# Patient Record
Sex: Female | Born: 1940 | Race: White | Hispanic: No | Smoking: Former smoker
Health system: Southern US, Community
[De-identification: ages and names within clinical notes are randomized; demographics above are authoritative.]

## PROBLEM LIST (undated history)

## (undated) DIAGNOSIS — R001 Bradycardia, unspecified: Secondary | ICD-10-CM

## (undated) DIAGNOSIS — J449 Chronic obstructive pulmonary disease, unspecified: Secondary | ICD-10-CM

## (undated) DIAGNOSIS — Z9861 Coronary angioplasty status: Secondary | ICD-10-CM

## (undated) DIAGNOSIS — I2510 Atherosclerotic heart disease of native coronary artery without angina pectoris: Secondary | ICD-10-CM

## (undated) DIAGNOSIS — I779 Disorder of arteries and arterioles, unspecified: Secondary | ICD-10-CM

## (undated) DIAGNOSIS — E119 Type 2 diabetes mellitus without complications: Secondary | ICD-10-CM

## (undated) DIAGNOSIS — I10 Essential (primary) hypertension: Secondary | ICD-10-CM

## (undated) DIAGNOSIS — E785 Hyperlipidemia, unspecified: Secondary | ICD-10-CM

## (undated) DIAGNOSIS — I739 Peripheral vascular disease, unspecified: Secondary | ICD-10-CM

## (undated) DIAGNOSIS — I429 Cardiomyopathy, unspecified: Secondary | ICD-10-CM

## (undated) DIAGNOSIS — I482 Chronic atrial fibrillation, unspecified: Secondary | ICD-10-CM

## (undated) DIAGNOSIS — E039 Hypothyroidism, unspecified: Secondary | ICD-10-CM

## (undated) HISTORY — DX: Hypothyroidism, unspecified: E03.9

## (undated) HISTORY — DX: Disorder of arteries and arterioles, unspecified: I77.9

## (undated) HISTORY — DX: Type 2 diabetes mellitus without complications: E11.9

## (undated) HISTORY — PX: CHOLECYSTECTOMY: SHX55

## (undated) HISTORY — PX: ABDOMINAL HYSTERECTOMY: SHX81

## (undated) HISTORY — DX: Essential (primary) hypertension: I10

## (undated) HISTORY — DX: Peripheral vascular disease, unspecified: I73.9

## (undated) HISTORY — DX: Hyperlipidemia, unspecified: E78.5

## (undated) HISTORY — DX: Chronic obstructive pulmonary disease, unspecified: J44.9

## (undated) HISTORY — PX: APPENDECTOMY: SHX54

## (undated) HISTORY — DX: Cardiomyopathy, unspecified: I42.9

---

## 2000-07-25 ENCOUNTER — Encounter (INDEPENDENT_AMBULATORY_CARE_PROVIDER_SITE_OTHER): Payer: Self-pay | Admitting: *Deleted

## 2000-07-25 ENCOUNTER — Encounter (INDEPENDENT_AMBULATORY_CARE_PROVIDER_SITE_OTHER): Payer: Self-pay | Admitting: Specialist

## 2000-07-25 ENCOUNTER — Ambulatory Visit (HOSPITAL_COMMUNITY): Admission: RE | Admit: 2000-07-25 | Discharge: 2000-07-25 | Payer: Self-pay | Admitting: Neurosurgery

## 2000-07-25 ENCOUNTER — Encounter: Payer: Self-pay | Admitting: Neurosurgery

## 2003-04-09 ENCOUNTER — Inpatient Hospital Stay (HOSPITAL_COMMUNITY): Admission: AD | Admit: 2003-04-09 | Discharge: 2003-04-11 | Payer: Self-pay | Admitting: Cardiology

## 2004-06-08 ENCOUNTER — Ambulatory Visit: Payer: Self-pay | Admitting: Cardiology

## 2005-01-13 ENCOUNTER — Ambulatory Visit: Payer: Self-pay | Admitting: Cardiology

## 2005-07-20 ENCOUNTER — Ambulatory Visit: Payer: Self-pay | Admitting: Cardiology

## 2006-01-13 ENCOUNTER — Ambulatory Visit: Payer: Self-pay | Admitting: Cardiology

## 2006-08-17 ENCOUNTER — Ambulatory Visit: Payer: Self-pay | Admitting: Cardiology

## 2007-03-27 ENCOUNTER — Ambulatory Visit: Payer: Self-pay | Admitting: Cardiology

## 2007-04-02 ENCOUNTER — Ambulatory Visit: Payer: Self-pay | Admitting: Cardiology

## 2007-05-01 ENCOUNTER — Ambulatory Visit: Payer: Self-pay | Admitting: Cardiology

## 2007-07-31 ENCOUNTER — Encounter: Payer: Self-pay | Admitting: Cardiology

## 2007-11-12 ENCOUNTER — Encounter: Payer: Self-pay | Admitting: Cardiology

## 2008-02-01 ENCOUNTER — Ambulatory Visit: Payer: Self-pay | Admitting: Cardiology

## 2008-09-09 ENCOUNTER — Ambulatory Visit: Payer: Self-pay | Admitting: Cardiology

## 2008-11-20 DIAGNOSIS — Z9861 Coronary angioplasty status: Secondary | ICD-10-CM

## 2008-11-20 DIAGNOSIS — I10 Essential (primary) hypertension: Secondary | ICD-10-CM

## 2008-11-20 DIAGNOSIS — E785 Hyperlipidemia, unspecified: Secondary | ICD-10-CM

## 2008-11-20 DIAGNOSIS — I2510 Atherosclerotic heart disease of native coronary artery without angina pectoris: Secondary | ICD-10-CM

## 2009-03-12 ENCOUNTER — Encounter: Payer: Self-pay | Admitting: Cardiology

## 2009-03-13 ENCOUNTER — Ambulatory Visit: Payer: Self-pay | Admitting: Cardiology

## 2009-03-13 ENCOUNTER — Encounter: Payer: Self-pay | Admitting: Cardiology

## 2009-03-17 ENCOUNTER — Encounter (INDEPENDENT_AMBULATORY_CARE_PROVIDER_SITE_OTHER): Payer: Self-pay | Admitting: *Deleted

## 2009-03-17 DIAGNOSIS — R002 Palpitations: Secondary | ICD-10-CM

## 2009-03-17 DIAGNOSIS — R072 Precordial pain: Secondary | ICD-10-CM | POA: Insufficient documentation

## 2009-04-01 ENCOUNTER — Ambulatory Visit: Payer: Self-pay | Admitting: Cardiology

## 2009-04-02 ENCOUNTER — Encounter: Payer: Self-pay | Admitting: Cardiology

## 2009-04-09 ENCOUNTER — Ambulatory Visit: Payer: Self-pay | Admitting: Cardiology

## 2009-04-09 DIAGNOSIS — I6529 Occlusion and stenosis of unspecified carotid artery: Secondary | ICD-10-CM

## 2009-04-09 DIAGNOSIS — J441 Chronic obstructive pulmonary disease with (acute) exacerbation: Secondary | ICD-10-CM

## 2009-04-09 DIAGNOSIS — F17200 Nicotine dependence, unspecified, uncomplicated: Secondary | ICD-10-CM

## 2009-06-29 ENCOUNTER — Encounter: Payer: Self-pay | Admitting: Cardiology

## 2009-06-29 ENCOUNTER — Ambulatory Visit: Payer: Self-pay | Admitting: Cardiology

## 2009-06-30 ENCOUNTER — Encounter: Payer: Self-pay | Admitting: Cardiology

## 2009-07-17 ENCOUNTER — Ambulatory Visit: Payer: Self-pay | Admitting: Cardiology

## 2009-08-10 ENCOUNTER — Encounter: Payer: Self-pay | Admitting: Physician Assistant

## 2009-08-10 ENCOUNTER — Encounter: Payer: Self-pay | Admitting: Cardiology

## 2009-08-10 ENCOUNTER — Ambulatory Visit: Payer: Self-pay | Admitting: Cardiology

## 2009-08-27 ENCOUNTER — Ambulatory Visit: Payer: Self-pay | Admitting: Cardiology

## 2010-01-14 ENCOUNTER — Ambulatory Visit: Payer: Self-pay | Admitting: Cardiology

## 2010-03-30 NOTE — Assessment & Plan Note (Signed)
Summary: 6 MO FUL FHOLT   Visit Type:  Follow-up Primary Provider:  Sherril Croon   History of Present Illness: the patient is a 70 year old female with a history paroxysmal atrial fibrillation currently normal sinus rhythm. The patient has declined Coumadin in the past despite the fact that she such high risk for trauma while in disease. She presents for followup.the patient also has a history of COPD. No palpitations. lumgs improved. Tobacco 8 cigrattes. No dizziness, no syncope.feels BP goes stress related.ho CAD sent 2005 but declines stress test. Declines again coumadin. Walking does causes thightness. Uses inhaler feels related to lungs.  Preventive Screening-Counseling & Management  Alcohol-Tobacco     Smoking Status: current     Smoking Cessation Counseling: yes     Packs/Day: 1/2 PPD  Current Medications (verified): 1)  Crestor 10 Mg Tabs (Rosuvastatin Calcium) .... Take 1 Tablet By Mouth At Bedtime 2)  Potassium Chloride Crys Cr 20 Meq Cr-Tabs (Potassium Chloride Crys Cr) .... Take 1 Tablet By Mouth Once A Day 3)  Hydrochlorothiazide 12.5 Mg Tabs (Hydrochlorothiazide) .... Take 1 Tablet By Mouth Once A Day 4)  Isosorbide Mononitrate Cr 30 Mg Xr24h-Tab (Isosorbide Mononitrate) .... Take 1 Tablet By Mouth Once A Day 5)  Cardizem Cd 240 Mg Xr24h-Cap (Diltiazem Hcl Coated Beads) .... Take 1 Tablet By Mouth Once A Day 6)  Levothroid 100 Mcg Tabs (Levothyroxine Sodium) .... Take 1 Tablet By Mouth Once A Day 7)  Lisinopril 40 Mg Tabs (Lisinopril) .... Take 1 Tablet By Mouth Once A Day 8)  Aspirin 325 Mg Tabs (Aspirin) .... Take 1 Tablet By Mouth Once A Day 9)  Proair Hfa 108 (90 Base) Mcg/act Aers (Albuterol Sulfate) .... As Needed 10)  Glipizide 5 Mg Tabs (Glipizide) .... Take 1 Tablet By Mouth Once A Day 11)  Omeprazole 40 Mg Cpdr (Omeprazole) .... Take 1 Tablet By Mouth Once A Day 12)  Diazepam 5 Mg Tabs (Diazepam) .... Take 1/2-1 Tablet By Mouth Three Times A Day As Needed 13)  Albuterol  Sulfate (2.5 Mg/63ml) 0.083% Nebu (Albuterol Sulfate) .... Use As Directed 14)  Caltrate 600+d 600-400 Mg-Unit Tabs (Calcium Carbonate-Vitamin D) .... Take 1 Tablet By Mouth Two Times A Day 15)  Hydrocodone-Acetaminophen 5-500 Mg Tabs (Hydrocodone-Acetaminophen) .... Take 1 Tablet By Mouth Two Times A Day As Needed 16)  Ipratropium Bromide 0.02 % Soln (Ipratropium Bromide) .... Use As Directed 17)  Nitrostat 0.4 Mg Subl (Nitroglycerin) .... Use As Directed 18)  Drisdol [address] Unit Caps (Ergocalciferol) .... Take One By Mouth Weekly  Allergies (verified): 1)  ! Codeine  Comments:  Nurse/Medical Assistant: The patient's medication bottles and allergies were reviewed with the patient and were updated in the Medication and Allergy Lists.  Past History:  Past Medical History: Last updated: 07/17/2009 HYPERTENSION, UNSPECIFIED (ICD-401.9) HYPERLIPIDEMIA-MIXED (ICD-272.4) CAD, NATIVE VESSEL (ICD-414.01) Cerebrovascular disease Hypothyroidism COPD drug alluding stent to the right coronary February 2005., normal LV function. History of atrial fibrillation and atrial flutter  Family History: Last updated: 01/14/2010 noncontributory Negative FH of Diabetes, Hypertension, or Coronary Artery Disease  Family History: noncontributory Negative FH of Diabetes, Hypertension, or Coronary Artery Disease  Review of Systems       The patient complains of shortness of breath and prolonged cough.  The patient denies fatigue, malaise, fever, weight gain/loss, vision loss, decreased hearing, hoarseness, chest pain, palpitations, wheezing, sleep apnea, coughing up blood, abdominal pain, blood in stool, nausea, vomiting, diarrhea, heartburn, incontinence, blood in urine, muscle weakness, joint pain, leg swelling,  rash, skin lesions, headache, fainting, dizziness, depression, anxiety, enlarged lymph nodes, easy bruising or bleeding, and environmental allergies.    Vital Signs:  Patient profile:   70 year  old female Height:      62 inches Weight:      158 pounds BMI:     29.00 Pulse rate:   66 / minute BP sitting:   112 / 64  (left arm) Cuff size:   regular  Vitals Entered By: Carlye Grippe (January 14, 2010 1:48 PM)  Nutrition Counseling: Patient's BMI is greater than 25 and therefore counseled on weight management options.   Physical Exam  Additional Exam:  General: Well-developed, well-nourished in no distress head: Normocephalic and atraumatic eyes PERRLA/EOMI intact, conjunctiva and lids normal nose: No deformity or lesions mouth normal dentition, normal posterior pharynx neck: Supple, no JVD.  No masses, thyromegaly or abnormal cervical nodes lungs: diminished breath sounds bilateral wheezing..  Normal percussion heart: regular rate and rhythm with normal S1 and S2, no S3 or S4.  PMI is normal.  No pathological murmurs abdomen: Normal bowel sounds, abdomen is soft and nontender without masses, organomegaly or hernias noted.  No hepatosplenomegaly musculoskeletal: Back normal, normal gait muscle strength and tone normal pulsus: Pulse is normal in all 4 extremities Extremities: No peripheral pitting edema neurologic: Alert and oriented x 3 skin: Intact without lesions or rashes cervical nodes: No significant adenopathy psychologic: Normal affect    Impression & Recommendations:  Problem # 1:  ATRIAL FIBRILLATION (ICD-427.31) patient has a history of proximal intrafibrillation.  She is currently normal sinus rhythm.  She continues to decline Coumadin despite high risk features. Her updated medication list for this problem includes:    Aspirin 325 Mg Tabs (Aspirin) .Marland Kitchen... Take 1 tablet by mouth once a day  Orders: EKG w/ Interpretation (93000)  Problem # 2:  CAD, NATIVE VESSEL (ICD-414.01) history of coronary artery disease the patient declines a follow-up stress test.  She has stable symptoms however. Her updated medication list for this problem includes:     Isosorbide Mononitrate Cr 30 Mg Xr24h-tab (Isosorbide mononitrate) .Marland Kitchen... Take 1 tablet by mouth once a day    Cardizem Cd 240 Mg Xr24h-cap (Diltiazem hcl coated beads) .Marland Kitchen... Take 1 tablet by mouth once a day    Lisinopril 40 Mg Tabs (Lisinopril) .Marland Kitchen... Take 1 tablet by mouth once a day    Aspirin 325 Mg Tabs (Aspirin) .Marland Kitchen... Take 1 tablet by mouth once a day    Nitrostat 0.4 Mg Subl (Nitroglycerin) ..... Use as directed  Problem # 3:  COPD (ICD-496) patient has significant dyspnea for which he uses inhalers. The following medications were removed from the medication list:    Albuterol Sulfate (5 Mg/ml) 0.5% Nebu (Albuterol sulfate) .Marland Kitchen... As needed Her updated medication list for this problem includes:    Proair Hfa 108 (90 Base) Mcg/act Aers (Albuterol sulfate) .Marland Kitchen... As needed    Albuterol Sulfate (2.5 Mg/5ml) 0.083% Nebu (Albuterol sulfate) ..... Use as directed    Ipratropium Bromide 0.02 % Soln (Ipratropium bromide) ..... Use as directed  Patient Instructions: 1)  Your physician recommends that you continue on your current medications as directed. Please refer to the Current Medication list given to you today. 2)  Follow up in  6 months

## 2010-03-30 NOTE — Letter (Signed)
Summary: [facility] D/C DR. DHRUV VYAS  [facility] D[facility]C DR. DHRUV VYAS   Imported By: Zachary George 07/16/2009 12:44:38  _____________________________________________________________________  External Attachment:    Type:   Image     Comment:   External Document

## 2010-03-30 NOTE — Assessment & Plan Note (Signed)
Summary: 6 MO FU PER JAN REMINDERS-RS   Visit Type:  Follow-up Primary Provider:  Sherril Croon  CC:  follow-up visit.  History of Present Illness: the patient is a 70 year old female with a history of coronary disease status post drug-eluting stent placement to the RCA 2005. The patient continues to use tobacco. She also has carotid artery disease. She has COPD and a significant wheezing on exam today. The patient declined a stress test her last office visit. She denies any chest pain shortness of breath or syncope she reports no palpitations. The patient wore a 21 day monitor and there was no evidence of atrial fibrillation.   Preventive Screening-Counseling & Management  Alcohol-Tobacco     Smoking Status: current     Smoking Cessation Counseling: yes     Packs/Day: 1/2 PPD  Current Problems (verified): 1)  Chest Pain, Precordial  (ICD-786.51) 2)  Palpitations  (ICD-785.1) 3)  Hypertension, Unspecified  (ICD-401.9) 4)  Hyperlipidemia-mixed  (ICD-272.4) 5)  Cad, Native Vessel  (ICD-414.01)  Current Medications (verified): 1)  Crestor 10 Mg Tabs (Rosuvastatin Calcium) .... Take 1 Tablet By Mouth At Bedtime 2)  Potassium Chloride Crys Cr 20 Meq Cr-Tabs (Potassium Chloride Crys Cr) .... Take 1 Tablet By Mouth Once A Day 3)  Hydrochlorothiazide 12.5 Mg Tabs (Hydrochlorothiazide) .... Take 1 Tablet By Mouth Once A Day 4)  Isosorbide Mononitrate Cr 30 Mg Xr24h-Tab (Isosorbide Mononitrate) .... Take 1 Tablet By Mouth Once A Day 5)  Cardizem Cd 240 Mg Xr24h-Cap (Diltiazem Hcl Coated Beads) .... Take 1 Tablet By Mouth Once A Day 6)  Levothroid 100 Mcg Tabs (Levothyroxine Sodium) .... Take 1 Tablet By Mouth Once A Day 7)  Lisinopril 40 Mg Tabs (Lisinopril) .... Take 1 Tablet By Mouth Once A Day 8)  Aspir-Low 81 Mg Tbec (Aspirin) .... Take 1 Tablet By Mouth Once A Day 9)  Proventil Hfa 108 (90 Base) Mcg/act Aers (Albuterol Sulfate) .... As Needed 10)  Glipizide 5 Mg Tabs (Glipizide) .... Take 1  Tablet By Mouth Once A Day 11)  Oxazepam 10 Mg Caps (Oxazepam) .... Take 1 Tablet By Mouth Once A Day As Needed 12)  Omeprazole 40 Mg Cpdr (Omeprazole) .... Take 1 Tablet By Mouth Once A Day 13)  Diazepam 5 Mg Tabs (Diazepam) .... Take 1/2-1 Tablet By Mouth Three Times A Day As Needed 14)  Nicotine 21 Mg/24hr Pt24 (Nicotine) .... Apply One Patch Daily X 7 Days, Then Step Down To 14mg  Patch 15)  Nicotine 14 Mg/24hr Pt24 (Nicotine) .... Apply One Patch Daily X 7 Days, Then Step Down To 7mg  16)  Nicotine 7 Mg/24hr Pt24 (Nicotine) .... Apply One Patch Daily, Then Stop  Allergies (verified): 1)  ! Codeine  Comments:  Nurse/Medical Assistant: The patient's medications and allergies were reviewed with the patient and were updated in the Medication and Allergy Lists. Bottles reviewed.  Past History:  Past Medical History: Last updated: 11/20/2008 HYPERTENSION, UNSPECIFIED (ICD-401.9) HYPERLIPIDEMIA-MIXED (ICD-272.4) CAD, NATIVE VESSEL (ICD-414.01) Cerebrovascular disease Hypothyroidism COPD  Family History: Reviewed history and no changes required. noncontributory  Social History: Reviewed history from 11/20/2008 and no changes required. Tobacco Use - Yes.  Packs/Day:  1/2 PPD  Review of Systems       The patient complains of wheezing.  The patient denies fatigue, malaise, fever, weight gain/loss, vision loss, decreased hearing, hoarseness, chest pain, palpitations, prolonged cough, sleep apnea, coughing up blood, abdominal pain, blood in stool, nausea, vomiting, diarrhea, heartburn, incontinence, blood in urine, muscle weakness, joint pain,  leg swelling, rash, skin lesions, headache, fainting, dizziness, depression, anxiety, enlarged lymph nodes, easy bruising or bleeding, and environmental allergies.    Vital Signs:  Patient profile:   70 year old female Height:      62 inches Weight:      152 pounds BMI:     27.90 O2 Sat:      94 % Pulse rate:   61 / minute BP sitting:    113 / 68  (left arm) Cuff size:   regular  Vitals Entered By: Carlye Grippe (April 09, 2009 1:24 PM)  Nutrition Counseling: Patient's BMI is greater than 25 and therefore counseled on weight management options. CC: follow-up visit   Physical Exam  Additional Exam:  General: Well-developed, well-nourished in no distress head: Normocephalic and atraumatic eyes PERRLA/EOMI intact, conjunctiva and lids normal nose: No deformity or lesions mouth normal dentition, normal posterior pharynx neck: Supple, no JVD.  No masses, thyromegaly or abnormal cervical nodes lungs: diminished breath sounds bilateral wheezing..  Normal percussion heart: regular rate and rhythm with normal S1 and S2, no S3 or S4.  PMI is normal.  No pathological murmurs abdomen: Normal bowel sounds, abdomen is soft and nontender without masses, organomegaly or hernias noted.  No hepatosplenomegaly musculoskeletal: Back normal, normal gait muscle strength and tone normal pulsus: Pulse is normal in all 4 extremities Extremities: No peripheral pitting edema neurologic: Alert and oriented x 3 skin: Intact without lesions or rashes cervical nodes: No significant adenopathy psychologic: Normal affect    Impression & Recommendations:  Problem # 1:  CAD, NATIVE VESSEL (ICD-414.01) the patient denies any chest pain. The patient remains compliant with her medical regimen. She has declined a stress test. Her updated medication list for this problem includes:    Isosorbide Mononitrate Cr 30 Mg Xr24h-tab (Isosorbide mononitrate) .Marland Kitchen... Take 1 tablet by mouth once a day    Cardizem Cd 240 Mg Xr24h-cap (Diltiazem hcl coated beads) .Marland Kitchen... Take 1 tablet by mouth once a day    Lisinopril 40 Mg Tabs (Lisinopril) .Marland Kitchen... Take 1 tablet by mouth once a day    Aspir-low 81 Mg Tbec (Aspirin) .Marland Kitchen... Take 1 tablet by mouth once a day  Problem # 2:  TOBACCO ABUSE (ICD-305.1) the patient's abdominal smoking to 7-8 cigarettes a day. I counseled  her extensively regarding discontinuation. The patient is willing to try nicotine patches. I have given her prescription.  Problem # 3:  CAROTID ARTERY DISEASE (ICD-433.10) no definite bruits on exam. I do not think Dopplers are indicated currently Her updated medication list for this problem includes:    Aspir-low 81 Mg Tbec (Aspirin) .Marland Kitchen... Take 1 tablet by mouth once a day  Problem # 4:  COPD (ICD-496) the patient has significant wheezing on exam. Again I counseled her regarding her tobacco use and asked her to use her inhalers. Her updated medication list for this problem includes:    Proventil Hfa 108 (90 Base) Mcg/act Aers (Albuterol sulfate) .Marland Kitchen... As needed  Patient Instructions: 1)  Nicotine patch 2)  Follow up in  6 months. Prescriptions: NICOTINE 7 MG/24HR PT24 (NICOTINE) apply one patch daily, then stop  #7 x 0   Entered by:   Hoover Brunette, LPN   Authorized by:   Lewayne Bunting, MD, [facility]   Signed by:   Hoover Brunette, LPN on 41/32/4401   Method used:   Electronically to        [facility]* (retail)       [address]  [address]       [place], [place]  [address]       Ph: [phone]       Fax: [phone]   RxID:   [id number] NICOTINE 14 MG/24HR PT24 (NICOTINE) apply one patch daily x 7 days, then step down to 7mg   #7 x 0   Entered by:   Hoover Brunette, LPN   Authorized by:   Lewayne Bunting, MD, [facility]   Signed by:   Hoover Brunette, LPN on 84/13/2440   Method used:   Electronically to        [facility]* (retail)       [address]       [place], [place]  [address]       Ph: [phone]       Fax: [phone]   RxID:   [id number] NICOTINE 21 MG/24HR PT24 (NICOTINE) apply one patch daily x 7 days, then step down to 14mg  patch  #7 x 0   Entered by:   Hoover Brunette, LPN   Authorized by:   Lewayne Bunting, MD, [facility]   Signed by:   Hoover Brunette, LPN on 30/16/0109   Method used:   Electronically to        [facility]* (retail)       [address]       [place], [place]  [address]       Ph: [phone]       Fax: [phone]   RxID:   [id number]

## 2010-03-30 NOTE — Consult Note (Signed)
Summary: [facility]ARDIOLOGY CONSULT/ [facility]  CARDIOLOGY CONSULT/ [facility]   Imported By: Zachary George 07/16/2009 12:44:12  _____________________________________________________________________  External Attachment:    Type:   Image     Comment:   External Document

## 2010-03-30 NOTE — Letter (Signed)
Summary: [facility] H&P/D/C DR. VYAS  [facility] H[facility]/D/C DR. VYAS   Imported By: Zachary George 04/09/2009 11:54:32  _____________________________________________________________________  External Attachment:    Type:   Image     Comment:   External Document

## 2010-03-30 NOTE — Assessment & Plan Note (Signed)
Summary: eph-d/c [facility] 06/30/2009   Visit Type:  hospital follow-up Primary Provider:  Sherril Croon  CC:  hospital follow-up visit.  History of Present Illness: the patient is a 70 year old female recently admitted with chest pain and palpitations. She was found to have a recurrent episode of paroxysmal atrial fibrillation. During hospitalization she was back restored to normal sinus rhythm. She has a history of cardiovascular disease with a drug-eluting stent to the right coronary artery in 2005. She has consistently declined noninvasive testing. She ruled out for myocardial infarction during this hospitalization. In the interim she has had no recurrent chest pain or heart palpitations. An echocardiogram demonstrated normal LV function, concentric left ventricular hypertrophy but no significant valvular abnormalities. The patient also has hypertension, dyslipidemia and diabetes mellitus. She has significant COPD and unfortunately continues to smoke.  The patient is at increased risk for thrombo-embolic disease related to her age her fibrillation. We had a long discussion regarding anticoagulation with Coumadin for dabigatran. The patient is not willing to start it at the present time but will consider the latter medication in the next 6 months. She again today declines noninvasive testing to evaluate her ischemic heart disease.  Preventive Screening-Counseling & Management  Alcohol-Tobacco     Smoking Status: current     Smoking Cessation Counseling: yes     Packs/Day: 1/2 PPD  Current Medications (verified): 1)  Crestor 10 Mg Tabs (Rosuvastatin Calcium) .... Take 1 Tablet By Mouth At Bedtime 2)  Potassium Chloride Crys Cr 20 Meq Cr-Tabs (Potassium Chloride Crys Cr) .... Take 1 Tablet By Mouth Once A Day 3)  Hydrochlorothiazide 12.5 Mg Tabs (Hydrochlorothiazide) .... Take 1 Tablet By Mouth Once A Day 4)  Isosorbide Mononitrate Cr 30 Mg Xr24h-Tab (Isosorbide Mononitrate) .... Take 1 Tablet By Mouth Once  A Day 5)  Cardizem Cd 240 Mg Xr24h-Cap (Diltiazem Hcl Coated Beads) .... Take 1 Tablet By Mouth Once A Day 6)  Levothroid 100 Mcg Tabs (Levothyroxine Sodium) .... Take 1 Tablet By Mouth Once A Day 7)  Lisinopril 40 Mg Tabs (Lisinopril) .... Take 1 Tablet By Mouth Once A Day 8)  Aspir-Low 81 Mg Tbec (Aspirin) .... Take 1 Tablet By Mouth Once A Day 9)  Proventil Hfa 108 (90 Base) Mcg/act Aers (Albuterol Sulfate) .... As Needed 10)  Glipizide 5 Mg Tabs (Glipizide) .... Take 1 Tablet By Mouth Once A Day 11)  Omeprazole 40 Mg Cpdr (Omeprazole) .... Take 1 Tablet By Mouth Once A Day 12)  Diazepam 5 Mg Tabs (Diazepam) .... Take 1/2-1 Tablet By Mouth Three Times A Day As Needed 13)  Nicotine 21 Mg/24hr Pt24 (Nicotine) .... Apply One Patch Daily X 7 Days, Then Step Down To 14mg  Patch 14)  Nicotine 14 Mg/24hr Pt24 (Nicotine) .... Apply One Patch Daily X 7 Days, Then Step Down To 7mg  15)  Nicotine 7 Mg/24hr Pt24 (Nicotine) .... Apply One Patch Daily, Then Stop 16)  Pulmo-Aide Comp/pulmo-Neb Disp  Devi (Respiratory Therapy Supplies) .... Use As Directed 17)  Albuterol Sulfate (2.5 Mg/102ml) 0.083% Nebu (Albuterol Sulfate) .... Use As Directed 18)  Caltrate 600+d 600-400 Mg-Unit Tabs (Calcium Carbonate-Vitamin D) .... Take 1 Tablet By Mouth Two Times A Day 19)  Hydrocodone-Acetaminophen 5-500 Mg Tabs (Hydrocodone-Acetaminophen) .... Take 1 Tablet By Mouth Two Times A Day As Needed 20)  Ipratropium Bromide 0.02 % Soln (Ipratropium Bromide) .... Use As Directed 21)  Nitrostat 0.4 Mg Subl (Nitroglycerin) .... Use As Directed  Allergies (verified): 1)  ! Codeine  Comments:  Nurse/Medical  Assistant: The patient's medications and allergies were reviewed with the patient and were updated in the Medication and Allergy Lists. List reviewed.  Past History:  Family History: Last updated: 04/09/2009 noncontributory  Social History: Last updated: 11/20/2008 Tobacco Use - Yes.   Past Medical  History: HYPERTENSION, UNSPECIFIED (ICD-401.9) HYPERLIPIDEMIA-MIXED (ICD-272.4) CAD, NATIVE VESSEL (ICD-414.01) Cerebrovascular disease Hypothyroidism COPD drug alluding stent to the right coronary February 2005., normal LV function. History of atrial fibrillation and atrial flutter  Review of Systems       The patient complains of palpitations.  The patient denies fatigue, malaise, fever, weight gain/loss, vision loss, decreased hearing, hoarseness, chest pain, shortness of breath, prolonged cough, wheezing, sleep apnea, coughing up blood, abdominal pain, blood in stool, nausea, vomiting, diarrhea, heartburn, incontinence, blood in urine, muscle weakness, joint pain, leg swelling, rash, skin lesions, headache, fainting, dizziness, depression, anxiety, enlarged lymph nodes, easy bruising or bleeding, and environmental allergies.    Vital Signs:  Patient profile:   70 year old female Height:      62 inches Weight:      157 pounds Pulse rate:   58 / minute BP sitting:   121 / 71  (left arm) Cuff size:   regular  Vitals Entered By: Carlye Grippe (Jul 17, 2009 8:25 AM) CC: hospital follow-up visit   Physical Exam  Additional Exam:  General: Well-developed, well-nourished in no distress head: Normocephalic and atraumatic eyes PERRLA/EOMI intact, conjunctiva and lids normal nose: No deformity or lesions mouth normal dentition, normal posterior pharynx neck: Supple, no JVD.  No masses, thyromegaly or abnormal cervical nodes lungs: diminished breath sounds bilateral wheezing..  Normal percussion heart: regular rate and rhythm with normal S1 and S2, no S3 or S4.  PMI is normal.  No pathological murmurs abdomen: Normal bowel sounds, abdomen is soft and nontender without masses, organomegaly or hernias noted.  No hepatosplenomegaly musculoskeletal: Back normal, normal gait muscle strength and tone normal pulsus: Pulse is normal in all 4 extremities Extremities: No peripheral pitting  edema neurologic: Alert and oriented x 3 skin: Intact without lesions or rashes cervical nodes: No significant adenopathy psychologic: Normal affect    Impression & Recommendations:  Problem # 1:  ATRIAL FIBRILLATION (ICD-427.31) patient was recently hospitalized with recurrent intrafibrillation.  She now remains in normal sinus rhythm.  She is at increased risk for thromboembolic disease.  She currently wants to hold off on Coumadin but is willing to consider doing next clinic visit.  I told her at increased risk for stroke and she should consider starting sooner. Her updated medication list for this problem includes:    Aspir-low 81 Mg Tbec (Aspirin) .Marland Kitchen... Take 1 tablet by mouth once a day  Problem # 2:  CAROTID ARTERY DISEASE (ICD-433.10) Assessment: Comment Only  Her updated medication list for this problem includes:    Aspir-low 81 Mg Tbec (Aspirin) .Marland Kitchen... Take 1 tablet by mouth once a day  Problem # 3:  TOBACCO ABUSE (ICD-305.1) the patient was asked regarding her tobacco use.  She still smokes half a pack a day.  Problem # 4:  CAD, NATIVE VESSEL (ICD-414.01) the patient has coronary artery disease but she ruled out during his recent hospitalization for myocardial infarction.  She status-post stent placement.  She denies however any chest pain. Her updated medication list for this problem includes:    Isosorbide Mononitrate Cr 30 Mg Xr24h-tab (Isosorbide mononitrate) .Marland Kitchen... Take 1 tablet by mouth once a day    Cardizem Cd 240 Mg Xr24h-cap (  Diltiazem hcl coated beads) .Marland Kitchen... Take 1 tablet by mouth once a day    Lisinopril 40 Mg Tabs (Lisinopril) .Marland Kitchen... Take 1 tablet by mouth once a day    Aspir-low 81 Mg Tbec (Aspirin) .Marland Kitchen... Take 1 tablet by mouth once a day    Nitrostat 0.4 Mg Subl (Nitroglycerin) ..... Use as directed  Patient Instructions: 1)  Your physician recommends that you continue on your current medications as directed. Please refer to the Current Medication list given to  you today. 2)  Follow up in  6 months

## 2010-03-30 NOTE — Procedures (Signed)
Summary: Holter and Event/ [facility] END OF SERVICE SUMMARY REPORT  Holter and Event/ [facility] END OF SERVICE SUMMARY REPORT   Imported By: Dorise Hiss 04/09/2009 14:10:09  _____________________________________________________________________  External Attachment:    Type:   Image     Comment:   External Document

## 2010-03-30 NOTE — Assessment & Plan Note (Signed)
Summary: EPH   Visit Type:  hospital follow-up Primary Provider:  Vyas   History of Present Illness: the patient was recently hospitalized withatrial fibrillation with rapid ventricular response after she took allergy medications with pseudoephedrine. She was only briefly hospitalized in conference and spontaneously back to normal sinus rhythm. She is back on her usual medications. She's had no recurrent palpitations she denies any chest pain orthopnea PND. She has no palpitations or syncope. Unfortunately she continues to smoke.  Despite her history of atrial fibrillation the patient continues to decline Coumadin and she also does not want any stress testing at the present time.  Preventive Screening-Counseling & Management  Alcohol-Tobacco     Smoking Status: current     Smoking Cessation Counseling: yes     Packs/Day: 1/2 PPD  Current Medications (verified): 1)  Crestor 10 Mg Tabs (Rosuvastatin Calcium) .... Take 1 Tablet By Mouth At Bedtime 2)  Potassium Chloride Crys Cr 20 Meq Cr-Tabs (Potassium Chloride Crys Cr) .... Take 1 Tablet By Mouth Once A Day 3)  Hydrochlorothiazide 12.5 Mg Tabs (Hydrochlorothiazide) .... Take 1 Tablet By Mouth Once A Day 4)  Isosorbide Mononitrate Cr 30 Mg Xr24h-Tab (Isosorbide Mononitrate) .... Take 1 Tablet By Mouth Once A Day 5)  Cardizem Cd 240 Mg Xr24h-Cap (Diltiazem Hcl Coated Beads) .... Take 1 Tablet By Mouth Once A Day 6)  Levothroid 100 Mcg Tabs (Levothyroxine Sodium) .... Take 1 Tablet By Mouth Once A Day 7)  Lisinopril 40 Mg Tabs (Lisinopril) .... Take 1 Tablet By Mouth Once A Day 8)  Aspirin 325 Mg Tabs (Aspirin) .... Take 1 Tablet By Mouth Once A Day 9)  Proair Hfa 108 (90 Base) Mcg/act Aers (Albuterol Sulfate) .... As Needed 10)  Glipizide 5 Mg Tabs (Glipizide) .... Take 1 Tablet By Mouth Once A Day 11)  Omeprazole 40 Mg Cpdr (Omeprazole) .... Take 1 Tablet By Mouth Once A Day 12)  Diazepam 5 Mg Tabs (Diazepam) .... Take 1/2-1 Tablet By  Mouth Three Times A Day As Needed 13)  Nicotine 21 Mg/24hr Pt24 (Nicotine) .... Apply One Patch Daily X 7 Days, Then Step Down To 14mg  Patch 14)  Nicotine 14 Mg/24hr Pt24 (Nicotine) .... Apply One Patch Daily X 7 Days, Then Step Down To 7mg  15)  Nicotine 7 Mg/24hr Pt24 (Nicotine) .... Apply One Patch Daily, Then Stop 16)  Pulmo-Aide Comp/pulmo-Neb Disp  Devi (Respiratory Therapy Supplies) .... Use As Directed 17)  Albuterol Sulfate (2.5 Mg/52ml) 0.083% Nebu (Albuterol Sulfate) .... Use As Directed 18)  Caltrate 600+d 600-400 Mg-Unit Tabs (Calcium Carbonate-Vitamin D) .... Take 1 Tablet By Mouth Two Times A Day 19)  Hydrocodone-Acetaminophen 5-500 Mg Tabs (Hydrocodone-Acetaminophen) .... Take 1 Tablet By Mouth Two Times A Day As Needed 20)  Ipratropium Bromide 0.02 % Soln (Ipratropium Bromide) .... Use As Directed 21)  Nitrostat 0.4 Mg Subl (Nitroglycerin) .... Use As Directed 22)  Albuterol Sulfate (5 Mg/ml) 0.5% Nebu (Albuterol Sulfate) .... As Needed  Allergies (verified): 1)  ! Codeine  Comments:  Nurse/Medical Assistant: The patient's medication list and allergies were reviewed with the patient and were updated in the Medication and Allergy Lists.  Past History:  Past Medical History: Last updated: 07/17/2009 HYPERTENSION, UNSPECIFIED (ICD-401.9) HYPERLIPIDEMIA-MIXED (ICD-272.4) CAD, NATIVE VESSEL (ICD-414.01) Cerebrovascular disease Hypothyroidism COPD drug alluding stent to the right coronary February 2005., normal LV function. History of atrial fibrillation and atrial flutter  Family History: Last updated: 04/09/2009 noncontributory  Social History: Last updated: 11/20/2008 Tobacco Use - Yes.  Risk Factors: Smoking Status: current (08/27/2009) Packs/Day: 1/2 PPD (08/27/2009)  Review of Systems       The patient complains of shortness of breath, prolonged cough, and wheezing.  The patient denies fatigue, malaise, fever, weight gain/loss, vision loss, decreased  hearing, hoarseness, chest pain, palpitations, sleep apnea, coughing up blood, abdominal pain, blood in stool, nausea, vomiting, diarrhea, heartburn, incontinence, blood in urine, muscle weakness, joint pain, leg swelling, rash, skin lesions, headache, fainting, dizziness, depression, anxiety, enlarged lymph nodes, easy bruising or bleeding, and environmental allergies.    Vital Signs:  Patient profile:   70 year old female Height:      62 inches Weight:      157 pounds Pulse rate:   64 / minute BP sitting:   133 / 75  (left arm) Cuff size:   regular  Vitals Entered By: Carlye Grippe (August 27, 2009 10:14 AM)  Physical Exam  Additional Exam:  General: Well-developed, well-nourished in no distress head: Normocephalic and atraumatic eyes PERRLA/EOMI intact, conjunctiva and lids normal nose: No deformity or lesions mouth normal dentition, normal posterior pharynx neck: Supple, no JVD.  No masses, thyromegaly or abnormal cervical nodes lungs: diminished breath sounds bilateral wheezing..  Normal percussion heart: regular rate and rhythm with normal S1 and S2, no S3 or S4.  PMI is normal.  No pathological murmurs abdomen: Normal bowel sounds, abdomen is soft and nontender without masses, organomegaly or hernias noted.  No hepatosplenomegaly musculoskeletal: Back normal, normal gait muscle strength and tone normal pulsus: Pulse is normal in all 4 extremities Extremities: No peripheral pitting edema neurologic: Alert and oriented x 3 skin: Intact without lesions or rashes cervical nodes: No significant adenopathy psychologic: Normal affect    Impression & Recommendations:  Problem # 1:  ATRIAL FIBRILLATION (ICD-427.31) patient back in normal sinus rhythm she declines Coumadin. I explained to her that she stop high-risk for cardioembolic disease. I also told her not to take any pseudoephedrine anymore but only to use second or third generation antihistamine drugs Her updated medication  list for this problem includes:    Aspirin 325 Mg Tabs (Aspirin) .Marland Kitchen... Take 1 tablet by mouth once a day  Orders: EKG w/ Interpretation (93000)  Problem # 2:  COPD (ICD-496)  patient continues to smoke and I counseled her about this. Her updated medication list for this problem includes:    Proair Hfa 108 (90 Base) Mcg/act Aers (Albuterol sulfate) .Marland Kitchen... As needed    Albuterol Sulfate (2.5 Mg/65ml) 0.083% Nebu (Albuterol sulfate) ..... Use as directed    Ipratropium Bromide 0.02 % Soln (Ipratropium bromide) ..... Use as directed    Albuterol Sulfate (5 Mg/ml) 0.5% Nebu (Albuterol sulfate) .Marland Kitchen... As needed  Her updated medication list for this problem includes:    Proair Hfa 108 (90 Base) Mcg/act Aers (Albuterol sulfate) .Marland Kitchen... As needed    Albuterol Sulfate (2.5 Mg/47ml) 0.083% Nebu (Albuterol sulfate) ..... Use as directed    Ipratropium Bromide 0.02 % Soln (Ipratropium bromide) ..... Use as directed    Albuterol Sulfate (5 Mg/ml) 0.5% Nebu (Albuterol sulfate) .Marland Kitchen... As needed  Problem # 3:  TOBACCO ABUSE (ICD-305.1) Assessment: Comment Only  Patient Instructions: 1)  Your physician recommends that you continue on your current medications as directed. Please refer to the Current Medication list given to you today. 2)  Follow up in  as planned.

## 2010-03-30 NOTE — Consult Note (Signed)
Summary: CARDIOLOGY CONSULT/ [facility]  CARDIOLOGY CONSULT/ [facility]   Imported By: Zachary George 08/26/2009 18:41:41  _____________________________________________________________________  External Attachment:    Type:   Image     Comment:   External Document

## 2010-03-30 NOTE — Consult Note (Signed)
Summary: CARDIOLOGY CONSULT/ [facility]  CARDIOLOGY CONSULT/ [facility]   Imported By: Zachary George 04/09/2009 11:52:46  _____________________________________________________________________  External Attachment:    Type:   Image     Comment:   External Document

## 2010-03-30 NOTE — Miscellaneous (Signed)
Summary: Orders Update  Clinical Lists Changes  Problems: Added new problem of PALPITATIONS (ICD-785.1) Added new problem of CHEST PAIN, PRECORDIAL (ICD-786.51) Orders: Added new Referral order of Cardionet/Event Monitor (Cardionet/Event) - Signed

## 2010-04-29 HISTORY — PX: OTHER SURGICAL HISTORY: SHX169

## 2010-05-22 ENCOUNTER — Encounter: Payer: Self-pay | Admitting: Cardiology

## 2010-05-22 ENCOUNTER — Inpatient Hospital Stay (HOSPITAL_COMMUNITY)
Admission: RE | Admit: 2010-05-22 | Discharge: 2010-05-25 | DRG: 246 | Disposition: A | Discharge status: Home / Self Care | Payer: Medicare Other | Source: Other Acute Inpatient Hospital | Attending: Cardiology | Admitting: Cardiology

## 2010-05-22 DIAGNOSIS — I5181 Takotsubo syndrome: Secondary | ICD-10-CM | POA: Diagnosis present

## 2010-05-22 DIAGNOSIS — I10 Essential (primary) hypertension: Secondary | ICD-10-CM | POA: Diagnosis present

## 2010-05-22 DIAGNOSIS — E785 Hyperlipidemia, unspecified: Secondary | ICD-10-CM | POA: Diagnosis present

## 2010-05-22 DIAGNOSIS — J449 Chronic obstructive pulmonary disease, unspecified: Secondary | ICD-10-CM | POA: Diagnosis present

## 2010-05-22 DIAGNOSIS — I2510 Atherosclerotic heart disease of native coronary artery without angina pectoris: Secondary | ICD-10-CM | POA: Diagnosis present

## 2010-05-22 DIAGNOSIS — I252 Old myocardial infarction: Secondary | ICD-10-CM

## 2010-05-22 DIAGNOSIS — E039 Hypothyroidism, unspecified: Secondary | ICD-10-CM | POA: Diagnosis present

## 2010-05-22 DIAGNOSIS — I214 Non-ST elevation (NSTEMI) myocardial infarction: Principal | ICD-10-CM | POA: Diagnosis present

## 2010-05-22 DIAGNOSIS — F17200 Nicotine dependence, unspecified, uncomplicated: Secondary | ICD-10-CM | POA: Diagnosis present

## 2010-05-22 DIAGNOSIS — J4489 Other specified chronic obstructive pulmonary disease: Secondary | ICD-10-CM | POA: Diagnosis present

## 2010-05-22 DIAGNOSIS — I509 Heart failure, unspecified: Secondary | ICD-10-CM | POA: Diagnosis present

## 2010-05-22 DIAGNOSIS — I6529 Occlusion and stenosis of unspecified carotid artery: Secondary | ICD-10-CM | POA: Diagnosis present

## 2010-05-22 DIAGNOSIS — I5031 Acute diastolic (congestive) heart failure: Secondary | ICD-10-CM | POA: Diagnosis present

## 2010-05-22 DIAGNOSIS — I658 Occlusion and stenosis of other precerebral arteries: Secondary | ICD-10-CM | POA: Diagnosis present

## 2010-05-22 DIAGNOSIS — I959 Hypotension, unspecified: Secondary | ICD-10-CM | POA: Diagnosis present

## 2010-05-22 LAB — COMPREHENSIVE METABOLIC PANEL
ALT: 62 U/L — ABNORMAL HIGH (ref 0–35)
AST: 40 U/L — ABNORMAL HIGH (ref 0–37)
Albumin: 3.2 g/dL — ABNORMAL LOW (ref 3.5–5.2)
Alkaline Phosphatase: 52 U/L (ref 39–117)
Glucose, Bld: 174 mg/dL — ABNORMAL HIGH (ref 70–99)
Potassium: 3.8 mEq/L (ref 3.5–5.1)
Sodium: 135 mEq/L (ref 135–145)
Total Protein: 6.3 g/dL (ref 6.0–8.3)

## 2010-05-22 LAB — CK TOTAL AND CKMB (NOT AT ARMC): Total CK: 202 U/L — ABNORMAL HIGH (ref 7–177)

## 2010-05-22 LAB — CBC
HCT: 39 % (ref 36.0–46.0)
Hemoglobin: 13.1 g/dL (ref 12.0–15.0)
MCHC: 33.6 g/dL (ref 30.0–36.0)

## 2010-05-22 LAB — MRSA PCR SCREENING: MRSA by PCR: NEGATIVE

## 2010-05-22 LAB — PROTIME-INR: INR: 1.59 — ABNORMAL HIGH (ref 0.00–1.49)

## 2010-05-23 DIAGNOSIS — I214 Non-ST elevation (NSTEMI) myocardial infarction: Secondary | ICD-10-CM

## 2010-05-23 LAB — CBC
HCT: 42.7 % (ref 36.0–46.0)
Hemoglobin: 14 g/dL (ref 12.0–15.0)
MCV: 96.4 fL (ref 78.0–100.0)
RBC: 4.43 MIL/uL (ref 3.87–5.11)
WBC: 11.9 10*3/uL — ABNORMAL HIGH (ref 4.0–10.5)

## 2010-05-23 LAB — CARDIAC PANEL(CRET KIN+CKTOT+MB+TROPI)
CK, MB: 3.7 ng/mL (ref 0.3–4.0)
Relative Index: 3.4 — ABNORMAL HIGH (ref 0.0–2.5)
Total CK: 149 U/L (ref 7–177)
Total CK: 108 U/L (ref 7–177)
Troponin I: 0.64 ng/mL (ref 0.00–0.06)
Troponin I: 0.66 ng/mL (ref 0.00–0.06)
Troponin I: 0.49 ng/mL — ABNORMAL HIGH (ref 0.00–0.06)

## 2010-05-23 LAB — BASIC METABOLIC PANEL
BUN: 11 mg/dL (ref 6–23)
Creatinine, Ser: 0.9 mg/dL (ref 0.4–1.2)
GFR calc non Af Amer: >60 mL/min (ref >60)
Glucose, Bld: 137 mg/dL — ABNORMAL HIGH (ref 70–99)

## 2010-05-23 LAB — HEMOGLOBIN A1C
Hgb A1c MFr Bld: 7.3 % — ABNORMAL HIGH (ref <5.7)
Mean Plasma Glucose: 163 mg/dL — ABNORMAL HIGH (ref <117)

## 2010-05-23 LAB — TSH: TSH: 0.233 u[IU]/mL — ABNORMAL LOW (ref 0.350–4.500)

## 2010-05-23 LAB — GLUCOSE, CAPILLARY: Glucose-Capillary: 95 mg/dL (ref 70–99)

## 2010-05-23 LAB — LIPID PANEL
HDL: 45 mg/dL (ref >39)
LDL Cholesterol: 41 mg/dL (ref 0–99)
Total CHOL/HDL Ratio: 2.3 RATIO
Triglycerides: 87 mg/dL (ref <150)
VLDL: 17 mg/dL (ref 0–40)

## 2010-05-24 ENCOUNTER — Inpatient Hospital Stay (HOSPITAL_COMMUNITY): Payer: Medicare Other

## 2010-05-24 DIAGNOSIS — I2510 Atherosclerotic heart disease of native coronary artery without angina pectoris: Secondary | ICD-10-CM

## 2010-05-24 DIAGNOSIS — I517 Cardiomegaly: Secondary | ICD-10-CM

## 2010-05-24 LAB — GLUCOSE, CAPILLARY
Glucose-Capillary: 112 mg/dL — ABNORMAL HIGH (ref 70–99)
Glucose-Capillary: 142 mg/dL — ABNORMAL HIGH (ref 70–99)

## 2010-05-24 LAB — CARDIAC PANEL(CRET KIN+CKTOT+MB+TROPI)
CK, MB: 3.4 ng/mL (ref 0.3–4.0)
Total CK: 101 U/L (ref 7–177)
Total CK: 95 U/L (ref 7–177)
Troponin I: 0.37 ng/mL — ABNORMAL HIGH (ref 0.00–0.06)
Troponin I: 0.36 ng/mL — ABNORMAL HIGH (ref 0.00–0.06)
Troponin I: 0.27 ng/mL — ABNORMAL HIGH (ref 0.00–0.06)

## 2010-05-24 LAB — POCT ACTIVATED CLOTTING TIME: Activated Clotting Time: 446 seconds

## 2010-05-25 LAB — CBC
Platelets: 256 10*3/uL (ref 150–400)
RBC: 4.56 MIL/uL (ref 3.87–5.11)

## 2010-05-25 LAB — PROTIME-INR
INR: 0.96 (ref 0.00–1.49)
Prothrombin Time: 13 seconds (ref 11.6–15.2)

## 2010-05-25 LAB — COMPREHENSIVE METABOLIC PANEL
CO2: 29 mEq/L (ref 19–32)
Calcium: 9.3 mg/dL (ref 8.4–10.5)
Creatinine, Ser: 0.99 mg/dL (ref 0.4–1.2)
GFR calc non Af Amer: 56 mL/min — ABNORMAL LOW (ref >60)
Glucose, Bld: 153 mg/dL — ABNORMAL HIGH (ref 70–99)
Total Bilirubin: 0.5 mg/dL (ref 0.3–1.2)

## 2010-05-25 LAB — TSH: TSH: 0.583 u[IU]/mL (ref 0.350–4.500)

## 2010-05-25 LAB — CARDIAC PANEL(CRET KIN+CKTOT+MB+TROPI)
Relative Index: INVALID (ref 0.0–2.5)
Troponin I: 0.24 ng/mL — ABNORMAL HIGH (ref 0.00–0.06)

## 2010-05-25 LAB — T4, FREE: Free T4: 1.33 ng/dL (ref 0.80–1.80)

## 2010-05-25 LAB — GLUCOSE, CAPILLARY: Glucose-Capillary: 138 mg/dL — ABNORMAL HIGH (ref 70–99)

## 2010-05-26 NOTE — Procedures (Signed)
NAMEMarland Kitchen  [name], [name] NO.:  [id number]  MEDICAL RECORD NO.:  [id number]           PATIENT TYPE:  I  LOCATION:  2901                         FACILITY:  [facility]  PHYSICIAN:  Nanetta Batty, M.D.   DATE OF BIRTH:  [birth year]  DATE OF PROCEDURE: DATE OF DISCHARGE:                           CARDIAC CATHETERIZATION   HISTORY OF PRESENT ILLNESS:  [name] [name] is a 70 year old female with history of CAD status post stenting of her RCA in 2005 by Dr. Veneda Melter.  She had mild LAD and mild-to-moderate proximal circumflex disease at that time.  The patient has continued to smoke.  Her other problems include COPD, hypertension, dyslipidemia, and GERD.  She developed chest pain last night lasting all night and today.  She presented to [facility] where she was found to have anterolateral T-wave inversion compared to her prior EKG.  She is transferred to [facility] and was evaluated by Dr. Armanda Magic.  At that time, she was hypotensive and was put on low-dose dopamine.  It was elected because of her hypotension, EKG changes and CHF, to bring her to the cath lab urgently to define her anatomy.  DESCRIPTION OF PROCEDURE:  The patient was brought to the [facility] Cardiac Cath Lab urgently in a postabsorptive state.  She was not premedicated.  Her right groin was prepped and shaved in usual sterile fashion.  Xylocaine 1% was used for local anesthesia.  A 6- French sheath was inserted into the right femoral artery using standard Seldinger technique.  A 6-French right and left Judkins diagnostic catheter as well as a 6-French pigtail catheter were used for selective coronary angiography and left ventriculography respectively.  Visipaque dye was used for the entirety of the case.  Retrograde aortic, left ventricular and pullback pressures were recorded.  HEMODYNAMICS: 1. Aortic systolic pressure 152, diastolic pressure 78. 2. Left ventricular systolic pressure 152,  end-diastolic pressure 29.  SELECTIVE CORONARY ANGIOGRAPHY: 1. Left main normal. 2. LAD; LAD had a 30% segmental proximal hypodense lesion at the first     septal perforator. 3. Left circumflex; left circumflex had a 75% segmental proximal hazy     lesion which represents progression of disease compared to her     prior cath. 4. Right coronary artery; dominant vessel with a 40-50% proximal     stenosis after the first stent, patent stent in the midportion and     40-50% stenosis at the crux. 5. Left ventriculography; RAO left ventriculogram was performed using     20 mL of IV dye at 10 mL per second done at the end of the case.     The LVEDP was 32.  EF was approximately 35% with apical dyskinesia.  IMPRESSION:  [name] [name] does not have an obvious "culprit lesion."  The __________ lesion is her proximal circumflex which appears hazy.  We will proceed with PCI and stenting using Angiomax and drug-eluting stent.  DESCRIPTION OF PROCEDURE:  The patient had received aspirin, received Plavix 600 mg p.o., Pepcid IV as well as Zofran and the Angiomax bolus with an ACT of 446.  Using a 6-French  XB 3.5 guide catheter along with an 0.14 x 190 [employer] soft wire a 2.0 x 12 track predilatation was performed.  Following this, a 2.75 x 18 Resolute Medtronic drug-eluting stent was then deployed at 14 atmospheres (3.06 mm resulting reduction of 75% proximal segmental circumflex stenosis to 0% residual).  The patient tolerated the procedure well.  The guidewire and catheter were removed and the sheath was sewn securely in place.  The patient did receive 40 mg of Lasix at the end of the case.  A Foley catheter was inserted.  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  While her circumflex represents progression of disease, I am not convinced if this was "a culprit vessel" though considering that I reached the apex and it was hazy did elect to intervene.  She  remained hemodynamically stable throughout the case.  She left the lab in stable condition.     Nanetta Batty, M.D.     Cordelia Pen  D:  05/22/2010  T:  05/23/2010  Job:  [id number]  cc:   [facility] [place] Cardiac Cath Lab Cassell Clement, M.D. [facility]  Electronically Signed by Nanetta Batty M.D. on 05/26/2010 05:10:42 PM

## 2010-05-31 ENCOUNTER — Other Ambulatory Visit: Payer: Self-pay | Admitting: *Deleted

## 2010-05-31 MED ORDER — ROSUVASTATIN CALCIUM 10 MG PO TABS: 10.0000 mg | ORAL_TABLET | Freq: Every day | ORAL | Status: DC

## 2010-06-02 NOTE — H&P (Signed)
NAME:  [name] [name], DORMAN NO.:  [id number]  MEDICAL RECORD NO.:  [id number]           PATIENT TYPE:  LOCATION:                                 FACILITY:  PHYSICIAN:  Armanda Magic, M.D.     DATE OF BIRTH:  [birth year]  DATE OF ADMISSION: DATE OF DISCHARGE:                             HISTORY & PHYSICAL   REFERRING PHYSICIAN:  [facility].  PRIMARY CARDIOLOGIST:  Cassell Clement, M.D.  CHIEF COMPLAINT:  Chest pain.  HISTORY OF PRESENT ILLNESS:  This is a 70 year old female with a history of CAD status post cath in 2005 after a rule in for myocardial infarction.  Cath had 30% LAD, 50% mid left circumflex, proximal 50%, and mid 90% RCA stenosis and she underwent PCI of the RCA with drug- eluting stent.  She has a history of nonobstructive carotid stenosis less than 50% bilaterally.  She was in her usual state of health until 10:00 p.m. yesterday evening when she developed substernal chest pain across her chest with no other radiation with shortness of breath and diaphoresis but no nausea or vomiting.  This is constant all night and this morning, she went to the emergency room.  She was found to have positive cardiac markers.  She currently is pain free.  PAST MEDICAL HISTORY:  CAD status post drug-eluting stent to the RCA in February 2005, normal LV function, COPD less than 50% bilateral carotid artery stenosis, dyslipidemia, hypertension, hypothyroidism, GERD.  ALLERGIES:  CODEINE, which causes nausea.  PAST SURGICAL HISTORY:  Node removal from neck, tonsillectomy, tubal ligation, and total abdominal hysterectomy with BSO, cholecystectomy.  SOCIAL HISTORY:  She smokes one half-pack of cigarettes daily.  She denies any alcohol use.  She is widowed with 2 children alive and well. She has two children who died in MVA.  FAMILY HISTORY:  Her father died of throat CA.  Her mother died of colon CA.  She has one brother who died of heart disease.  REVIEW  OF SYSTEMS:  Otherwise what was stated in the HPI is negative.  MEDICATIONS:  Hydrochlorothiazide 12.5 mg daily; lisinopril 20 mg daily; Bactrim DS one b.i.d. for 14 days, she just finished it; hydrocodone/APAP 5/500 mg one t.i.d.; diltiazem 240 mg daily; Imdur 30 mg daily; aspirin 325 mg daily; Crestor 10 mg daily; glipizide 5 mg daily; KCL 20 mEq daily; DuoNeb p.r.n.; Synthroid 100 mcg daily; omeprazole 40 mg daily; albuterol p.r.n.; calcium plus D 600 mg b.i.d.; Valium 5 mg t.i.d. p.r.n.; and Atrovent p.r.n.  PHYSICAL EXAMINATION:  VITAL SIGNS:  Blood pressure is 79/45, heart rate 69, O2 saturations 98% on room air. GENERAL:  This is a well-developed, well-nourished white female in no acute distress. HEENT:  Benign. NECK:  Supple without lymphadenopathy.  Carotid upstroke is +2 bilaterally with no bruits. LUNGS:  Scattered rhonchi. HEART:  Regular rate and rhythm.  No murmurs, rubs, or gallops.  Normal S1 and S2. ABDOMEN:  Soft, nontender, nondistended.  Normoactive bowel sounds.  No hepatosplenomegaly. EXTREMITIES:  No cyanosis, erythema, or edema.  LABORATORY DATA:  Sodium 135, potassium 3.3, chloride 103, bicarb 25, BUN 13,  creatinine 0.96 with glucose 253.  White cell count 16, hemoglobin 13.9, hematocrit 41.4, platelet count 224, CPK 208, MB 7.6, troponin 1.2, BNP 425.  EKG shows sinus rhythm with deeply inverted T- waves in the anterior precordial leads.  ASSESSMENT: 1. Non-ST-elevation myocardial infarction complicated by hypotension     and congestive heart failure. 2. Coronary artery disease status post percutaneous coronary     intervention of the right coronary artery in February 2005. 3. Acute congestive heart failure. 4. Hypokalemia. 5. Elevated white blood cell count secondary to non-ST-elevation     myocardial infarction. 6. Bilateral carotid artery stenosis, nonobstructive. 7. Chronic obstructive pulmonary disease. 8. Hypertension, now with low blood  pressure. 9. Dyslipidemia.  PLAN:  Given the patient's hemodynamic instability and CHF, we will proceed with emergent cardiac catheterization to evaluate coronary artery.  This is discussed with Dr. Allyson Sabal, who will take the patient to cath lab.  We will start renal dose dopamine, IV drip for blood pressure support.  We will continue IV heparin drip and continue home medications as blood pressure tolerates.     Armanda Magic, M.D.     TT/MEDQ  D:  05/22/2010  T:  05/23/2010  Job:  [id number]  cc:   Cassell Clement, M.D.  Electronically Signed by Armanda Magic M.D. on 06/02/2010 11:26:14 AM

## 2010-06-03 NOTE — Discharge Summary (Signed)
  [name], [name] [name]               ACCOUNT NO.:  [id number]  MEDICAL RECORD NO.:  [id number]           PATIENT TYPE:  I  LOCATION:  2010                         FACILITY:  [facility]  PHYSICIAN:  Cassell Clement, M.D. DATE OF BIRTH:  10/03/1940  DATE OF ADMISSION:  05/22/2010 DATE OF DISCHARGE:  05/25/2010                              DISCHARGE SUMMARY   ADDENDUM  Medication inadvertently left off, Ceftin 250 mg b.i.d. for 7 days.     Theodore Demark, PA-C   ______________________________ Cassell Clement, M.D.    RB/MEDQ  D:  05/25/2010  T:  05/26/2010  Job:  [id number]  Electronically Signed by Theodore Demark PA-C on 06/01/2010 08:25:07 AM Electronically Signed by Cassell Clement M.D. on 06/03/2010 12:27:39 PM

## 2010-06-03 NOTE — Discharge Summary (Signed)
[name] [name], [name] [name]               ACCOUNT NO.:  [id number]  MEDICAL RECORD NO.:  [id number]           PATIENT TYPE:  I  LOCATION:  2010                         FACILITY:  [facility]  PHYSICIAN:  Cassell Clement, M.D. DATE OF BIRTH:  [birth year]  DATE OF ADMISSION:  05/22/2010 DATE OF DISCHARGE:  05/25/2010                              DISCHARGE SUMMARY   PROCEDURES: 1. Cardiac catheterization. 2. Coronary arteriogram. 3. Left ventriculogram. 4. PTCA and 2.75 x 18 mm Resolute Medtronic drug-eluting stent to the     proximal circumflex. 5. A 2D echocardiogram. 6. Portable chest x-ray.  PRIMARY FINAL DISCHARGE DIAGNOSIS:  Non-ST segment elevation myocardial infarction, possibly Takotsubo phenomenon.  SECONDARY DIAGNOSES: 1. Ongoing tobacco use. 2. Family history of coronary artery disease in her brother. 3. Hypertension. 4. Hyperlipidemia. 5. Chronic obstructive pulmonary disease. 6. Hypothyroidism with a TSH of 0.233, repeat TSH and free T4 pending     at the time of dictation. 7. Diabetes with a hemoglobin A1c of 7.3 and a fasting blood sugar of     153. 8. Non-ST segment elevation myocardial infarction in 2005, with a     Cypher stent to the mid right coronary artery. 9. Allergy or intolerance to CODEINE. 10.Status post node removal from the neck, tonsillectomy,     hysterectomy, and cholecystectomy. 11.Mild acute diastolic congestive heart failure, resolved.  TIME OF DISCHARGE:  44 minutes.  HOSPITAL COURSE:  [name] [name] is a 70 year old female with a history of coronary artery disease.  She had chest pain which started the night before admission.  She went to the emergency room and was treated appropriately but she was felt to have some heart failure and hemodynamic instability, so she was taken directly to the cath lab.  The cardiac catheterization showed an LAD 30%, patent stents in the proximal midportion with distal 40-50% lesions.  The circumflex had a 75%  proximal hazy lesion that was treated with PTCA and stent, reducing the stenosis to 0.  Her EF was 35%.  The lesion treated with drug- eluting stent was appropriate for therapy but there was also concern for a Takotsubo cardiomyopathy.  A 2D echocardiogram was performed 48 hours later.  Her EF then was 55- 60% with no regional wall motion abnormalities.  Previously, her EF had been 35% with apical dyskinesia.  PAS was not listed.  She had been hypotensive on admission and required dopamine.  By May 24, 2010, this was weaned off.  She was started on an ACE inhibitor and beta-blocker. She was seen by Cardiac Rehab and Smoking Cessation.  She has a history of paroxysmal atrial fibrillation and has declined Coumadin in the past. She was monitored during her hospital stay but maintained sinus rhythm. She had some problems with hypoxia and was on oxygen.  She had required IV Lasix for diuresis upon admission and even with her respiratory status at baseline, she still has some problems with hypoxia.  She will be checked today and home O2 will be used if she qualifies.  She was seen by Cardiac Rehab and Smoking Cessation.  On May 25, 2010, [name] [name] was seen  by Dr. Patty Sermons.  Her respiratory status was felt to be at baseline.  An IV site that had infiltrated and possible developing cellulitis.  She will be discharged on antibiotics and can follow up as an outpatient.  On May 25, 2010, [name] [name] was considered stable for discharge in improved condition.  DISCHARGE INSTRUCTIONS:  Her activity level is to be increased gradually.  She is encouraged to stick to a low-sodium heart-healthy diabetic diet.  She is to call our office for problems with the cath site.  She is not to use tobacco.  She is to follow up with Dr. Andee Lineman and our office will call her.  She is to follow up with Dr. Sherril Croon as needed.  DISCHARGE MEDICATIONS: 1. Atrovent nebs t.i.d. p.r.n. 2. Valium 5 mg t.i.d. p.r.n. 3.  Lisinopril 10 mg a day. 4. Toprol-XL 25 mg a day. 5. Cardizem CD is discontinued. 6. Crestor 10 mg a day. 7. Multivitamin daily. 8. Imdur 30 mg a day is discontinued. 9. Sublingual nitroglycerin p.r.n. 10.Vicodin 5 mg t.i.d. as prior to admission. 11.Aspirin 81 mg is discontinued. 12.Aspirin 325 mg a day. 13.Plavix 75 mg a day. 14.Omeprazole 40 mg a day. 15.Calcium carbonate plus D b.i.d. 16.Potassium 20 mEq a day. 17.Albuterol nebulizers q.6 hours p.r.n. 18.DuoNeb q.6 hours p.r.n. 19.Glipizide 5 mg a day. 20.HCTZ 12.5 mg daily as prior to admission. 21.Synthroid 100 mcg per day.     Theodore Demark, PA-C   ______________________________ Cassell Clement, M.D.    RB/MEDQ  D:  05/25/2010  T:  05/25/2010  Job:  [id number]  cc:   Doreen Beam, MD  Electronically Signed by Theodore Demark PA-C on 06/01/2010 08:24:47 AM Electronically Signed by Cassell Clement M.D. on 06/03/2010 12:27:36 PM

## 2010-06-17 ENCOUNTER — Ambulatory Visit (INDEPENDENT_AMBULATORY_CARE_PROVIDER_SITE_OTHER): Payer: Medicare Other | Admitting: Cardiology

## 2010-06-17 ENCOUNTER — Encounter: Payer: Self-pay | Admitting: Cardiology

## 2010-06-17 VITALS — BP 144/80 | HR 63 | Ht 62.0 in | Wt 156.0 lb

## 2010-06-17 DIAGNOSIS — I4891 Unspecified atrial fibrillation: Secondary | ICD-10-CM

## 2010-06-17 DIAGNOSIS — I519 Heart disease, unspecified: Secondary | ICD-10-CM

## 2010-06-17 DIAGNOSIS — I2510 Atherosclerotic heart disease of native coronary artery without angina pectoris: Secondary | ICD-10-CM

## 2010-06-17 DIAGNOSIS — I6529 Occlusion and stenosis of unspecified carotid artery: Secondary | ICD-10-CM

## 2010-06-17 NOTE — Assessment & Plan Note (Signed)
The patient had prior stenting several years ago with drug-eluting stents. She will need to remain lifelong dual antiplatelet therapy.

## 2010-06-17 NOTE — Assessment & Plan Note (Signed)
Followup carotid Dopplers will be obtained in 3 months at the same time a cardiac echocardiogram will be performed.

## 2010-06-17 NOTE — Assessment & Plan Note (Signed)
Followup echocardiogram will be ordered in 3 months.

## 2010-06-17 NOTE — Patient Instructions (Signed)
   Echo & carotid dopplers in 3 months before next office visit Your physician wants you to follow up in:  3 months.  You will receive a reminder letter in the mail one-two months in advance.  If you don't receive a letter, please call our office to schedule the follow up appointment

## 2010-06-17 NOTE — Assessment & Plan Note (Signed)
Patient declined Coumadin in the past and currently now she is on dual antiplatelet therapy.

## 2010-06-17 NOTE — Progress Notes (Signed)
HPI [name] [name] underwent a recent cardiac catheterization several weeks ago. She underwent PTCA and a Medtronic drug-eluting stent to the proximal circumflex. This was in the setting of a non-ST elevation myocardial infarction although there was some mention made of a Tacko-tsubo phenomenon. Ejection fraction at the time of catheterization was 35%. The [name] [name] has an ongoing history of tobacco use. She struck her most of coronary artery disease hypertension hyperlipidemia as well as a COPD. She also has history of hypothyroidism. She also had a non-ST elevation myocardial infarction in 2005 with a Cypher stent to the midright coronary artery. Of note is that [name] [name] also is a prior history of paroxysmal atrial fibrillation currently remaining in normal sinus rhythm. In the past she has declined Coumadin. She also declined a stress test during her last office visit in November of 2011. The [name] [name] has been doing well since her MI. She reports no chest pain. She has had shortness of breath. She has significant COPD and continues to smoke 3 cigarettes a day. She denies any palpitations. She has no orthopnea or PND. She's compliant with aspirin Plavix after recent acute coronary syndrome.  Allergies  Allergen Reactions  . Codeine     REACTION: stomach upset  . Ciprofloxacin Nausea And Vomiting    Current Outpatient Prescriptions on File Prior to Visit  Medication Sig Dispense Refill  . rosuvastatin (CRESTOR) 10 MG tablet Take 1 tablet (10 mg total) by mouth daily.  30 tablet  3    Past Medical History  Diagnosis Date  . Hypertension   . Hyperlipidemia   . Coronary artery disease   . Atrial fibrillation   . Atrial flutter   . Obstructive chronic bronchitis without exacerbation   . Type II or unspecified type diabetes mellitus without mention of complication, not stated as uncontrolled   . Postsurgical percutaneous transluminal coronary angioplasty status   . Old myocardial infarction   . Other  iatrogenic hypotension   . Tobacco use disorder   . Pure hypercholesterolemia     Past Surgical History  Procedure Date  . Appendectomy   . Cholecystectomy   . Coronary angioplasty with stent placement   . Abdominal hysterectomy   . Excision of melanoma 04/29/2010    lower left neck    No family history on file.  History   Social History  . Marital Status: Widowed    Spouse Name: N/A    Number of Children: N/A  . Years of Education: N/A   Occupational History  . Not on file.   Social History Main Topics  . Smoking status: Current Everyday Smoker -- 0.3 packs/day    Types: Cigarettes  . Smokeless tobacco: Not on file  . Alcohol Use: No  . Drug Use: No  . Sexually Active: Not on file   Other Topics Concern  . Not on file   Social History Narrative  . No narrative on file   Review of systems:Pertinent positives as outlined above. The remainder of the 18  point review of systems is negative  PHYSICAL EXAM BP 144/80  Pulse 63  Ht 5\' 2"  (1.575 m)  Wt 156 lb (70.761 kg)  BMI 28.53 kg/m2  SpO2 94%  General: Well-developed, well-nourished in no distress Head: Normocephalic and atraumatic Eyes:PERRLA/EOMI intact, conjunctiva and lids normal Ears: No deformity or lesions Mouth:normal dentition, normal posterior pharynx Neck: Supple, no JVD.  No masses, thyromegaly or abnormal cervical nodes. Left carotid bruit Lungs: Normal breath sounds bilaterally without wheezing.  Normal percussion  Cardiac: regular rate and rhythm with normal S1 and S2, no S3 or S4.  PMI is normal.  No pathological murmurs Abdomen: Normal bowel sounds, abdomen is soft and nontender without masses, organomegaly or hernias noted.  No hepatosplenomegaly MSK: Back normal, normal gait muscle strength and tone normal Vascular: Pulse is normal in all 4 extremities Extremities: No peripheral pitting edema Neurologic: Alert and oriented x 3 Skin: Intact without lesions or rashes Lymphatics: No  significant adenopathyPsychologic: Normal affect    ZOX:WRUEAV sinus rhythm  ASSESSMENT AND PLAN

## 2010-07-13 NOTE — Assessment & Plan Note (Signed)
[facility]                          [facility]   NAME:[name] [name], [name] [name]                      MRN:          [id number]  DATE:05/01/2007                            DOB:          April 25, 1940    HISTORY OF PRESENT ILLNESS:  The patient is a 70 year old female with a  history of coronary artery disease and exertional dyspnea.  The patient  has known COPD.  The patient is actually been doing well and she reports  no chest pain.  She had an echocardiograph study done, which showed  normal LV function.  She declined to do a stress test.  Her chest x-ray  shows chronic bronchitic changes.   MEDICATIONS:  1. Potassium.  2. Hydrochlorothiazide.  3. Isosorbide.  4. Diltiazem.  5. Nexium.  6. Levothyroxine.  7. Crestor.  8. Lisinopril.  9. Aspirin.  10.Albuterol inhaler.   PHYSICAL EXAMINATION:  VITAL SIGNS:  Blood pressure 149/71, heart rate  61.  Weight 154 pounds.  NECK:  Normal carotid upstroke.  No carotid bruits.  LUNGS:  Clear breath sounds bilaterally.  Scattered rhonchi with faint  wheezes.  HEART: _________  ABDOMEN:  Soft, nontender.  EXTREMITIES:  No cyanosis, clubbing or edema.   PROBLEM LIST:  1. Coronary artery disease.  Declined stress test (stable symptoms).  2. Exertional dyspnea.  Likely secondary to her COPD.  3. Ongoing tobacco use.  4. Failed Chantix therapy.  5. Hypertension.  6. Hypothyroidism.  7. Type 2 diabetes mellitus.   PLAN:  1. Reviewed patient's chest x-ray.  This demonstrates chronic      underlying lung disease, which is likely explaining her dyspnea.  2. No definite evidence of worsening ischemic heart disease, although      the patient declined a stress test.  3. The patient can follow up with [place] in six months.  4. The patient requested a refill on her Valium.  I told her I would      do it for a brief period of time, but that she needs to further      discuss this with her primary care  physician.     Learta Codding, MD,FACC  Electronically Signed    GED/MedQ  DD: 05/01/2007  DT: 05/01/2007  Job #: [id number]

## 2010-07-13 NOTE — Assessment & Plan Note (Signed)
[facility]                          [facility]   NAME:Seivert, MEELA WAREING                      MRN:          [id number]  DATE:09/09/2008                            DOB:          03/21/1940    HISTORY OF PRESENT ILLNESS:  Mrs. Klinkner is a pleasant female with past  medical history of coronary artery disease who returns for followup.  She did undergo cardiac catheterization in February 2005 after ruling in  for myocardial infarction.  At that time she had a 30% LAD.  There was a  50% mid circumflex.  The right coronary artery was dominant.  They had a  50% lesion at a proximal band and then it was a 90% in the mid right  coronary artery.  She had a stent placed to the mid right coronary  artery at that time (Cypher drug-eluting stent).  Her last  echocardiogram was performed in February 2009.  At that time, she had  normal LV function.  There were no significant valvular abnormalities  noted.  She also has cerebrovascular disease.  She had carotid Dopplers  in December 2009, that showed less than 50% narrowing on the right and  less than 50% on the left.  She has been treated medically.  She was  last seen in this office in December 2009.  Since then she does have  dyspnea on exertion.  However, this has been a chronic issue and  attributed to her COPD.  There is no orthopnea, PND, pedal edema,  palpitations, syncope, or chest pain.  She does continue to smoke.   MEDICATIONS:  1. Potassium 2 mEq p.o. daily.  2. HCTZ 12.5 mg p.o. daily.  3. Imdur 30 mg p.o. daily.  4. Cardizem 240 mg p.o. daily.  5. Nexium 40 mg p.o. daily.  6. Levothyroxine 100 mcg p.o. daily.  7. Crestor 10 mg p.o. daily.  8. Lisinopril 40 mg p.o. daily.  9. Aspirin 81 mg p.o. daily.  10.Albuterol inhaler.  11.Glipizide 5 mg p.o. daily.   PHYSICAL EXAMINATION:  VITAL SIGNS:  Blood pressure of 104/62 and the  pulse of 71.  She weighs 148.8 pounds.  GENERAL:  She is  well-developed and well-nourished in no acute distress.  SKIN:  Warm and dry.  HEENT:  Normal.  NECK:  Supple and there are no bruits noted.  There is no thyromegaly  noted.  CHEST:  Diminished breath sounds throughout and there is rhonchi noted.  CARDIOVASCULAR:  Regular rate and rhythm.  ABDOMEN:  No tenderness.  I cannot palpate pulsatile masses and there is  no bruit.  EXTREMITIES:  No edema.  She has 2+ posterior tibial pulses.   DIAGNOSIS:  1. Coronary artery disease, status post drug-eluting stent to the      right coronary artery in 2005 - the patient appears to be unchanged      symptomatically.  She does have dyspnea on exertion, but this is      most likely related to her chronic obstructive pulmonary disease.      However, she has had no assessment of  her coronary disease in 5      years.  We discussed a possible stress test, but she declined.  She      states that her husband had a myocardial infarction at the time he      had a previous study.  She is unwilling to proceed with this.  We      will continue with her aspirin, statin, Cardizem, and nitrates.  2. Tobacco abuse - we discussed the importance of discontinuing this      for between 3-10 minutes.  3. Hypertension - her blood pressure is controlled on her present      medications.  Dr. Janeice Robinson is following her renal function and      potassium.  4. Hyperlipidemia - she will continue on her statin.  Dr. Janeice Robinson is      following her lipids and liver.  5. Cerebrovascular disease - she will continue with her aspirin and      statin.  She will need followup carotid Dopplers in December of      this year.  6. Hypothyroidism.  7. Chronic obstructive pulmonary disease.   She will follow up in his office in 6 months.     Madolyn Frieze Jens Som, MD, [facility]  Electronically Signed    BSC/MedQ  DD: 09/09/2008  DT: 09/10/2008  Job #: [id number]   cc:   Doreen Beam, MD

## 2010-07-13 NOTE — Assessment & Plan Note (Signed)
[facility]                          [facility] NOTE   NAME:Morones, CURLIE SITTNER                      MRN:          [id number]  DATE:08/17/2006                            DOB:          03-Dec-1940    CARDIOLOGIST:  Learta Codding, MD,FACC.   PRIMARY CARE PHYSICIAN:  Dr. Eliberto Ivory.   HISTORY OF PRESENT ILLNESS:  Ms. Tozer is a 70 year old female patient  with a history of coronary artery disease, status post non-ST elevation  myocardial infarction in February 2005, treated with a CYPHER drug-  eluting stent to the mid RCA.  She returns today for routine followup.  She is doing well without any complaints of chest discomfort or  significant shortness of breath.  She does, however, note chronic  dyspnea on exertion related to her COPD.  This has not really changed  since we last saw her.  Actually, it seems to have gotten somewhat  better.  She denies any chest heaviness or tightness with exertion  suggestive of angina.  She does, however, note an occasional dyspepsia.  She takes Nexium on occasion with relief.  She denies syncope or near  syncope, denies orthopnea, paroxysmal nocturnal dyspnea, or pedal edema.  Denies any palpitations.   CURRENT MEDICATIONS:  1. HCTZ 25 mg, half tablet a day.  2. Potassium 20 mEq daily.  3. Lisinopril 40 mg, half tablet daily.  4. Isosorbide ER 30 mg, half tablet daily.  5. Nexium 40 mg daily.  6. Diltiazem CD 240 mg daily.  7. Levothyroxine 100 mcg daily.  8. Lipitor 20 mg daily.  9. Gemfibrozil 600 mg b.i.d.  10.Aspirin 81 mg daily.  11.Albuterol p.r.n.  12.Vicodin p.r.n.  13.Nitroglycerin p.r.n.   ALLERGIES:  1. CODEINE.  2. PLAVIX also causes a weak stomachache.   SOCIAL HISTORY:  She continues to smoke cigarettes.   PHYSICAL EXAMINATION:  GENERAL:  She is a well nourished, well developed  female in no distress.  VITAL SIGNS:  Blood pressure is 117/68, pulse 60, weight 151 pounds.  HEENT:   Normal.  NECK:  Without JVD.  CARDIAC:  S1 S2.  Regular rate and rhythm without murmurs.  LUNGS:  Clear to auscultation bilaterally without wheezing, rhonchi, or  rales.  ABDOMEN:  Soft nontender with normoactive bowel sounds.  No  organomegaly.  EXTREMITIES:  Without edema.  Calves soft nontender.  SKIN:  Warm and dry.  NEUROLOGIC:  She is alert and oriented x3.  Cranial nerves II-XII  grossly intact.  Carotids without bruits bilaterally.   Electrocardiogram reveals a sinus rhythm with a heart rate of 58, normal  axis, no acute changes, no significant changes from previous tracing.   IMPRESSION:  1. Coronary artery disease.      a.     Status post non-ST elevation myocardial infarction, treated       with CYPHER stenting to the mid right coronary artery - February       2005.      b.     Residual nonobstructive coronary artery disease by       catheterization in 2005:  Left anterior descending artery 30%, AV       circumflex 50%, marginal branch 30%, right coronary artery       proximal 50%, distal right coronary artery 30%.      c.     Preserved left ventricular function with an ejection       fraction of 55%.  2. Gastroesophageal reflux disease.  3. Chronic obstructive pulmonary disease.  4. Dyslipidemia.      a.     Recent lipid panel triglycerides 124, total cholesterol 165,       HDL 46, LDL 94.  5. Hypertension.  6. Hypothyroidism.  7. Diabetes mellitus.   PLAN:  The patient presents to the office today for followup.  She is  doing well without any chest discomfort suggestive of angina.  She does  note some dyspepsia and is not really taking her Nexium regularly.  I  have recommended she go ahead and take Nexium on a daily basis.  Hopefully this will help alleviate her symptoms.  If she has  breakthrough symptoms while on Nexium, she may need referral to  gastroenterology.  She did have a lipid panel done recently and her LDL  is 94.  With her coronary disease and now  a history of diabetes, her LDL  should be less than 70.  I have recommended we increase her Lipitor to  40 mg q.h.s.  We will get followup lipids and LFTs in about 6-8 weeks.  I will have her follow up with Dr. Andee Lineman in 6 months' time.     Tereso Newcomer, PA-C  Electronically Signed      Learta Codding, MD,FACC  Electronically Signed   SW/MedQ  DD: 08/17/2006  DT: 08/17/2006  Job #: [id number]

## 2010-07-13 NOTE — Assessment & Plan Note (Signed)
[facility]                          [facility]   NAME:Mellette, KATHERYN CULLITON                      MRN:          [id number]  DATE:05/01/2007                            DOB:          04/20/1940    PRESENT ILLNESS:  Ms. Ailey is a 70 year old female with a history of  chronic bronchitis and coronary artery disease.  The patient was last  seen in the office on March 27, 2007   Dictation ended here.     Learta Codding, MD,FACC     GED/MedQ  DD: 05/01/2007  DT: 05/01/2007  Job #: [id number]

## 2010-07-13 NOTE — Assessment & Plan Note (Signed)
[facility]                          [facility]   NAME:[name] [name], [name] [name]                      MRN:          [id number]  DATE:03/27/2007                            DOB:          03-25-1940    PRIMARY CARDIOLOGIST:  Learta Codding, MD.   REASON FOR VISIT:  A 6 month followup.   HISTORY:  [name] [name] reports no significant change from her baseline,  since last seen here in the clinic in June 2008.  However, she continues  to experience significant exertional dyspnea, but denies any shortness  of breath at rest.  She also suggests some associated chest tightness  with exertion, relieved by rest, which appears to be a chronic and  stable pattern.  She has not used any nitroglycerin since undergoing  percutaneous intervention in 2005, following presentation with a non-ST  elevation myocardial infarction.   Unfortunately, [name] [name] continues to smoke.  Chantix has been tried in  the past, but to little avail.  She is tolerating Crestor, which I  started her on in place of Lipitor.  This was based on several recent  serial lipid profiles which suggested a downward trend in the HDL on the  increased dose of Lipitor.  Her most recent profile in October showed  improved LDL profile with a reading of 68, down from 82.  HDL remained  stable at 37 and total cholesterol was down to 137, from 150 previously.   CURRENT MEDICATIONS:  1. Crestor 10 daily.  2. Aspirin 81 daily.  3. Lisinopril 40 daily.  4. Gemfibrozil 600 b.i.d.  5. Levothyroxine 0.1 mg daily.  6. Nexium.  7. Diltiazem ER 240 daily.  8. Isosorbide ER 30 daily.  9. HCTZ 12.5 daily.  10.Potassium 20 daily.  11.Albuterol MDI.   REVIEW OF SYSTEMS:  Denies claudication, otherwise as per HPI.  Remaining systems negative.   PHYSICAL EXAMINATION:  VITAL SIGNS:  Blood pressure 118/75, pulse 67  regular, weight 152.4, sats 96% room air.  GENERAL:  A 69 year old female sitting upright in  no distress.  HEENT:  Normocephalic, atraumatic.  NECK:  Palpable carotid pulses without bruits.  No JVD.  LUNGS:  Diminished breath sounds with faint, late expiratory wheezes.  HEART:  Regular rate and rhythm (S1 and S2).  No significant murmurs.  No rubs.  ABDOMEN:  Soft, nontender with active bowel sounds.  EXTREMITIES:  Palpable distal pulses with no edema.  NEURO:  No focal deficits.   IMPRESSION:  1. Progressive exertional dyspnea.      a.     Associated chest tightness, chronic  2. Coronary artery disease.      a.     Non-ST elevation myocardial infarction/Cypher stenting mid       right coronary artery, February 2005.      b.     Nonobstructive residual coronary artery disease.      c.     Normal left ventricular ejection fraction.  3. Chronic obstructive pulmonary disease/ongoing tobacco.      a.     Failed Chantix.  4. Dyslipidemia.  a.     Improved profile, following substitution with Crestor.  5. Hypertension, well-controlled.  6. Hypothyroidism.  7. Type 2 diabetes mellitus, diet-controlled.   PLAN:  1. Adenosine stress Cardiolite for risk stratification.  The patient      is now nearly 3 years out since undergoing PCI of the RCA,      following presentation with non-STEMI.  2. A 2-D echocardiogram for further evaluation of progressive      exertional dyspnea.  3. Schedule a two-view chest x-ray, given the patient has not had one      in the recent past and has a longstanding history of tobacco      smoking.  4. Contact 1-800-QuitNow for smoking cessation assistance.  5. Continue current medication regimen.  I did consider substituting      Diltiazem with a beta blocker, given the patient's history of non-      STEMI.  However, with ongoing active wheezing, I have elected to      keep her on the calcium channel blocker.  I suspect the patient      may have a component of reactive airway disease and may not be able      to tolerate a beta blocker.  6.  Schedule return clinic follow up with myself and Dr. Andee Lineman in 1      month for review of study results and further recommendations.  If      the stress test is negative for ischemia, and the      echocardiogram shows normal LVEF, then I suspect the patient's      symptoms are predominantly related to chronic lung disease.      Gene Serpe, PA-C  Electronically Signed      Learta Codding, MD,FACC  Electronically Signed   GS/MedQ  DD: 03/27/2007  DT: 03/27/2007  Job #: [id number]   cc:   Doreen Beam, MD

## 2010-07-13 NOTE — Assessment & Plan Note (Signed)
[facility]                          [facility]   NAME:[name] [name], [name] [name]                      MRN:          [id number]  DATE:02/01/2008                            DOB:          August 27, 1940    REFERRING PHYSICIAN:  Doreen Beam, MD   HISTORY OF PRESENT ILLNESS:  The patient is a 70 year old female with a  history of coronary artery disease and exertional dyspnea.  She has  known severe COPD.  She reports no substernal chest pain.  She reports  bronchitic complaints with white phlegm.  She has no fever or chills.  No orthopnea or PND.  She still refuses to undergo a stress testing.  She continues to smoke half a pack a day.   MEDICATIONS:  1. Potassium 20 mEq p.o. daily.  2. Hydrochlorothiazide 25 mg half a tablet p.o. daily.  3. Isosorbide ER 30 mg p.o. daily.  4. Diltiazem ER 240 mg p.o. daily.  5. Nexium 40 mg a day.  6. Levothyroxine 100 mcg p.o. daily.  7. Crestor 10 mg p.o. nightly.  8. Lisinopril 40 mg p.o. daily.  9. Aspirin 81 mg p.o. daily.  10.Albuterol inhaler.  11.Glipizide 5 mg p.o. daily.   PHYSICAL EXAMINATION:  VITAL SIGNS:  Blood pressure 112/62, heart rate  71, and weight is 150 pounds.  NECK:  Normal carotid upstroke with left-sided carotid bruit.  Right  side, no carotid bruit.  Normal carotid upstroke, no thyromegaly.  no  nodular thyroid.  LUNGS:  Scattered rhonchi with rales at the bases.  HEART:  Regular rate and rhythm.  Normal S1 and S2.  No murmurs, rubs,  or gallops.  ABDOMEN:  Soft and nontender.  No rebound or guarding.  Good bowel  sounds.  EXTREMITIES:  No cyanosis, clubbing, or edema.  Peripheral pulses are  intact.  Dorsalis pedis and posterior tibial pulses.  NEUROLOGIC:  The patient is alert, oriented, grossly nonfocal.   PROBLEMS:  1. Coronary artery disease.  The patient continues to decline stress      testing, although her last catheterization was in 2005.  2. Exertional dyspnea, which  is a combination of chronic obstructive      pulmonary disease and possibly angina equivalent.  3. Ongoing tobacco use.  4. Failed Chantix therapy.  5. Hypertension, controlled.  6. Hypothyroidism.  7. Type 2 diabetes mellitus.  8. Carotid bruit left sided.   PLAN:  1. The patient has a new left-sided carotid bruit.  We will send her      for carotid Dopplers.  2. She has no chest pain.  She does have exertional dyspnea, but it is      chronic and may well be most likely related to her COPD.  She also      appears to have an acute bronchitic exacerbation, although she is      not febrile and does not report any yellow phlegm.  3. The EKG was reviewed and remains within normal limits.  4. The patient can follow up with [place] in 6 months.     Learta Codding, MD,FACC  Electronically Signed    GED/MedQ  DD: 02/01/2008  DT: 02/02/2008  Job #: [id number]   cc:   Doreen Beam, MD

## 2010-07-16 NOTE — Assessment & Plan Note (Signed)
[facility]                            [facility] OFFICE NOTE   NAME:[name] [name], [name] [name]                      MRN:          [id number]  DATE:01/13/2006                            DOB:          January 31, 1941    PRIMARY CARDIOLOGIST:  [name] Codding, MD,FACC.   REASON FOR OFFICE VISIT:  Scheduled six-month followup.  The patient denies  any symptoms suggestive of intermittent claudication.   HISTORY OF PRESENT ILLNESS:  Mrs. [name] [name] is a 70 year old female, with known  coronary artery disease, who presents today for scheduled followup. She  reports no further chest discomfort since being placed on low dose Imdur at  the time of her last office visit.  Of note, she did have to cut the Imdur  to half dose given concern that it was exacerbating her abdominal burning.  However, this has not seemed to have ameliorated her discomfort.  From a  clinical standpoint, she reports no exertion-related chest discomfort. She  has chronic dyspnea and continues to smoke, but has significantly reduced  her level since being placed on Chantix by Dr. Eliberto [name].   Albeit slowly, the patient reports that she can climb a flight of stairs  with some associated dyspnea but no associated chest discomfort.   CURRENT MEDICATIONS:  1. Lipitor 20 mg daily.  2. Gemfibrozil 600 mg b.i.d.  3. Mavik 5 mg daily.  4. KCl 40 mEq daily.  5. Hydrochlorothiazide 12.5 daily.  6. Cardizem CD 240 daily.  7. Albuterol MDI p.r.n.  8. Synthroid 0.100 mg daily.  9. Nexium 40 mg daily.  10.Aspirin 81 mg daily.  11.Imdur 15 mg daily.   PHYSICAL EXAMINATION:  VITAL SIGNS:  Blood pressure 120/58, pulse 56 and  regular, weight 156.  GENERAL:  A 70 year old female, sitting upright, in no apparent distress.  NECK:  Palpable carotid pulses without bruits.  No JVD.  LUNGS:  Diminished breath sounds at bases with right base rhonchi. No  wheezes.  HEART:  Regular rate and rhythm. (S1 and S2).  No  significant murmurs.  ABDOMEN:  Soft, nontender with intact bowel sounds.  EXTREMITIES:  Palpable peripheral pulses (left greater than right).  No  significant edema.   IMPRESSION:  1. Single-vessel coronary artery disease.      a.     Status post non ST elevation myocardial infarction/Cypher       stenting mid right coronary artery, February 2005.      b.     Normal left ventricular function.  2. Chronic obstructive pulmonary disease/ongoing tobacco smoking.  Marked      improvement since being placed on Chantix.  3. Hyperlipidemia, followed by Dr. Eliberto [name].  4. Hypertension.  5. Hypothyroidism.  6. Gastroesophageal reflux disease.   PLAN:  1. Increase Nexium  to b.i.d. dosing to see if this helps suppress her      burning.  If she continues to have symptoms, however, I advised her to      follow up with Dr. Eliberto [name].  2. Aggressive secondary prevention, particularly with respect to lipid      management with  LDL goal of 70 or less.  We will request a copy of her      most recent lipid profile.  3. Return clinic followup with Dr. Andee [name] in six months.      [name] Serpe, PA-C  Electronically Signed      [name] Codding, MD,FACC  Electronically Signed   GS/MedQ  DD: 01/13/2006  DT: 01/13/2006  Job #: [id number]   cc:   [name] [name]

## 2010-07-16 NOTE — Cardiovascular Report (Signed)
NAMECHARMEKA, FREEBURG                           ACCOUNT NO.:  [id number]   MEDICAL RECORD NO.:  [id number]                   PATIENT TYPE:  INP   LOCATION:  6525                                 FACILITY:  [facility]   PHYSICIAN:  Veneda Melter, M.D.                   DATE OF BIRTH:  1940-04-22   DATE OF PROCEDURE:  04/10/2003  DATE OF DISCHARGE:                              CARDIAC CATHETERIZATION   PROCEDURES PERFORMED:  1. Left heart catheterization.  2. Left ventriculogram.  3. Selective coronary angiography.  4. Percutaneous transluminal coronary angioplasty and stent placement in mid     right coronary artery.   DIAGNOSES:  1. Severe single-vessel coronary artery disease.  2. Normal left ventricular systolic function.  3. Non-ST elevation myocardial infarction.  4. 1+ mitral regurgitation.   HISTORY:  Ms. Capshaw is a 70 year old female with tobacco use who presents  with severe substernal chest discomfort.  The patient was admitted to  [facility] and subsequently ruled in for a non-ST elevation  myocardial infarction.  She was stabilized medically and is transferred for  further assessment.   TECHNIQUE:  Informed consent was obtained.  The patient brought to the  catheterization lab.  A 6 French sheath was placed in the right femoral  artery using the modified Seldinger technique.  A 6 [place] JL-4 and JR-4  catheter was then used to engage the left and right coronary arteries and  selective angiography performed in various projections using manual  injection contrast.  A 6 French pigtail catheter was advanced in the left  ventricle and a left ventriculogram performed using power injection  contrast.   FINDINGS:   LEFT HEART CATHETERIZATION:  1. Left main trunk:  Medium caliber vessel with mild irregularities.  2. LAD:  This is a medium caliber vessel that provides two small diagonal     branches in the mid section.  The LAD has mild irregularities of 30% at     the  take off of the first diagonal branch.  The remainder of the vessel     has luminal disease.  3. Left circumflex artery:  This is a medium caliber vessel that consists of     large marginal branch mid section.  The AV circumflex has moderate     narrowing of 50% in the mid section.  The marginal branch has mild     diffuse disease of 30%.  4. Right coronary artery is dominant.  This is a small caliber vessel that     provides a small posterior descending artery and posterior ventricular     branch in the terminal segment.  The right coronary artery has moderate     narrowing of 50% at the proximal bend.  There is then a further high     grade narrowing of 90% in the mid section after the take off in the RV  marginal branch.  The distal RCA has diffuse disease of 30%.   LEFT VENTRICULOGRAPHY:  1. Normal end-systolic and end-diastolic dimensions.  2. Overall left ventricular function is well preserved.  3. Ejection fraction greater than 55%.  4. 1+ mitral regurgitation is noted that may be exacerbated by ectopy.  5. LV pressure is 160/10.  6. Aortic pressure is 160/70.  7. LVEDP equals 20.   With these findings, we elected to proceed with percutaneous intervention of  the mid right coronary artery.  The patient was pretreated with aspirin and  Plavix.  She was given 5000 units of heparin to maintain ACT of greater than  300 seconds.  A 6 [place] JR-4 guide catheter was used to engage the right  coronary artery and 0.014 inch Forte wire advanced in the mid RCA and used  to size lesion length and vessel diameter.  The wire was positioned distally  and a 2.5 x 18-mm Cypher drug-eluting stent was introduced and carefully  positioned in the mid RCA and deployed at 12 atmospheres for 60 seconds.  A  2.75 x 15-mm Quantum Maverick balloon was then used to post dilate the  stent.  Two inflations were performed at 16 atmospheres for 30 seconds each.  Repeat angiography showed an excellent  result with no residual stenosis,  full coverage of the lesion, TIMI-3 flow through the RCA.  The guide  catheter was then removed and the sheath secured in position.  The patient  tolerated the procedure well and was transferred to the floor in stable  condition.   FINAL RESULTS:  Successful percutaneous transluminal coronary angioplasty  and stent placement to the mid right coronary artery with reduction of 90%  narrowing to 0% using a 2.5 x 18 Cypher drug-eluting stent dilated to 2.75  mm.   ASSESSMENT AND PLAN:  Ms. Garlock is a 70 year old female with severe single-  vessel coronary artery disease.  She has moderate residual disease in the  remainder of her vessels that will be treated aggressively with Plavix and  statin therapy.  She will also be counseled in regards to smoking cessation  and aggressive risk factor modification pursued.                                               Veneda Melter, M.D.    Melton Alar  D:  04/10/2003  T:  04/10/2003  Job:  [id number]   cc:   Jackson Latino, M.D.

## 2010-07-16 NOTE — Discharge Summary (Signed)
NAMEMarland [name]  [name], [name]                           ACCOUNT NO.:  [id number]   MEDICAL RECORD NO.:  [id number]                   PATIENT TYPE:  INP   LOCATION:  6525                                 FACILITY:  [facility]   PHYSICIAN:  Learta Codding, M.D.                 DATE OF BIRTH:  19-Jun-1940   DATE OF ADMISSION:  04/09/2003  DATE OF DISCHARGE:  04/11/2003                           DISCHARGE SUMMARY - REFERRING   HISTORY:  [name] [name] is a 70 year old white female who is transferred from  [facility] on April 09, 2003 after she presented with a three plus  month history of substernal chest discomfort which she described as gradual  or sudden onset as a soreness without radiation. The symptoms would usually  last 15-30 minutes with her worst episode occurring a day prior to admission  at rest. She gave this a 10 on a scale of 0-10. It lasted approximately 30  minutes associated with shortness of breath and belching. Over the preceding  several months she has been taking several types of antacids without relief.  She has not had any prior cardiac workup.   PAST MEDICAL HISTORY:  1. Hypertension.  2. Hyperlipidemia.  3. Tobacco use.  4. Chronic obstructive pulmonary disease.  5. Possible diabetes.  6. Over weight.  7. Hypothyroidism.  8. GERD.   LABORATORY DATA:  At [facility] fasting lipids showed a total  cholesterol of 166, HDL 44, triglycerides 95, LDL 103. Initial CK was 72,  4.9, troponin 0.24. Second CK was 79 and 6.9, troponin 0.31. Sodium was 137,  potassium 34, BUN 13, creatinine 0.8, glucose 228. PT 12.2, PTT 20.7. H&H  15.1 and 45.3, normal indices. Platelets 309, WBCs 10.4. Chest x-ray did not  show any active processes. EKG showed sinus bradycardia. Normal sinus  rhythm. Nonspecific ST-T wave changes at [facility]. Subsequent  hematologies and chemistries were unremarkable.   HOSPITAL COURSE:  [name] [name] was transferred to [facility] to  undergo  cardiac catheterization. This was performed on April 10, 2003 by  Dr. Chales Abrahams. Her EF was 55% with 1+ MR. She had 30% mid LAD, 50% proximal  circumflex, 50% proximal RCA, 90% mid RCA, and a 30% distal RCA. Dr. Chales Abrahams  utilizing a Cipher stent reduced the 90% mid RCA to 0%. He recommended  Plavix for nine months and to begin a statin. Post sheath removal on bed  rest. The patient was ambulating without difficulty. Lavella Hammock said  that she had a small hematoma at her catheterization site. Smoking cessation  consult was performed on February 10. By February 11, Dr. Chales Abrahams reviewed and  felt that the patient could be discharged home. It was noted prior to  discharge she did have some potassium supplementation for a potassium of  3.4. NTSH and hemoglobin A1C were added to her morning labs to review  optimal control in regards to her hypothyroidism  and possible diabetes.  Cardiac rehab is also pending at the time of dictation as well as case  management for possible financial assistance in regards to her medications.   DISCHARGE DIAGNOSES:  1. Non-Q-wave myocardial infarction, transferred from [facility].  2. Status post Cipher stenting to the mid RCA with residual non-obstructive     coronary artery disease.  3. Hypokalemia.  4. History of chronic obstructive pulmonary disease with continued tobacco     use.  5. Hyperglycemia, questionable history of diabetes.  6. Hyperlipidemia with an elevated LDL of 103, statin was started.   DISPOSITION:  She is discharged home.   MEDICATIONS:  1. Plavix 75 mg daily for nine months.  2. Lipitor 20 mg p.o. q.h.s.  3. K-Dur 40 mEq daily.  4. Nitroglycerin 0.4 as needed.   She was asked to continue what are believed to be her home medications.  These include:  1. Coated aspirin 325 daily.  2. Protonix 40 mg daily.  3. Mavik 2 mg daily.  4. Lopid 600 mg b.i.d..  5. Tiazac 240 daily.  6. HCTZ 25 mg daily.  7. Synthroid 0.15 mg daily.   8. Lopressor 50 mg half a tablet b.i.d.   DISCHARGE INSTRUCTIONS:  She was advised no lifting, driving, sexual  activity, or heavy exertion for one week. Maintain low-salt, low-fat, low-  cholesterol diet. If She had any problems with her catheterization site she  was asked to call immediately. She was advised no smoking or tobacco  products. She is to arrange a follow-up appointment with Dr. Raul Del in  regards to possible diagnosis of diabetes. She will see Dr. Andee Lineman on March  2 at 12:15 p.m. At that time arrangements should be made for a probable BMP,  since we have made adjustments to her medications and potassium  supplementation. Also fasting lipids and LFTs should be arranged for six to  eight weeks since Lipitor was initiated. Compliance with cardiac risk factor  modification should also be pursued. At the time of discharge, the patient  was also told that since she does not have a list of her home medications  that if there is any question as to what she is taking to please call the  office or ask for a pharmacist. She was asked to bring all medications to  all appointments.      Joellyn Rued, P.A. [facility]                    Learta Codding, M.D.    EW/MEDQ  D:  04/11/2003  T:  04/11/2003  Job:  [id number]   cc:   Learta Codding, M.D.   Nena Jordan

## 2010-09-27 DIAGNOSIS — I4891 Unspecified atrial fibrillation: Secondary | ICD-10-CM

## 2010-09-27 DIAGNOSIS — R079 Chest pain, unspecified: Secondary | ICD-10-CM

## 2010-10-05 ENCOUNTER — Other Ambulatory Visit: Payer: Self-pay | Admitting: *Deleted

## 2010-10-05 MED ORDER — ROSUVASTATIN CALCIUM 10 MG PO TABS: 10.0000 mg | ORAL_TABLET | Freq: Every day | ORAL | Status: AC

## 2010-10-15 ENCOUNTER — Encounter: Payer: Medicare Other | Admitting: Cardiology

## 2010-10-18 ENCOUNTER — Encounter: Payer: Self-pay | Admitting: Cardiology

## 2011-11-12 DIAGNOSIS — R079 Chest pain, unspecified: Secondary | ICD-10-CM

## 2011-12-30 ENCOUNTER — Telehealth: Payer: Self-pay | Admitting: *Deleted

## 2011-12-30 ENCOUNTER — Ambulatory Visit (INDEPENDENT_AMBULATORY_CARE_PROVIDER_SITE_OTHER): Payer: Medicare Other | Admitting: Cardiology

## 2011-12-30 ENCOUNTER — Encounter: Payer: Self-pay | Admitting: Cardiology

## 2011-12-30 VITALS — BP 130/63 | HR 61 | Ht 62.0 in | Wt 152.0 lb

## 2011-12-30 DIAGNOSIS — I2510 Atherosclerotic heart disease of native coronary artery without angina pectoris: Secondary | ICD-10-CM

## 2011-12-30 DIAGNOSIS — F17200 Nicotine dependence, unspecified, uncomplicated: Secondary | ICD-10-CM

## 2011-12-30 DIAGNOSIS — I10 Essential (primary) hypertension: Secondary | ICD-10-CM

## 2011-12-30 DIAGNOSIS — E785 Hyperlipidemia, unspecified: Secondary | ICD-10-CM

## 2011-12-30 DIAGNOSIS — I4891 Unspecified atrial fibrillation: Secondary | ICD-10-CM

## 2011-12-30 NOTE — Assessment & Plan Note (Signed)
She has not been able to quit smoking. 

## 2011-12-30 NOTE — Assessment & Plan Note (Signed)
Blood pressure is reasonable today. No changes made. 

## 2011-12-30 NOTE — Assessment & Plan Note (Signed)
Followed by Dr. Sherril Croon, on Crestor.

## 2011-12-30 NOTE — Progress Notes (Signed)
Clinical Summary [name] [name] is a 71 y.o.female presenting for office followup. She is a former patient of Dr. Andee Lineman, prefers to stay with the [facility] practice.  She was last seen back in April. Interval records reviewed including hospitalization at [facility] in September with an episode of rapid atrial fibrillation that spontaneously converted to sinus rhythm with rate control. She has declined anticoagulant therapy.  She states that she had been placed on diltiazem CD with Toprol XL, however was not able to tolerated this citing some stomach difficulties. She was placed then on Coreg although continued on Toprol XL and now states that she seems more short of breath.  ECG today shows sinus bradycardia at 57 beats per minute with nonspecific ST changes. No active angina symptoms. LV function was normal by her last assessment.   Allergies  Allergen Reactions  . Codeine     REACTION: stomach upset  . Ciprofloxacin Nausea And Vomiting    Current Outpatient Prescriptions  Medication Sig Dispense Refill  . albuterol (PROAIR HFA) 108 (90 BASE) MCG/ACT inhaler Inhale 2 puffs into the lungs every 6 (six) hours as needed.        Marland Kitchen albuterol (PROVENTIL) (2.5 MG/3ML) 0.083% nebulizer solution Take 2.5 mg by nebulization every 6 (six) hours as needed.        Marland Kitchen aspirin 325 MG tablet Take 325 mg by mouth daily.        . Calcium Carbonate-Vit D-Min (CALCIUM 1200) 1200-1000 MG-UNIT CHEW Chew 1 capsule by mouth 2 (two) times daily.        Marland Kitchen dexlansoprazole (DEXILANT) 60 MG capsule Take 60 mg by mouth daily.        . diazepam (VALIUM) 5 MG tablet Take 5 mg by mouth every 12 (twelve) hours as needed.       Marland Kitchen glipiZIDE (GLUCOTROL) 10 MG tablet Take 10 mg by mouth daily.      . hydrochlorothiazide (MICROZIDE) 12.5 MG capsule Take 12.5 mg by mouth daily.      Marland Kitchen HYDROcodone-acetaminophen (VICODIN) 5-500 MG per tablet Take 1 tablet by mouth every 12 (twelve) hours as needed.        Marland Kitchen ipratropium (ATROVENT)  0.02 % nebulizer solution Take 500 mcg by nebulization as directed.        Marland Kitchen levothyroxine (SYNTHROID, LEVOTHROID) 100 MCG tablet Take 100 mcg by mouth daily.        Marland Kitchen lisinopril (PRINIVIL,ZESTRIL) 10 MG tablet Take 10 mg by mouth daily.        . metoprolol succinate (TOPROL-XL) 25 MG 24 hr tablet Take 25 mg by mouth daily.        . nitroGLYCERIN (NITROLINGUAL) 0.4 MG/SPRAY spray Place 1 spray under the tongue every 5 (five) minutes as needed.        . potassium chloride SA (K-DUR,KLOR-CON) 20 MEQ tablet Take 20 mEq by mouth daily.        . rosuvastatin (CRESTOR) 10 MG tablet Take 1 tablet (10 mg total) by mouth daily.  30 tablet  6    Past Medical History  Diagnosis Date  . Essential hypertension, benign   . Hyperlipidemia   . Coronary atherosclerosis of native coronary artery     DES RCA 2005, DES circ 2012  . Atrial fibrillation     Paroxysmal - declined anticoagulation  . COPD (chronic obstructive pulmonary disease)   . Type 2 diabetes mellitus   . NSTEMI (non-ST elevated myocardial infarction)   . Carotid artery disease   .  Cardiomyopathy     LVEF 35% up to 60%  . Hypothyroidism     Social History Ms. Delvecchio reports that she has been smoking Cigarettes.  She started smoking about 47 years ago. She has a 22.5 pack-year smoking history. She has never used smokeless tobacco. Ms. Degraff reports that she does not drink alcohol.  Review of Systems No palpitations since hospital discharge. Reports chronic problems with bronchitis, and continues to smoke cigarettes. No reported bleeding episodes. No syncope. Otherwise negative.  Physical Examination Filed Vitals:   12/30/11 1405  BP: 130/63  Pulse: 61   Filed Weights   12/30/11 1405  Weight: 152 lb (68.947 kg)   Patient in no acute distress. HEENT: Conjunctiva and lids normal, oropharynx clear. Neck: Supple, no elevated JVP, no thyromegaly. Lungs: Diminished, scattered rhonchi with prolonged expiratory phase,, nonlabored  breathing at rest. Cardiac: Regular rate and rhythm, no S3, soft systolic murmur, no pericardial rub. Abdomen: Soft, nontender, bowel sounds present. Extremities: No pitting edema, distal pulses 2+. Skin: Warm and dry. Musculoskeletal: No kyphosis. Neuropsychiatric: Alert and oriented x3, affect grossly appropriate.   Problem List and Plan   CAD, NATIVE VESSEL Continue medical therapy at this time. She reports no active angina. Last intervention was DES to the circumflex in March of 2012. She is now off DAPT. Last assessment of EF was normal.  Atrial fibrillation Paroxysmal. Her thromboembolic risk warrants anticoagulation, however she declines this. I asked her to consider the matter, particularly if she continues to manifest recurrent arrhythmias. For now she will stay on aspirin.  We will also stop Coreg as she does not need to be on 2 different beta blockers, continue Toprol-XL.  Essential hypertension, benign Blood pressure is reasonable today. No changes made.  TOBACCO ABUSE She has not been able to quit smoking.  HYPERLIPIDEMIA-MIXED Followed by Dr. Sherril Croon, on Crestor.    Jonelle Sidle, M.D., F.A.C.C.

## 2011-12-30 NOTE — Patient Instructions (Addendum)
Your physician recommends that you schedule a follow-up appointment in: 3 months.  Your physician has recommended you make the following change in your medication: stop carvedilol 6.25 mg. All other medications will remain the same. Please call us when you get home to let us know if you have metoprolol succinate 25 mg.

## 2011-12-30 NOTE — Assessment & Plan Note (Signed)
Paroxysmal. Her thromboembolic risk warrants anticoagulation, however she declines this. I asked her to consider the matter, particularly if she continues to manifest recurrent arrhythmias. For now she will stay on aspirin.  We will also stop Coreg as she does not need to be on 2 different beta blockers, continue Toprol-XL.

## 2011-12-30 NOTE — Assessment & Plan Note (Signed)
Continue medical therapy at this time. She reports no active angina. Last intervention was DES to the circumflex in March of 2012. She is now off DAPT. Last assessment of EF was normal.

## 2011-12-30 NOTE — Telephone Encounter (Signed)
Patient called and confirmed that she is taking toprol xl 25 mg daily and has refills on this medication.

## 2012-04-04 ENCOUNTER — Ambulatory Visit: Payer: Medicare Other | Admitting: Cardiology

## 2012-06-11 ENCOUNTER — Ambulatory Visit (INDEPENDENT_AMBULATORY_CARE_PROVIDER_SITE_OTHER): Payer: Medicare Other | Admitting: Cardiology

## 2012-06-11 ENCOUNTER — Encounter: Payer: Self-pay | Admitting: Cardiology

## 2012-06-11 VITALS — BP 175/75 | HR 49 | Ht 63.0 in | Wt 152.8 lb

## 2012-06-11 DIAGNOSIS — I2510 Atherosclerotic heart disease of native coronary artery without angina pectoris: Secondary | ICD-10-CM

## 2012-06-11 DIAGNOSIS — I6529 Occlusion and stenosis of unspecified carotid artery: Secondary | ICD-10-CM

## 2012-06-11 DIAGNOSIS — I4891 Unspecified atrial fibrillation: Secondary | ICD-10-CM

## 2012-06-11 DIAGNOSIS — I10 Essential (primary) hypertension: Secondary | ICD-10-CM

## 2012-06-11 MED ORDER — HYDROCHLOROTHIAZIDE 25 MG PO TABS: 25.0000 mg | ORAL_TABLET | Freq: Every day | ORAL | Status: DC

## 2012-06-11 NOTE — Assessment & Plan Note (Signed)
Continue current regimen. She prefers aspirin, does not want to initiate anticoagulant. We have discussed this over time.

## 2012-06-11 NOTE — Assessment & Plan Note (Signed)
Continue medical therapy and observation. No active angina at this time.

## 2012-06-11 NOTE — Patient Instructions (Addendum)
Your physician recommends that you schedule a follow-up appointment in: 6 months. You will receive a reminder letter in the mail in about 4 months reminding you to call and schedule your appointment. If you don't receive this letter, please contact our office. Your physician has recommended you make the following change in your medication: Increase your hydrochlorothiazide to 25 mg daily. You may take (2) of your 12.5 mg until they are finished. Your new prescription has been sent to your pharmacy. All other medications will remain the same.

## 2012-06-11 NOTE — Assessment & Plan Note (Signed)
She is due for followup carotid Dopplers.

## 2012-06-11 NOTE — Progress Notes (Signed)
Clinical Summary [name] [name] is a 72 y.o.female last seen in November 2013. She reports no angina symptoms, no palpitations.  She has a history of PAF, anticoagulation has been discussed, although she has declined this.  She does state that her blood pressure has been elevated in the mornings. She brings in home blood pressure checks that show systolics in the 150 to 200 range. She reports compliance with her medications. She will be seeing Dr. Sherril Croon next week.  She has not had recent followup carotid Dopplers.   Allergies  Allergen Reactions  . Codeine     REACTION: stomach upset  . Ciprofloxacin Nausea And Vomiting    Current Outpatient Prescriptions  Medication Sig Dispense Refill  . albuterol (PROAIR HFA) 108 (90 BASE) MCG/ACT inhaler Inhale 2 puffs into the lungs every 6 (six) hours as needed.        Marland Kitchen albuterol (PROVENTIL) (2.5 MG/3ML) 0.083% nebulizer solution Take 2.5 mg by nebulization every 6 (six) hours as needed.        Marland Kitchen aspirin 325 MG tablet Take 325 mg by mouth daily.        . Calcium Carbonate-Vit D-Min (CALCIUM 1200) 1200-1000 MG-UNIT CHEW Chew 1 capsule by mouth 2 (two) times daily.        Marland Kitchen dexlansoprazole (DEXILANT) 60 MG capsule Take 60 mg by mouth daily.        . diazepam (VALIUM) 5 MG tablet Take 5 mg by mouth every 12 (twelve) hours as needed.       Marland Kitchen glipiZIDE (GLUCOTROL) 10 MG tablet Take 10 mg by mouth daily.      Marland Kitchen HYDROcodone-acetaminophen (VICODIN) 5-500 MG per tablet Take 1 tablet by mouth every 12 (twelve) hours as needed.        Marland Kitchen ipratropium (ATROVENT) 0.02 % nebulizer solution Take 500 mcg by nebulization as directed.        Marland Kitchen levothyroxine (SYNTHROID, LEVOTHROID) 100 MCG tablet Take 100 mcg by mouth daily.        Marland Kitchen lisinopril (PRINIVIL,ZESTRIL) 40 MG tablet Take 40 mg by mouth daily.      . metoprolol succinate (TOPROL-XL) 25 MG 24 hr tablet Take 25 mg by mouth daily.        . nitroGLYCERIN (NITROLINGUAL) 0.4 MG/SPRAY spray Place 1 spray under the  tongue every 5 (five) minutes as needed.        . potassium chloride SA (K-DUR,KLOR-CON) 20 MEQ tablet Take 20 mEq by mouth daily.        . rosuvastatin (CRESTOR) 10 MG tablet Take 1 tablet (10 mg total) by mouth daily.  30 tablet  6  . hydrochlorothiazide (HYDRODIURIL) 25 MG tablet Take 1 tablet (25 mg total) by mouth daily.  90 tablet  3   No current facility-administered medications for this visit.    Past Medical History  Diagnosis Date  . Essential hypertension, benign   . Hyperlipidemia   . Coronary atherosclerosis of native coronary artery     DES RCA 2005, DES circ 2012  . Atrial fibrillation     Paroxysmal - declined anticoagulation  . COPD (chronic obstructive pulmonary disease)   . Type 2 diabetes mellitus   . NSTEMI (non-ST elevated myocardial infarction)   . Carotid artery disease   . Cardiomyopathy     LVEF 35% up to 60%  . Hypothyroidism     Social History [name] [name] reports that she quit smoking 3 days ago. Her smoking use included Cigarettes. She started smoking about  48 years ago. She has a 22.5 pack-year smoking history. She has never used smokeless tobacco. [name] [name] reports that she does not drink alcohol.  Review of Systems Negative except as outlined.  Physical Examination Filed Vitals:   06/11/12 1305  BP: 175/75  Pulse: 49   Filed Weights   06/11/12 1305  Weight: 152 lb 12.8 oz (69.31 kg)   Patient in no acute distress.  HEENT: Conjunctiva and lids normal, oropharynx clear.  Neck: Supple, no elevated JVP, soft left carotid bruit, no thyromegaly.  Lungs: Diminished, scattered rhonchi with prolonged expiratory phase,, nonlabored breathing at rest.  Cardiac: Regular rate and rhythm, no S3, soft systolic murmur, no pericardial rub.  Abdomen: Soft, nontender, bowel sounds present.  Extremities: No pitting edema, distal pulses 2+.  Skin: Warm and dry.  Musculoskeletal: No kyphosis.  Neuropsychiatric: Alert and oriented x3, affect grossly  appropriate.   Problem List and Plan   CAD, NATIVE VESSEL Continue medical therapy and observation. No active angina at this time.  Atrial fibrillation Continue current regimen. She prefers aspirin, does not want to initiate anticoagulant. We have discussed this over time.  CAROTID ARTERY DISEASE She is due for followup carotid Dopplers.  Essential hypertension, benign Blood pressure trend increasing. Discussed sodium restriction. Increase hydrochlorothiazide to 25 mg daily, keep other medicines stable. Followup with Dr. Sherril Croon next week. If additional agent as needed, consider Norvasc.    Jonelle Sidle, M.D., F.A.C.C.

## 2012-06-11 NOTE — Assessment & Plan Note (Signed)
Blood pressure trend increasing. Discussed sodium restriction. Increase hydrochlorothiazide to 25 mg daily, keep other medicines stable. Followup with Dr. Sherril Croon next week. If additional agent as needed, consider Norvasc.

## 2012-06-14 DIAGNOSIS — I4891 Unspecified atrial fibrillation: Secondary | ICD-10-CM

## 2012-06-14 DIAGNOSIS — R0989 Other specified symptoms and signs involving the circulatory and respiratory systems: Secondary | ICD-10-CM

## 2012-06-14 DIAGNOSIS — R079 Chest pain, unspecified: Secondary | ICD-10-CM

## 2012-07-06 ENCOUNTER — Encounter: Payer: Self-pay | Admitting: Cardiology

## 2012-07-19 ENCOUNTER — Encounter (INDEPENDENT_AMBULATORY_CARE_PROVIDER_SITE_OTHER): Payer: Medicare Other

## 2012-07-19 DIAGNOSIS — I6529 Occlusion and stenosis of unspecified carotid artery: Secondary | ICD-10-CM

## 2012-07-19 DIAGNOSIS — R0989 Other specified symptoms and signs involving the circulatory and respiratory systems: Secondary | ICD-10-CM

## 2012-07-26 ENCOUNTER — Encounter: Payer: Self-pay | Admitting: *Deleted

## 2012-09-05 DIAGNOSIS — I499 Cardiac arrhythmia, unspecified: Secondary | ICD-10-CM

## 2012-09-06 ENCOUNTER — Encounter (HOSPITAL_COMMUNITY): Payer: Self-pay | Admitting: Cardiology

## 2012-09-06 ENCOUNTER — Observation Stay (HOSPITAL_COMMUNITY)
Admission: AD | Admit: 2012-09-06 | Discharge: 2012-09-07 | Disposition: A | Discharge status: Home / Self Care | Payer: Medicare Other | Source: Other Acute Inpatient Hospital | Attending: Cardiology | Admitting: Cardiology

## 2012-09-06 DIAGNOSIS — I480 Paroxysmal atrial fibrillation: Secondary | ICD-10-CM

## 2012-09-06 DIAGNOSIS — F17200 Nicotine dependence, unspecified, uncomplicated: Secondary | ICD-10-CM | POA: Insufficient documentation

## 2012-09-06 DIAGNOSIS — E785 Hyperlipidemia, unspecified: Secondary | ICD-10-CM | POA: Diagnosis present

## 2012-09-06 DIAGNOSIS — J441 Chronic obstructive pulmonary disease with (acute) exacerbation: Secondary | ICD-10-CM | POA: Diagnosis present

## 2012-09-06 DIAGNOSIS — I10 Essential (primary) hypertension: Secondary | ICD-10-CM

## 2012-09-06 DIAGNOSIS — R001 Bradycardia, unspecified: Secondary | ICD-10-CM

## 2012-09-06 DIAGNOSIS — I498 Other specified cardiac arrhythmias: Principal | ICD-10-CM | POA: Insufficient documentation

## 2012-09-06 DIAGNOSIS — I6529 Occlusion and stenosis of unspecified carotid artery: Secondary | ICD-10-CM | POA: Insufficient documentation

## 2012-09-06 DIAGNOSIS — I2510 Atherosclerotic heart disease of native coronary artery without angina pectoris: Secondary | ICD-10-CM | POA: Insufficient documentation

## 2012-09-06 DIAGNOSIS — Z9861 Coronary angioplasty status: Secondary | ICD-10-CM | POA: Diagnosis present

## 2012-09-06 DIAGNOSIS — I495 Sick sinus syndrome: Secondary | ICD-10-CM

## 2012-09-06 DIAGNOSIS — E782 Mixed hyperlipidemia: Secondary | ICD-10-CM | POA: Insufficient documentation

## 2012-09-06 DIAGNOSIS — J4489 Other specified chronic obstructive pulmonary disease: Secondary | ICD-10-CM | POA: Insufficient documentation

## 2012-09-06 DIAGNOSIS — J449 Chronic obstructive pulmonary disease, unspecified: Secondary | ICD-10-CM | POA: Insufficient documentation

## 2012-09-06 DIAGNOSIS — I4891 Unspecified atrial fibrillation: Secondary | ICD-10-CM | POA: Insufficient documentation

## 2012-09-06 HISTORY — DX: Bradycardia, unspecified: R00.1

## 2012-09-06 LAB — CBC
HCT: 43.4 % (ref 36.0–46.0)
RBC: 4.62 MIL/uL (ref 3.87–5.11)
RDW: 13.4 % (ref 11.5–15.5)
WBC: 9.4 10*3/uL (ref 4.0–10.5)

## 2012-09-06 LAB — BASIC METABOLIC PANEL
BUN: 15 mg/dL (ref 6–23)
CO2: 25 mEq/L (ref 19–32)
Chloride: 98 mEq/L (ref 96–112)
GFR calc Af Amer: >90 mL/min (ref >90)
Potassium: 3.4 mEq/L — ABNORMAL LOW (ref 3.5–5.1)

## 2012-09-06 LAB — TSH: TSH: 3.172 u[IU]/mL (ref 0.350–4.500)

## 2012-09-06 LAB — GLUCOSE, CAPILLARY: Glucose-Capillary: 131 mg/dL — ABNORMAL HIGH (ref 70–99)

## 2012-09-06 LAB — TROPONIN I: Troponin I: <0.3 ng/mL (ref <0.30)

## 2012-09-06 MED ORDER — ONDANSETRON HCL 4 MG/2ML IJ SOLN: 4.0000 mg | Freq: Four times a day (QID) | INTRAMUSCULAR | Status: DC | PRN

## 2012-09-06 MED ORDER — ASPIRIN 325 MG PO TABS
325.0000 mg | ORAL_TABLET | Freq: Every day | ORAL | Status: DC
  Administered 2012-09-06 – 2012-09-07 (×2): 325 mg via ORAL
  Filled 2012-09-06 (×2): qty 1

## 2012-09-06 MED ORDER — LEVOTHYROXINE SODIUM 100 MCG PO TABS
100.0000 ug | ORAL_TABLET | Freq: Every day | ORAL | Status: DC
  Administered 2012-09-06 – 2012-09-07 (×2): 100 ug via ORAL
  Filled 2012-09-06 (×3): qty 1

## 2012-09-06 MED ORDER — HEPARIN SODIUM (PORCINE) 5000 UNIT/ML IJ SOLN
5000.0000 [IU] | Freq: Three times a day (TID) | INTRAMUSCULAR | Status: DC
  Administered 2012-09-06 – 2012-09-07 (×4): 5000 [IU] via SUBCUTANEOUS
  Filled 2012-09-06 (×7): qty 1

## 2012-09-06 MED ORDER — POTASSIUM CHLORIDE CRYS ER 20 MEQ PO TBCR
20.0000 meq | EXTENDED_RELEASE_TABLET | Freq: Every day | ORAL | Status: DC
  Administered 2012-09-06 – 2012-09-07 (×2): 20 meq via ORAL
  Filled 2012-09-06 (×2): qty 1

## 2012-09-06 MED ORDER — ATORVASTATIN CALCIUM 20 MG PO TABS
20.0000 mg | ORAL_TABLET | Freq: Every day | ORAL | Status: DC
  Administered 2012-09-06: 20 mg via ORAL
  Filled 2012-09-06 (×2): qty 1

## 2012-09-06 MED ORDER — ACETAMINOPHEN 325 MG PO TABS: 650.0000 mg | ORAL_TABLET | ORAL | Status: DC | PRN

## 2012-09-06 MED ORDER — PANTOPRAZOLE SODIUM 40 MG PO TBEC
40.0000 mg | DELAYED_RELEASE_TABLET | Freq: Every day | ORAL | Status: DC
  Administered 2012-09-06: 40 mg via ORAL
  Filled 2012-09-06: qty 1

## 2012-09-06 MED ORDER — GLIPIZIDE 10 MG PO TABS
10.0000 mg | ORAL_TABLET | Freq: Every day | ORAL | Status: DC
  Administered 2012-09-06 – 2012-09-07 (×2): 10 mg via ORAL
  Filled 2012-09-06 (×3): qty 1

## 2012-09-06 MED ORDER — NITROGLYCERIN 0.4 MG SL SUBL: 0.4000 mg | SUBLINGUAL_TABLET | SUBLINGUAL | Status: DC | PRN

## 2012-09-06 MED ORDER — ALBUTEROL SULFATE (5 MG/ML) 0.5% IN NEBU: 2.5000 mg | INHALATION_SOLUTION | Freq: Four times a day (QID) | RESPIRATORY_TRACT | Status: DC | PRN

## 2012-09-06 MED ORDER — LISINOPRIL 40 MG PO TABS
40.0000 mg | ORAL_TABLET | Freq: Every day | ORAL | Status: DC
  Administered 2012-09-06 – 2012-09-07 (×2): 40 mg via ORAL
  Filled 2012-09-06 (×2): qty 1

## 2012-09-06 NOTE — H&P (Signed)
Physician History and Physical    [name] [name] MRN: [id number] DOB/AGE: [birth year] 72 y.o. Admit date: 09/06/2012  Primary Cardiologist:  Valera Castle  CC:  Sinus bradycardia  HPI:  72 yo female with h/o Afib currently on rate control agents (Metoprolol 25 mg daily and Diltiazem 180 mg daily), CAD s/p PCI, COPD and HTN who is transferred from [facility] for symptomatic bradycardia.  Patient felt dizzy the day before yesterday and called her PCP who discontinued her Metoprolol, she checked her pulse yesterday and found her HR at high 30' to low 63' with normal BP, she again had lightheadedness and went to ER, where she was found to have remarkable sinus bradycardia. She was subsequently transferred to [facility] to rule out ischemia etiology.  On interview, she denied any CP/SOB, no syncope/presyncope. Her symptoms has since resolved.   Review of systems: A review of 10 organ systems was done and is negative except as stated above in HPI  Past Medical History  Diagnosis Date  . Essential hypertension, benign   . Hyperlipidemia   . Coronary atherosclerosis of native coronary artery     DES RCA 2005, DES circ 2012  . Atrial fibrillation     Paroxysmal - declined anticoagulation  . COPD (chronic obstructive pulmonary disease)   . Type 2 diabetes mellitus   . NSTEMI (non-ST elevated myocardial infarction)   . Carotid artery disease   . Cardiomyopathy     LVEF 35% up to 60%  . Hypothyroidism    Past Surgical History  Procedure Laterality Date  . Appendectomy    . Cholecystectomy    . Abdominal hysterectomy    . Excision of melanoma  04/29/2010    Lower left neck   History   Social History  . Marital Status: Widowed    Spouse Name: N/A    Number of Children: N/A  . Years of Education: N/A   Occupational History  . Not on file.   Social History Main Topics  . Smoking status: Former Smoker -- 0.50 packs/day for 45 years    Types: Cigarettes    Start date: 02/29/1964    Quit  date: 06/08/2012  . Smokeless tobacco: Never Used  . Alcohol Use: No  . Drug Use: No  . Sexually Active: Not on file   Other Topics Concern  . Not on file   Social History Narrative  . No narrative on file    No family history on file.   Allergies  Allergen Reactions  . Codeine     REACTION: stomach upset  . Ciprofloxacin Nausea And Vomiting    Prescriptions prior to admission  Medication Sig Dispense Refill  . albuterol (PROAIR HFA) 108 (90 BASE) MCG/ACT inhaler Inhale 2 puffs into the lungs every 6 (six) hours as needed.        Marland Kitchen albuterol (PROVENTIL) (2.5 MG/3ML) 0.083% nebulizer solution Take 2.5 mg by nebulization every 6 (six) hours as needed.        Marland Kitchen aspirin 325 MG tablet Take 325 mg by mouth daily.        . Calcium Carbonate-Vit D-Min (CALCIUM 1200) 1200-1000 MG-UNIT CHEW Chew 1 capsule by mouth 2 (two) times daily.        Marland Kitchen dexlansoprazole (DEXILANT) 60 MG capsule Take 60 mg by mouth daily.        . diazepam (VALIUM) 5 MG tablet Take 5 mg by mouth every 12 (twelve) hours as needed.       Marland Kitchen glipiZIDE (  GLUCOTROL) 10 MG tablet Take 10 mg by mouth daily.      . hydrochlorothiazide (HYDRODIURIL) 25 MG tablet Take 1 tablet (25 mg total) by mouth daily.  90 tablet  3  . HYDROcodone-acetaminophen (VICODIN) 5-500 MG per tablet Take 1 tablet by mouth every 12 (twelve) hours as needed.        Marland Kitchen ipratropium (ATROVENT) 0.02 % nebulizer solution Take 500 mcg by nebulization as directed.        Marland Kitchen levothyroxine (SYNTHROID, LEVOTHROID) 100 MCG tablet Take 100 mcg by mouth daily.        Marland Kitchen lisinopril (PRINIVIL,ZESTRIL) 40 MG tablet Take 40 mg by mouth daily.      . metoprolol succinate (TOPROL-XL) 25 MG 24 hr tablet Take 25 mg by mouth daily.        . nitroGLYCERIN (NITROLINGUAL) 0.4 MG/SPRAY spray Place 1 spray under the tongue every 5 (five) minutes as needed.        . potassium chloride SA (K-DUR,KLOR-CON) 20 MEQ tablet Take 20 mEq by mouth daily.        . rosuvastatin (CRESTOR) 10 MG  tablet Take 1 tablet (10 mg total) by mouth daily.  30 tablet  6    No current facility-administered medications for this encounter.  Physical Exam: Blood pressure 156/64, pulse 47, temperature 97.5 F (36.4 C), temperature source Oral, resp. rate 18, height 5\' 3"  (1.6 m), weight 70.58 kg (155 lb 9.6 oz), SpO2 94.00%.; Body mass index is 27.57 kg/(m^2). Temp:  [97.5 F (36.4 C)] 97.5 F (36.4 C) (07/10 0100) Pulse Rate:  [47] 47 (07/10 0100) Resp:  [18] 18 (07/10 0100) BP: (156)/(64) 156/64 mmHg (07/10 0100) SpO2:  [94 %] 94 % (07/10 0100) Weight:  [70.58 kg (155 lb 9.6 oz)] 70.58 kg (155 lb 9.6 oz) (07/10 0100)  No intake or output data in the 24 hours ending 09/06/12 0148 General: NAD Heent: MMM Neck: No JVD  CV: Nondisplaced PMI.  RRR, nl S1/S2, no S3/S4, no murmur. No carotid bruit   Lungs: Clear to auscultation bilaterally with normal respiratory effort Abdomen: Soft, nontender, nondistended Extremities: No clubbing or cyanosis.  Normal pedal pulses. No pedal edema Skin: Intact without lesions or rashes  Neurologic: Alert and oriented x 3, grossly nonfocal  Psych: Normal mood and affect    Labs: No results found for this basename: CKTOTAL, CKMB, TROPONINI,  in the last 72 hours Lab Results  Component Value Date   WBC 9.4 05/25/2010   HGB 14.4 05/25/2010   HCT 43.7 05/25/2010   MCV 95.8 05/25/2010   PLT 256 05/25/2010   No results found for this basename: NA, K, CL, CO2, BUN, CREATININE, CALCIUM, LABALBU, PROT, BILITOT, ALKPHOS, ALT, AST, GLUCOSE,  in the last 168 hours Lab Results  Component Value Date   CHOL  Value: 103        ATP III CLASSIFICATION:  <200     mg/dL   Desirable  130-865  mg/dL   Borderline High  >=784    mg/dL   High        6/96/2952   HDL 45 05/23/2010   LDLCALC  Value: 41        Total Cholesterol/HDL:CHD Risk Coronary Heart Disease Risk Table                     Men   Women  1/2 Average Risk   3.4   3.3  Average Risk       5.0  4.4  2 X Average Risk    9.6   7.1  3 X Average Risk  23.4   11.0        Use the calculated Patient Ratio above and the CHD Risk Table to determine the patient's CHD Risk.        ATP III CLASSIFICATION (LDL):  <100     mg/dL   Optimal  295-621  mg/dL   Near or Above                    Optimal  130-159  mg/dL   Borderline  308-657  mg/dL   High  >846     mg/dL   Very High 9/62/9528   TRIG 87 05/23/2010       EKG:  Sinus bradycardia with occasional PVCs  ASSESSMENT:  Symptomatic sinus bradycardia with stable hemodynamics likely 2/2 rate control agents for Afib.  PLAN:  1. Hold Metop and Diltiazem for now, resume Diltiazem at low dose after bradycardia resolves 2. Check Troponin rule out ischemia 3. Check TSH 4. Continue observation on telemetry 5. If ischemia ruled out,  bradycardia and symptoms improve, likely can discharge home with reduced rate control regimen.   Signed: Haydee Salter, MD Cardiology Fellow 09/06/2012, 1:48 AM

## 2012-09-07 ENCOUNTER — Encounter (HOSPITAL_COMMUNITY): Payer: Self-pay | Admitting: Physician Assistant

## 2012-09-07 DIAGNOSIS — I10 Essential (primary) hypertension: Secondary | ICD-10-CM

## 2012-09-07 DIAGNOSIS — R001 Bradycardia, unspecified: Secondary | ICD-10-CM

## 2012-09-07 DIAGNOSIS — I480 Paroxysmal atrial fibrillation: Secondary | ICD-10-CM

## 2012-09-07 DIAGNOSIS — I498 Other specified cardiac arrhythmias: Principal | ICD-10-CM

## 2012-09-07 DIAGNOSIS — I4891 Unspecified atrial fibrillation: Secondary | ICD-10-CM

## 2012-09-07 LAB — GLUCOSE, CAPILLARY

## 2012-09-07 NOTE — Progress Notes (Signed)
Subjective: No CP  No dizziness  No SOB Objective: Filed Vitals:   09/06/12 1143 09/06/12 1400 09/06/12 1947 09/07/12 0509  BP: 140/62 107/61 134/54 134/47  Pulse:  49 50 50  Temp:  97.6 F (36.4 C) 97.7 F (36.5 C) 98.2 F (36.8 C)  TempSrc:  Oral Oral Oral  Resp:   18 18  Height:      Weight:    154 lb 15.7 oz (70.3 kg)  SpO2:  96% 98% 95%   Weight change: -9.9 oz (-0.28 kg)  Intake/Output Summary (Last 24 hours) at 09/07/12 0816 Last data filed at 09/06/12 2300  Gross per 24 hour  Intake      0 ml  Output      0 ml  Net      0 ml    General: Alert, awake, oriented x3, in no acute distress Neck:  JVP is normal Heart: Regular rate and rhythm, without murmurs, rubs, gallops.  Lungs: Clear to auscultation.  No rales or wheezes. Exemities:  No edema.   Neuro: Grossly intact, nonfocal.  Tele  SB to SR  Rates now in 60s to 70s   Lab Results: Results for orders placed during the hospital encounter of 09/06/12 (from the past 24 hour(s))  TROPONIN I     Status: None   Collection Time    09/06/12 11:30 AM      Result Value Range   Troponin I <0.30  <0.30 ng/mL  TROPONIN I     Status: None   Collection Time    09/06/12  6:43 PM      Result Value Range   Troponin I <0.30  <0.30 ng/mL  GLUCOSE, CAPILLARY     Status: Abnormal   Collection Time    09/06/12  9:22 PM      Result Value Range   Glucose-Capillary 203 (*) 70 - 99 mg/dL   Comment 1 Documented in Chart     Comment 2 Notify RN    GLUCOSE, CAPILLARY     Status: Abnormal   Collection Time    09/07/12  1:41 AM      Result Value Range   Glucose-Capillary 146 (*) 70 - 99 mg/dL  GLUCOSE, CAPILLARY     Status: Abnormal   Collection Time    09/07/12  6:07 AM      Result Value Range   Glucose-Capillary 139 (*) 70 - 99 mg/dL    Studies/Results: @RISRSLT24 @  Medications: Reviewed   @PROBHOSP @  1.  Bradycardia  HR has improved now that meds held  She is not dizzy   Trop neg x 3.   Would recomm ambulating  this AM  If asymptomatic then d/c. Outpatient f/u  Patietn with history of Afib in past  She denies recent palpitations. If recurs will address at that time  (poss PM)  Patient to f/u  With Liliane Channel  2.  HTN  Adequate contol  3.  Hx afib  Patient refused anticoag in past.  Dietrich Pates   LOS: 1 day   Dietrich Pates 09/07/2012, 8:16 AM

## 2012-09-07 NOTE — Discharge Summary (Signed)
Discharge Summary   Patient ID: [name] [name],  MRN: [id number], DOB/AGE: [birth year] 72 y.o.  Admit date: 09/06/2012 Discharge date: 09/07/2012  Primary Physician: Ignatius Specking., MD Primary Cardiologist: Ival Bible, MD  Discharge Diagnoses Principal Problem:   Sinus bradycardia Active Problems:   Paroxysmal atrial fibrillation   CAD, NATIVE VESSEL   HYPERLIPIDEMIA-MIXED   TOBACCO ABUSE   Essential hypertension, benign   CAROTID ARTERY DISEASE   COPD  Allergies Allergies  Allergen Reactions  . Codeine     REACTION: stomach upset  . Ciprofloxacin Nausea And Vomiting   Diagnostic Studies/Procedures  PORTABLE CHEST X-RAY - 09/05/12  Mild hyperinflation and chronic changes. No acute findings.   History of Present Illness  [name] [name] is a 72 y.o. female who was transferred from [facility] to [facility] hospital on 09/06/12 with the above problem list.   She has a history of CAD s/p DES-RCA 2005, DES-LCx 2012, PAF, HTN, HLD, COPD, h/o tobacco abuse and carotid artery disease. She is followed by Ival Bible, [place] in [place]. She was seen in 05/2012 for follow-up. Antihypertensives were adjusted and follow-up carotid dopplers were recommended. CAD and PAF were noted to be stable. She was maintaining NSR on Toprol-XL and Cardizem. She has declined formal anticoagulation.   She began experiencing lightheadedness and bradycardia (HR 30-40s) the date of admission. She denied chest pain or syncope. She was advised to present to the ED.   There, EKG revealed sinus bradycardia rate 40s. Initial trop-I WNL. CXR as above indicated no acute cardiopulmonary abnormalities. AVN blockers were held and she was transferred to [facility] for further work-up.   Hospital Course   She arrived in stable condition and ruled out overnight with three troponins WNL. TSH WNL. HR and symptoms improved with chronotropic washout. She ambulated without incident and was evaluated this morning by Dr.  Tenny Craw who deemed the patient stable for discharge. CCB and BB will be stopped. Rate and rhythm given her PAF history will be monitored in the outpatient setting. Should she require rate-control, this may be limited by sinus bradycardia i.e.tachy-brady syndrome and PPM placement can be considered at that time. She will follow-up in the [place] office as scheduled below. This information, including supplemental educational bradycardia and atrial fibrillation material, has been clearly outlined in the discharge AVS.   Discharge Vitals:  Blood pressure 134/47, pulse 50, temperature 98.2 F (36.8 C), temperature source Oral, resp. rate 18, height 5\' 3"  (1.6 m), weight 70.3 kg (154 lb 15.7 oz), SpO2 95.00%.   Weight change: -0.28 kg (-9.9 oz)  Labs: Recent Labs     09/06/12  0540  WBC  9.4  HGB  15.0  HCT  43.4  MCV  93.9  PLT  200   Recent Labs Lab 09/06/12 0540  NA 137  K 3.4*  CL 98  CO2 25  BUN 15  CREATININE 0.77  CALCIUM 9.1  GLUCOSE 144*   Recent Labs     09/06/12  0540  09/06/12  1130  09/06/12  1843  TROPONINI  <0.30  <0.30  <0.30    Recent Labs  09/06/12 0540  TSH 3.172    Disposition:  Discharge Orders   Future Appointments Provider Department Dept Phone   10/08/2012 2:00 PM Prescott Parma, PA-C [facility] (near [place]) [phone]   Future Orders Complete By Expires     Diet - low sodium heart healthy  As directed     Increase activity slowly  As  directed           Follow-up Information   Follow up with VYAS,DHRUV B., MD In 1 week. (For general post-hospital follow-up. )    Contact information:   [address] [place] [address] [phone]       Follow up with Prescott Parma, PA-C On 10/08/2012. (At 2:00 PM for post-hospital cardiology follow-up.)    Contact information:   [address], [address] [place] [address] [phone]      Discharge Medications:    Medication List         albuterol (2.5 MG/3ML) 0.083% nebulizer solution    Commonly known as:  PROVENTIL  Take 2.5 mg by nebulization every 6 (six) hours as needed.     PROAIR HFA 108 (90 BASE) MCG/ACT inhaler  Generic drug:  albuterol  Inhale 2 puffs into the lungs every 6 (six) hours as needed.     aspirin 325 MG tablet  Take 325 mg by mouth daily.     Calcium 1200 1200-1000 MG-UNIT Chew  Chew 1 capsule by mouth 2 (two) times daily.     DEXILANT 60 MG capsule  Generic drug:  dexlansoprazole  Take 60 mg by mouth daily.     diazepam 5 MG tablet  Commonly known as:  VALIUM  Take 5 mg by mouth every 12 (twelve) hours as needed.     glipiZIDE 10 MG tablet  Commonly known as:  GLUCOTROL  Take 10 mg by mouth daily.     hydrochlorothiazide 25 MG tablet  Commonly known as:  HYDRODIURIL  Take 1 tablet (25 mg total) by mouth daily.     HYDROcodone-acetaminophen 5-500 MG per tablet  Commonly known as:  VICODIN  Take 1 tablet by mouth every 12 (twelve) hours as needed.     ipratropium 0.02 % nebulizer solution  Commonly known as:  ATROVENT  Take 500 mcg by nebulization as directed.     levothyroxine 100 MCG tablet  Commonly known as:  SYNTHROID, LEVOTHROID  Take 100 mcg by mouth daily.     lisinopril 40 MG tablet  Commonly known as:  PRINIVIL,ZESTRIL  Take 40 mg by mouth daily.     nitroGLYCERIN 0.4 MG/SPRAY spray  Commonly known as:  NITROLINGUAL  Place 1 spray under the tongue every 5 (five) minutes as needed.     potassium chloride SA 20 MEQ tablet  Commonly known as:  K-DUR,KLOR-CON  Take 20 mEq by mouth daily.     rosuvastatin 10 MG tablet  Commonly known as:  CRESTOR  Take 1 tablet (10 mg total) by mouth daily.       Outstanding Labs/Studies: None  Duration of Discharge Encounter: Greater than 30 minutes including physician time.  Signed, R. Hurman Horn, PA-C 09/07/2012, 11:26 AM

## 2012-09-13 ENCOUNTER — Telehealth: Payer: Self-pay | Admitting: Physician Assistant

## 2012-09-13 NOTE — Telephone Encounter (Signed)
Has some questions about her blood work and her BP

## 2012-09-13 NOTE — Telephone Encounter (Signed)
Discussed below with patient.  Had questions as to why she needed to keep visit with [place].  Advised pt that this visit was for post hosp follow up.  Patient also stated that she has OV with Dr. Sherril Croon tomorrow at 2:15 as she states she is continuing to have low heart rates.  Post hosp scheduled for 8/11 with Gene Serpe, PA.

## 2012-09-16 DIAGNOSIS — R079 Chest pain, unspecified: Secondary | ICD-10-CM

## 2012-10-08 ENCOUNTER — Encounter: Payer: Medicare Other | Admitting: Physician Assistant

## 2012-12-17 ENCOUNTER — Encounter: Payer: Self-pay | Admitting: Cardiology

## 2012-12-17 ENCOUNTER — Encounter: Payer: Medicare Other | Admitting: Cardiology

## 2012-12-17 NOTE — Progress Notes (Signed)
Patient cancelled   This encounter was created in error - please disregard. 

## 2013-02-11 ENCOUNTER — Encounter: Payer: Self-pay | Admitting: Cardiology

## 2013-03-04 ENCOUNTER — Encounter: Payer: Self-pay | Admitting: Cardiology

## 2013-03-04 ENCOUNTER — Ambulatory Visit (INDEPENDENT_AMBULATORY_CARE_PROVIDER_SITE_OTHER): Payer: Medicare Other | Admitting: Cardiology

## 2013-03-04 VITALS — BP 121/74 | HR 56 | Ht 61.5 in | Wt 149.0 lb

## 2013-03-04 DIAGNOSIS — I480 Paroxysmal atrial fibrillation: Secondary | ICD-10-CM

## 2013-03-04 DIAGNOSIS — I6529 Occlusion and stenosis of unspecified carotid artery: Secondary | ICD-10-CM

## 2013-03-04 DIAGNOSIS — I4891 Unspecified atrial fibrillation: Secondary | ICD-10-CM

## 2013-03-04 DIAGNOSIS — E785 Hyperlipidemia, unspecified: Secondary | ICD-10-CM

## 2013-03-04 DIAGNOSIS — I2510 Atherosclerotic heart disease of native coronary artery without angina pectoris: Secondary | ICD-10-CM

## 2013-03-04 DIAGNOSIS — I10 Essential (primary) hypertension: Secondary | ICD-10-CM

## 2013-03-04 NOTE — Assessment & Plan Note (Signed)
She continues to decline anticoagulation. Will stay on aspirin and Cardizem CD for now.

## 2013-03-04 NOTE — Assessment & Plan Note (Signed)
Good blood pressure control today. 

## 2013-03-04 NOTE — Assessment & Plan Note (Signed)
She continues on Crestor, lipid followup with Dr. Sherril CroonVyas.

## 2013-03-04 NOTE — Assessment & Plan Note (Signed)
Carotid Dopplers from June 2014 reviewed. Can consider repeat study around the time of her next visit.

## 2013-03-04 NOTE — Assessment & Plan Note (Signed)
Symptomatically stable medical therapy following interventions as outlined above. Continue current regimen and observation, followup in 6 months.

## 2013-03-04 NOTE — Patient Instructions (Signed)

## 2013-03-04 NOTE — Progress Notes (Signed)
Clinical Summary [name] [name] is a 73 y.o.female last seen in April 2014. She is doing well without any angina symptoms. Did have an episode of brief breakthrough atrial fibrillation in the last few months, still prefers to stay on aspirin. She has declined anticoagulation for PAF.  Followup carotid Dopplers in June 2014 showed 0-39% RICA stenosis and 40-59% LICA stenosis. Lab work from July 2014 showed BUN 13, creatinine 0.7, normal LFTs, potassium 3.6.  She reports compliance with her medications, no nitroglycerin requirement.   Allergies  Allergen Reactions  . Codeine     REACTION: stomach upset  . Ciprofloxacin Nausea And Vomiting    Current Outpatient Prescriptions  Medication Sig Dispense Refill  . albuterol (PROAIR HFA) 108 (90 BASE) MCG/ACT inhaler Inhale 2 puffs into the lungs every 6 (six) hours as needed.        Marland Kitchen albuterol (PROVENTIL) (2.5 MG/3ML) 0.083% nebulizer solution Take 2.5 mg by nebulization every 6 (six) hours as needed.        Marland Kitchen aspirin 325 MG tablet Take 325 mg by mouth daily.        . Calcium Carbonate-Vit D-Min (CALCIUM 1200) 1200-1000 MG-UNIT CHEW Chew 1 capsule by mouth 2 (two) times daily.        Marland Kitchen dexlansoprazole (DEXILANT) 60 MG capsule Take 60 mg by mouth daily.        . diazepam (VALIUM) 5 MG tablet Take 5 mg by mouth every 12 (twelve) hours as needed.       . diltiazem (DILACOR XR) 180 MG 24 hr capsule Take 180 mg by mouth daily.      Marland Kitchen glipiZIDE (GLUCOTROL) 10 MG tablet Take 10 mg by mouth daily.      . hydrochlorothiazide (HYDRODIURIL) 25 MG tablet Take 1 tablet (25 mg total) by mouth daily.  90 tablet  3  . HYDROcodone-acetaminophen (VICODIN) 5-500 MG per tablet Take 1 tablet by mouth every 12 (twelve) hours as needed.        Marland Kitchen ipratropium (ATROVENT) 0.02 % nebulizer solution Take 500 mcg by nebulization as directed.        Marland Kitchen levothyroxine (SYNTHROID, LEVOTHROID) 100 MCG tablet Take 100 mcg by mouth daily.        Marland Kitchen lisinopril (PRINIVIL,ZESTRIL) 40  MG tablet Take 40 mg by mouth daily.      . nitroGLYCERIN (NITROLINGUAL) 0.4 MG/SPRAY spray Place 1 spray under the tongue every 5 (five) minutes as needed.        . potassium chloride SA (K-DUR,KLOR-CON) 20 MEQ tablet Take 20 mEq by mouth daily.        . rosuvastatin (CRESTOR) 10 MG tablet Take 1 tablet (10 mg total) by mouth daily.  30 tablet  6   No current facility-administered medications for this visit.    Past Medical History  Diagnosis Date  . Essential hypertension, benign   . Hyperlipidemia   . Coronary atherosclerosis of native coronary artery     DES RCA 2005, DES circ 2012  . Atrial fibrillation     Paroxysmal - declined anticoagulation  . COPD (chronic obstructive pulmonary disease)   . Type 2 diabetes mellitus   . NSTEMI (non-ST elevated myocardial infarction)   . Carotid artery disease   . Cardiomyopathy     LVEF 35% up to 60%  . Hypothyroidism   . Sinus bradycardia     HR 30-40s with associated lightheadedness/presyncoe. AVN blockers held with improvement.    Social History [name] [name] reports that she  quit smoking about 8 months ago. Her smoking use included Cigarettes. She started smoking about 49 years ago. She has a 22.5 pack-year smoking history. She has never used smokeless tobacco. [name] [name] reports that she does not drink alcohol.  Review of Systems Recent cold and chest congestion, no fevers or chills. Took an outpatient course of antibiotics. Negative except as outlined.   Physical Examination Filed Vitals:   03/04/13 1307  BP: 121/74  Pulse: 56   Filed Weights   03/04/13 1307  Weight: 149 lb (67.586 kg)    Appears comfortable. HEENT: Conjunctiva and lids normal, oropharynx clear.  Neck: Supple, no elevated JVP, soft left carotid bruit, no thyromegaly.  Lungs: Diminished, scattered rhonchi with prolonged expiratory phase,, nonlabored breathing at rest.  Cardiac: Regular rate and rhythm, no S3, soft systolic murmur, no pericardial rub.    Abdomen: Soft, nontender, bowel sounds present.  Extremities: No pitting edema, distal pulses 2+.  Skin: Warm and dry.  Musculoskeletal: No kyphosis.  Neuropsychiatric: Alert and oriented x3, affect grossly appropriate.   Problem List and Plan   CAD, NATIVE VESSEL Symptomatically stable medical therapy following interventions as outlined above. Continue current regimen and observation, followup in 6 months.  CAROTID ARTERY DISEASE Carotid Dopplers from June 2014 reviewed. Can consider repeat study around the time of her next visit.  Essential hypertension, benign Good blood pressure control today.  Paroxysmal atrial fibrillation She continues to decline anticoagulation. Will stay on aspirin and Cardizem CD for now.  HYPERLIPIDEMIA-MIXED She continues on Crestor, lipid followup with Dr. Sherril CroonVyas.    Jonelle SidleSamuel G. Onica Davidovich, M.D., F.A.C.C.

## 2013-09-09 ENCOUNTER — Ambulatory Visit (INDEPENDENT_AMBULATORY_CARE_PROVIDER_SITE_OTHER): Payer: Medicare Other | Admitting: Cardiology

## 2013-09-09 ENCOUNTER — Encounter: Payer: Self-pay | Admitting: Cardiology

## 2013-09-09 VITALS — BP 148/68 | HR 61 | Ht 63.0 in | Wt 148.8 lb

## 2013-09-09 DIAGNOSIS — E785 Hyperlipidemia, unspecified: Secondary | ICD-10-CM

## 2013-09-09 DIAGNOSIS — I480 Paroxysmal atrial fibrillation: Secondary | ICD-10-CM

## 2013-09-09 DIAGNOSIS — I6529 Occlusion and stenosis of unspecified carotid artery: Secondary | ICD-10-CM

## 2013-09-09 DIAGNOSIS — I2510 Atherosclerotic heart disease of native coronary artery without angina pectoris: Secondary | ICD-10-CM

## 2013-09-09 DIAGNOSIS — I4891 Unspecified atrial fibrillation: Secondary | ICD-10-CM

## 2013-09-09 NOTE — Assessment & Plan Note (Signed)
Recent carotid Dopplers noted above.

## 2013-09-09 NOTE — Assessment & Plan Note (Signed)
She continues on Crestor, keep followup with Dr. Sherril CroonVyas.

## 2013-09-09 NOTE — Progress Notes (Signed)
Clinical Summary [name] [name] is a 73 y.o.female last seen in January. She reports no angina symptoms or nitroglycerin use. Has been avoiding the high heat this summer, has not been getting outdoors much.  Followup carotid Dopplers in June 2014 showed 0-39% RICA stenosis and 40-59% LICA stenosis. She had a followup study with Dr. Sherril CroonVyas describing 1-50% bilateral ICA stenoses as of this June.  She will be following up with primary care for a physical and lab work soon.   Allergies  Allergen Reactions  . Codeine     REACTION: stomach upset  . Ciprofloxacin Nausea And Vomiting    Current Outpatient Prescriptions  Medication Sig Dispense Refill  . albuterol (PROAIR HFA) 108 (90 BASE) MCG/ACT inhaler Inhale 2 puffs into the lungs every 6 (six) hours as needed.        Marland Kitchen. albuterol (PROVENTIL) (2.5 MG/3ML) 0.083% nebulizer solution Take 2.5 mg by nebulization every 6 (six) hours as needed.        Marland Kitchen. aspirin 325 MG tablet Take 325 mg by mouth daily.        . Calcium Carbonate-Vit D-Min (CALCIUM 1200) 1200-1000 MG-UNIT CHEW Chew 1 capsule by mouth 2 (two) times daily.        Marland Kitchen. dexlansoprazole (DEXILANT) 60 MG capsule Take 60 mg by mouth daily.        . diazepam (VALIUM) 5 MG tablet Take 5 mg by mouth every 12 (twelve) hours as needed.       . diltiazem (DILACOR XR) 180 MG 24 hr capsule Take 180 mg by mouth daily.      Marland Kitchen. glipiZIDE (GLUCOTROL) 10 MG tablet Take 10 mg by mouth daily.      . hydrochlorothiazide (HYDRODIURIL) 25 MG tablet Take 1 tablet (25 mg total) by mouth daily.  90 tablet  3  . HYDROcodone-acetaminophen (VICODIN) 5-500 MG per tablet Take 1 tablet by mouth every 12 (twelve) hours as needed.        Marland Kitchen. ipratropium (ATROVENT) 0.02 % nebulizer solution Take 500 mcg by nebulization as directed.        Marland Kitchen. levothyroxine (SYNTHROID, LEVOTHROID) 100 MCG tablet Take 100 mcg by mouth daily.        Marland Kitchen. lisinopril (PRINIVIL,ZESTRIL) 40 MG tablet Take 40 mg by mouth daily.      . nitroGLYCERIN  (NITROLINGUAL) 0.4 MG/SPRAY spray Place 1 spray under the tongue every 5 (five) minutes as needed.        . potassium chloride SA (K-DUR,KLOR-CON) 20 MEQ tablet Take 20 mEq by mouth daily.        . rosuvastatin (CRESTOR) 10 MG tablet Take 1 tablet (10 mg total) by mouth daily.  30 tablet  6   No current facility-administered medications for this visit.    Past Medical History  Diagnosis Date  . Essential hypertension, benign   . Hyperlipidemia   . Coronary atherosclerosis of native coronary artery     DES RCA 2005, DES circ 2012  . Atrial fibrillation     Paroxysmal - declined anticoagulation  . COPD (chronic obstructive pulmonary disease)   . Type 2 diabetes mellitus   . NSTEMI (non-ST elevated myocardial infarction)   . Carotid artery disease   . Cardiomyopathy     LVEF 35% up to 60%  . Hypothyroidism   . Sinus bradycardia     HR 30-40s with associated lightheadedness/presyncoe. AVN blockers held with improvement.    Social History [name] [name] reports that she quit smoking about 15  months ago. Her smoking use included Cigarettes. She started smoking about 49 years ago. She has a 22.5 pack-year smoking history. She has never used smokeless tobacco. [name] [name] reports that she does not drink alcohol.  Review of Systems Chronic dyspnea on exertion, no palpitations. Other systems reviewed and negative.  Physical Examination Filed Vitals:   09/09/13 0955  BP: 148/68  Pulse: 61   Filed Weights   09/09/13 0955  Weight: 148 lb 12.8 oz (67.495 kg)    Appears comfortable.  HEENT: Conjunctiva and lids normal, oropharynx clear.  Neck: Supple, no elevated JVP, soft left carotid bruit, no thyromegaly.  Lungs: Diminished, scattered rhonchi with prolonged expiratory phase,, nonlabored breathing at rest.  Cardiac: Regular rate and rhythm, no S3, soft systolic murmur, no pericardial rub.  Abdomen: Soft, nontender, bowel sounds present.  Extremities: No pitting edema, distal pulses 2+.    Skin: Warm and dry.  Musculoskeletal: No kyphosis.  Neuropsychiatric: Alert and oriented x3, affect grossly appropriate.   Problem List and Plan   CAD, NATIVE VESSEL Remains stable on medical therapy, last intervention DES to the circumflex in 2012. Will discuss followup ischemic testing around time of her next visit.  Paroxysmal atrial fibrillation She continues to decline anticoagulation. Will stay on aspirin and Cardizem CD for now.  CAROTID ARTERY DISEASE Recent carotid Dopplers noted above.  HYPERLIPIDEMIA-MIXED She continues on Crestor, keep followup with Dr. Sherril Croon.    Jonelle Sidle, M.D., F.A.C.C.

## 2013-09-09 NOTE — Assessment & Plan Note (Signed)
Remains stable on medical therapy, last intervention DES to the circumflex in 2012. Will discuss followup ischemic testing around time of her next visit.

## 2013-09-09 NOTE — Patient Instructions (Signed)

## 2013-09-09 NOTE — Assessment & Plan Note (Signed)
She continues to decline anticoagulation. Will stay on aspirin and Cardizem CD for now.

## 2014-03-24 ENCOUNTER — Ambulatory Visit: Payer: Medicare Other | Admitting: Cardiology

## 2014-04-21 ENCOUNTER — Ambulatory Visit (INDEPENDENT_AMBULATORY_CARE_PROVIDER_SITE_OTHER): Payer: Medicare Other | Admitting: Cardiology

## 2014-04-21 ENCOUNTER — Encounter: Payer: Self-pay | Admitting: *Deleted

## 2014-04-21 ENCOUNTER — Encounter: Payer: Self-pay | Admitting: Cardiology

## 2014-04-21 VITALS — BP 128/67 | HR 67 | Ht 63.0 in | Wt 145.1 lb

## 2014-04-21 DIAGNOSIS — I10 Essential (primary) hypertension: Secondary | ICD-10-CM

## 2014-04-21 DIAGNOSIS — E782 Mixed hyperlipidemia: Secondary | ICD-10-CM

## 2014-04-21 DIAGNOSIS — I480 Paroxysmal atrial fibrillation: Secondary | ICD-10-CM

## 2014-04-21 DIAGNOSIS — I2510 Atherosclerotic heart disease of native coronary artery without angina pectoris: Secondary | ICD-10-CM

## 2014-04-21 NOTE — Progress Notes (Signed)
Cardiology Office Note  Date: 04/21/2014   ID: Aldea, [name] [name] 17, 1942, MRN [id number]  PCP: Ignatius Specking., MD  Primary Cardiologist: Nona Dell, MD   Chief Complaint  Patient presents with  . Coronary Artery Disease  . Atrial Fibrillation  . Cardiomyopathy    History of Present Illness: [name] [name] is a 74 y.o. female last seen in July 2015. She presents today for a routine follow-up visit. She is not reporting any angina symptoms, but does have occasional palpitations that are usually brief. She has used Lopressor a few times since I last saw her, but does not take the medication regularly. We reviewed her additional medications which are outlined below.  She is due for follow-up ischemic testing based on duration of time from her last coronary intervention in 2012. We discussed this today, but she wanted to hold off any further testing for now.  She states that she continues to follow with Dr. Sherril Croon for lab work, and will be seeing him next month.   Past Medical History  Diagnosis Date  . Essential hypertension, benign   . Hyperlipidemia   . Coronary atherosclerosis of native coronary artery     DES RCA 2005, DES circ 2012  . Atrial fibrillation     Paroxysmal - declined anticoagulation  . COPD (chronic obstructive pulmonary disease)   . Type 2 diabetes mellitus   . NSTEMI (non-ST elevated myocardial infarction)   . Carotid artery disease   . Cardiomyopathy     LVEF 35% up to 60%  . Hypothyroidism   . Sinus bradycardia     HR 30-40s with associated lightheadedness/presyncoe. AVN blockers held with improvement.    Current Outpatient Prescriptions  Medication Sig Dispense Refill  . albuterol (PROAIR HFA) 108 (90 BASE) MCG/ACT inhaler Inhale 2 puffs into the lungs every 6 (six) hours as needed.      Marland Kitchen albuterol (PROVENTIL) (2.5 MG/3ML) 0.083% nebulizer solution Take 2.5 mg by nebulization every 6 (six) hours as needed.      Marland Kitchen alendronate (FOSAMAX) 70  MG tablet Take 70 mg by mouth once a week. Take with a full glass of water on an empty stomach.    Marland Kitchen aspirin 325 MG tablet Take 325 mg by mouth daily.      . Calcium Carbonate-Vit D-Min (CALCIUM 1200) 1200-1000 MG-UNIT CHEW Chew 1 capsule by mouth 2 (two) times daily.      Marland Kitchen dexlansoprazole (DEXILANT) 60 MG capsule Take 60 mg by mouth daily.      . diazepam (VALIUM) 5 MG tablet Take 5 mg by mouth every 12 (twelve) hours as needed.     . diltiazem (DILACOR XR) 180 MG 24 hr capsule Take 180 mg by mouth daily.    Marland Kitchen glipiZIDE (GLUCOTROL) 10 MG tablet Take 10 mg by mouth daily.    . hydrochlorothiazide (HYDRODIURIL) 25 MG tablet Take 1 tablet (25 mg total) by mouth daily. 90 tablet 3  . HYDROcodone-acetaminophen (VICODIN) 5-500 MG per tablet Take 1 tablet by mouth every 12 (twelve) hours as needed.      Marland Kitchen ipratropium (ATROVENT) 0.02 % nebulizer solution Take 500 mcg by nebulization as directed.      Marland Kitchen levothyroxine (SYNTHROID, LEVOTHROID) 100 MCG tablet Take 100 mcg by mouth daily.      Marland Kitchen lisinopril (PRINIVIL,ZESTRIL) 40 MG tablet Take 40 mg by mouth daily.    . metoprolol tartrate (LOPRESSOR) 25 MG tablet Take 25 mg by mouth 2 (two) times daily.    Marland Kitchen  nitroGLYCERIN (NITROLINGUAL) 0.4 MG/SPRAY spray Place 1 spray under the tongue every 5 (five) minutes as needed.      . potassium chloride SA (K-DUR,KLOR-CON) 20 MEQ tablet Take 20 mEq by mouth daily.      . rosuvastatin (CRESTOR) 10 MG tablet Take 1 tablet (10 mg total) by mouth daily. 30 tablet 6   No current facility-administered medications for this visit.    Allergies:  Codeine and Ciprofloxacin   Social History: The patient  reports that she quit smoking about 22 months ago. Her smoking use included Cigarettes. She started smoking about 50 years ago. She has a 22.5 pack-year smoking history. She has never used smokeless tobacco. She reports that she does not drink alcohol or use illicit drugs.   ROS:  Please see the history of present illness.  Otherwise, complete review of systems is positive for chest congestion and intermittent cough.  All other systems are reviewed and negative.    Physical Exam: VS:  BP 128/67 mmHg  Pulse 67  Ht 5\' 3"  (1.6 m)  Wt 145 lb 1.9 oz (65.826 kg)  BMI 25.71 kg/m2  SpO2 94%, BMI Body mass index is 25.71 kg/(m^2).  Wt Readings from Last 3 Encounters:  04/21/14 145 lb 1.9 oz (65.826 kg)  09/09/13 148 lb 12.8 oz (67.495 kg)  03/04/13 149 lb (67.586 kg)     Appears comfortable.  HEENT: Conjunctiva and lids normal, oropharynx clear.  Neck: Supple, no elevated JVP, soft left carotid bruit, no thyromegaly.  Lungs: Diminished, scattered rhonchi with prolonged expiratory phase,, nonlabored breathing at rest.  Cardiac: Regular rate and rhythm, no S3, soft systolic murmur, no pericardial rub.  Abdomen: Soft, nontender, bowel sounds present.  Extremities: No pitting edema, distal pulses 2+.  Skin: Warm and dry.  Musculoskeletal: No kyphosis.  Neuropsychiatric: Alert and oriented x3, affect grossly appropriate.   ECG: ECG is ordered today and reviewed showing sinus rhythm with nonspecific T-wave changes, IVCD, rightward axis.   Other Studies Reviewed Today:  Carotid Dopplers [facility] 08/26/2013: 1-50% bilateral ICA stenoses.  Assessment and Plan:  1. CAD status post DES to the RCA in 2005 and DES to the circumflex in 2012. She denies any angina symptoms. Follow-up ischemic testing discussed, however she prefers to hold off and continue observation for now on medical therapy. Follow-up arranged.  2. Paroxysmal atrial fibrillation, declines anticoagulation. Continue aspirin, calcium channel blocker, as needed metoprolol. She is in sinus rhythm by ECG today.  3. Hyperlipidemia, on Crestor. Keep follow-up with Dr. Sherril CroonVyas.  Current medicines are reviewed at length with the patient today.  The patient has concerns regarding medicines. We discussed her metoprolol, continued as  needed use rather than standing use.   Orders Placed This Encounter  Procedures  . EKG 12-Lead    Disposition: FU with me in 6 months.   Signed, Jonelle SidleSamuel G. Yoel Kaufhold, MD, [facility]villeFACC 04/21/2014 1:32 PM    [facility] [address], [place], [place] [address] Phone: [phone]) [phone]; Fax: [phone]) [phone]

## 2014-04-21 NOTE — Patient Instructions (Signed)
Your physician wants you to follow-up in: 6 months with Dr. Randa SpikeMcDowell You will receive a reminder letter in the mail two months in advance. If you don't receive a letter, please call our office to schedule the follow-up appointment.  Your physician has recommended you make the following change in your medication:   TAKE LOPRESSOR AS NEEDED ONLY FOR PALPITATIONS  WE WILL REQUEST LABS FROM DR. VYAS  Thank you for choosing [facility]!!

## 2014-08-07 ENCOUNTER — Telehealth: Payer: Self-pay | Admitting: Cardiology

## 2014-08-07 ENCOUNTER — Telehealth: Payer: Self-pay | Admitting: *Deleted

## 2014-08-07 ENCOUNTER — Encounter: Payer: Self-pay | Admitting: *Deleted

## 2014-08-07 MED ORDER — NITROGLYCERIN 0.4 MG/SPRAY TL SOLN: 1.0000 | Status: DC | PRN

## 2014-08-07 MED ORDER — NITROGLYCERIN 0.4 MG SL SUBL: 0.4000 mg | SUBLINGUAL_TABLET | SUBLINGUAL | Status: AC | PRN

## 2014-08-07 NOTE — Telephone Encounter (Signed)
[name] [name] calls stating she has been having chest pains since yesterday. States she took a Nitro yesterday but did not help. Tightness in chest. Please call [phone].

## 2014-08-07 NOTE — Telephone Encounter (Signed)
Spoke with patient and she c/o chest pain that started yesterday rated #5 on a scale of 1-10 (10 being the greatest). Patient said she used her nitro spray x's 1 and felt some relief. Patient said she thinks her nitro is old and is requesting a refill. Nurse advised patient that a new prescription for nitro would be sent to her pharmacy. No more chest pain since that time. No c/o sob or dizziness. Patient said she took her BP 129/71 & HR 77. Patient given the first available appointment with provider and informed that if her symptoms get worse and she uses her Nitro x's 3 with no relief, that she needed to proceed to the ED for an evaluation. Patient verbalized understanding of plan.

## 2014-08-07 NOTE — Telephone Encounter (Signed)
Called patient to see why she didn't take the tablets and she said she didn't know but she rather have the tablets. Tablets sent to [facility].

## 2014-08-15 ENCOUNTER — Telehealth: Payer: Self-pay | Admitting: Cardiology

## 2014-08-15 ENCOUNTER — Encounter: Payer: Self-pay | Admitting: Cardiology

## 2014-08-15 ENCOUNTER — Ambulatory Visit (INDEPENDENT_AMBULATORY_CARE_PROVIDER_SITE_OTHER): Payer: Medicare Other | Admitting: Cardiology

## 2014-08-15 ENCOUNTER — Encounter: Payer: Self-pay | Admitting: *Deleted

## 2014-08-15 VITALS — BP 130/86 | HR 80 | Ht 62.0 in | Wt 148.0 lb

## 2014-08-15 DIAGNOSIS — I25118 Atherosclerotic heart disease of native coronary artery with other forms of angina pectoris: Secondary | ICD-10-CM | POA: Diagnosis not present

## 2014-08-15 DIAGNOSIS — I10 Essential (primary) hypertension: Secondary | ICD-10-CM

## 2014-08-15 DIAGNOSIS — R079 Chest pain, unspecified: Secondary | ICD-10-CM | POA: Diagnosis not present

## 2014-08-15 DIAGNOSIS — I480 Paroxysmal atrial fibrillation: Secondary | ICD-10-CM | POA: Diagnosis not present

## 2014-08-15 MED ORDER — APIXABAN 5 MG PO TABS: 5.0000 mg | ORAL_TABLET | Freq: Two times a day (BID) | ORAL | Status: DC

## 2014-08-15 NOTE — Telephone Encounter (Signed)
Lexiscan - chest pain, CAD, AF Schedule at [facility] June 24th arrival time 930

## 2014-08-15 NOTE — Patient Instructions (Addendum)
   Stop Aspirin.  Begin Eliquis 5mg  twice a day - samples, free 30-day trial card, & printed scripts given.  Continue all other medications.   Your physician has requested that you have a lexiscan myoview. For further information please visit [email or web address] Please follow instruction sheet, as given. Office will contact with results via phone or letter.   Follow up after testing above with Dr. Diona Browner.

## 2014-08-15 NOTE — Progress Notes (Signed)
Cardiology Office Note   Date:  08/15/2014   ID:  [name], DOB [birth year], MRN [id number]  PCP:  Ignatius Specking., MD  Cardiologist:   Rollene Rotunda, MD   No chief complaint on file.     History of Present Illness: [name] [name] is a 74 y.o. female who presents for evaluation of chest discomfort. She has a history of coronary disease and paroxysmal atrial fibrillation as described below. She says that for about a week she was having some chest discomfort. She describes this as a dull discomfort. It is in her chest and not radiating to her neck or to her arms. It might be 5 out of 10 in intensity and coming on with exertion. She actually has taken some nitroglycerin. She went to [facility] and was observed overnight. She had negative cardiac enzymes and no objective evidence of ischemia and was discharged. She arrange for follow-up here. Of note today she is in atrial fibrillation but she actually doesn't feel this. She hasn't had any chest discomfort last couple of days but she did take a nitroglycerin for a twice since she was out of the hospital. She was able to be active yesterday without bringing on any symptoms. She says she's not had any symptoms at rest. She's not had any associated nausea vomiting or diaphoresis. She's had no palpitations, presyncope or syncope. Of note there is a mention that she smoking a few cigarettes she says she hasn't done this in a while.  Past Medical History  Diagnosis Date  . Essential hypertension, benign   . Hyperlipidemia   . Coronary atherosclerosis of native coronary artery     DES RCA 2005, DES circ 2012  . Atrial fibrillation     Paroxysmal - declined anticoagulation  . COPD (chronic obstructive pulmonary disease)   . Type 2 diabetes mellitus   . NSTEMI (non-ST elevated myocardial infarction)   . Carotid artery disease   . Cardiomyopathy     LVEF 35% up to 60%  . Hypothyroidism   . Sinus bradycardia     HR 30-40s with associated  lightheadedness/presyncoe. AVN blockers held with improvement.    Past Surgical History  Procedure Laterality Date  . Appendectomy    . Cholecystectomy    . Abdominal hysterectomy    . Excision of melanoma  04/29/2010    Lower left neck     Current Outpatient Prescriptions  Medication Sig Dispense Refill  . albuterol (PROAIR HFA) 108 (90 BASE) MCG/ACT inhaler Inhale 2 puffs into the lungs every 6 (six) hours as needed.      Marland Kitchen albuterol (PROVENTIL) (2.5 MG/3ML) 0.083% nebulizer solution Take 2.5 mg by nebulization every 6 (six) hours as needed.      Marland Kitchen alendronate (FOSAMAX) 70 MG tablet Take 70 mg by mouth once a week. Take with a full glass of water on an empty stomach.    Marland Kitchen aspirin 325 MG tablet Take 325 mg by mouth daily.      . Calcium Carbonate-Vit D-Min (CALCIUM 1200) 1200-1000 MG-UNIT CHEW Chew 1 capsule by mouth 2 (two) times daily.      Marland Kitchen dexlansoprazole (DEXILANT) 60 MG capsule Take 60 mg by mouth daily.      . diazepam (VALIUM) 5 MG tablet Take 5 mg by mouth every 12 (twelve) hours as needed.     . diltiazem (DILACOR XR) 180 MG 24 hr capsule Take 180 mg by mouth daily.    Marland Kitchen glipiZIDE (GLUCOTROL) 10 MG tablet  Take 10 mg by mouth daily.    . hydrochlorothiazide (HYDRODIURIL) 25 MG tablet Take 1 tablet (25 mg total) by mouth daily. 90 tablet 3  . HYDROcodone-acetaminophen (VICODIN) 5-500 MG per tablet Take 1 tablet by mouth every 12 (twelve) hours as needed.      Marland Kitchen ipratropium (ATROVENT) 0.02 % nebulizer solution Take 500 mcg by nebulization as directed.      Marland Kitchen levothyroxine (SYNTHROID, LEVOTHROID) 88 MCG tablet Take 88 mcg by mouth daily before breakfast.    . lisinopril (PRINIVIL,ZESTRIL) 40 MG tablet Take 40 mg by mouth daily.    . metoprolol tartrate (LOPRESSOR) 25 MG tablet Take 25 mg by mouth 2 (two) times daily.    . nitroGLYCERIN (NITROSTAT) 0.4 MG SL tablet Place 1 tablet (0.4 mg total) under the tongue every 5 (five) minutes x 3 doses as needed for chest pain. For severe  chest pain 25 tablet 3  . potassium chloride SA (K-DUR,KLOR-CON) 20 MEQ tablet Take 20 mEq by mouth daily.      . rosuvastatin (CRESTOR) 10 MG tablet Take 1 tablet (10 mg total) by mouth daily. 30 tablet 6   No current facility-administered medications for this visit.    Allergies:   Codeine and Ciprofloxacin    ROS:  Please see the history of present illness.   Otherwise, review of systems are positive for none.   All other systems are reviewed and negative.    PHYSICAL EXAM: VS:  BP 130/86 mmHg  Pulse 80  Ht 5\' 2"  (1.575 m)  Wt 148 lb (67.132 kg)  BMI 27.06 kg/m2  SpO2 99% , BMI Body mass index is 27.06 kg/(m^2). GENERAL:  Well appearing HEENT:  Pupils equal round and reactive, fundi not visualized, oral mucosa unremarkable NECK:  No jugular venous distention, waveform within normal limits, carotid upstroke brisk and symmetric, no bruits, no thyromegaly LYMPHATICS:  No cervical, inguinal adenopathy LUNGS:  Clear to auscultation bilaterally, decreased breath sounds BACK:  No CVA tenderness CHEST:  Unremarkable HEART:  PMI not displaced or sustained,S1 and S2 within normal limits, no S3, no clicks, no rubs, no murmurs, irregular, distant heart sounds ABD:  Flat, positive bowel sounds normal in frequency in pitch, no bruits, no rebound, no guarding, no midline pulsatile mass, no hepatomegaly, no splenomegaly EXT:  2 plus pulses upper and decreased DP/PT, no edema, no cyanosis no clubbing SKIN:  No rashes no nodules NEURO:  Cranial nerves II through XII grossly intact, motor grossly intact throughout PSYCH:  Cognitively intact, oriented to person place and time    EKG:  EKG is ordered today. The ekg ordered today demonstrates atrial fibrillation, rate 111, axis within normal limits, intervals within normal limits, nonspecific T-wave changes, poor anterior R wave progression.   Recent Labs: No results found for requested labs within last 365 days.    Lipid Panel     Wt  Readings from Last 3 Encounters:  08/15/14 148 lb (67.132 kg)  04/21/14 145 lb 1.9 oz (65.826 kg)  09/09/13 148 lb 12.8 oz (67.495 kg)      Other studies Reviewed: Additional studies/ records that were reviewed today include: [facility] records. Review of the above records demonstrates:  Please see elsewhere in the note.     ASSESSMENT AND PLAN:  CHEST PAIN:  She is having some exertional chest discomfort with her known coronary disease. This is not happening at rest. I think she can be risk stratified with a stress perfusion study. She would not be a  walk on a treadmill. Therefore, she will have a [employer].  ATRIAL FIB:  She is not feeling this. [name] [name] has a CHA2DS2 - VASc score of 5 with a risk of stroke of 6.7.  She does not have any active bleeding issues or contraindications to anticoagulation. We talked about this at great length. She will stop her aspirin. And she will start Eliquis. Her recent renal function was normal. Recent CBC was normal. She can have follow-up labs at her next visit. She could also be considered for a Holter or event monitor to make sure she has reasonable rate control.  She knows she needs to stop smoking completely.TOBACCO:     Current medicines are reviewed at length with the patient today.  The patient does not have concerns regarding medicines.  The following changes have been made:  See above  Labs/ tests ordered today include:  Orders Placed This Encounter  Procedures  . EKG 12-Lead     Disposition:   FU with Dr. Diona Browner after the [facility].    Signed, Rollene Rotunda, MD  08/15/2014 10:11 AM    [facility]

## 2014-08-19 NOTE — Telephone Encounter (Signed)
No precert required 

## 2014-08-22 ENCOUNTER — Encounter (HOSPITAL_COMMUNITY)
Admission: RE | Admit: 2014-08-22 | Discharge: 2014-08-22 | Disposition: A | Discharge status: Home / Self Care | Payer: Medicare Other | Source: Ambulatory Visit | Attending: Cardiology | Admitting: Cardiology

## 2014-08-22 ENCOUNTER — Inpatient Hospital Stay (HOSPITAL_COMMUNITY): Admission: RE | Admit: 2014-08-22 | Payer: Medicare Other | Source: Ambulatory Visit

## 2014-08-22 ENCOUNTER — Encounter (HOSPITAL_COMMUNITY): Payer: Self-pay

## 2014-08-22 DIAGNOSIS — I25118 Atherosclerotic heart disease of native coronary artery with other forms of angina pectoris: Secondary | ICD-10-CM | POA: Diagnosis present

## 2014-08-22 DIAGNOSIS — R079 Chest pain, unspecified: Secondary | ICD-10-CM

## 2014-08-22 DIAGNOSIS — I480 Paroxysmal atrial fibrillation: Secondary | ICD-10-CM | POA: Diagnosis not present

## 2014-08-22 MED ORDER — TECHNETIUM TC 99M SESTAMIBI - CARDIOLITE
30.0000 | Freq: Once | INTRAVENOUS | Status: AC | PRN
  Administered 2014-08-22: 12:00:00 30 via INTRAVENOUS

## 2014-08-22 MED ORDER — REGADENOSON 0.4 MG/5ML IV SOLN
INTRAVENOUS | Status: AC
  Administered 2014-08-22: 0.4 mg via INTRAVENOUS
  Filled 2014-08-22: qty 5

## 2014-08-22 MED ORDER — SODIUM CHLORIDE 0.9 % IJ SOLN
INTRAMUSCULAR | Status: AC
  Administered 2014-08-22: 10 mL via INTRAVENOUS
  Filled 2014-08-22: qty 3

## 2014-08-22 MED ORDER — TECHNETIUM TC 99M SESTAMIBI GENERIC - CARDIOLITE
10.0000 | Freq: Once | INTRAVENOUS | Status: AC | PRN
  Administered 2014-08-22: 10 via INTRAVENOUS

## 2014-08-26 LAB — NM MYOCAR MULTI W/SPECT W/WALL MOTION / EF
CHL CUP NUCLEAR SDS: 3
CHL CUP NUCLEAR SRS: 4
CHL CUP NUCLEAR SSS: 7
LV sys vol: 14 mL
LVDIAVOL: 24 mL
NUC STRESS TID: 1.17
Peak HR: 134 {beats}/min
Rest HR: 100 {beats}/min

## 2014-08-29 ENCOUNTER — Telehealth: Payer: Self-pay | Admitting: Cardiology

## 2014-08-29 NOTE — Telephone Encounter (Signed)
Mrs [name] [name] is calling to get results of testing done.  She also states that she is very short of breath with swelling in her feet.

## 2014-09-05 ENCOUNTER — Telehealth: Payer: Self-pay | Admitting: *Deleted

## 2014-09-05 DIAGNOSIS — R931 Abnormal findings on diagnostic imaging of heart and coronary circulation: Secondary | ICD-10-CM

## 2014-09-05 NOTE — Telephone Encounter (Signed)
[facility] office addressed results.

## 2014-09-05 NOTE — Telephone Encounter (Signed)
Nuclear stress test results called to patient.  Voiced understanding. Explained Dr. Antoine PocheHochrein wants her to have an echo to evaluate her EF.  She would like it done at our office. Order placed.  Scheduling will call with appt date and time.

## 2014-09-05 NOTE — Telephone Encounter (Signed)
-----   Message from Rollene RotundaJames Hochrein, MD sent at 08/27/2014  5:23 PM EDT ----- She has no areas of ischemia.  She does appear to have a lower EF which might be underestimated.  I would like to get an echo.  However, no further ischemia work up is indicated. Call [name] with the results and send results to VYAS,DHRUV B., MD

## 2014-09-09 ENCOUNTER — Inpatient Hospital Stay (HOSPITAL_COMMUNITY): Admission: RE | Admit: 2014-09-09 | Payer: Medicare Other | Source: Ambulatory Visit

## 2014-09-09 ENCOUNTER — Ambulatory Visit: Payer: Medicare Other | Admitting: Cardiology

## 2014-09-30 ENCOUNTER — Other Ambulatory Visit: Payer: Self-pay | Admitting: Cardiology

## 2014-10-02 ENCOUNTER — Other Ambulatory Visit: Payer: Self-pay

## 2014-10-02 ENCOUNTER — Ambulatory Visit (INDEPENDENT_AMBULATORY_CARE_PROVIDER_SITE_OTHER): Payer: Medicare Other

## 2014-10-02 DIAGNOSIS — I10 Essential (primary) hypertension: Secondary | ICD-10-CM | POA: Diagnosis not present

## 2014-10-02 DIAGNOSIS — R931 Abnormal findings on diagnostic imaging of heart and coronary circulation: Secondary | ICD-10-CM | POA: Diagnosis not present

## 2014-10-09 ENCOUNTER — Telehealth: Payer: Self-pay | Admitting: Cardiology

## 2014-10-09 ENCOUNTER — Ambulatory Visit: Payer: Medicare Other | Admitting: Cardiology

## 2014-10-09 NOTE — Telephone Encounter (Signed)
Wants to know Xray results

## 2014-10-09 NOTE — Telephone Encounter (Signed)
Pt wanted echo results, doesn't want to travel to [place] or [facility] to see Dr Antoine Poche, would like to see Dr Diona Browner in [place], scheduled for 8/31, and gave pt echo results.

## 2014-10-29 ENCOUNTER — Telehealth: Payer: Self-pay | Admitting: *Deleted

## 2014-10-29 ENCOUNTER — Encounter: Payer: Self-pay | Admitting: Cardiology

## 2014-10-29 ENCOUNTER — Ambulatory Visit (INDEPENDENT_AMBULATORY_CARE_PROVIDER_SITE_OTHER): Payer: Medicare Other | Admitting: Cardiology

## 2014-10-29 VITALS — BP 118/70 | HR 90 | Ht 62.0 in | Wt 143.0 lb

## 2014-10-29 DIAGNOSIS — I10 Essential (primary) hypertension: Secondary | ICD-10-CM

## 2014-10-29 DIAGNOSIS — I480 Paroxysmal atrial fibrillation: Secondary | ICD-10-CM

## 2014-10-29 DIAGNOSIS — I2510 Atherosclerotic heart disease of native coronary artery without angina pectoris: Secondary | ICD-10-CM

## 2014-10-29 DIAGNOSIS — E782 Mixed hyperlipidemia: Secondary | ICD-10-CM

## 2014-10-29 DIAGNOSIS — I779 Disorder of arteries and arterioles, unspecified: Secondary | ICD-10-CM

## 2014-10-29 DIAGNOSIS — I739 Peripheral vascular disease, unspecified: Secondary | ICD-10-CM

## 2014-10-29 NOTE — Telephone Encounter (Signed)
Patient was confused about her instructions today. Awaiting call back to advise patient that she need carotid dopplers now and the lab work in 6 months.

## 2014-10-29 NOTE — Patient Instructions (Signed)
Your physician recommends that you continue on your current medications as directed. Please refer to the Current Medication list given to you today. Your physician recommends that you have lab work in 6 months just before your next visit to check your BMET and CBC. We will contact you about having this done when its due. Your physician has requested that you have a carotid duplex. This test is an ultrasound of the carotid arteries in your neck. It looks at blood flow through these arteries that supply the brain with blood. Allow one hour for this exam. There are no restrictions or special instructions. Your physician recommends that you schedule a follow-up appointment in: 6 months. You will receive a reminder letter in the mail in about 4 months reminding you to call and schedule your appointment. If you don't receive this letter, please contact our office.

## 2014-10-29 NOTE — Progress Notes (Signed)
Cardiology Office Note  Date: 10/29/2014   ID: [name] [name], DOB [birth year], MRN [id number]  PCP: Ignatius Specking., MD  Primary Cardiologist: Nona Dell, MD   Chief Complaint  Patient presents with  . Coronary Artery Disease  . Cardiomyopathy  . PAF    History of Present Illness: [name] [name] is a 74 y.o. female last seen by me in February. Record review finds evaluation by Dr. Antoine Poche in June for evaluation of chest pain. She was referred for follow-up ischemic testing and ultimately an echocardiogram.  In addition I see that she had further discussion about anticoagulation for stroke prophylaxis with atrial fibrillation, CHADSVASC score of 5. We have had these discussions previously and she has declined anticoagulation. Dr. Antoine Poche ultimately convinced her to switch from aspirin to Eliquis.  She comes in today for a routine follow-up visit. We discussed the results of her Cardiolite which showed soft tissue attenuation without ischemia, LVEF was quantified at 44%, however appeared to be normal visually. Echocardiography confirmed normal LVEF of 65-70%.  She denies any further chest pain, has had no palpitations. She tells that she has been taking Eliquis without obvious bleeding problems.   Past Medical History  Diagnosis Date  . Essential hypertension, benign   . Hyperlipidemia   . Coronary atherosclerosis of native coronary artery     DES RCA 2005, DES circ 2012  . Atrial fibrillation     Paroxysmal - declined anticoagulation  . COPD (chronic obstructive pulmonary disease)   . Type 2 diabetes mellitus   . Carotid artery disease   . Cardiomyopathy     LVEF 35% up to 60%  . Hypothyroidism   . Sinus bradycardia     HR 30-40s with associated lightheadedness/presyncoe. AVN blockers held with improvement.    Past Surgical History  Procedure Laterality Date  . Appendectomy    . Cholecystectomy    . Abdominal hysterectomy    . Excision of melanoma  04/29/2010    Lower left neck    Current Outpatient Prescriptions  Medication Sig Dispense Refill  . albuterol (PROAIR HFA) 108 (90 BASE) MCG/ACT inhaler Inhale 2 puffs into the lungs every 6 (six) hours as needed.      Marland Kitchen albuterol (PROVENTIL) (2.5 MG/3ML) 0.083% nebulizer solution Take 2.5 mg by nebulization every 6 (six) hours as needed.      Marland Kitchen alendronate (FOSAMAX) 70 MG tablet Take 70 mg by mouth once a week. Take with a full glass of water on an empty stomach.    . Calcium Carbonate-Vit D-Min (CALCIUM 1200) 1200-1000 MG-UNIT CHEW Chew 1 capsule by mouth 2 (two) times daily.      Marland Kitchen dexlansoprazole (DEXILANT) 60 MG capsule Take 60 mg by mouth daily.      . diazepam (VALIUM) 5 MG tablet Take 5 mg by mouth every 12 (twelve) hours as needed.     . diltiazem (DILACOR XR) 180 MG 24 hr capsule Take 180 mg by mouth daily.    Marland Kitchen ELIQUIS 5 MG TABS tablet TAKE 1 TABLET BY MOUTH TWICE DAILY 60 tablet 5  . glipiZIDE (GLUCOTROL) 10 MG tablet Take 10 mg by mouth daily.    . hydrochlorothiazide (HYDRODIURIL) 25 MG tablet Take 1 tablet (25 mg total) by mouth daily. 90 tablet 3  . HYDROcodone-acetaminophen (VICODIN) 5-500 MG per tablet Take 1 tablet by mouth every 12 (twelve) hours as needed.      Marland Kitchen ipratropium (ATROVENT) 0.02 % nebulizer solution Take 500 mcg by nebulization as  directed.      Marland Kitchen levothyroxine (SYNTHROID, LEVOTHROID) 88 MCG tablet Take 88 mcg by mouth daily before breakfast.    . lisinopril (PRINIVIL,ZESTRIL) 40 MG tablet Take 40 mg by mouth daily.    . metoprolol tartrate (LOPRESSOR) 25 MG tablet Take 25 mg by mouth 2 (two) times daily.    . nitroGLYCERIN (NITROSTAT) 0.4 MG SL tablet Place 1 tablet (0.4 mg total) under the tongue every 5 (five) minutes x 3 doses as needed for chest pain. For severe chest pain 25 tablet 3  . potassium chloride SA (K-DUR,KLOR-CON) 20 MEQ tablet Take 20 mEq by mouth daily.      . rosuvastatin (CRESTOR) 10 MG tablet Take 1 tablet (10 mg total) by mouth daily. 30 tablet 6    No current facility-administered medications for this visit.    Allergies:  Codeine and Ciprofloxacin   Social History: The patient  reports that she quit smoking about 2 years ago. Her smoking use included Cigarettes. She started smoking about 50 years ago. She has a 22.5 pack-year smoking history. She has never used smokeless tobacco. She reports that she does not drink alcohol or use illicit drugs.   ROS:  Please see the history of present illness. Otherwise, complete review of systems is positive for none.  All other systems are reviewed and negative.   Physical Exam: VS:  BP 118/70 mmHg  Pulse 90  Ht 5\' 2"  (1.575 m)  Wt 143 lb (64.864 kg)  BMI 26.15 kg/m2  SpO2 96%, BMI Body mass index is 26.15 kg/(m^2).  Wt Readings from Last 3 Encounters:  10/29/14 143 lb (64.864 kg)  08/15/14 148 lb (67.132 kg)  04/21/14 145 lb 1.9 oz (65.826 kg)    Appears comfortable.  HEENT: Conjunctiva and lids normal, oropharynx clear.  Neck: Supple, no elevated JVP, soft left carotid bruit, no thyromegaly.  Lungs: Diminished, scattered rhonchi with prolonged expiratory phase,, nonlabored breathing at rest.  Cardiac: Regular rate and rhythm, no S3, soft systolic murmur, no pericardial rub.  Abdomen: Soft, nontender, bowel sounds present.  Extremities: No pitting edema, distal pulses 2+.  Skin: Warm and dry.  Musculoskeletal: No kyphosis.  Neuropsychiatric: Alert and oriented x3, affect grossly appropriate.   ECG: ECG is not ordered today.  Other Studies Reviewed Today:  Echocardiogram 10/05/2014: Study Conclusions  - Left ventricle: The cavity size was normal. Wall thickness was normal. Systolic function was vigorous. The estimated ejection fraction was in the range of 65% to 70%. Wall motion was normal; there were no regional wall motion abnormalities. The study was not technically sufficient to allow evaluation of LV diastolic dysfunction due to atrial fibrillation. -  Aortic valve: Mildly calcified annulus. Mildly thickened leaflets. Valve area (VTI): 1.87 cm^2. Valve area (Vmax): 1.76 cm^2. Valve area (Vmean): 2.17 cm^2. - Mitral valve: Mildly calcified annulus. Mildly thickened leaflets . - Left atrium: The atrium was moderately dilated. - Right atrium: The atrium was mildly dilated. - Technically difficult study.  Lexiscan Cardiolite 08/22/2014:  Atrial fibrillation present at baseline. There was no ST segment deviation noted during stress.  There is a fixed defect present in the mid inferoseptal location.  Nuclear stress EF: 44%.  Soft tissue attenuation without clear evidence of scar or ischemia. Intermediate risk study based on LVEF 44%, although this looks to be underestimated. Suggest echocardiogram to further clarify.  Assessment and Plan:  1. Symptomatically stable CAD status post DES to the RCA in 2005 with subsequent DES to the circumflex in 2012.  Recent Cardiolite showed no active ischemia and LVEF is normal by echocardiography at 65-70%. We will continue observation on medical therapy for now.  2. Paroxysmal atrial fibrillation with CHADSVASC score of 5. Patient is now on Eliquis and has stopped aspirin. She reports no bleeding problems. We will follow-up with BMET and CBC for her next visit.  3. Hyperlipidemia, on Crestor. She continues to follow with Dr. Sherril Croon.  4. Essential hypertension, blood pressure is normal today.  5. History of carotid artery disease, last assessed 2014. Follow-up study will be arranged.  Current medicines were reviewed with the patient today.  Disposition: FU with me in 6 months.   Signed, Jonelle Sidle, MD, [facility] 10/29/2014 3:08 PM    [facility] at [facility] [address] [place], [place], [place] [address] Phone: [phone]; Fax: [phone]

## 2014-10-30 NOTE — Telephone Encounter (Signed)
Patient advised that her carotid doppler is due now.

## 2014-11-12 ENCOUNTER — Ambulatory Visit (INDEPENDENT_AMBULATORY_CARE_PROVIDER_SITE_OTHER): Payer: Medicare Other

## 2014-11-12 DIAGNOSIS — I779 Disorder of arteries and arterioles, unspecified: Secondary | ICD-10-CM | POA: Diagnosis not present

## 2014-11-12 DIAGNOSIS — I739 Peripheral vascular disease, unspecified: Principal | ICD-10-CM

## 2014-11-20 ENCOUNTER — Telehealth: Payer: Self-pay | Admitting: *Deleted

## 2014-11-20 NOTE — Telephone Encounter (Signed)
Patient informed. 

## 2014-11-20 NOTE — Telephone Encounter (Signed)
-----   Message from Jonelle Sidle, MD sent at 11/13/2014  8:34 AM EDT ----- Reviewed report. 1-39% stable bilateral ICA stenoses.

## 2015-01-21 ENCOUNTER — Telehealth: Payer: Self-pay | Admitting: Cardiology

## 2015-01-21 ENCOUNTER — Ambulatory Visit (INDEPENDENT_AMBULATORY_CARE_PROVIDER_SITE_OTHER): Payer: Medicare Other | Admitting: Internal Medicine

## 2015-01-21 NOTE — Telephone Encounter (Signed)
Spoke with patient and advised her that the only blood thinner listed in her medication profile is eliquis. Patient said that aspirin 325 mg was listed on her paperwork at home and she wanted to make sure that we didn't have her taking too many blood thinners. Patient advised once again that she should only be taking eliquis 5 mg twice daily and not any aspirin. Patient verbalized understanding of plan.

## 2015-01-21 NOTE — Telephone Encounter (Signed)
Mrs. [name] [name] is calling wanting to know why she is so many blood thinners.

## 2015-02-25 ENCOUNTER — Telehealth: Payer: Self-pay | Admitting: Cardiology

## 2015-02-25 DIAGNOSIS — I10 Essential (primary) hypertension: Secondary | ICD-10-CM

## 2015-02-25 NOTE — Telephone Encounter (Signed)
Dizziness in and of itself is not specifically a side effect of Eliquis typically. She may want to check with her PCP to make sure that there is not another cause. Bleeding is something that she should look out for however. It sounds like she had a fairly minor episode involving her finger. It should be getting close to time for a follow-up 6 month visit. Make sure that we have CBC and BMET for that visit.

## 2015-02-25 NOTE — Telephone Encounter (Signed)
Pt c/o some dizziness when walking around home "feeling off balance" and noticed a place on finger that started bleeding this morning but has since stopped. Denies CP/SOB. Pt wants to know if Eliquis could be causing this. Will forward to Dr. Diona BrownerMcDowell

## 2015-02-25 NOTE — Telephone Encounter (Signed)
Pt says she has f/u appt with Dr. Sherril CroonVyas end of January for dizziness and 6 month f/u with Dr. Diona BrownerMcDowell 04/10/15. Will mail pt lab orders for 04/10/15 appt. Pt will call us back with any bleeding problems.

## 2015-02-25 NOTE — Telephone Encounter (Signed)
Mrs. [name] [name] called today stating that she is concerned about being on eliquis 5 mg. States that she started getting dizzy approximately 3 days ago. States that she has started having cramps in her fingers.

## 2015-03-09 ENCOUNTER — Telehealth: Payer: Self-pay | Admitting: *Deleted

## 2015-03-09 NOTE — Telephone Encounter (Signed)
-----   Message from Jonelle SidleSamuel G McDowell, MD sent at 03/06/2015 10:08 AM EST ----- Reviewed. Please let her know that hemoglobin is normal. Also normal platelet count, renal function, and potassium.

## 2015-03-09 NOTE — Telephone Encounter (Signed)
Notes Recorded by Lesle ChrisAngela G Hill, LPN on 1/6/10961/10/2015 at 3:23 PM Patient notified. Copy to pmd.  Patient also had cont'd c/o dizziness.  Stated BP & sugar readings have been good.  Advised her per previous telephone note that she should follow up with PMD to rule out non cardiac causes of her dizziness.  She verbalized understanding.

## 2015-04-10 ENCOUNTER — Encounter: Payer: Self-pay | Admitting: Cardiology

## 2015-04-10 ENCOUNTER — Ambulatory Visit (INDEPENDENT_AMBULATORY_CARE_PROVIDER_SITE_OTHER): Payer: Medicare Other | Admitting: Cardiology

## 2015-04-10 VITALS — BP 125/73 | HR 92 | Ht 62.0 in | Wt 147.8 lb

## 2015-04-10 DIAGNOSIS — I779 Disorder of arteries and arterioles, unspecified: Secondary | ICD-10-CM

## 2015-04-10 DIAGNOSIS — I739 Peripheral vascular disease, unspecified: Secondary | ICD-10-CM

## 2015-04-10 DIAGNOSIS — I480 Paroxysmal atrial fibrillation: Secondary | ICD-10-CM

## 2015-04-10 DIAGNOSIS — I2510 Atherosclerotic heart disease of native coronary artery without angina pectoris: Secondary | ICD-10-CM

## 2015-04-10 DIAGNOSIS — I10 Essential (primary) hypertension: Secondary | ICD-10-CM | POA: Diagnosis not present

## 2015-04-10 DIAGNOSIS — E782 Mixed hyperlipidemia: Secondary | ICD-10-CM | POA: Diagnosis not present

## 2015-04-10 DIAGNOSIS — Z8679 Personal history of other diseases of the circulatory system: Secondary | ICD-10-CM

## 2015-04-10 DIAGNOSIS — R42 Dizziness and giddiness: Secondary | ICD-10-CM

## 2015-04-10 NOTE — Progress Notes (Signed)
Cardiology Office Note  Date: 04/10/2015   ID: TYKERRIA MCCUBBINS, DOB 16-Nov-1940, MRN [id number]  PCP: Ignatius Specking., MD  Primary Cardiologist: Nona Dell, MD   Chief Complaint  Patient presents with  . Coronary Artery Disease  . PAF  . History of cardiomyopathy    History of Present Illness: [name] [name] is a 75 y.o. female last seen in August 2016. She presents for a routine follow-up visit. She tells me that she was admitted to [facility] recently with pneumonia, was managed by Dr. Sherril Croon. She states that she feels better, has some chest congestion, and is now using oxygen as needed.  From a cardiac perspective she does not report any significant palpitations or chest pain with activity. She has mild ankle edema. I reviewed her recent cardiac testing including echocardiogram and Cardiolite results as outlined below. She has normal LVEF and no active ischemic zones based on testing. Continue medical therapy.  We reviewed her medications today which are outlined below. She was somewhat confused as to whether she was to be taking metoprolol once a day or twice a day. I had nursing call her pharmacy to verify that she is on Lopressor which would be 25 mg twice daily. Otherwise she continues with Eliquis and reports no bleeding episodes. I did go over her lab work which is reviewed below. She is also on Dilacor XR 180 mg daily, lisinopril , HCTZ, Crestor, and KCl. Blood pressure is well controlled today.  Weight is relatively stable. She has not had any orthopnea or PND.  She does state that she has had some lightheadedness, mainly noted a few weeks ago, but that this is now resolved. She has had no syncope.  Past Medical History  Diagnosis Date  . Essential hypertension, benign   . Hyperlipidemia   . Coronary atherosclerosis of native coronary artery     DES RCA 2005, DES circ 2012  . PAF (paroxysmal atrial fibrillation) (HCC)     Eliquis started July 2016  . COPD  (chronic obstructive pulmonary disease) (HCC)   . Type 2 diabetes mellitus (HCC)   . Carotid artery disease (HCC)   . Cardiomyopathy (HCC)     LVEF 35% up to 60%  . Hypothyroidism   . Sinus bradycardia     HR 30-40s with associated lightheadedness/presyncoe. AVN blockers held with improvement.    Past Surgical History  Procedure Laterality Date  . Appendectomy    . Cholecystectomy    . Abdominal hysterectomy    . Excision of melanoma  04/29/2010    Lower left neck    Current Outpatient Prescriptions  Medication Sig Dispense Refill  . albuterol (PROAIR HFA) 108 (90 BASE) MCG/ACT inhaler Inhale 2 puffs into the lungs every 6 (six) hours as needed.      Marland Kitchen albuterol (PROVENTIL) (2.5 MG/3ML) 0.083% nebulizer solution Take 2.5 mg by nebulization every 6 (six) hours as needed.      Marland Kitchen alendronate (FOSAMAX) 70 MG tablet Take 70 mg by mouth once a week. Take with a full glass of water on an empty stomach.    . Calcium Carbonate-Vit D-Min (CALCIUM 1200) 1200-1000 MG-UNIT CHEW Chew 1 capsule by mouth 2 (two) times daily.      Marland Kitchen dexlansoprazole (DEXILANT) 60 MG capsule Take 60 mg by mouth daily.      . diazepam (VALIUM) 5 MG tablet Take 5 mg by mouth every 12 (twelve) hours as needed.     . diltiazem (DILACOR XR) 180  MG 24 hr capsule Take 180 mg by mouth daily.    Marland Kitchen ELIQUIS 5 MG TABS tablet TAKE 1 TABLET BY MOUTH TWICE DAILY 60 tablet 5  . Fluticasone Furoate-Vilanterol (BREO ELLIPTA) 100-25 MCG/INH AEPB Inhale 1 puff into the lungs daily.    Marland Kitchen glipiZIDE (GLUCOTROL) 10 MG tablet Take 10 mg by mouth daily.    . hydrochlorothiazide (HYDRODIURIL) 25 MG tablet Take 1 tablet (25 mg total) by mouth daily. 90 tablet 3  . HYDROcodone-acetaminophen (VICODIN) 5-500 MG per tablet Take 1 tablet by mouth every 12 (twelve) hours as needed.      Marland Kitchen ipratropium (ATROVENT) 0.02 % nebulizer solution Take 500 mcg by nebulization as directed.      Marland Kitchen levothyroxine (SYNTHROID, LEVOTHROID) 88 MCG tablet Take 88 mcg by  mouth daily before breakfast.    . lisinopril (PRINIVIL,ZESTRIL) 40 MG tablet Take 40 mg by mouth daily.    . metoprolol tartrate (LOPRESSOR) 25 MG tablet Take 25 mg by mouth 2 (two) times daily.    . nitroGLYCERIN (NITROSTAT) 0.4 MG SL tablet Place 1 tablet (0.4 mg total) under the tongue every 5 (five) minutes x 3 doses as needed for chest pain. For severe chest pain 25 tablet 3  . potassium chloride SA (K-DUR,KLOR-CON) 20 MEQ tablet Take 20 mEq by mouth daily.      . rosuvastatin (CRESTOR) 10 MG tablet Take 1 tablet (10 mg total) by mouth daily. 30 tablet 6   No current facility-administered medications for this visit.   Allergies:  Codeine and Ciprofloxacin   Social History: The patient  reports that she quit smoking about 2 years ago. Her smoking use included Cigarettes. She started smoking about 51 years ago. She has a 22.5 pack-year smoking history. She has never used smokeless tobacco. She reports that she does not drink alcohol or use illicit drugs.   ROS:  Please see the history of present illness. Otherwise, complete review of systems is positive for chest congestion, no fevers or chills, uses oxygen intermittently, occasional mild ankle edema.  All other systems are reviewed and negative.   Physical Exam: VS:  BP 125/73 mmHg  Pulse 92  Ht 5\' 2"  (1.575 m)  Wt 147 lb 12.8 oz (67.042 kg)  BMI 27.03 kg/m2  SpO2 96%, BMI Body mass index is 27.03 kg/(m^2).  Wt Readings from Last 3 Encounters:  04/10/15 147 lb 12.8 oz (67.042 kg)  10/29/14 143 lb (64.864 kg)  08/15/14 148 lb (67.132 kg)    Appears comfortable.  HEENT: Conjunctiva and lids normal, oropharynx clear.  Neck: Supple, no elevated JVP, soft left carotid bruit, no thyromegaly.  Lungs: Diminished, scattered rhonchi with prolonged expiratory phase,, nonlabored breathing at rest.  Cardiac: Irregularly irregular, no S3, soft systolic murmur, no pericardial rub.  Abdomen: Soft, nontender, bowel sounds present.   Extremities: No pitting edema, distal pulses 2+.  Skin: Warm and dry.  Musculoskeletal: No kyphosis.  Neuropsychiatric: Alert and oriented x3, affect grossly appropriate.  ECG:  I personally reviewed the prior tracing from 76/17/2016 which showed rate-controlled atrial fibrillation with IVCD and nonspecific ST changes.  Recent Labwork:  January 2017: Hemoglobin 14.0 , platelets 243, BUN 12, creatinine 0.7, potassium 3.6  Other Studies Reviewed Today:  Echocardiogram 10/05/2014: Study Conclusions  - Left ventricle: The cavity size was normal. Wall thickness was normal. Systolic function was vigorous. The estimated ejection fraction was in the range of 65% to 70%. Wall motion was normal; there were no regional wall motion abnormalities. The  study was not technically sufficient to allow evaluation of LV diastolic dysfunction due to atrial fibrillation. - Aortic valve: Mildly calcified annulus. Mildly thickened leaflets. Valve area (VTI): 1.87 cm^2. Valve area (Vmax): 1.76 cm^2. Valve area (Vmean): 2.17 cm^2. - Mitral valve: Mildly calcified annulus. Mildly thickened leaflets . - Left atrium: The atrium was moderately dilated. - Right atrium: The atrium was mildly dilated. - Technically difficult study.  Lexiscan Cardiolite 08/22/2014:  Atrial fibrillation present at baseline. There was no ST segment deviation noted during stress.  There is a fixed defect present in the mid inferoseptal location.  Nuclear stress EF: 44%.  Soft tissue attenuation without clear evidence of scar or ischemia. Intermediate risk study based on LVEF 44%, although this looks to be underestimated. Suggest echocardiogram to further clarify.  Carotid Dopplers 11/12/2014: Stable 1-39% bilateral ICA stenoses.  Assessment and Plan:  1. Paroxysmal to persistent atrial fibrillation. Heart rate is adequately controlled today on combination of Dilacor XR and Lopressor. She also continues on  Eliquis. Recent hemoglobin and renal function stable, and she denies any bleeding problems.  Follow-up CBC and BMET for her next visit.  2. Symptomatically stable CAD status post DES to the RCA in 2005 and DES to the circumflex in 2012. Recent follow-up Cardiolite study in June 2016 showed no significant ischemic zones. Plan is to continue medical therapy and observation.  3. History of cardiomyopathy with improvement in LVEF, 65-70% within the last 6 months.  4. Intermittent lightheadedness, new since last evaluation. She states that this has resolved within the last few weeks. Etiology is not entirely clear, unlikely to be related to Eliquis.  She does have a history of symptomatic bradycardia in the past, need to keep an eye on heart rate since she is on both calcium channel blocker and beta blocker with atrial fibrillation.  5. Hyperlipidemia , she continues on Crestor.  6. Bilateral carotid artery disease, mild by recent Dopplers.  Current medicines were reviewed with the patient today.   Disposition: FU with me in 6 months.   Signed, Jonelle Sidle, MD, [facility] 04/10/2015 1:31 PM    [facility] [address] [place], [place], [place] [address] Phone: [phone]; Fax: [phone]

## 2015-04-10 NOTE — Patient Instructions (Signed)
Your physician wants you to follow-up in: 6 MONTHS WITH DR. Diona Browner You will receive a reminder letter in the mail two months in advance. If you don't receive a letter, please call our office to schedule the follow-up appointment.  Your physician recommends that you continue on your current medications as directed. Please refer to the Current Medication list given to you today.  WE WILL VERIFY WITH YOUR PHARMACY THAT YOU SHOULD BE TAKING LOPRESSOR 25 MG TWICE DAILY  Your physician recommends that you return for lab work IN 6 MONTHS JUST PRIOR TO YOUR NEXT OFFICE VISIT  Thank you for choosing [facility]!!

## 2015-08-20 ENCOUNTER — Encounter: Payer: Self-pay | Admitting: Cardiology

## 2015-08-20 ENCOUNTER — Ambulatory Visit (INDEPENDENT_AMBULATORY_CARE_PROVIDER_SITE_OTHER): Payer: Medicare Other | Admitting: Cardiology

## 2015-08-20 VITALS — BP 120/75 | HR 108 | Ht 62.0 in | Wt 154.8 lb

## 2015-08-20 DIAGNOSIS — I482 Chronic atrial fibrillation, unspecified: Secondary | ICD-10-CM

## 2015-08-20 DIAGNOSIS — J449 Chronic obstructive pulmonary disease, unspecified: Secondary | ICD-10-CM

## 2015-08-20 DIAGNOSIS — I10 Essential (primary) hypertension: Secondary | ICD-10-CM

## 2015-08-20 DIAGNOSIS — R0602 Shortness of breath: Secondary | ICD-10-CM

## 2015-08-20 DIAGNOSIS — I2510 Atherosclerotic heart disease of native coronary artery without angina pectoris: Secondary | ICD-10-CM | POA: Diagnosis not present

## 2015-08-20 MED ORDER — DILTIAZEM HCL ER 240 MG PO CP24: 240.0000 mg | ORAL_CAPSULE | Freq: Every day | ORAL | Status: DC

## 2015-08-20 NOTE — Progress Notes (Signed)
Cardiology Office Note  Date: 08/20/2015   ID: [name] NormaShirley O Briel, DOB 01/22/1941, MRN [id number]  PCP: Ignatius Speckinghruv B Vyas, MD  Primary Cardiologist: Nona DellSamuel Dura Mccormack, MD   Chief Complaint  Patient presents with  . Coronary Artery Disease  . PAF    History of Present Illness: [name] [name] is a 75 y.o. female last seen in February. She presents for a routine follow-up visit, reports a fairly chronic feeling of shortness of breath and leg weakness, does not necessarily seem to be any worse recently. She does not report any chest tightness, still using supplemental oxygen with COPD. Heart rate has been elevated with atrial fibrillation, we discussed trying to further advance her rate control regimen to see if this is helpful. She reports continued compliance with Eliquis, no bleeding problems.  She underwent cardiac structural and ischemic testing last year, results outlined below. I reviewed her ECG today which shows atrial fibrillation at 111 bpm with poor R wave progression and occasional PVCs versus aberrantly conducted complexes. Also nonspecific T-wave changes.  She tells me that she is to have a skin growth removed from her hand on Monday. We discussed holding Eliquis for 24 hours prior to this.  Past Medical History  Diagnosis Date  . Essential hypertension, benign   . Hyperlipidemia   . Coronary atherosclerosis of native coronary artery     DES RCA 2005, DES circ 2012  . PAF (paroxysmal atrial fibrillation) (HCC)     Eliquis started July 2016  . COPD (chronic obstructive pulmonary disease) (HCC)   . Type 2 diabetes mellitus (HCC)   . Carotid artery disease (HCC)   . Cardiomyopathy (HCC)     LVEF 35% up to 60%  . Hypothyroidism   . Sinus bradycardia     HR 30-40s with associated lightheadedness/presyncoe. AVN blockers held with improvement.    Past Surgical History  Procedure Laterality Date  . Appendectomy    . Cholecystectomy    . Abdominal hysterectomy    . Excision of  melanoma  04/29/2010    Lower left neck    Current Outpatient Prescriptions  Medication Sig Dispense Refill  . albuterol (PROVENTIL) (2.5 MG/3ML) 0.083% nebulizer solution Take 2.5 mg by nebulization every 6 (six) hours as needed.      . ALPRAZolam (XANAX) 0.5 MG tablet Take 0.5 mg by mouth 3 (three) times daily as needed for anxiety.    . Calcium Carbonate-Vit D-Min (CALCIUM 1200) 1200-1000 MG-UNIT CHEW Chew 1 capsule by mouth 2 (two) times daily.      Marland Kitchen. dexlansoprazole (DEXILANT) 60 MG capsule Take 60 mg by mouth daily.      Marland Kitchen. ELIQUIS 5 MG TABS tablet TAKE 1 TABLET BY MOUTH TWICE DAILY 60 tablet 5  . Fluticasone Furoate-Vilanterol (BREO ELLIPTA) 100-25 MCG/INH AEPB Inhale 1 puff into the lungs daily.    Marland Kitchen. glipiZIDE (GLUCOTROL) 10 MG tablet Take 10 mg by mouth 2 (two) times daily before a meal.     . hydrochlorothiazide (HYDRODIURIL) 25 MG tablet Take 1 tablet (25 mg total) by mouth daily. 90 tablet 3  . HYDROcodone-acetaminophen (NORCO/VICODIN) 5-325 MG tablet Take 1 tablet by mouth 2 (two) times daily as needed for moderate pain.    Marland Kitchen. ipratropium (ATROVENT) 0.02 % nebulizer solution Take 500 mcg by nebulization as directed.      Marland Kitchen. levothyroxine (SYNTHROID, LEVOTHROID) 88 MCG tablet Take 88 mcg by mouth daily before breakfast.    . lisinopril (PRINIVIL,ZESTRIL) 40 MG tablet Take 40 mg  by mouth daily.    . meclizine (ANTIVERT) 25 MG tablet Take 12.5 mg by mouth daily as needed for dizziness.    . metoprolol tartrate (LOPRESSOR) 25 MG tablet Take 25 mg by mouth daily.     . nitroGLYCERIN (NITROSTAT) 0.4 MG SL tablet Place 1 tablet (0.4 mg total) under the tongue every 5 (five) minutes x 3 doses as needed for chest pain. For severe chest pain 25 tablet 3  . potassium chloride SA (K-DUR,KLOR-CON) 20 MEQ tablet Take 20 mEq by mouth daily.      . rosuvastatin (CRESTOR) 10 MG tablet Take 1 tablet (10 mg total) by mouth daily. 30 tablet 6  . albuterol (PROAIR HFA) 108 (90 BASE) MCG/ACT inhaler Inhale  2 puffs into the lungs every 6 (six) hours as needed. Reported on 08/20/2015    . diltiazem (DILACOR XR) 240 MG 24 hr capsule Take 1 capsule (240 mg total) by mouth daily. 90 capsule 1   No current facility-administered medications for this visit.   Allergies:  Codeine and Ciprofloxacin   Social History: The patient  reports that she quit smoking about 3 years ago. Her smoking use included Cigarettes. She started smoking about 51 years ago. She has a 22.5 pack-year smoking history. She has never used smokeless tobacco. She reports that she does not drink alcohol or use illicit drugs.   ROS:  Please see the history of present illness. Otherwise, complete review of systems is positive for arthritis pains.  All other systems are reviewed and negative.   Physical Exam: VS:  BP 120/75 mmHg  Pulse 108  Ht  (1.575 m)  Wt 154 lb 12.8 oz (70.217 kg)  BMI 28.31 kg/m2  SpO2 96%, BMI Body mass index is 28.31 kg/(m^2).  Wt Readings from Last 3 Encounters:  08/20/15 154 lb 12.8 oz (70.217 kg)  04/10/15 147 lb 12.8 oz (67.042 kg)  10/29/14 143 lb (64.864 kg)    Appears comfortable. Wearing supplemental oxygen. HEENT: Conjunctiva and lids normal, oropharynx clear.  Neck: Supple, no elevated JVP, soft left carotid bruit, no thyromegaly.  Lungs: Diminished, scattered rhonchi with prolonged expiratory phase, nonlabored breathing at rest.  Cardiac: Irregularly irregular, no S3, soft systolic murmur, no pericardial rub.  Abdomen: Soft, nontender, bowel sounds present.  Extremities: No pitting edema, distal pulses 2+.  Skin: Warm and dry.  Musculoskeletal: No kyphosis.  Neuropsychiatric: Alert and oriented x3, affect grossly appropriate.  ECG: I personally reviewed the tracing from 6/70/2016 which showed atrial fibrillation at 111 bpm with nonspecific ST changes.  Recent Labwork:  January 2017: Hemoglobin 14.0 , platelets 243, BUN 12, creatinine 0.7, potassium 3.6  Other Studies  Reviewed Today:  Echocardiogram 10/05/2014: Study Conclusions  - Left ventricle: The cavity size was normal. Wall thickness was normal. Systolic function was vigorous. The estimated ejection fraction was in the range of 65% to 70%. Wall motion was normal; there were no regional wall motion abnormalities. The study was not technically sufficient to allow evaluation of LV diastolic dysfunction due to atrial fibrillation. - Aortic valve: Mildly calcified annulus. Mildly thickened leaflets. Valve area (VTI): 1.87 cm^2. Valve area (Vmax): 1.76 cm^2. Valve area (Vmean): 2.17 cm^2. - Mitral valve: Mildly calcified annulus. Mildly thickened leaflets . - Left atrium: The atrium was moderately dilated. - Right atrium: The atrium was mildly dilated. - Technically difficult study.  Lexiscan Cardiolite 08/22/2014:  Atrial fibrillation present at baseline. There was no ST segment deviation noted during stress.  There is a fixed  defect present in the mid inferoseptal location.  Nuclear stress EF: 44%.  Soft tissue attenuation without clear evidence of scar or ischemia. Intermediate risk study based on LVEF 44%, although this looks to be underestimated. Suggest echocardiogram to further clarify.  Carotid Dopplers 11/12/2014: Stable 1-39% bilateral ICA stenoses.  Assessment and Plan:  1. Chronic atrial fibrillation. We will increase diltiazem to 240 mg daily to try and achieve better heart rate control in case this is related to her shortness of breath. I suspect that her dyspnea is multifactorial with COPD as well however. Otherwise continue on Lopressor and Eliquis.  2. CAD status post DES to the RCA in 2005 and DES to the circumflex in 2012. Grey-white stay from last year showed no active ischemic zones and her LVEF was 65-70% by echocardiogram.  3. COPD requiring oxygen.  4. Essential hypertension, LVEF is well controlled today.  Current medicines were reviewed with the  patient today.   Orders Placed This Encounter  Procedures  . EKG 12-Lead    Disposition: Follow-up with me in 3 months.  Signed, Jonelle SidleSamuel G. Marzelle Rutten, MD, [facility] 08/20/2015 10:37 AM    [facility] [address] [place], [place], [place] [address] Phone: [phone]; Fax: [phone]

## 2015-08-20 NOTE — Patient Instructions (Signed)
Medication Instructions:  Your physician has recommended you make the following change in your medication:   Increase diltiazem to 240 mg daily.  Continue all other medications the same.  Labwork: NONE  Testing/Procedures: NONE  Follow-Up: Your physician recommends that you schedule a follow-up appointment in: 3 months.   Any Other Special Instructions Will Be Listed Below (If Applicable).  If you need a refill on your cardiac medications before your next appointment, please call your pharmacy.

## 2015-09-07 ENCOUNTER — Inpatient Hospital Stay (HOSPITAL_COMMUNITY)
Admission: AD | Admit: 2015-09-07 | Discharge: 2015-09-11 | DRG: 291 | Disposition: A | Discharge status: Home Health | Payer: Medicare Other | Source: Other Acute Inpatient Hospital | Attending: Internal Medicine | Admitting: Internal Medicine

## 2015-09-07 ENCOUNTER — Encounter (HOSPITAL_COMMUNITY): Payer: Self-pay | Admitting: Anesthesiology

## 2015-09-07 ENCOUNTER — Inpatient Hospital Stay (HOSPITAL_COMMUNITY): Payer: Medicare Other

## 2015-09-07 DIAGNOSIS — I5033 Acute on chronic diastolic (congestive) heart failure: Secondary | ICD-10-CM | POA: Diagnosis present

## 2015-09-07 DIAGNOSIS — I110 Hypertensive heart disease with heart failure: Secondary | ICD-10-CM | POA: Diagnosis not present

## 2015-09-07 DIAGNOSIS — J9621 Acute and chronic respiratory failure with hypoxia: Secondary | ICD-10-CM | POA: Diagnosis not present

## 2015-09-07 DIAGNOSIS — J440 Chronic obstructive pulmonary disease with acute lower respiratory infection: Secondary | ICD-10-CM | POA: Diagnosis present

## 2015-09-07 DIAGNOSIS — Z85820 Personal history of malignant melanoma of skin: Secondary | ICD-10-CM | POA: Diagnosis not present

## 2015-09-07 DIAGNOSIS — E039 Hypothyroidism, unspecified: Secondary | ICD-10-CM | POA: Diagnosis not present

## 2015-09-07 DIAGNOSIS — I482 Chronic atrial fibrillation, unspecified: Secondary | ICD-10-CM | POA: Diagnosis present

## 2015-09-07 DIAGNOSIS — Z87891 Personal history of nicotine dependence: Secondary | ICD-10-CM

## 2015-09-07 DIAGNOSIS — J441 Chronic obstructive pulmonary disease with (acute) exacerbation: Secondary | ICD-10-CM | POA: Diagnosis not present

## 2015-09-07 DIAGNOSIS — J9620 Acute and chronic respiratory failure, unspecified whether with hypoxia or hypercapnia: Secondary | ICD-10-CM | POA: Diagnosis present

## 2015-09-07 DIAGNOSIS — I2510 Atherosclerotic heart disease of native coronary artery without angina pectoris: Secondary | ICD-10-CM | POA: Diagnosis present

## 2015-09-07 DIAGNOSIS — Z955 Presence of coronary angioplasty implant and graft: Secondary | ICD-10-CM | POA: Diagnosis not present

## 2015-09-07 DIAGNOSIS — T380X5A Adverse effect of glucocorticoids and synthetic analogues, initial encounter: Secondary | ICD-10-CM | POA: Diagnosis present

## 2015-09-07 DIAGNOSIS — Z7984 Long term (current) use of oral hypoglycemic drugs: Secondary | ICD-10-CM

## 2015-09-07 DIAGNOSIS — I420 Dilated cardiomyopathy: Secondary | ICD-10-CM | POA: Diagnosis present

## 2015-09-07 DIAGNOSIS — I10 Essential (primary) hypertension: Secondary | ICD-10-CM | POA: Diagnosis present

## 2015-09-07 DIAGNOSIS — R410 Disorientation, unspecified: Secondary | ICD-10-CM

## 2015-09-07 DIAGNOSIS — Z794 Long term (current) use of insulin: Secondary | ICD-10-CM | POA: Diagnosis not present

## 2015-09-07 DIAGNOSIS — Z9861 Coronary angioplasty status: Secondary | ICD-10-CM

## 2015-09-07 DIAGNOSIS — Z7901 Long term (current) use of anticoagulants: Secondary | ICD-10-CM

## 2015-09-07 DIAGNOSIS — E1151 Type 2 diabetes mellitus with diabetic peripheral angiopathy without gangrene: Secondary | ICD-10-CM | POA: Diagnosis present

## 2015-09-07 DIAGNOSIS — R0600 Dyspnea, unspecified: Secondary | ICD-10-CM | POA: Diagnosis not present

## 2015-09-07 DIAGNOSIS — E118 Type 2 diabetes mellitus with unspecified complications: Secondary | ICD-10-CM

## 2015-09-07 DIAGNOSIS — E038 Other specified hypothyroidism: Secondary | ICD-10-CM | POA: Diagnosis present

## 2015-09-07 DIAGNOSIS — R451 Restlessness and agitation: Secondary | ICD-10-CM | POA: Diagnosis not present

## 2015-09-07 DIAGNOSIS — Z9981 Dependence on supplemental oxygen: Secondary | ICD-10-CM

## 2015-09-07 DIAGNOSIS — E785 Hyperlipidemia, unspecified: Secondary | ICD-10-CM | POA: Diagnosis present

## 2015-09-07 DIAGNOSIS — J189 Pneumonia, unspecified organism: Secondary | ICD-10-CM | POA: Diagnosis not present

## 2015-09-07 DIAGNOSIS — E1165 Type 2 diabetes mellitus with hyperglycemia: Secondary | ICD-10-CM | POA: Diagnosis present

## 2015-09-07 DIAGNOSIS — J9601 Acute respiratory failure with hypoxia: Secondary | ICD-10-CM

## 2015-09-07 DIAGNOSIS — I4891 Unspecified atrial fibrillation: Secondary | ICD-10-CM | POA: Diagnosis not present

## 2015-09-07 DIAGNOSIS — IMO0002 Reserved for concepts with insufficient information to code with codable children: Secondary | ICD-10-CM | POA: Diagnosis present

## 2015-09-07 DIAGNOSIS — R0602 Shortness of breath: Secondary | ICD-10-CM | POA: Diagnosis present

## 2015-09-07 DIAGNOSIS — Z79899 Other long term (current) drug therapy: Secondary | ICD-10-CM

## 2015-09-07 HISTORY — DX: Chronic atrial fibrillation, unspecified: I48.20

## 2015-09-07 HISTORY — DX: Coronary angioplasty status: Z98.61

## 2015-09-07 HISTORY — DX: Atherosclerotic heart disease of native coronary artery without angina pectoris: I25.10

## 2015-09-07 LAB — MRSA PCR SCREENING: MRSA BY PCR: NEGATIVE

## 2015-09-07 LAB — GLUCOSE, CAPILLARY
GLUCOSE-CAPILLARY: 332 mg/dL — AB (ref 65–99)
GLUCOSE-CAPILLARY: 214 mg/dL — AB (ref 65–99)
GLUCOSE-CAPILLARY: 156 mg/dL — AB (ref 65–99)

## 2015-09-07 LAB — BASIC METABOLIC PANEL
ANION GAP: 8 (ref 5–15)
BUN: 21 mg/dL — ABNORMAL HIGH (ref 6–20)
CALCIUM: 8.9 mg/dL (ref 8.9–10.3)
CO2: 33 mmol/L — AB (ref 22–32)
Chloride: 95 mmol/L — ABNORMAL LOW (ref 101–111)
Creatinine, Ser: 0.98 mg/dL (ref 0.44–1.00)
GFR, EST NON AFRICAN AMERICAN: 55 mL/min — AB (ref >60)
GLUCOSE: 262 mg/dL — AB (ref 65–99)
POTASSIUM: 3.2 mmol/L — AB (ref 3.5–5.1)
Sodium: 136 mmol/L (ref 135–145)

## 2015-09-07 LAB — CBC
HEMATOCRIT: 45.7 % (ref 36.0–46.0)
Hemoglobin: 13.9 g/dL (ref 12.0–15.0)
MCH: 30.2 pg (ref 26.0–34.0)
MCHC: 30.4 g/dL (ref 30.0–36.0)
MCV: 99.1 fL (ref 78.0–100.0)
PLATELETS: 249 10*3/uL (ref 150–400)
RBC: 4.61 MIL/uL (ref 3.87–5.11)
RDW: 14.5 % (ref 11.5–15.5)
WBC: 13.9 10*3/uL — AB (ref 4.0–10.5)

## 2015-09-07 LAB — BRAIN NATRIURETIC PEPTIDE: B Natriuretic Peptide: 297 pg/mL — ABNORMAL HIGH (ref 0.0–100.0)

## 2015-09-07 LAB — PROCALCITONIN: PROCALCITONIN: 0.11 ng/mL

## 2015-09-07 LAB — MAGNESIUM: Magnesium: 1.8 mg/dL (ref 1.7–2.4)

## 2015-09-07 LAB — PHOSPHORUS: PHOSPHORUS: 3.5 mg/dL (ref 2.5–4.6)

## 2015-09-07 MED ORDER — METHYLPREDNISOLONE SODIUM SUCC 125 MG IJ SOLR
60.0000 mg | Freq: Three times a day (TID) | INTRAMUSCULAR | Status: DC
Start: 2015-09-07 — End: 2015-09-08
  Administered 2015-09-07 – 2015-09-08 (×5): 60 mg via INTRAVENOUS
  Filled 2015-09-07 (×5): qty 2

## 2015-09-07 MED ORDER — CETYLPYRIDINIUM CHLORIDE 0.05 % MT LIQD
7.0000 mL | Freq: Two times a day (BID) | OROMUCOSAL | Status: DC
  Administered 2015-09-07 – 2015-09-11 (×3): 7 mL via OROMUCOSAL

## 2015-09-07 MED ORDER — HEPARIN SODIUM (PORCINE) 5000 UNIT/ML IJ SOLN: 5000.0000 [IU] | Freq: Three times a day (TID) | INTRAMUSCULAR | Status: DC

## 2015-09-07 MED ORDER — ACETAMINOPHEN 325 MG PO TABS: 650.0000 mg | ORAL_TABLET | ORAL | Status: DC | PRN

## 2015-09-07 MED ORDER — POTASSIUM CHLORIDE CRYS ER 20 MEQ PO TBCR
40.0000 meq | EXTENDED_RELEASE_TABLET | Freq: Once | ORAL | Status: AC
  Administered 2015-09-07: 40 meq via ORAL
  Filled 2015-09-07: qty 2

## 2015-09-07 MED ORDER — ALBUTEROL SULFATE (2.5 MG/3ML) 0.083% IN NEBU: 2.5000 mg | INHALATION_SOLUTION | RESPIRATORY_TRACT | Status: DC | PRN

## 2015-09-07 MED ORDER — LEVOTHYROXINE SODIUM 88 MCG PO TABS
88.0000 ug | ORAL_TABLET | Freq: Every day | ORAL | Status: DC
  Administered 2015-09-07 – 2015-09-11 (×5): 88 ug via ORAL
  Filled 2015-09-07 (×5): qty 1

## 2015-09-07 MED ORDER — DILTIAZEM HCL 100 MG IV SOLR
5.0000 mg/h | INTRAVENOUS | Status: DC
  Administered 2015-09-07: 10 mg/h via INTRAVENOUS
  Filled 2015-09-07: qty 100

## 2015-09-07 MED ORDER — ALPRAZOLAM 0.25 MG PO TABS
0.2500 mg | ORAL_TABLET | Freq: Two times a day (BID) | ORAL | Status: DC | PRN
  Administered 2015-09-07 – 2015-09-10 (×6): 0.25 mg via ORAL
  Filled 2015-09-07 (×6): qty 1

## 2015-09-07 MED ORDER — APIXABAN 5 MG PO TABS
5.0000 mg | ORAL_TABLET | Freq: Two times a day (BID) | ORAL | Status: DC
  Administered 2015-09-07 – 2015-09-11 (×8): 5 mg via ORAL
  Filled 2015-09-07 (×8): qty 1

## 2015-09-07 MED ORDER — DEXTROSE 5 % IV SOLN
500.0000 mg | INTRAVENOUS | Status: DC
  Administered 2015-09-07 – 2015-09-10 (×4): 500 mg via INTRAVENOUS
  Filled 2015-09-07 (×5): qty 500

## 2015-09-07 MED ORDER — DEXTROSE 5 % IV SOLN
1.0000 g | INTRAVENOUS | Status: DC
  Administered 2015-09-07 – 2015-09-10 (×4): 1 g via INTRAVENOUS
  Filled 2015-09-07 (×5): qty 10

## 2015-09-07 MED ORDER — IPRATROPIUM-ALBUTEROL 0.5-2.5 (3) MG/3ML IN SOLN
3.0000 mL | Freq: Four times a day (QID) | RESPIRATORY_TRACT | Status: DC
  Administered 2015-09-07 – 2015-09-08 (×4): 3 mL via RESPIRATORY_TRACT
  Filled 2015-09-07 (×5): qty 3

## 2015-09-07 MED ORDER — METOPROLOL TARTRATE 25 MG PO TABS
25.0000 mg | ORAL_TABLET | Freq: Every day | ORAL | Status: DC
  Administered 2015-09-07 – 2015-09-10 (×4): 25 mg via ORAL
  Filled 2015-09-07 (×4): qty 1

## 2015-09-07 MED ORDER — ROSUVASTATIN CALCIUM 10 MG PO TABS
10.0000 mg | ORAL_TABLET | Freq: Every day | ORAL | Status: DC
  Administered 2015-09-07 – 2015-09-11 (×5): 10 mg via ORAL
  Filled 2015-09-07 (×5): qty 1

## 2015-09-07 MED ORDER — INSULIN ASPART 100 UNIT/ML ~~LOC~~ SOLN
0.0000 [IU] | SUBCUTANEOUS | Status: DC
  Administered 2015-09-07: 4 [IU] via SUBCUTANEOUS
  Administered 2015-09-07: 15 [IU] via SUBCUTANEOUS
  Administered 2015-09-07: 7 [IU] via SUBCUTANEOUS
  Administered 2015-09-08: 11 [IU] via SUBCUTANEOUS
  Administered 2015-09-08: 15 [IU] via SUBCUTANEOUS
  Administered 2015-09-08 (×4): 4 [IU] via SUBCUTANEOUS
  Administered 2015-09-08: 7 [IU] via SUBCUTANEOUS
  Administered 2015-09-09: 20 [IU] via SUBCUTANEOUS
  Administered 2015-09-09 (×2): 4 [IU] via SUBCUTANEOUS

## 2015-09-07 MED ORDER — DILTIAZEM HCL ER COATED BEADS 240 MG PO CP24
240.0000 mg | ORAL_CAPSULE | Freq: Every day | ORAL | Status: DC
  Administered 2015-09-07 – 2015-09-09 (×3): 240 mg via ORAL
  Filled 2015-09-07 (×3): qty 1

## 2015-09-07 NOTE — Progress Notes (Signed)
   09/07/15 1400  Clinical Encounter Type  Visited With Patient and family together  Visit Type Other (Comment) (AD)  Referral From Other (Comment) (Secr.)  Stress Factors  Patient Stress Factors Health changes  Patient requested AD be finalized before 6:00am surgery. Completed. Rodney BoozeGail L Adriana Quinby 09/07/2015

## 2015-09-07 NOTE — Consult Note (Signed)
Reason for Consult:   AF with RVR, diastolic CHF  Requesting Physician: CCM Primary Cardiologist Dr Diona BrownerMcDowell  HPI:   75 y/o female from [place], followed by Dr Diona BrownerMcDowell with a history of CAF, on Eliquis, COPD-on home O2, DM, HTN, HLD, CAD-s/p remote PCI, and past cardiomyopathy though her last echo Aug 2016 showed her EF to be 65-70%  (diastolic dysfunction unable to be assessed secondary to AF). The pt's last Myoview was June 2016 and showed a fixed defect present in the mid inferoseptal location. She last saw Dr Diona BrownerMcDowell 08/20/15. She denied any chest pain. She has had chronic weakness, the daughter says has been gradually worsening over the past 2 months. The pt tells me she was placed on steroids about 10 days ago secondary to wheezing. The pt says she went [facility] because of SOB, the daughter says she brought the pt to the hospital secondary to weakness. At [facility] was treated for COPD with excerebration and diastolic CHF (though no BNP was done). CXR showed Rt middle and lower lobe consolidation and the pt was placed on ABs. This morning the pt's daughter spoke to her mother on the phone and her mother sounded confused. The daughter [facility] and [facility]. The pt denies orthopnea, PND, or significant edema to me. She is awake, alert, and oriented. Her AF rates were fast when she was [facility], but controlled now.    PMHx:  Past Medical History  Diagnosis Date  . Essential hypertension, benign   . Hyperlipidemia   . Coronary atherosclerosis of native coronary artery     DES RCA 2005, DES circ 2012  . Chronic a-fib (HCC)     Eliquis started July 2016  . COPD (chronic obstructive pulmonary disease) (HCC)     Home O2  . Type 2 diabetes mellitus (HCC)   . Carotid artery disease (HCC)   . Cardiomyopathy (HCC)     EF 65-70% Aug 2016 echo  . Hypothyroidism   . Sinus bradycardia     HR 30-40s with associated  lightheadedness/presyncoe. AVN blockers held with improvement.  Marland Kitchen. CAD S/P percutaneous coronary angioplasty 2005    Myoview low risk June 2016    Past Surgical History  Procedure Laterality Date  . Appendectomy    . Cholecystectomy    . Abdominal hysterectomy    . Excision of melanoma  04/29/2010    Lower left neck    SOCHx:  reports that she quit smoking about 3 years ago. Her smoking use included Cigarettes. She started smoking about 51 years ago. She has a 22.5 pack-year smoking history. She has never used smokeless tobacco. She reports that she does not drink alcohol or use illicit drugs.  FAMHx: Family History  Problem Relation Age of Onset  . Heart disease Brother   . Colon cancer Mother   . Throat cancer Father     ALLERGIES: Allergies  Allergen Reactions  . Adalat [Nifedipine]   . Claritin-D 12 Hour [Loratadine-Pseudoephedrine Er]   . Codeine     REACTION: stomach upset  . Plavix [Clopidogrel Bisulfate]   . Wellbutrin [Bupropion]   . Ciprofloxacin Nausea And Vomiting  . Metformin And Related Other (See Comments)    Fatigue      ROS: Review of Systems: General: negative for chills, fever, night sweats or weight changes.  Cardiovascular: negative for chest pain, edema, orthopnea, palpitations HEENT: negative for any visual  disturbances, blindness, glaucoma Dermatological: negative for rash Respiratory: negative for, hemoptysis Urologic: negative for hematuria or dysuria Abdominal: negative for nausea, vomiting, diarrhea, bright red blood per rectum, melena, or hematemesis Neurologic: negative for visual changes, syncope, or dizziness Musculoskeletal: negative for back pain, joint pain, or swelling Psych: cooperative and appropriate All other systems reviewed and are otherwise negative except as noted above.   HOME MEDICATIONS: Prior to Admission medications   Medication Sig Start Date End Date Taking? Authorizing Provider  albuterol (PROAIR HFA) 108 (90  BASE) MCG/ACT inhaler Inhale 2 puffs into the lungs every 6 (six) hours as needed. Reported on 08/20/2015    Historical Provider, MD  albuterol (PROVENTIL) (2.5 MG/3ML) 0.083% nebulizer solution Take 2.5 mg by nebulization every 6 (six) hours as needed.      Historical Provider, MD  ALPRAZolam Prudy Feeler) 0.5 MG tablet Take 0.5 mg by mouth 3 (three) times daily as needed for anxiety.    Historical Provider, MD  Calcium Carbonate-Vit D-Min (CALCIUM 1200) 1200-1000 MG-UNIT CHEW Chew 1 capsule by mouth 2 (two) times daily.      Historical Provider, MD  dexlansoprazole (DEXILANT) 60 MG capsule Take 60 mg by mouth daily.      Historical Provider, MD  diltiazem (DILACOR XR) 240 MG 24 hr capsule Take 1 capsule (240 mg total) by mouth daily. 08/20/15   Jonelle Sidle, MD  ELIQUIS 5 MG TABS tablet TAKE 1 TABLET BY MOUTH TWICE DAILY 09/30/14   Rollene Rotunda, MD  Fluticasone Furoate-Vilanterol (BREO ELLIPTA) 100-25 MCG/INH AEPB Inhale 1 puff into the lungs daily.    Historical Provider, MD  glipiZIDE (GLUCOTROL) 10 MG tablet Take 10 mg by mouth 2 (two) times daily before a meal.     Historical Provider, MD  hydrochlorothiazide (HYDRODIURIL) 25 MG tablet Take 1 tablet (25 mg total) by mouth daily. 06/11/12   Jonelle Sidle, MD  HYDROcodone-acetaminophen (NORCO/VICODIN) 5-325 MG tablet Take 1 tablet by mouth 2 (two) times daily as needed for moderate pain.    Historical Provider, MD  ipratropium (ATROVENT) 0.02 % nebulizer solution Take 500 mcg by nebulization as directed.      Historical Provider, MD  levothyroxine (SYNTHROID, LEVOTHROID) 88 MCG tablet Take 88 mcg by mouth daily before breakfast.    Historical Provider, MD  lisinopril (PRINIVIL,ZESTRIL) 40 MG tablet Take 40 mg by mouth daily.    Historical Provider, MD  meclizine (ANTIVERT) 25 MG tablet Take 12.5 mg by mouth daily as needed for dizziness.    Historical Provider, MD  metoprolol tartrate (LOPRESSOR) 25 MG tablet Take 25 mg by mouth daily.      Historical Provider, MD  nitroGLYCERIN (NITROSTAT) 0.4 MG SL tablet Place 1 tablet (0.4 mg total) under the tongue every 5 (five) minutes x 3 doses as needed for chest pain. For severe chest pain 08/07/14   Jonelle Sidle, MD  potassium chloride SA (K-DUR,KLOR-CON) 20 MEQ tablet Take 20 mEq by mouth daily.      Historical Provider, MD  rosuvastatin (CRESTOR) 10 MG tablet Take 1 tablet (10 mg total) by mouth daily. 10/05/10   June Leap, MD    HOSPITAL MEDICATIONS: I have reviewed the patient's current medications.  VITALS: Blood pressure 129/58, pulse 91, temperature 98.2 F (36.8 C), temperature source Oral, resp. rate 23, height 5\' 2"  (1.575 m), weight 155 lb 6.8 oz (70.5 kg), SpO2 94 %.  PHYSICAL EXAM: General appearance: alert, cooperative, no distress and on O2 Neck: no carotid bruit and  no JVD Lungs: expiratory wheezing noted Heart: irregularly irregular rhythm Abdomen: soft, non-tender; bowel sounds normal; no masses,  no organomegaly Extremities: extremities normal, atraumatic, no cyanosis or edema Pulses: 2+ and symmetric Skin: pale, cool, dry Neurologic: Grossly normal  LABS: Results for orders placed or performed during the hospital encounter of 09/07/15 (from the past 24 hour(s))  Glucose, capillary     Status: Abnormal   Collection Time: 09/07/15 11:38 AM  Result Value Ref Range   Glucose-Capillary 332 (H) 65 - 99 mg/dL   Comment 1 Capillary Specimen   CBC     Status: Abnormal   Collection Time: 09/07/15 12:28 PM  Result Value Ref Range   WBC 13.9 (H) 4.0 - 10.5 K/uL   RBC 4.61 3.87 - 5.11 MIL/uL   Hemoglobin 13.9 12.0 - 15.0 g/dL   HCT 16.1 09.6 - 04.5 %   MCV 99.1 78.0 - 100.0 fL   MCH 30.2 26.0 - 34.0 pg   MCHC 30.4 30.0 - 36.0 g/dL   RDW 40.9 81.1 - 91.4 %   Platelets 249 150 - 400 K/uL  Basic metabolic panel     Status: Abnormal   Collection Time: 09/07/15 12:28 PM  Result Value Ref Range   Sodium 136 135 - 145 mmol/L   Potassium 3.2 (L) 3.5 - 5.1  mmol/L   Chloride 95 (L) 101 - 111 mmol/L   CO2 33 (H) 22 - 32 mmol/L   Glucose, Bld 262 (H) 65 - 99 mg/dL   BUN 21 (H) 6 - 20 mg/dL   Creatinine, Ser 7.82 0.44 - 1.00 mg/dL   Calcium 8.9 8.9 - 95.6 mg/dL   GFR calc non Af Amer 55 (L) >60 mL/min   GFR calc Af Amer >60 >60 mL/min   Anion gap 8 5 - 15  Magnesium     Status: None   Collection Time: 09/07/15 12:28 PM  Result Value Ref Range   Magnesium 1.8 1.7 - 2.4 mg/dL  Phosphorus     Status: None   Collection Time: 09/07/15 12:28 PM  Result Value Ref Range   Phosphorus 3.5 2.5 - 4.6 mg/dL  Brain natriuretic peptide     Status: Abnormal   Collection Time: 09/07/15 12:28 PM  Result Value Ref Range   B Natriuretic Peptide 297.0 (H) 0.0 - 100.0 pg/mL    EKG: pending  IMAGING: Dg Chest Port 1 View  09/07/2015  CLINICAL DATA:  Cough and shortness of Breath EXAM: PORTABLE CHEST 1 VIEW COMPARISON:  09/03/2015 FINDINGS: Cardiac shadow remains enlarged. The left lung remains clear. Patchy changes are again noted in the right lung base stable from the previous exam. No new focal abnormality is noted. IMPRESSION: No change in right basilar infiltrates. Electronically Signed   By: Alcide Clever M.D.   On: 09/07/2015 13:43    IMPRESSION: Principal Problem:   Acute on chronic respiratory failure (HCC) Active Problems:   COPD with acute exacerbation (HCC)   Atrial fibrillation with RVR (HCC)   Essential hypertension, benign   CAD S/P percutaneous coronary angioplasty   Chronic atrial fibrillation (HCC)   Chronic anticoagulation-CHADs VASc=6   Type 2 diabetes with decreased circulation (HCC)   Dyslipidemia   RECOMMENDATION: Acute on chronic respiratory failure and possibly diastolic CHF as well. Check ech, replace K+. She is on IV Diltiazem for rate control, this could be changed to po tomorrow if rate stable. Will follow. Troponin was negative at [facility]. MD to see.   Time Spent Directly with  Patient: [address] minutes  Corine Shelter, [place]    [phone] beeper 09/07/2015, 2:23 PM   I have seen and examined the patient along with Corine Shelter, PA  I have reviewed the chart, notes and new data.  I agree with PA/NP's note.  Key new complaints: weak, but alert and oriented Key examination changes: reduced breath sounds right base, irregular rhythm Key new findings / data: BNP elevation is relatively mild; no baseline available. Fairly recent echo and nuclear study with normal LVEF. CXR with RLL infiltrate.  PLAN: Evaluate repeat echo (consider tachycardia related CMP due to recent inadequate rate control). ABx/steroids per PCCM for lung infiltrate. Continue anticoagulation.  Thurmon Fair, MD, [facility] [phone] 09/07/2015, 3:19 PM

## 2015-09-07 NOTE — Care Management Note (Signed)
Case Management Note  Patient Details  Name: [name] [name] MRN: [id number] Date of Birth: 10/21/1940  Subjective/Objective:      Adm from [facility] w pneumonia and at fib              Action/Plan: lives w fam, pcp dr vyas   Expected Discharge Date:                  Expected Discharge Plan:  Home w Home Health Services  In-House Referral:     Discharge planning Services     Post Acute Care Choice:    Choice offered to:     DME Arranged:    DME Agency:     HH Arranged:    HH Agency:     Status of Service:     If discussed at [employer] Meetings, dates discussed:    Additional Comments: will moniter for [facility] as pt progesses.  Hanley Haysowell, Duval Macleod T, RN 09/07/2015, 12:25 PM

## 2015-09-07 NOTE — H&P (Signed)
PULMONARY / CRITICAL CARE MEDICINE   Name: [name] [name] MRN: [id number] DOB: 03-Apr-1940    ADMISSION DATE:  09/07/2015   [facility]   CHIEF COMPLAINT:  CAP, acute on chronic diastolic heart failure   HISTORY OF PRESENT ILLNESS:   75yo female with hx HTN, CAD, PAF, COPD, dCHF initially admitted 7/6 to [facility] hospital with with progressive SOB, weakness and wheezing, cough.  She was treated for CAP, AECOPD and acute on chronic diastolic heart failure. She was treated with levaquin and IV steroids.  Course was c/b AFib with RVR requiring cardizem gtt as well as delirium.  Pt and family requested tx to [facility] 7/10.   Currently feeling some better.  Still some SOB but overall improved.  Denies chest pain, hemoptysis, purulent sputum, lightheadedness, chills.   PAST MEDICAL HISTORY :  She  has a past medical history of Essential hypertension, benign; Hyperlipidemia; Coronary atherosclerosis of native coronary artery; PAF (paroxysmal atrial fibrillation) (HCC); COPD (chronic obstructive pulmonary disease) (HCC); Type 2 diabetes mellitus (HCC); Carotid artery disease (HCC); Cardiomyopathy (HCC); Hypothyroidism; and Sinus bradycardia.  PAST SURGICAL HISTORY: She  has past surgical history that includes Appendectomy; Cholecystectomy; Abdominal hysterectomy; and Excision of Melanoma (04/29/2010).  Allergies  Allergen Reactions  . Adalat [Nifedipine]   . Claritin-D 12 Hour [Loratadine-Pseudoephedrine Er]   . Codeine     REACTION: stomach upset  . Plavix [Clopidogrel Bisulfate]   . Wellbutrin [Bupropion]   . Ciprofloxacin Nausea And Vomiting  . Metformin And Related Other (See Comments)    Fatigue      No current facility-administered medications on file prior to encounter.   Current Outpatient Prescriptions on File Prior to Encounter  Medication Sig  . albuterol (PROAIR HFA) 108 (90 BASE) MCG/ACT inhaler Inhale 2 puffs into the lungs every 6 (six) hours as  needed. Reported on 08/20/2015  . albuterol (PROVENTIL) (2.5 MG/3ML) 0.083% nebulizer solution Take 2.5 mg by nebulization every 6 (six) hours as needed.    . ALPRAZolam (XANAX) 0.5 MG tablet Take 0.5 mg by mouth 3 (three) times daily as needed for anxiety.  . Calcium Carbonate-Vit D-Min (CALCIUM 1200) 1200-1000 MG-UNIT CHEW Chew 1 capsule by mouth 2 (two) times daily.    Marland Kitchen dexlansoprazole (DEXILANT) 60 MG capsule Take 60 mg by mouth daily.    Marland Kitchen diltiazem (DILACOR XR) 240 MG 24 hr capsule Take 1 capsule (240 mg total) by mouth daily.  Marland Kitchen ELIQUIS 5 MG TABS tablet TAKE 1 TABLET BY MOUTH TWICE DAILY  . Fluticasone Furoate-Vilanterol (BREO ELLIPTA) 100-25 MCG/INH AEPB Inhale 1 puff into the lungs daily.  Marland Kitchen glipiZIDE (GLUCOTROL) 10 MG tablet Take 10 mg by mouth 2 (two) times daily before a meal.   . hydrochlorothiazide (HYDRODIURIL) 25 MG tablet Take 1 tablet (25 mg total) by mouth daily.  Marland Kitchen HYDROcodone-acetaminophen (NORCO/VICODIN) 5-325 MG tablet Take 1 tablet by mouth 2 (two) times daily as needed for moderate pain.  Marland Kitchen ipratropium (ATROVENT) 0.02 % nebulizer solution Take 500 mcg by nebulization as directed.    Marland Kitchen levothyroxine (SYNTHROID, LEVOTHROID) 88 MCG tablet Take 88 mcg by mouth daily before breakfast.  . lisinopril (PRINIVIL,ZESTRIL) 40 MG tablet Take 40 mg by mouth daily.  . meclizine (ANTIVERT) 25 MG tablet Take 12.5 mg by mouth daily as needed for dizziness.  . metoprolol tartrate (LOPRESSOR) 25 MG tablet Take 25 mg by mouth daily.   . nitroGLYCERIN (NITROSTAT) 0.4 MG SL tablet Place 1 tablet (0.4 mg total) under the  tongue every 5 (five) minutes x 3 doses as needed for chest pain. For severe chest pain  . potassium chloride SA (K-DUR,KLOR-CON) 20 MEQ tablet Take 20 mEq by mouth daily.    . rosuvastatin (CRESTOR) 10 MG tablet Take 1 tablet (10 mg total) by mouth daily.    FAMILY HISTORY:  Her indicated that her mother is deceased. She indicated that her father is deceased. She indicated  that her brother is deceased.   SOCIAL HISTORY: She  reports that she quit smoking about 3 years ago. Her smoking use included Cigarettes. She started smoking about 51 years ago. She has a 22.5 pack-year smoking history. She has never used smokeless tobacco. She reports that she does not drink alcohol or use illicit drugs.  REVIEW OF SYSTEMS:   As per HPI - All other systems reviewed and were neg.    SUBJECTIVE:    VITAL SIGNS: BP 126/80 mmHg  Pulse 94  Temp(Src) 98.2 F (36.8 C) (Oral)  Resp 24  Ht 5\' 2"  (1.575 m)  Wt 70.5 kg (155 lb 6.8 oz)  BMI 28.42 kg/m2  SpO2 94%   INTAKE / OUTPUT:    PHYSICAL EXAMINATION: General:  Pleasant chronically ill appearing female, NAD  Neuro:  Awake, alert, mild confusion at times but oriented to place HEENT:  Mm moist, no JVD  Cardiovascular:  s1s2 irreg, AFib 110's  Lungs:  resps even non labored on 2L [place], exp wheeze throughout  Abdomen:  Round, soft, +bs  Musculoskeletal:  Warm and dry, scant BLE edema   LABS:  BMET No results for input(s): NA, K, CL, CO2, BUN, CREATININE, GLUCOSE in the last 168 hours.  Electrolytes No results for input(s): CALCIUM, MG, PHOS in the last 168 hours.  CBC No results for input(s): WBC, HGB, HCT, PLT in the last 168 hours.  Coag's No results for input(s): APTT, INR in the last 168 hours.  Sepsis Markers No results for input(s): LATICACIDVEN, PROCALCITON, O2SATVEN in the last 168 hours.  ABG No results for input(s): PHART, PCO2ART, PO2ART in the last 168 hours.  Liver Enzymes No results for input(s): AST, ALT, ALKPHOS, BILITOT, ALBUMIN in the last 168 hours.  Cardiac Enzymes No results for input(s): TROPONINI, PROBNP in the last 168 hours.  Glucose No results for input(s): GLUCAP in the last 168 hours.  Imaging No results found.   STUDIES:  2D echo 7/10>>>  CULTURES:   ANTIBIOTICS: Rocephin 7/10>>> Azithro 7/10>>>  SIGNIFICANT  EVENTS:   LINES/TUBES:   DISCUSSION: 75yo female with respiratory failure r/t CAP and acute on chronic diastolic heart failure c/b Afib with RVR and delirium.   ASSESSMENT / PLAN:  PULMONARY Acute respiratory failure - improving  CAP  P:   F/u CXR now  abx as above  Pulmonary hygiene  Supplemental O2 as needed to keep sats >92% HR control  IV steroids  BD's   CARDIOVASCULAR Afib with RVR  Acute on chronic dCHF  P:  cardizem gtt - titrate to keep HR <110 Continue home eliquis  Continue home lopressor  2D echo pending  Consider gentle diuresis as BP and Scr tol -- await CXR   RENAL No active issue  P:   F/u chem now  GASTROINTESTINAL No active issue  P:   Clear liquid diet   HEMATOLOGIC Anticoagulation needs  P:  Continue eliquis  F/u CBC   INFECTIOUS Presumed CAP  P:   Rocephin, azithromycin as above  Check pct  Trend wbc, fever curve  Sputum culture if able   ENDOCRINE A:   Uncontrolled DM  Hypothyroid    P:   Continue synthroid  SSI  May need to add lantus   NEUROLOGIC Reported ICU delirium at outside hospital -- now seems calm, mostly oriented  P:   Frequent orientation  Low dose PRN xanax (home med)  Supportive care    FAMILY  - Updates:  Pt updated at bedside 7/10    Dirk Dress, NP 09/07/2015  12:03 PM Pager: [phone] or [phone]   75 yo female presented to [facility] with dyspnea, cough, wheeze, and weakness.  Found to have PNA and a fib with RVR.  She developed delirium and remained on cardizem gtt.  Transferred to [facility] for further management.  She still has wheeze and cough.  Denies chest/abd pain.  Alert, follows commands, b/l expiratory wheeze, HR irregular, Abd soft, no edema.  CXR from 7/06 >> Rt lower ASD  Assessment/plan:  Acute hypoxic respiratory failure from AECOPD, PNA. - BDs, solumedrol - f/u CXR - oxygen to keep SpO2 > 92%  A fib with RVR. Acute on chronic diastolic CHF. - cardizem  gtt - continue eliquis - f/u Echo  DM type II with steroid induced hyperglycemia. Hx of hypothyroidism. - SSI with lantus - continue synthroid  Acute delirium >> likely from steroids, acute illness, and being in hospital setting. - monitor mental status - prn xanax   Coralyn Helling, MD [facility] Pulmonary/Critical Care 09/07/2015, 12:14 PM Pager:  [phone] After 3pm call: [phone]

## 2015-09-08 ENCOUNTER — Inpatient Hospital Stay (HOSPITAL_COMMUNITY): Payer: Medicare Other

## 2015-09-08 DIAGNOSIS — Z7901 Long term (current) use of anticoagulants: Secondary | ICD-10-CM

## 2015-09-08 DIAGNOSIS — J189 Pneumonia, unspecified organism: Secondary | ICD-10-CM | POA: Diagnosis present

## 2015-09-08 DIAGNOSIS — E038 Other specified hypothyroidism: Secondary | ICD-10-CM | POA: Diagnosis present

## 2015-09-08 DIAGNOSIS — J9621 Acute and chronic respiratory failure with hypoxia: Secondary | ICD-10-CM | POA: Diagnosis present

## 2015-09-08 DIAGNOSIS — I5033 Acute on chronic diastolic (congestive) heart failure: Secondary | ICD-10-CM | POA: Diagnosis present

## 2015-09-08 DIAGNOSIS — I10 Essential (primary) hypertension: Secondary | ICD-10-CM

## 2015-09-08 DIAGNOSIS — R0600 Dyspnea, unspecified: Secondary | ICD-10-CM

## 2015-09-08 LAB — BASIC METABOLIC PANEL
ANION GAP: 7 (ref 5–15)
BUN: 17 mg/dL (ref 6–20)
CALCIUM: 8.8 mg/dL — AB (ref 8.9–10.3)
CO2: 34 mmol/L — AB (ref 22–32)
Chloride: 96 mmol/L — ABNORMAL LOW (ref 101–111)
Creatinine, Ser: 0.69 mg/dL (ref 0.44–1.00)
GFR calc Af Amer: >60 mL/min (ref >60)
GFR calc non Af Amer: >60 mL/min (ref >60)
GLUCOSE: 170 mg/dL — AB (ref 65–99)
Potassium: 3.8 mmol/L (ref 3.5–5.1)
Sodium: 137 mmol/L (ref 135–145)

## 2015-09-08 LAB — CBC
HCT: 45.2 % (ref 36.0–46.0)
Hemoglobin: 13.7 g/dL (ref 12.0–15.0)
MCH: 30 pg (ref 26.0–34.0)
MCHC: 30.3 g/dL (ref 30.0–36.0)
MCV: 99.1 fL (ref 78.0–100.0)
PLATELETS: 240 10*3/uL (ref 150–400)
RBC: 4.56 MIL/uL (ref 3.87–5.11)
RDW: 14.3 % (ref 11.5–15.5)
WBC: 9.2 10*3/uL (ref 4.0–10.5)

## 2015-09-08 LAB — ECHOCARDIOGRAM COMPLETE
HEIGHTINCHES: 62 in
WEIGHTICAEL: 2490.32 [oz_av]

## 2015-09-08 LAB — GLUCOSE, CAPILLARY
GLUCOSE-CAPILLARY: 183 mg/dL — AB (ref 65–99)
GLUCOSE-CAPILLARY: 196 mg/dL — AB (ref 65–99)
GLUCOSE-CAPILLARY: 230 mg/dL — AB (ref 65–99)
GLUCOSE-CAPILLARY: 279 mg/dL — AB (ref 65–99)
GLUCOSE-CAPILLARY: 315 mg/dL — AB (ref 65–99)
Glucose-Capillary: 162 mg/dL — ABNORMAL HIGH (ref 65–99)
Glucose-Capillary: 190 mg/dL — ABNORMAL HIGH (ref 65–99)

## 2015-09-08 MED ORDER — INSULIN GLARGINE 100 UNIT/ML ~~LOC~~ SOLN
8.0000 [IU] | Freq: Every day | SUBCUTANEOUS | Status: DC
  Administered 2015-09-08: 8 [IU] via SUBCUTANEOUS
  Filled 2015-09-08 (×2): qty 0.08

## 2015-09-08 MED ORDER — IPRATROPIUM-ALBUTEROL 0.5-2.5 (3) MG/3ML IN SOLN
3.0000 mL | Freq: Four times a day (QID) | RESPIRATORY_TRACT | Status: DC
  Administered 2015-09-08 – 2015-09-09 (×2): 3 mL via RESPIRATORY_TRACT
  Filled 2015-09-08 (×2): qty 3

## 2015-09-08 MED ORDER — METHYLPREDNISOLONE SODIUM SUCC 125 MG IJ SOLR
60.0000 mg | Freq: Two times a day (BID) | INTRAMUSCULAR | Status: DC
  Administered 2015-09-09 – 2015-09-10 (×3): 60 mg via INTRAVENOUS
  Filled 2015-09-08 (×3): qty 2

## 2015-09-08 NOTE — Progress Notes (Signed)
Inpatient Diabetes Program Recommendations  AACE/ADA: New Consensus Statement on Inpatient Glycemic Control (2015)  Target Ranges:  Prepandial:   less than 140 mg/dL      Peak postprandial:   less than 180 mg/dL (1-2 hours)      Critically ill patients:  140 - 180 mg/dL   Review of Glycemic Control Results for [name] [name], [name] (MRN [id number]) as of 09/08/2015 10:48  Ref. Range 09/07/2015 15:39 09/07/2015 20:23 09/08/2015 00:57 09/08/2015 04:27 09/08/2015 07:50  Glucose-Capillary Latest Ref Range: 65-99 mg/dL 191214 (H) 478156 (H) 295183 (H) 196 (H) 162 (H)   Diabetes history: Type 2 diabetes Outpatient Diabetes medications: Glucotrol 10 mg bid Current orders for Inpatient glycemic control:  Novolog resistant q 4 hours  Inpatient Diabetes Program Recommendations:    Please consider reducing Novolog correction to moderate tid with meals.  Will follow.  Thanks, Beryl MeagerJenny Johnel Yielding, RN, BC-ADM Inpatient Diabetes Coordinator Pager [phone] (8a-5p)

## 2015-09-08 NOTE — Progress Notes (Signed)
Echocardiogram 2D Echocardiogram has been performed.  Nolon RodBrown, Tony 09/08/2015, 4:45 PM

## 2015-09-08 NOTE — Progress Notes (Signed)
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 Afebrile and feeling more energetic. On PO rate control meds now.  CURRENT MEDS . antiseptic oral rinse  7 mL Mouth Rinse BID  . apixaban  5 mg Oral BID  . azithromycin  500 mg Intravenous Q24H  . cefTRIAXone (ROCEPHIN)  IV  1 g Intravenous Q24H  . diltiazem  240 mg Oral Daily  . insulin aspart  0-20 Units Subcutaneous Q4H  . ipratropium-albuterol  3 mL Nebulization Q6H  . levothyroxine  88 mcg Oral QAC breakfast  . methylPREDNISolone (SOLU-MEDROL) injection  60 mg Intravenous Q8H  . metoprolol tartrate  25 mg Oral Daily  . rosuvastatin  10 mg Oral Daily    OBJECTIVE   Intake/Output Summary (Last 24 hours) at 09/08/15 0752 Last data filed at 09/08/15 0657  Gross per 24 hour  Intake 1187.5 ml  Output   1100 ml  Net   87.5 ml   Filed Weights   09/07/15 1118 09/08/15 0500  Weight: 70.5 kg (155 lb 6.8 oz) 70.6 kg (155 lb 10.3 oz)    PHYSICAL EXAM Filed Vitals:   09/08/15 0400 09/08/15 0500 09/08/15 0600 09/08/15 0751  BP: 119/71 101/47 102/60   Pulse: 89 92 83   Temp: 97.9 F (36.6 C)   98 F (36.7 C)  TempSrc: Oral   Oral  Resp: Height:      Weight:  70.6 kg (155 lb 10.3 oz)    SpO2: 98% 99% 100%    General: Alert, oriented x3, no distress Head: no evidence of trauma, PERRL, EOMI, no exophtalmos or lid lag, no myxedema, no xanthelasma; normal ears, nose and oropharynx Neck: normal jugular venous pulsations and no hepatojugular  reflux; brisk carotid pulses without delay and no carotid bruits Chest: reduced breath sounds right base, a few wheezes bilaterally Cardiovascular: normal position and quality of the apical impulse, irregular rhythm, normal first and second heart sounds, no rubs or gallops, no murmur Abdomen: no tenderness or distention, no masses by palpation, no abnormal pulsatility or arterial bruits, normal bowel sounds, no hepatosplenomegaly Extremities: no clubbing, cyanosis or edema; 2+ radial, ulnar and brachial pulses bilaterally; 2+ right femoral, posterior tibial and dorsalis pedis pulses; 2+ left femoral, posterior tibial and dorsalis pedis pulses; no subclavian or femoral bruits Neurological: grossly nonfocal  LABS  CBC  Recent Labs  09/07/15 1228 09/08/15 0404  WBC 13.9* 9.2  HGB 13.9 13.7  HCT 45.7 45.2  MCV 99.1 99.1  PLT 249 240   Basic Metabolic Panel  Recent Labs  09/07/15 1228 09/08/15 0635  NA 136 137  K 3.2* 3.8  CL 95* 96*  CO2 33* 34*  GLUCOSE 262* 170*  BUN 21* 17  CREATININE 0.98 0.69  CALCIUM 8.9 8.8*  MG 1.8  --   PHOS 3.5  --     Radiology Studies Imaging results have been reviewed and Dg Chest Port 1 View  09/07/2015  CLINICAL DATA:  Cough and shortness of Breath EXAM: PORTABLE  CHEST 1 VIEW COMPARISON:  09/03/2015 FINDINGS: Cardiac shadow remains enlarged. The left lung remains clear. Patchy changes are again noted in the right lung base stable from the previous exam. No new focal abnormality is noted. IMPRESSION: No change in right basilar infiltrates. Electronically Signed   By: Alcide CleverMark  Lukens M.D.   On: 09/07/2015 13:43    TELE AFib with VR 80s  ECHO pending  ASSESSMENT AND PLAN  1. Chronic diastolic HF - echo pending, clinically euvolemic 2. Permanent atrial fibrillation - rate controlled on oral meds, on chronic anticoagulation 3. RLL pneumonia - no cough, no fever, WBC improving; on 3.5L O2 (she uses 2-3 L at home) 4. HTN with fair BP control.  Will restart lisinopril and HCTZ tomorrow or as an outpt, depending on BP over next 24 hours.  Ready for telemetry. May soon be ready for DC. Walks with walker at home, will ask for PT eval.   Thurmon FairMihai Kaytie Ratcliffe, MD, [facility] [phone] office [phone] pager 09/08/2015 7:52 AM

## 2015-09-08 NOTE — Progress Notes (Signed)
PROGRESS NOTE    [name] [name]  VOH:[id number] DOB: 05-Jan-1941 DOA: 09/07/2015 PCP: Ignatius Specking, MD   Brief Narrative:  70SW NI PMHx HTN, CAD, Proximal Atrial Fibrillation, COPD on 2- 3 L O2, 24 hour/dy, Chronic Diastolic CHF/Cardiomyopathy, CAD native artery,Sinus bradycardia, DM type II, Hypothyroidism  initially admitted 7/6 to [facility] with with progressive SOB, weakness and wheezing, cough. She was treated for CAP, AECOPD and acute on chronic diastolic heart failure. She was treated with levaquin and IV steroids. Course was c/b AFib with RVR requiring cardizem gtt as well as delirium. Pt and family requested tx to [facility] 7/10.   Currently feeling some better. Still some SOB but overall improved. Denies chest pain, hemoptysis, purulent sputum, lightheadedness, chills.    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)   Acute on chronic respiratory failure with hypoxia (HCC)   CAP (community acquired pneumonia)   Acute on chronic diastolic CHF (congestive heart failure) (HCC)   Other specified hypothyroidism    Acute on chronic respiratory failure with hypoxia/CAP  -Resolving -Complete five-day course antibiotics -Solu-Medrol 60 mg BID -Flutter valve -DuoNeb QID   Acute on chronic Diastolic CHF  -Strict in and out since admission +793ml -Daily weight Filed Weights   09/07/15 1118 09/08/15 0500  Weight: 70.5 kg (155 lb 6.8 oz) 70.6 kg (155 lb 10.3 oz)  -Ambulate patient  Afib with RVR  -Eliquis 5 mg  BID -Diltiazem 240 mg daily  -Metoprolol 25 mg daily  -2D echo pending   DM type II  -A1c  pending  -Lipid panel pending -Resistant SSI -Lantus 8 units daily  Hypothyroid  -TSH pending  -Synthroid 88 g daily    Reported ICU delirium at outside hospital  -Resolved  DVT prophylaxis: Eliquis Code Status: Full Family Communication:  Disposition Plan: Resolution acute on chronic respiratory failure   Consultants:  Dr.Mihai Croitoru cardiology Dr.Vineet [facility]   Procedures/Significant Events:  Echocardiogram pending  Cultures NA  Antimicrobials: Levaquin 7/6?>> 7/10? Started at [facility] hospital Azithromycin 7/10 l>> Ceftriaxone 7/10 l>>   Devices NA   LINES / TUBES:      Continuous Infusions:    Subjective: 7/11 A/O 4, acute on chronic respiratory failure   Objective: Filed Vitals:   09/08/15 1935 09/08/15 1957 09/08/15 2000 09/08/15 2100  BP:  126/70 98/75 114/69  Pulse:  108 118 78  Temp:  98 F (36.7 C)    TempSrc:  Oral    Resp:  Height:      Weight:      SpO2: 96% 95% 92% 93%    Intake/Output Summary (Last 24 hours) at 09/08/15 2141 Last data filed at 09/08/15 2100  Gross per 24 hour  Intake    760 ml  Output    450 ml  Net    310 ml   Filed Weights   09/07/15 1118 09/08/15 0500  Weight: 70.5 kg (155 lb 6.8 oz) 70.6 kg (155 lb 10.3 oz)    Examination:  General:A/O 4, acute on chronic respiratory failure Eyes: negative scleral hemorrhage, negative anisocoria, negative icterus ENT: Negative Runny nose, negative gingival  bleeding, Neck:  Negative scars, masses, torticollis, lymphadenopathy, JVD Lungs: diffuse expiratory wheezing, negative crackles Cardiovascular: Regular rate and rhythm without murmur gallop or rub normal S1 and S2 Abdomen: negative abdominal pain, nondistended, positive soft, bowel sounds, no rebound, no ascites, no appreciable mass Extremities: No significant cyanosis, clubbing, or edema bilateral lower extremities Skin: Negative rashes, lesions, ulcers Psychiatric:  Negative  depression, negative anxiety, negative fatigue, negative mania  Central nervous system:  Cranial nerves II through XII intact, tongue/uvula midline, all extremities muscle strength 5/5, sensation intact throughout,  negative dysarthria, negative expressive aphasia, negative receptive aphasia.  .     Data Reviewed: Care during the described time interval was provided by me .  I have reviewed this patient's available data, including medical history, events of note, physical examination, and all test results as part of my evaluation. I have personally reviewed and interpreted all radiology studies.  CBC:  Recent Labs Lab 09/07/15 1228 09/08/15 0404  WBC 13.9* 9.2  HGB 13.9 13.7  HCT 45.7 45.2  MCV 99.1 99.1  PLT 249 240   Basic Metabolic Panel:  Recent Labs Lab 09/07/15 1228 09/08/15 0635  NA 136 137  K 3.2* 3.8  CL 95* 96*  CO2 33* 34*  GLUCOSE 262* 170*  BUN 21* 17  CREATININE 0.98 0.69  CALCIUM 8.9 8.8*  MG 1.8  --   PHOS 3.5  --    GFR: Estimated Creatinine Clearance: 56.8 mL/min (by C-G formula based on Cr of 0.69). Liver Function Tests: No results for input(s): AST, ALT, ALKPHOS, BILITOT, PROT, ALBUMIN in the last 168 hours. No results for input(s): LIPASE, AMYLASE in the last 168 hours. No results for input(s): AMMONIA in the last 168 hours. Coagulation Profile: No results for input(s): INR, PROTIME in the last 168 hours. Cardiac Enzymes: No results for input(s): CKTOTAL, CKMB, CKMBINDEX, TROPONINI in the last 168 hours. BNP (last 3 results) No results for input(s): PROBNP in the last 8760 hours. HbA1C: No results for input(s): HGBA1C in the last 72 hours. CBG:  Recent Labs Lab 09/08/15 0427 09/08/15 0750 09/08/15 1140 09/08/15 1523 09/08/15 1956  GLUCAP 196* 162* 279* 315* 230*   Lipid Profile: No results for input(s): CHOL, HDL, LDLCALC, TRIG, CHOLHDL, LDLDIRECT in the last 72 hours. Thyroid Function Tests: No results for input(s): TSH, T4TOTAL,  FREET4, T3FREE, THYROIDAB in the last 72 hours. Anemia Panel: No results for input(s): VITAMINB12, FOLATE, FERRITIN, TIBC, IRON, RETICCTPCT in the last 72 hours. Urine analysis: No results found for: COLORURINE, APPEARANCEUR, LABSPEC, PHURINE, GLUCOSEU, HGBUR, BILIRUBINUR, KETONESUR, PROTEINUR, UROBILINOGEN, NITRITE, LEUKOCYTESUR Sepsis Labs: @LABRCNTIP (procalcitonin:4,lacticidven:4)  ) Recent Results (from the past 240 hour(s))  MRSA PCR Screening     Status: None   Collection Time: 09/07/15 11:39 AM  Result Value Ref Range Status   MRSA by PCR NEGATIVE NEGATIVE Final    Comment:        The GeneXpert MRSA Assay (FDA approved for NASAL specimens only), is one component of a comprehensive MRSA colonization surveillance program. It is not intended to diagnose MRSA infection nor to guide or monitor treatment for MRSA infections.          Radiology Studies: Dg Chest Port 1 View  09/07/2015  CLINICAL DATA:  Cough and shortness of Breath EXAM: PORTABLE CHEST 1 VIEW COMPARISON:  09/03/2015 FINDINGS: Cardiac shadow remains enlarged. The left lung remains clear. Patchy changes are again noted in the right lung base stable from the previous exam. No new focal abnormality is noted.  IMPRESSION: No change in right basilar infiltrates. Electronically Signed   By: Alcide Clever M.D.   On: 09/07/2015 13:43        Scheduled Meds: . antiseptic oral rinse  7 mL Mouth Rinse BID  . apixaban  5 mg Oral BID  . azithromycin  500 mg Intravenous Q24H  . cefTRIAXone (ROCEPHIN)  IV  1 g Intravenous Q24H  . diltiazem  240 mg Oral Daily  . insulin aspart  0-20 Units Subcutaneous Q4H  . insulin glargine  8 Units Subcutaneous QHS  . ipratropium-albuterol  3 mL Nebulization Q6H WA  . levothyroxine  88 mcg Oral QAC breakfast  . [START ON 09/09/2015] methylPREDNISolone (SOLU-MEDROL) injection  60 mg Intravenous Q12H  . metoprolol tartrate  25 mg Oral Daily  . rosuvastatin  10 mg Oral Daily    Continuous Infusions:    LOS: 1 day    Time spent: 40 minutes    WOODS, Roselind Messier, MD [facility] Pager [phone]   If 7PM-7AM, please contact night-coverage [email or web address] Password [facility] 09/08/2015, 9:41 PM

## 2015-09-09 LAB — COMPREHENSIVE METABOLIC PANEL
ALBUMIN: 3.3 g/dL — AB (ref 3.5–5.0)
ALK PHOS: 40 U/L (ref 38–126)
ALT: 26 U/L (ref 14–54)
AST: 20 U/L (ref 15–41)
Anion gap: 9 (ref 5–15)
BILIRUBIN TOTAL: 0.8 mg/dL (ref 0.3–1.2)
BUN: 17 mg/dL (ref 6–20)
CO2: 33 mmol/L — ABNORMAL HIGH (ref 22–32)
CREATININE: 0.87 mg/dL (ref 0.44–1.00)
Calcium: 8.9 mg/dL (ref 8.9–10.3)
Chloride: 95 mmol/L — ABNORMAL LOW (ref 101–111)
GFR calc Af Amer: >60 mL/min (ref >60)
GLUCOSE: 204 mg/dL — AB (ref 65–99)
POTASSIUM: 4 mmol/L (ref 3.5–5.1)
Sodium: 137 mmol/L (ref 135–145)
TOTAL PROTEIN: 6 g/dL — AB (ref 6.5–8.1)

## 2015-09-09 LAB — CBC WITH DIFFERENTIAL/PLATELET
BASOS PCT: 0 %
Basophils Absolute: 0 10*3/uL (ref 0.0–0.1)
Eosinophils Absolute: 0 10*3/uL (ref 0.0–0.7)
Eosinophils Relative: 0 %
HEMATOCRIT: 43.7 % (ref 36.0–46.0)
HEMOGLOBIN: 13.4 g/dL (ref 12.0–15.0)
LYMPHS ABS: 0.6 10*3/uL — AB (ref 0.7–4.0)
LYMPHS PCT: 6 %
MCH: 30 pg (ref 26.0–34.0)
MCHC: 30.7 g/dL (ref 30.0–36.0)
MCV: 97.8 fL (ref 78.0–100.0)
Monocytes Absolute: 0.2 10*3/uL (ref 0.1–1.0)
Monocytes Relative: 2 %
Neutro Abs: 8.8 10*3/uL — ABNORMAL HIGH (ref 1.7–7.7)
Neutrophils Relative %: 92 %
Platelets: 226 10*3/uL (ref 150–400)
RBC: 4.47 MIL/uL (ref 3.87–5.11)
RDW: 14.3 % (ref 11.5–15.5)
WBC: 9.6 10*3/uL (ref 4.0–10.5)

## 2015-09-09 LAB — LIPID PANEL
CHOLESTEROL: 117 mg/dL (ref 0–200)
HDL: 62 mg/dL (ref >40)
LDL Cholesterol: 45 mg/dL (ref 0–99)
TRIGLYCERIDES: 52 mg/dL (ref <150)
Total CHOL/HDL Ratio: 1.9 RATIO
VLDL: 10 mg/dL (ref 0–40)

## 2015-09-09 LAB — HEMOGLOBIN A1C
HEMOGLOBIN A1C: 8.4 % — AB (ref 4.8–5.6)
MEAN PLASMA GLUCOSE: 194 mg/dL

## 2015-09-09 LAB — TSH: TSH: 0.984 u[IU]/mL (ref 0.350–4.500)

## 2015-09-09 LAB — GLUCOSE, CAPILLARY
GLUCOSE-CAPILLARY: 200 mg/dL — AB (ref 65–99)
GLUCOSE-CAPILLARY: 381 mg/dL — AB (ref 65–99)
GLUCOSE-CAPILLARY: 188 mg/dL — AB (ref 65–99)
Glucose-Capillary: 200 mg/dL — ABNORMAL HIGH (ref 65–99)
Glucose-Capillary: 424 mg/dL — ABNORMAL HIGH (ref 65–99)
Glucose-Capillary: 332 mg/dL — ABNORMAL HIGH (ref 65–99)

## 2015-09-09 LAB — MAGNESIUM: Magnesium: 2 mg/dL (ref 1.7–2.4)

## 2015-09-09 MED ORDER — INSULIN ASPART 100 UNIT/ML ~~LOC~~ SOLN
0.0000 [IU] | Freq: Every day | SUBCUTANEOUS | Status: DC
  Administered 2015-09-10: 3 [IU] via SUBCUTANEOUS

## 2015-09-09 MED ORDER — INSULIN ASPART 100 UNIT/ML ~~LOC~~ SOLN
0.0000 [IU] | Freq: Three times a day (TID) | SUBCUTANEOUS | Status: DC
  Administered 2015-09-09 – 2015-09-10 (×2): 15 [IU] via SUBCUTANEOUS
  Administered 2015-09-10: 3 [IU] via SUBCUTANEOUS
  Administered 2015-09-10: 11 [IU] via SUBCUTANEOUS
  Administered 2015-09-11: 4 [IU] via SUBCUTANEOUS
  Administered 2015-09-11: 3 [IU] via SUBCUTANEOUS

## 2015-09-09 MED ORDER — LEVALBUTEROL HCL 0.63 MG/3ML IN NEBU: 0.6300 mg | INHALATION_SOLUTION | RESPIRATORY_TRACT | Status: DC | PRN

## 2015-09-09 MED ORDER — DILTIAZEM HCL 60 MG PO TABS
60.0000 mg | ORAL_TABLET | Freq: Once | ORAL | Status: AC
  Administered 2015-09-09: 60 mg via ORAL
  Filled 2015-09-09: qty 1

## 2015-09-09 MED ORDER — INSULIN ASPART 100 UNIT/ML ~~LOC~~ SOLN
3.0000 [IU] | Freq: Three times a day (TID) | SUBCUTANEOUS | Status: DC
  Administered 2015-09-09: 3 [IU] via SUBCUTANEOUS

## 2015-09-09 MED ORDER — IPRATROPIUM BROMIDE 0.02 % IN SOLN
0.5000 mg | Freq: Four times a day (QID) | RESPIRATORY_TRACT | Status: DC
  Administered 2015-09-09 – 2015-09-10 (×4): 0.5 mg via RESPIRATORY_TRACT
  Filled 2015-09-09 (×4): qty 2.5

## 2015-09-09 MED ORDER — LEVALBUTEROL HCL 0.63 MG/3ML IN NEBU
0.6300 mg | INHALATION_SOLUTION | Freq: Four times a day (QID) | RESPIRATORY_TRACT | Status: DC
  Administered 2015-09-09 – 2015-09-10 (×4): 0.63 mg via RESPIRATORY_TRACT
  Filled 2015-09-09 (×4): qty 3

## 2015-09-09 MED ORDER — INSULIN GLARGINE 100 UNIT/ML ~~LOC~~ SOLN
14.0000 [IU] | Freq: Every day | SUBCUTANEOUS | Status: DC
  Administered 2015-09-09: 14 [IU] via SUBCUTANEOUS
  Filled 2015-09-09 (×2): qty 0.14

## 2015-09-09 NOTE — Progress Notes (Signed)
   Subjective: Pt states she is feeling better today. She feels like her SOB and weakness have improved since admission. Yesterday she walked around the unit one time. She did not experience dyspnea but felt weak during the walk. She denies cough, wheeze, fever, chest pain, palpitations, or edema.   Objective: Vital signs in last 24 hours: Filed Vitals:   09/09/15 0600 09/09/15 0700 09/09/15 0800 09/09/15 0909  BP: 107/58 117/62    Pulse: 76 34 118   Temp:  97.9 F (36.6 C)    TempSrc:  Oral    Resp: 21 23 26    Height:      Weight:      SpO2: 97% 99% 94% 96%   Physical Exam Physical Exam  Constitutional: She is well-developed, well-nourished, and in no distress.  Neck: No JVD present. No tracheal deviation present.  Cardiovascular: Normal rate, regular rhythm, normal heart sounds and intact distal pulses.   No murmur heard. Pulmonary/Chest: Effort normal and breath sounds normal. No respiratory distress. She has no wheezes. She has no rales.  Abdominal: Soft. Bowel sounds are normal. She exhibits no distension. There is no tenderness.  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 BNP 297  - continue strict I/Os   - continue metoprolol   Chronic HTN BP has been low normal   - continue to monitor   HLD lipid panel is normal   - continue crestor     Pneumonia afebrile, no cough, normal WBC count   - continue abx, Nebs, solumedrol, and O2 Nasal canula    DM   - continue ISS   FEN cardiac diet  DVT PPx on eliquis   LOS: 2 days   Eulah PontNina Blum, MD 09/09/2015, 10:28 AM Pager: [phone]  I have seen and examined the patient along with Eulah PontNina Blum, MD.  I have reviewed the chart, notes and new data.  I agree with her note.  Key new complaints: rather anxious, restless Key examination changes: AF with RVR, no overt hypervolemia Key new findings / data: echo shows preserved LVEF and no serious valvular abnormalities  PLAN: Will give additional immediate release diltiazem today. Suspect RVR related to anxiety and inhaled bronchodilators. Wll reevaluate later today to see if maintenance dose needs to be changed  Thurmon FairMihai Lataisha Colan, MD, [facility] [phone] 09/09/2015, 11:32 AM

## 2015-09-09 NOTE — Progress Notes (Signed)
Patient's 1130 CBG 424, re-check at 1215 CBG 381.  20 units given per SSI-MD notified and will continue to monitor. [place], Mitzi HansenJessica Marie

## 2015-09-09 NOTE — Care Management Important Message (Signed)
Important Message  Patient Details  Name: [name] [name] MRN: [id number] Date of Birth: 08/21/1940   Medicare Important Message Given:  Yes    [name] [name], [name] [name] 09/09/2015, 8:40 AM

## 2015-09-09 NOTE — Progress Notes (Signed)
[place] TEAM 1 - Stepdown/ICU TEAM  [name] [name]  WGN:[id number] DOB: [birth year] DOA: 09/07/2015 PCP: Ignatius Specking, MD    Brief Narrative:  75yo F Hx HTN, CAD, Parox Atrial Fibrillation, COPD on 2- 3 L O2, Chronic Diastolic CHF/Cardiomyopathy, CAD,Sinus bradycardia, DM2, and Hypothyroidism who was initially admitted to [facility] 09/03/15 with with progressive SOB, weakness and wheezing. She was treated for CAP, AECOPD, and acute on chronic diastolic heart failure with levaquin and IV steroids. Course was c/b AFib with RVR requiring cardizem gtt as well as delirium. Pt and family requested tx to [facility] 7/10.   Subjective: The pt states she is feeling much better.  She denies cp, n/v, or abdom pain.  She is not sob at rest.  She is anxious to get up and move more.  This morning her HR has been somewhat labile, w/ episodes of tachycardia.    Assessment & Plan:  Acute on chronic respiratory failure with hypoxia - R basilar Pneumonia   -O2 requirements back to her baseline  -to complete five-day course antibiotic tx  -cont nebs and flutter valve - wean steroids  Acute on chronic Diastolic CHF  -no clinical evidence of signif volume overload at this time  [facility] Weights   09/07/15 1118 09/08/15 0500 09/09/15 0314  Weight: 70.5 kg (155 lb 6.8 oz) 70.6 kg (155 lb 10.3 oz) 70.534 kg (155 lb 8 oz)    Chronic Afib with RVR  -Cardiology adjusting medical tx w/ episodes of RVR today - keep on tele in SDU in case CCB gtt required   DM2 - uncontrolled  -A1c pending - very poorly controlled in setting of steroid use - adjust tx and follow   Hypothyroid  -TSH at goal - Synthroid 88 g daily   Reported ICU delirium at outside hospital  -Resolved - alert and oriented   DVT prophylaxis: Eliquis  Code Status: FULL CODE Family Communication: no family present at time of exam  Disposition Plan:   Consultants:  [facility] PCCM  Procedures: TTE 7/11 - EF 60-65% - no  WMA  Antimicrobials:  Azithromycin 7/10 > Ceftriaxone 7/10 >  Objective: Blood pressure 121/72, pulse 78, temperature 97.6 F (36.4 C), temperature source Oral, resp. rate 24, height  (1.575 m), weight 70.534 kg (155 lb 8 oz), SpO2 98 %.  Intake/Output Summary (Last 24 hours) at 09/09/15 1404 Last data filed at 09/09/15 1315  Gross per 24 hour  Intake    968 ml  Output    675 ml  Net    293 ml   Filed Weights   09/07/15 1118 09/08/15 0500 09/09/15 0314  Weight: 70.5 kg (155 lb 6.8 oz) 70.6 kg (155 lb 10.3 oz) 70.534 kg (155 lb 8 oz)    Examination: General: No acute respiratory distress at rest  Lungs: R basilar crackles - no wheeze - good air movement th/o other fields  Cardiovascular: Irreg irreg - no gallup or rub - rate 80bpm Abdomen: Nontender, nondistended, soft, bowel sounds positive, no rebound, no ascites, no appreciable mass Extremities: No significant cyanosis, clubbing, or edema bilateral lower extremities  CBC:  Recent Labs Lab 09/07/15 1228 09/08/15 0404 09/09/15 0306  WBC 13.9* 9.2 9.6  NEUTROABS  --   --  8.8*  HGB 13.9 13.7 13.4  HCT 45.7 45.2 43.7  MCV 99.1 99.1 97.8  PLT 249 240 226   Basic Metabolic Panel:  Recent Labs Lab 09/07/15 1228 09/08/15 0635 09/09/15 0306  NA  136 137 137  K 3.2* 3.8 4.0  CL 95* 96* 95*  CO2 33* 34* 33*  GLUCOSE 262* 170* 204*  BUN 21* 17 17  CREATININE 0.98 0.69 0.87  CALCIUM 8.9 8.8* 8.9  MG 1.8  --  2.0  PHOS 3.5  --   --    GFR: Estimated Creatinine Clearance: 52.2 mL/min (by C-G formula based on Cr of 0.87).  Liver Function Tests:  Recent Labs Lab 09/09/15 0306  AST 20  ALT 26  ALKPHOS 40  BILITOT 0.8  PROT 6.0*  ALBUMIN 3.3*    CBG:  Recent Labs Lab 09/08/15 2313 09/09/15 0316 09/09/15 0735 09/09/15 1132 09/09/15 1216  GLUCAP 190* 200* 200* 424* 381*    Recent Results (from the past 240 hour(s))  MRSA PCR Screening     Status: None   Collection Time: 09/07/15 11:39 AM    Result Value Ref Range Status   MRSA by PCR NEGATIVE NEGATIVE Final    Comment:        The GeneXpert MRSA Assay (FDA approved for NASAL specimens only), is one component of a comprehensive MRSA colonization surveillance program. It is not intended to diagnose MRSA infection nor to guide or monitor treatment for MRSA infections.      Scheduled Meds: . antiseptic oral rinse  7 mL Mouth Rinse BID  . apixaban  5 mg Oral BID  . azithromycin  500 mg Intravenous Q24H  . cefTRIAXone (ROCEPHIN)  IV  1 g Intravenous Q24H  . diltiazem  240 mg Oral Daily  . insulin aspart  0-20 Units Subcutaneous Q4H  . insulin glargine  8 Units Subcutaneous QHS  . ipratropium-albuterol  3 mL Nebulization Q6H WA  . levothyroxine  88 mcg Oral QAC breakfast  . methylPREDNISolone (SOLU-MEDROL) injection  60 mg Intravenous Q12H  . metoprolol tartrate  25 mg Oral Daily  . rosuvastatin  10 mg Oral Daily    LOS: 2 days   Time spent: 35 minutes   Lonia BloodJeffrey T. McClung, MD [facility] Office  [phone] Pager - Text Page per Amion as per below:  On-Call/Text Page:      [email or web address]      password [email or web address]  If 7PM-7AM, please contact night-coverage [email or web address] Password [email or web address] 09/09/2015, 2:04 PM

## 2015-09-09 NOTE — Progress Notes (Signed)
Diabetes history: Type 2 diabetes Outpatient Diabetes medications: Glucotrol 10 mg bid Current orders for Inpatient glycemic control:  Novolog resistant q 4 hours  Inpatient Diabetes Program Recommendations:    Please consider reducing Novolog correction to moderate tid with meals. Will follow. Inpatient Diabetes Program Recommendations  AACE/ADA: New Consensus Statement on Inpatient Glycemic Control (2015)  Target Ranges:  Prepandial:   less than 140 mg/dL      Peak postprandial:   less than 180 mg/dL (1-2 hours)      Critically ill patients:  140 - 180 mg/dL    Review of Glycemic Control:  Results for [name], [name] O (MRN [id number]) as of 09/09/2015 12:14  Ref. Range 09/09/2015 03:16 09/09/2015 07:35 09/09/2015 11:32  Glucose-Capillary Latest Ref Range: 65-99 mg/dL 409200 (H) 811200 (H) 914424 (H)   Diabetes history: Type 2 diabetes Outpatient Diabetes medications: Glucotrol 10 mg bid Current orders for Inpatient glycemic control:  Lantus 8 units q HS, Novolog resistant q 4 hours  Inpatient Diabetes Program Recommendations:    Note that patient is eating.  Please consider reducing Novolog correction to tid with meals.  Also may need to add Novolog meal coverage 3 units tid with meals (hold if patient eats less than 50%).  Thanks, Beryl MeagerJenny Sola Margolis, RN, BC-ADM Inpatient Diabetes Coordinator Pager [phone] (8a-5p)

## 2015-09-09 NOTE — Progress Notes (Signed)
Patient's HR sustaining in 120-150's, after PO Diltiazem and metoprolol given.  Patient asymptomatic.  Cards MD notified and new orders received.  Attending MD notified and patient now to transfer to SDU.  Will continue to monitor. [name] [name], [name] [name]

## 2015-09-09 NOTE — Progress Notes (Signed)
PT Cancellation Note  Patient Details Name: [name] [name] MRN: [id number] DOB: 06/14/1940   Cancelled Treatment:    Reason Eval/Treat Not Completed: Medical issues which prohibited therapy (HR fluctuating 133-176 at rest ).  PT will continue to follow acutely.  Encarnacion ChuAshley Marquesha Robideau PT, DPT  Pager: [phone] Phone: [phone] 09/09/2015, 9:20 AM

## 2015-09-10 DIAGNOSIS — E118 Type 2 diabetes mellitus with unspecified complications: Secondary | ICD-10-CM

## 2015-09-10 DIAGNOSIS — E1165 Type 2 diabetes mellitus with hyperglycemia: Secondary | ICD-10-CM

## 2015-09-10 DIAGNOSIS — Z794 Long term (current) use of insulin: Secondary | ICD-10-CM

## 2015-09-10 DIAGNOSIS — IMO0002 Reserved for concepts with insufficient information to code with codable children: Secondary | ICD-10-CM | POA: Diagnosis present

## 2015-09-10 LAB — GLUCOSE, CAPILLARY
GLUCOSE-CAPILLARY: 253 mg/dL — AB (ref 65–99)
GLUCOSE-CAPILLARY: 324 mg/dL — AB (ref 65–99)
Glucose-Capillary: 281 mg/dL — ABNORMAL HIGH (ref 65–99)
Glucose-Capillary: 287 mg/dL — ABNORMAL HIGH (ref 65–99)

## 2015-09-10 MED ORDER — INSULIN ASPART 100 UNIT/ML ~~LOC~~ SOLN
8.0000 [IU] | Freq: Three times a day (TID) | SUBCUTANEOUS | Status: DC
  Administered 2015-09-10 – 2015-09-11 (×3): 8 [IU] via SUBCUTANEOUS

## 2015-09-10 MED ORDER — METHYLPREDNISOLONE SODIUM SUCC 125 MG IJ SOLR
60.0000 mg | Freq: Every day | INTRAMUSCULAR | Status: DC
  Administered 2015-09-11: 60 mg via INTRAVENOUS
  Filled 2015-09-10: qty 2

## 2015-09-10 MED ORDER — LEVALBUTEROL HCL 0.63 MG/3ML IN NEBU
0.6300 mg | INHALATION_SOLUTION | Freq: Three times a day (TID) | RESPIRATORY_TRACT | Status: DC
  Administered 2015-09-10 – 2015-09-11 (×2): 0.63 mg via RESPIRATORY_TRACT
  Filled 2015-09-10 (×3): qty 3

## 2015-09-10 MED ORDER — INSULIN GLARGINE 100 UNIT/ML ~~LOC~~ SOLN
20.0000 [IU] | Freq: Every day | SUBCUTANEOUS | Status: DC
  Administered 2015-09-10: 20 [IU] via SUBCUTANEOUS
  Filled 2015-09-10 (×3): qty 0.2

## 2015-09-10 MED ORDER — IPRATROPIUM BROMIDE 0.02 % IN SOLN
0.5000 mg | Freq: Three times a day (TID) | RESPIRATORY_TRACT | Status: DC
  Administered 2015-09-10 – 2015-09-11 (×2): 0.5 mg via RESPIRATORY_TRACT
  Filled 2015-09-10 (×3): qty 2.5

## 2015-09-10 MED ORDER — METOPROLOL TARTRATE 50 MG PO TABS
50.0000 mg | ORAL_TABLET | Freq: Two times a day (BID) | ORAL | Status: DC
  Administered 2015-09-10 – 2015-09-11 (×2): 50 mg via ORAL
  Filled 2015-09-10 (×2): qty 1

## 2015-09-10 MED ORDER — DILTIAZEM HCL ER COATED BEADS 180 MG PO CP24
360.0000 mg | ORAL_CAPSULE | Freq: Every day | ORAL | Status: DC
  Administered 2015-09-10 – 2015-09-11 (×2): 360 mg via ORAL
  Filled 2015-09-10 (×2): qty 2

## 2015-09-10 NOTE — Progress Notes (Signed)
PROGRESS NOTE    [name] [name]  AVW:[id number] DOB: [birth year] DOA: 09/07/2015 PCP: Ignatius Specking, MD   Brief Narrative:  83NF AO PMHx HTN, CAD, Proximal Atrial Fibrillation, COPD on 2- 3 L O2, 24 hour/dy, Chronic Diastolic CHF/Cardiomyopathy, CAD native artery,Sinus bradycardia, DM type II, Hypothyroidism  initially admitted 7/6 to [facility] with with progressive SOB, weakness and wheezing, cough. She was treated for CAP, AECOPD and acute on chronic diastolic heart failure. She was treated with levaquin and IV steroids. Course was c/b AFib with RVR requiring cardizem gtt as well as delirium. Pt and family requested tx to [facility] 7/10.   Currently feeling some better. Still some SOB but overall improved. Denies chest pain, hemoptysis, purulent sputum, lightheadedness, chills.    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)   Acute on chronic respiratory failure with hypoxia (HCC)   CAP (community acquired pneumonia)   Acute on chronic diastolic CHF (congestive heart failure) (HCC)   Other specified hypothyroidism   Uncontrolled type 2 diabetes mellitus with complication, with long-term current use of insulin (HCC)    Acute on chronic respiratory failure with hypoxia/CAP  -Resolving -Complete five-day course antibiotics -Decrease Solu-Medrol 60 mg daily -Flutter valve -DuoNeb QID -Perform ambulatory SPO2. Ambulate patient in hallway q shift  Acute on chronic Diastolic CHF  -Strict in and out since admission + 910 ml -Daily weight Filed Weights   09/08/15 0500 09/09/15 0314 09/10/15 0339  Weight: 70.6 kg (155  lb 10.3 oz) 70.534 kg (155 lb 8 oz) 70.625 kg (155 lb 11.2 oz)   Afib with RVR/Dilated Cardiomyopathy  -Eliquis 5 mg  BID -Diltiazem360 mg daily  -Metoprolol 25 mg daily  -2D echo normal EF, mild dilated cardiomyopathy  DM type II -7/12 Hemoglobin A1c= 8.4  -Lipid panel pending -Resistant SSI -Increase Lantus 20 units daily -Increase 8 units QAC  Hypothyroid  -7/12 TSH; normal pending  -Synthroid 88 g daily    Reported ICU delirium at outside hospital  -Resolved  DVT prophylaxis: Eliquis Code Status: Full Family Communication:  Disposition Plan: Resolution acute on chronic respiratory failure   Consultants:  Dr.Mihai Croitoru cardiology Dr.[facility] M   Procedures/Significant Events:  7/11 Echocardiogram: LVEF; 60% to 65%. --Left atrium:  mildly dilated.--- Right ventricle mildly dilated. --- Right atrium: mildly to moderately dilated.   Cultures NA  Antimicrobials: Levaquin 7/6?>> 7/10? Started at [facility] hospital Azithromycin 7/10 l>> Ceftriaxone 7/10 l>>   Devices NA   LINES / TUBES:      Continuous Infusions:    Subjective: 7/13 A/O 4, acute on chronic respiratory failure. Patient feels significantly improved requests to ambulate hallway     Objective: Filed Vitals:   09/10/15 0600 09/10/15 0800 09/10/15 0847 09/10/15 0851  BP: 112/52 110/57    Pulse: 90 100    Temp:  97.8 F (36.6 C)    TempSrc:  Oral    Resp: 15 22    Height:      Weight:      SpO2: 100% 97% 99% 100%    Intake/Output Summary (Last 24 hours) at 09/10/15 1212 Last data filed at 09/09/15 1900  Gross per 24 hour  Intake    608 ml  Output      0 ml  Net    608 ml   Filed Weights   09/08/15 0500 09/09/15 0314 09/10/15 0339  Weight: 70.6 kg (155 lb 10.3 oz) 70.534 kg (155 lb 8 oz) 70.625 kg (155 lb 11.2 oz)    Examination:  General:A/O 4, acute on chronic respiratory failure Eyes: negative scleral hemorrhage, negative anisocoria, negative  icterus ENT: Negative Runny nose, negative gingival bleeding, Neck:  Negative scars, masses, torticollis, lymphadenopathy, JVD Lungs: Clear to auscultation bilateral, negative wheezing, negative crackles Cardiovascular: Regular rate and rhythm without murmur gallop or rub normal S1 and S2 Abdomen: negative abdominal pain, nondistended, positive soft, bowel sounds, no rebound, no ascites, no appreciable mass Extremities: No significant cyanosis, clubbing, or edema bilateral lower extremities Skin: Negative rashes, lesions, ulcers Psychiatric:  Negative depression, negative anxiety, negative fatigue, negative mania  Central nervous system:  Cranial nerves II through XII intact, tongue/uvula midline, all extremities muscle strength 5/5, sensation intact throughout,  negative dysarthria, negative expressive aphasia, negative receptive aphasia.  .     Data Reviewed: Care during the described time interval was provided by me .  I have reviewed this patient's available data, including medical history, events of note, physical examination, and all test results as part of my evaluation. I have personally reviewed and interpreted all radiology studies.  CBC:  Recent Labs Lab 09/07/15 1228 09/08/15 0404 09/09/15 0306  WBC 13.9* 9.2 9.6  NEUTROABS  --   --  8.8*  HGB 13.9 13.7 13.4  HCT 45.7 45.2 43.7  MCV 99.1 99.1 97.8  PLT 249 240 226   Basic Metabolic Panel:  Recent Labs Lab 09/07/15 1228 09/08/15 0635 09/09/15 0306  NA 136 137 137  K 3.2* 3.8 4.0  CL 95* 96* 95*  CO2 33* 34* 33*  GLUCOSE 262* 170* 204*  BUN 21* 17 17  CREATININE 0.98 0.69 0.87  CALCIUM 8.9 8.8* 8.9  MG 1.8  --  2.0  PHOS 3.5  --   --    GFR: Estimated Creatinine Clearance: 52.2 mL/min (by C-G formula based on Cr of 0.87). Liver Function Tests:  Recent Labs Lab 09/09/15 0306  AST 20  ALT 26  ALKPHOS 40  BILITOT 0.8  PROT 6.0*  ALBUMIN 3.3*   No results for input(s): LIPASE, AMYLASE in the last  168 hours. No results for input(s): AMMONIA in the last 168 hours. Coagulation Profile: No results for input(s): INR, PROTIME in the last 168 hours. Cardiac Enzymes: No results for input(s): CKTOTAL, CKMB, CKMBINDEX, TROPONINI in the last 168 hours. BNP (last 3 results) No results for input(s): PROBNP in the last 8760 hours. HbA1C:  Recent Labs  09/09/15 0306  HGBA1C 8.4*   CBG:  Recent Labs Lab 09/09/15 1132 09/09/15 1216 09/09/15 1524 09/09/15 2107 09/10/15 0820  GLUCAP 424* 381* 332* 188* 253*   Lipid Profile:  Recent Labs  09/09/15 0306  CHOL 117  HDL 62  LDLCALC 45  TRIG 52  CHOLHDL 1.9   Thyroid Function Tests:  Recent Labs  09/09/15 0305  TSH 0.984   Anemia Panel: No results for input(s): VITAMINB12, FOLATE, FERRITIN, TIBC, IRON, RETICCTPCT in the last 72 hours. Urine analysis: No results found for: COLORURINE, APPEARANCEUR, LABSPEC, PHURINE, GLUCOSEU, HGBUR, BILIRUBINUR, KETONESUR, PROTEINUR, UROBILINOGEN, NITRITE, LEUKOCYTESUR Sepsis Labs: @LABRCNTIP (procalcitonin:4,lacticidven:4)  ) Recent Results (from the past 240 hour(s))  MRSA PCR Screening     Status: None  Collection Time: 09/07/15 11:39 AM  Result Value Ref Range Status   MRSA by PCR NEGATIVE NEGATIVE Final    Comment:        The GeneXpert MRSA Assay (FDA approved for NASAL specimens only), is one component of a comprehensive MRSA colonization surveillance program. It is not intended to diagnose MRSA infection nor to guide or monitor treatment for MRSA infections.          Radiology Studies: No results found.      Scheduled Meds: . antiseptic oral rinse  7 mL Mouth Rinse BID  . apixaban  5 mg Oral BID  . azithromycin  500 mg Intravenous Q24H  . cefTRIAXone (ROCEPHIN)  IV  1 g Intravenous Q24H  . diltiazem  360 mg Oral Daily  . insulin aspart  0-20 Units Subcutaneous TID WC  . insulin aspart  0-5 Units Subcutaneous QHS  . insulin aspart  8 Units Subcutaneous TID  WC  . insulin glargine  20 Units Subcutaneous QHS  . ipratropium  0.5 mg Nebulization QID  . levalbuterol  0.63 mg Nebulization QID  . levothyroxine  88 mcg Oral QAC breakfast  . [START ON 09/11/2015] methylPREDNISolone (SOLU-MEDROL) injection  60 mg Intravenous Daily  . metoprolol tartrate  25 mg Oral Daily  . rosuvastatin  10 mg Oral Daily   Continuous Infusions:    LOS: 3 days    Time spent: 40 minutes    WOODS, Roselind Messier, MD [facility] Pager [phone]   If 7PM-7AM, please contact night-coverage [email or web address] Password [email or web address] 09/10/2015, 12:12 PM

## 2015-09-10 NOTE — Progress Notes (Signed)
Subjective: [name] is a 75 yo F who has a PHM of HTN, HLD, CAD (s/p DES to RCA in 2005, and circ in 2012), chronic AF (on home eliquis), COPD (on home O2), and hypothyroidism presented with weakness for the last 2 months, was found to be in respiratory failure possibly related to pneumonia. EKG showed Afib.  Yesterday pt HR was elevated to 120-150s after po diltiazem and metoprolol were given. The patient remained asymptomatic. An order for immediate release diltiazem was placed and she was transferred to step down.   Today [name] states she is feeling well. She would like to ambulate more. Denies chest pain, sob, palpitations, abdominal pain, cough, wheeze or edema.   Objective: Vital signs in last 24 hours: Temp:  [97.6 F (36.4 C)-98.1 F (36.7 C)] 97.8 F (36.6 C) (07/13 0800) Pulse Rate:  [30-110] 100 (07/13 0800) Resp:  [15-25] 22 (07/13 0800) BP: (90-127)/(52-82) 110/57 mmHg (07/13 0800) SpO2:  [94 %-100 %] 100 % (07/13 0851) Weight:  [155 lb 11.2 oz (70.625 kg)] 155 lb 11.2 oz (70.625 kg) (07/13 0339) Weight change: 3.2 oz (0.091 kg)  Physical Exam: Physical Exam  Constitutional: She appears well-developed and well-nourished. No distress.  Neck: No JVD present.  Cardiovascular: Regular rhythm, S1 normal and S2 normal.  Tachycardia present.   Respiratory: Effort normal and breath sounds normal. No respiratory distress. She has no wheezes. She has no rales.  GI: Soft. Bowel sounds are normal. She exhibits no distension. There is no tenderness.  Extremities: decreased peripheral pulses, no edema   Intake/Output from previous day: 07/12 0701 - 07/13 0700 In: 738 [P.O.:438; IV Piggyback:300] Out: -   Intake/Output Summary (Last 24 hours) at 09/10/15 1115 Last data filed at 09/09/15 1900  Gross per 24 hour  Intake    658 ml  Output      0 ml  Net    658 ml   Net I/Os this admission:+910   Recent Labs Lab 09/07/15 1228 09/08/15 0635 09/09/15 0306  NA 136 137  137  K 3.2* 3.8 4.0  CL 95* 96* 95*  CO2 33* 34* 33*  BUN 21* 17 17  CREATININE 0.98 0.69 0.87     Recent Labs Lab 09/07/15 1228 09/08/15 0404 09/09/15 0306  WBC 13.9* 9.2 9.6  HGB 13.9 13.7 13.4  HCT 45.7 45.2 43.7  PLT 249 240 226   Invalid input(s): TROP,  BNP No results for input(s): TROPONINI in the last 168 hours.  Imaging:  Tele: Afib with rate 120-150s Echo: (09/08/15) LVEF was in the range of 60% to 65%. Wall motion was normal (10/02/14) LVEF was in the range of 65% to 70%. Wall motion was normal Cath: s/p DES to RCA in 2005, and circ in 2012 NST: non in computer  Assessment/Plan: Principal Problem:   Acute on chronic respiratory failure (HCC) Active Problems:   Dyslipidemia   Essential hypertension, benign   CAD S/P percutaneous coronary angioplasty   COPD with acute exacerbation (HCC)   Chronic atrial fibrillation (HCC)   Atrial fibrillation with RVR (HCC)   Chronic anticoagulation-CHADs VASc=6   Type 2 diabetes with decreased circulation (HCC)   Acute on chronic respiratory failure with hypoxia (HCC)   CAP (community acquired pneumonia)   Acute on chronic diastolic CHF (congestive heart failure) (HCC)   Other specified hypothyroidism  Chronic Afib patients still has elevated rate 120-150s especially when sitting up or ambulating. CHA2DS2 VASc= 5 (HTN, DM, Age 35-74, Female). HAS BLED =  1 (Age >65)   Increase diltiazem to 360 mg to achieve better rate control   Continue eliquis 5mg  (age <80, SCr 0.87)  Chronic disastolic CHF since admission I/O +910, clinically pt appears euvolemic. BNP   Continue strict I/Os, metoprolol   Chronic HTN   Continue to monitor   On metoprolol, BP has remained low normal no need to resume home lisinopril at this time  HLD lipid panel (09/08/15) total chol 117, TAG 52, HDL 62, LDL 45  Continue rosuvastatin   Pneumonia afebrile, WBC 9.6   DM episode of hyperglycemia last night, CBG 424 >381  FEN cardiac diet  DVT PPx  on eliquis    LOS: 3 days   Eulah PontNina Blum, MD 09/10/2015, 11:15 AM Pager: [phone]  I have seen and examined the patient along with Eulah PontNina Blum, MD.  I have reviewed the chart, notes and new data.  I agree with her note.  Key new complaints: no palpitations, reports dyspnea at baseline Key examination changes: heart rate 130s walking with PT, often >100   PLAN: Diltiazem increased to 360 mg daily.  Thurmon FairMihai Cohen Doleman, MD, [facility] DallasFACC [facility] [phone] 09/10/2015, 12:21 PM

## 2015-09-10 NOTE — Evaluation (Signed)
Physical Therapy Evaluation Patient Details Name: [name] [name] MRN: [id number] DOB: [birth year] Today's Date: 09/10/2015   History of Present Illness  Pt is a 75 y/o F who presented with weakness and was found to be in respiratory failure. EKG showed Afib. Found to have PNA.   Pt's PMH includes COPD, chronic AF, excision of melanoma.    Clinical Impression  Pt admitted with above diagnosis. Pt currently with functional limitations due to the deficits listed below (see PT Problem List). Ms. Coccia presents with impaired balance with high level activities.  PTA pt independent.  HR fluctuates 100-136 with activity. Pt will benefit from skilled PT to increase their independence and safety with mobility with focus on high level balance interventions to allow discharge to the venue listed below.      Follow Up Recommendations Home health PT;Supervision - Intermittent    Equipment Recommendations  None recommended by PT    Recommendations for Other Services       Precautions / Restrictions Precautions Precautions: Fall;Other (comment) Precaution Comments: monitor O2, HR Restrictions Weight Bearing Restrictions: No      Mobility  Bed Mobility Overal bed mobility: Independent             General bed mobility comments: No cues or physical assist needed  Transfers Overall transfer level: Needs assistance Equipment used: None Transfers: Sit to/from Stand Sit to Stand: Supervision         General transfer comment: Supervision for safety  Ambulation/Gait Ambulation/Gait assistance: Supervision Ambulation Distance (Feet): 80 Feet Assistive device: None Gait Pattern/deviations: Step-through pattern;Decreased stride length     General Gait Details: Supervison for safety.  Pt initially reaching out for objects to hold onto while ambulating in room but instructed not to do so and pt continues to only require supervision for safety.  Stairs            Wheelchair  Mobility    Modified Rankin (Stroke Patients Only)       Balance Overall balance assessment: Needs assistance Sitting-balance support: No upper extremity supported;Feet supported Sitting balance-Leahy Scale: Normal     Standing balance support: No upper extremity supported;During functional activity Standing balance-Leahy Scale: Good   Single Leg Stance - Right Leg: 4 (LOB after 4 seconds with UE support) Single Leg Stance - Left Leg: 3 (LOB after 3 seconds without UE support) Tandem Stance - Right Leg: 10 (LOB after 10 seconds without UE support) Tandem Stance - Left Leg: 12 (LOB after 10 seconds without UE support) Rhomberg - Eyes Opened: 30 (mild sway without UE supported) Rhomberg - Eyes Closed: 21 (without UE supported)                 Pertinent Vitals/Pain Pain Assessment: No/denies pain    Home Living Family/patient expects to be discharged to:: Private residence Living Arrangements: Children (son) Available Help at Discharge: Family;Available PRN/intermittently Type of Home: House Home Access: Stairs to enter Entrance Stairs-Rails: None Entrance Stairs-Number of Steps: 1 Home Layout: One level Home Equipment: Grab bars - tub/shower;Walker - 2 wheels;Cane - single point;Shower seat      Prior Function Level of Independence: Independent         Comments: Driving, sits on shower seat to bathe, Ind without AD.     Hand Dominance   Dominant Hand: Right    Extremity/Trunk Assessment   Upper Extremity Assessment: Overall WFL for tasks assessed           Lower Extremity Assessment: Overall [facility]  for tasks assessed      Cervical / Trunk Assessment: Normal  Communication   Communication: No difficulties  Cognition Arousal/Alertness: Awake/alert Behavior During Therapy: WFL for tasks assessed/performed Overall Cognitive Status: Within Functional Limits for tasks assessed                      General Comments General comments (skin  integrity, edema, etc.): SpO2 remains in 90s throughout session.  HR fluctuates 100-136, pt asymptomatic.    Exercises General Exercises - Lower Extremity Hip Flexion/Marching: Both;10 reps;Standing;Other (comment) (with single UE support) Mini-Sqauts: Strengthening;10 reps;Other (comment) (with Bil UE support)      Assessment/Plan    PT Assessment Patient needs continued PT services  PT Diagnosis Difficulty walking   PT Problem List Decreased balance;Decreased safety awareness  PT Treatment Interventions Therapeutic activities;Therapeutic exercise;Balance training;Neuromuscular re-education;Patient/family education   PT Goals (Current goals can be found in the Care Plan section) Acute Rehab PT Goals Patient Stated Goal: to return home PT Goal Formulation: With patient Time For Goal Achievement: 09/24/15 Potential to Achieve Goals: Good    Frequency Min 3X/week   Barriers to discharge        Co-evaluation               End of Session Equipment Utilized During Treatment: Gait belt;Oxygen Activity Tolerance: Patient tolerated treatment well Patient left: in bed;Other (comment);with call bell/phone within reach (sitting EOB) Nurse Communication: Mobility status         Time: [id number] PT Time Calculation (min) (ACUTE ONLY): 21 min   Charges:   PT Evaluation $PT Eval Low Complexity: 1 Procedure     PT G Codes:       Encarnacion ChuAshley Nixon Kolton PT, DPT  Pager: [phone] Phone: [phone] 09/10/2015, 11:55 AM

## 2015-09-11 DIAGNOSIS — E785 Hyperlipidemia, unspecified: Secondary | ICD-10-CM

## 2015-09-11 DIAGNOSIS — I5033 Acute on chronic diastolic (congestive) heart failure: Secondary | ICD-10-CM | POA: Diagnosis present

## 2015-09-11 DIAGNOSIS — E1165 Type 2 diabetes mellitus with hyperglycemia: Secondary | ICD-10-CM | POA: Diagnosis present

## 2015-09-11 DIAGNOSIS — E118 Type 2 diabetes mellitus with unspecified complications: Secondary | ICD-10-CM

## 2015-09-11 DIAGNOSIS — IMO0002 Reserved for concepts with insufficient information to code with codable children: Secondary | ICD-10-CM | POA: Diagnosis present

## 2015-09-11 LAB — GLUCOSE, CAPILLARY
GLUCOSE-CAPILLARY: 159 mg/dL — AB (ref 65–99)
GLUCOSE-CAPILLARY: 165 mg/dL — AB (ref 65–99)

## 2015-09-11 MED ORDER — LEVALBUTEROL HCL 0.63 MG/3ML IN NEBU: 0.6300 mg | INHALATION_SOLUTION | RESPIRATORY_TRACT | Status: DC | PRN

## 2015-09-11 MED ORDER — INSULIN PEN NEEDLE 29G X 10MM MISC: 10.0000 [IU] | Freq: Every day | Status: DC

## 2015-09-11 MED ORDER — INSULIN GLARGINE 100 UNIT/ML SOLOSTAR PEN: 10.0000 [IU] | PEN_INJECTOR | Freq: Every day | SUBCUTANEOUS | Status: DC

## 2015-09-11 MED ORDER — DILTIAZEM HCL ER COATED BEADS 360 MG PO CP24: 360.0000 mg | ORAL_CAPSULE | Freq: Every day | ORAL | Status: DC

## 2015-09-11 MED ORDER — METOPROLOL TARTRATE 50 MG PO TABS: 50.0000 mg | ORAL_TABLET | Freq: Two times a day (BID) | ORAL | Status: DC

## 2015-09-11 NOTE — Care Management Note (Signed)
Case Management Note  Patient Details  Name: Jeronimo NormaShirley O Coppolino MRN: [id number] Date of Birth: 04/15/1940  Subjective/Objective:     Admitted wotj Acute on Chronic Resp Failure               Action/Plan: [place], [place]; she was mod independent prior to admission, driving; Patient could benefit from a Disease Management Program for Pneumonia; HHC choice offered, she chose [facility]. Lupita Leashonna with [facility] made aware; DME - home oxygen, cane and walker; Private insurance with Medicare; pharmacy of choice is [facility]; patient stated that she does not have any problems getting her medication. Attending MD at discharge please enter the face to face document for [facility].  Expected Discharge Date:    possibly 09/11/2015              Expected Discharge Plan:  Home w Home Health Services  Discharge planning Services  CM Consult Choice offered to:  Patient  HH Arranged:  RN, Disease Management, PT HH Agency:   [facility]  Status of Service:  In process, will continue to follow  Reola MosherChandler, Sinan Tuch L, RN,MHA,BSN [phone] 09/11/2015, 10:32 AM

## 2015-09-11 NOTE — Progress Notes (Signed)
Subjective: [name] [name] is a 75 yo F who has a PHM of HTN, HLD, CAD (s/p DES to RCA in 2005, and circ in 2012), chronic AF (on home eliquis), COPD (on home O2), and hypothyroidism presented with weakness for the last 2 months, was found to be in respiratory failure possibly related to pneumonia. On 7/10 she was transferred from [facility] where she was being treated for PNA and found to be in Afib w RVR. During this admission she has been on diltiazem drip, her heart rates have fluctuated at time 120-150s. Today her HR is 60-110.   Today [name] [name] states she is feeling well. Denies chest pain, weakness, palpitations or shortness of breath. Her SpO2 was 95% while ambulating. She feels ready to go home.   Objective: Vital signs in last 24 hours: Temp:  [97.4 F (36.3 C)-98.1 F (36.7 C)] 98.1 F (36.7 C) (07/14 1138) Pulse Rate:  [62-93] 62 (07/14 1138) Resp:  [16-20] 16 (07/14 1138) BP: (107-131)/(55-71) 107/55 mmHg (07/14 1138) SpO2:  [95 %-99 %] 95 % (07/14 1138) Weight:  [155 lb 12.8 oz (70.67 kg)-158 lb 15.2 oz (72.1 kg)] 155 lb 12.8 oz (70.67 kg) (07/14 0531) Weight change: 3 lb 4 oz (1.475 kg)  Physical Exam: Physical Exam  Constitutional: She appears well-developed and well-nourished. No distress.  Cardiovascular: Normal rate, regular rhythm, S1 normal and S2 normal.   No murmur heard. Respiratory: Effort normal and breath sounds normal. No respiratory distress. She has no wheezes. She has no rales.  GI: Soft. Bowel sounds are normal. She exhibits no distension. There is no tenderness.  Extremities: distal pulses 2+, no peripheral edema   Intake/Output from previous day: 07/13 0701 - 07/14 0700 In: 580 [P.O.:580] Out: 1000 [Urine:1000]  Intake/Output Summary (Last 24 hours) at 09/11/15 1407 Last data filed at 09/11/15 1022  Gross per 24 hour  Intake    920 ml  Output   1450 ml  Net   -530 ml  Net I/Os this admission: +140   Recent Labs Lab 09/07/15 1228 09/08/15 0635  09/09/15 0306  NA 136 137 137  K 3.2* 3.8 4.0  CL 95* 96* 95*  CO2 33* 34* 33*  BUN 21* 17 17  CREATININE 0.98 0.69 0.87     Recent Labs Lab 09/07/15 1228 09/08/15 0404 09/09/15 0306  WBC 13.9* 9.2 9.6  HGB 13.9 13.7 13.4  HCT 45.7 45.2 43.7  PLT 249 240 226   Invalid input(s): TROP,  BNP No results for input(s): TROPONINI in the last 168 hours.  Imaging:  EKG: Tele: Afib with rate 107 Echo: (09/08/15) LVEF was in the range of 60% to 65%. Wall motion was normal (10/02/14) LVEF was in the range of 65% to 70%. Wall motion was normal Cath: s/p DES to RCA in 2005, and circ in 2012 NST: non in epic   Assessment/Plan: Principal Problem:   Acute on chronic respiratory failure (HCC) Active Problems:   Dyslipidemia   Essential hypertension, benign   CAD S/P percutaneous coronary angioplasty   COPD with acute exacerbation (HCC)   Chronic atrial fibrillation (HCC)   Atrial fibrillation with RVR (HCC)   Chronic anticoagulation-CHADs VASc=6   Type 2 diabetes with decreased circulation (HCC)   Acute on chronic respiratory failure with hypoxia (HCC)   CAP (community acquired pneumonia)   Acute on chronic diastolic CHF (congestive heart failure) (HCC)   Other specified hypothyroidism   Uncontrolled type 2 diabetes mellitus with complication, with long-term current use  of insulin (HCC)   Diastolic CHF, acute on chronic (HCC)   Uncontrolled type 2 diabetes mellitus with complication (HCC)  Chronic Afib patient rate is under better control has decreased to 60-100s especially when sitting up or ambulating. CHA2DS2 VASc= 5 (CHF, HTN, DM, Age 74-74, Female). HAS BLED = 1 (Age >65)  continue diltiazem, metopprolol  and eliquis  Chronic disastolic CHF since admission I/O +140, clinically pt appears euvolemic.  Continue strict I/Os, metoprolol   Chronic HTN  Continue to monitor  On metoprolol, BP has remained low normal no need to  resume home lisinopril at this time  HLD lipid panel (09/09/15) total chol 117, TAG 52, HDL 62, LDL 45 Continue rosuvastatin   Pneumonia abx complete, afebrile  DM continue ISS   FEN cardiac diet  DVT PPx on eliquis    LOS: 4 days   Eulah PontNina Blum, MD 09/11/2015, 2:07 PM Pager: [phone]  I have seen and examined the patient along with Eulah PontNina Blum, MD.  I have reviewed the chart, notes and new data.  I agree with her note.  Key new complaints: she states she feels fine, back to her baseline Key examination changes: clear lungs, borderline rate control   PLAN: Suspect rate control will continue to improve as she uses lower and less frequent doses of bronchodilators. She is on high doses of diltiazem and relatively high doses of metoprolol (especially considering she has obstructive lung disease). Would not add any more AV blocking agents at this time. Continue anticoagulation. Please call over the weekend if there are any Cardiology questions. Thank you.  Thurmon FairMihai Ariday Brinker, MD, [facility] [phone] 09/11/2015, 2:31 PM

## 2015-09-11 NOTE — Progress Notes (Signed)
SATURATION QUALIFICATIONS: (This note is used to comply with regulatory documentation for home oxygen)  Patient Saturations on Room Air at Rest = 89 %  Patient Saturations on Room Air while Ambulating = 87%  Patient Saturations on 2 Liters of oxygen while Ambulating = 95%  Please briefly explain why patient needs home oxygen: Pt is home dependentt on 2l /m  [place]

## 2015-09-11 NOTE — Care Management Important Message (Signed)
Important Message  Patient Details  Name: [name] NormaShirley O Alsobrook MRN: [id number] Date of Birth: 06/15/1940   Medicare Important Message Given:  Yes    Kyla BalzarineShealy, Yarixa Lightcap Abena 09/11/2015, 11:37 AM

## 2015-09-11 NOTE — Discharge Summary (Signed)
Physician Discharge Summary  [name] [name]:[id number] DOB: 1940/10/02 DOA: 09/07/2015  PCP: Ignatius Specking, MD  Admit date: 09/07/2015 Discharge date: 09/11/2015  Time spent: 35 minutes  Recommendations for Outpatient Follow-up:   Acute on chronic respiratory failure with hypoxia/CAP  -Resolving -Completed five-day course antibiotics SATURATION QUALIFICATIONS: (This note is used to comply with regulatory documentation for home oxygen) Patient Saturations on Room Air at Rest = 89 % Patient Saturations on Room Air while Ambulating = 87% Patient Saturations on 2 Liters of oxygen while Ambulating = 95% Please briefly explain why patient needs home oxygen: Pt is home dependentt on 2l /m [place] Patient to use 2 L O2 via [place] when ambulatory.  Acute on chronic Diastolic CHF  -Strict in and out since admission + 380 ml -Daily weight Filed Weights   09/10/15 0339 09/10/15 1601 09/11/15 0531  Weight: 70.625 kg (155 lb 11.2 oz) 72.1 kg (158 lb 15.2 oz) 70.67 kg (155 lb 12.8 oz)  -Patient will weigh herself at home upon discharge this will be her base weight. Weigh self daily and record in journal. Take discharge to all future doctors appointments.  Afib with RVR/Dilated Cardiomyopathy  -Eliquis 5 mg BID -Diltiazem 360 mg daily  -Metoprolol 50 mg BID  -2D echo normal EF, mild dilated cardiomyopathy  DM type II uncontrolled with complication -7/12 Hemoglobin A1c= 8.4  -Lipid panel within ADA guidelines -Restart home Glipizide 10 mg BID -For discharge decrease Lantus to 10 units daily  -Follow-up with PCP. Fingerstick blood sugar before each meal, and before bedtime record findings in journal along with all food consumed during the day. Take to follow-up with PCP. -Follow-up with Dr. Ignatius Specking in 1-2 weeks  Hypothyroid  -7/12 TSH; normal pending  -Synthroid 88 g daily    Discharge Diagnoses:  Principal Problem:   Acute on chronic respiratory failure (HCC) Active  Problems:   Dyslipidemia   Essential hypertension, benign   CAD S/P percutaneous coronary angioplasty   COPD with acute exacerbation (HCC)   Chronic atrial fibrillation (HCC)   Atrial fibrillation with RVR (HCC)   Chronic anticoagulation-CHADs VASc=6   Type 2 diabetes with decreased circulation (HCC)   Acute on chronic respiratory failure with hypoxia (HCC)   CAP (community acquired pneumonia)   Acute on chronic diastolic CHF (congestive heart failure) (HCC)   Other specified hypothyroidism   Uncontrolled type 2 diabetes mellitus with complication, with long-term current use of insulin (HCC)   Diastolic CHF, acute on chronic (HCC)   Uncontrolled type 2 diabetes mellitus with complication [facility])   Discharge Condition: Stable  Diet recommendation: Heart healthy/American diabetic Association  Filed Weights   09/10/15 0339 09/10/15 1601 09/11/15 0531  Weight: 70.625 kg (155 lb 11.2 oz) 72.1 kg (158 lb 15.2 oz) 70.67 kg (155 lb 12.8 oz)    History of present illness:  75yo WF PMHx HTN, CAD, Proximal Atrial Fibrillation, COPD on 2- 3 L O2, 24 hour/dy, Chronic Diastolic CHF/Cardiomyopathy, CAD native artery,Sinus bradycardia, DM type II, Hypothyroidism  initially admitted 7/6 to [facility] hospital with with progressive SOB, weakness and wheezing, cough. She was treated for CAP, AECOPD and acute on chronic diastolic heart failure. She was treated with levaquin and IV steroids. Course was c/b AFib with RVR requiring cardizem gtt as well as delirium. Pt and family requested tx to [facility] 7/10.   Currently feeling some better. Still some SOB but overall improved. Denies chest pain, hemoptysis, purulent sputum, lightheadedness, chills.  During this hospital  physician patient was treated for acute on chronic respiratory failure with hypoxia secondary to CAP. In addition patient's cardiac medication for A. fib with RVR/dilated cardiomyopathy were adjusted. Patient again received instruction  on diabetic control to include diabetic diet, as well as adjustment to her diabetic medication.  Procedures: 7/11 Echocardiogram: LVEF; 60% to 65%. --Left atrium: mildly dilated.--- Right ventricle mildly dilated. --- Right atrium: mildly to moderately dilated.  Consultations: Dr.Mihai Croitoru cardiology Dr.[facility]    Antibiotics Levaquin 7/6?>> 7/10? Started at [facility] hospital Azithromycin 7/10 l>> 7/14 Ceftriaxone 7/10 l>> 7/14   Discharge Exam: Filed Vitals:   09/10/15 2100 09/11/15 0531 09/11/15 0835 09/11/15 1138  BP:  121/62  107/55  Pulse:  93  62  Temp:  97.4 F (36.3 C)  98.1 F (36.7 C)  TempSrc:  Oral  Oral  Resp:  20  16  Height:      Weight:  70.67 kg (155 lb 12.8 oz)    SpO2: 98% 98% 98% 95%    General:A/O 4, acute on chronic respiratory failure Eyes: negative scleral hemorrhage, negative anisocoria, negative icterus ENT: Negative Runny nose, negative gingival bleeding, Neck: Negative scars, masses, torticollis, lymphadenopathy, JVD Lungs: Clear to auscultation bilateral, negative wheezing, negative crackles Cardiovascular: Regular rate and rhythm without murmur gallop or rub normal S1 and S2   Discharge Instructions     Medication List    STOP taking these medications        albuterol (2.5 MG/3ML) 0.083% nebulizer solution  Commonly known as:  PROVENTIL     diltiazem 240 MG 24 hr capsule  Commonly known as:  DILACOR XR     hydrochlorothiazide 25 MG tablet  Commonly known as:  HYDRODIURIL     lisinopril 40 MG tablet  Commonly known as:  PRINIVIL,ZESTRIL     potassium chloride SA 20 MEQ tablet  Commonly known as:  K-DUR,KLOR-CON     PROAIR HFA 108 (90 Base) MCG/ACT inhaler  Generic drug:  albuterol      TAKE these medications        ALPRAZolam 0.5 MG tablet  Commonly known as:  XANAX  Take 0.5 mg by mouth 3 (three) times daily as needed for anxiety.     BREO ELLIPTA 100-25 MCG/INH Aepb  Generic drug:  fluticasone  furoate-vilanterol  Inhale 1 puff into the lungs daily.     CALCIUM 600 PO  Take 1 tablet by mouth 2 (two) times daily.     DEXILANT 60 MG capsule  Generic drug:  dexlansoprazole  Take 60 mg by mouth daily.     diltiazem 360 MG 24 hr capsule  Commonly known as:  CARDIZEM CD  Take 1 capsule (360 mg total) by mouth daily.     ELIQUIS 5 MG Tabs tablet  Generic drug:  apixaban  TAKE 1 TABLET BY MOUTH TWICE DAILY     glipiZIDE 10 MG tablet  Commonly known as:  GLUCOTROL  Take 10 mg by mouth 2 (two) times daily before a meal.     HYDROcodone-acetaminophen 5-325 MG tablet  Commonly known as:  NORCO/VICODIN  Take 1 tablet by mouth 2 (two) times daily as needed for moderate pain.     Insulin Glargine 100 UNIT/ML Solostar Pen  Commonly known as:  LANTUS  Inject 10 Units into the skin daily at 10 pm.     Insulin Pen Needle 29G X Misc  10 Units by Does not apply route at bedtime.     ipratropium  0.02 % nebulizer solution  Commonly known as:  ATROVENT  Take 500 mcg by nebulization as directed.     levalbuterol 0.63 MG/3ML nebulizer solution  Commonly known as:  XOPENEX  Take 3 mLs (0.63 mg total) by nebulization every 3 (three) hours as needed for wheezing or shortness of breath.     levothyroxine 88 MCG tablet  Commonly known as:  SYNTHROID, LEVOTHROID  Take 88 mcg by mouth daily before breakfast.     meclizine 25 MG tablet  Commonly known as:  ANTIVERT  Take 12.5 mg by mouth daily as needed for dizziness.     metoprolol 50 MG tablet  Commonly known as:  LOPRESSOR  Take 1 tablet (50 mg total) by mouth 2 (two) times daily.     nitroGLYCERIN 0.4 MG SL tablet  Commonly known as:  NITROSTAT  Place 1 tablet (0.4 mg total) under the tongue every 5 (five) minutes x 3 doses as needed for chest pain. For severe chest pain     OXYGEN  Inhale 2-3 L into the lungs continuous.     rosuvastatin 10 MG tablet  Commonly known as:  CRESTOR  Take 1 tablet (10 mg total) by mouth  daily.       Allergies  Allergen Reactions  . Adalat [Nifedipine]   . Claritin-D 12 Hour [Loratadine-Pseudoephedrine Er]   . Codeine     REACTION: stomach upset  . Plavix [Clopidogrel Bisulfate]   . Wellbutrin [Bupropion]   . Ciprofloxacin Nausea And Vomiting  . Metformin And Related Other (See Comments)    Fatigue     Follow-up Information    Follow up with [facility].   Why:  They will do your home health care at your home   Contact information:   [address] [place] [address] [phone]       Follow up with Ignatius Speckinghruv B Vyas, MD In 2 weeks.   Specialty:  Internal Medicine   Why:  Follow-up with Dr. Ignatius Speckinghruv B Vyas in 1-2 weeks diabetes type 2 uncontrolled, acute on chronic respiratory failure, A. fib RVR   Contact information:   [address] [place] [address] [phone]        The results of significant diagnostics from this hospitalization (including imaging, microbiology, ancillary and laboratory) are listed below for reference.    Significant Diagnostic Studies: Dg Chest Port 1 View  09/07/2015  CLINICAL DATA:  Cough and shortness of Breath EXAM: PORTABLE CHEST 1 VIEW COMPARISON:  09/03/2015 FINDINGS: Cardiac shadow remains enlarged. The left lung remains clear. Patchy changes are again noted in the right lung base stable from the previous exam. No new focal abnormality is noted. IMPRESSION: No change in right basilar infiltrates. Electronically Signed   By: Alcide CleverMark  Lukens M.D.   On: 09/07/2015 13:43    Microbiology: Recent Results (from the past 240 hour(s))  MRSA PCR Screening     Status: None   Collection Time: 09/07/15 11:39 AM  Result Value Ref Range Status   MRSA by PCR NEGATIVE NEGATIVE Final    Comment:        The GeneXpert MRSA Assay (FDA approved for NASAL specimens only), is one component of a comprehensive MRSA colonization surveillance program. It is not intended to diagnose MRSA infection nor to guide or monitor  treatment for MRSA infections.      Labs: Basic Metabolic Panel:  Recent Labs Lab 09/07/15 1228 09/08/15 0635 09/09/15 0306  NA 136 137 137  K 3.2* 3.8 4.0  CL 95* 96* 95*  CO2 33* 34* 33*  GLUCOSE 262* 170* 204*  BUN 21* 17 17  CREATININE 0.98 0.69 0.87  CALCIUM 8.9 8.8* 8.9  MG 1.8  --  2.0  PHOS 3.5  --   --    Liver Function Tests:  Recent Labs Lab 09/09/15 0306  AST 20  ALT 26  ALKPHOS 40  BILITOT 0.8  PROT 6.0*  ALBUMIN 3.3*   No results for input(s): LIPASE, AMYLASE in the last 168 hours. No results for input(s): AMMONIA in the last 168 hours. CBC:  Recent Labs Lab 09/07/15 1228 09/08/15 0404 09/09/15 0306  WBC 13.9* 9.2 9.6  NEUTROABS  --   --  8.8*  HGB 13.9 13.7 13.4  HCT 45.7 45.2 43.7  MCV 99.1 99.1 97.8  PLT 249 240 226   Cardiac Enzymes: No results for input(s): CKTOTAL, CKMB, CKMBINDEX, TROPONINI in the last 168 hours. BNP: BNP (last 3 results)  Recent Labs  09/07/15 1228  BNP 297.0*    ProBNP (last 3 results) No results for input(s): PROBNP in the last 8760 hours.  CBG:  Recent Labs Lab 09/10/15 1211 09/10/15 1700 09/10/15 2030 09/11/15 0621 09/11/15 1140  GLUCAP 281* 324* 287* 159* 165*       Signed:  Carolyne Littles, MD [facility] [phone] pager

## 2015-09-11 NOTE — Progress Notes (Signed)
Pt HR sustaining 130's. Pt in bed resting asymptomatic. Dr Henreitta LeberFudium with Cardiology notified. New orders received. Will continue to monitor.

## 2015-09-11 NOTE — Progress Notes (Signed)
Discharge instructions given to opt .Verbalized understang . Waiting foe family for [place] to home

## 2015-09-28 ENCOUNTER — Telehealth: Payer: Self-pay | Admitting: Cardiology

## 2015-09-28 NOTE — Telephone Encounter (Signed)
Pt very confused about medications from hospital d/c. Verbally went over changes per d/c summary while pt went through medications. Pt voiced understanding of changes. Pt will call back with any further questions

## 2015-09-28 NOTE — Telephone Encounter (Signed)
[name] [name] called stating that she is confused about her heart medications.

## 2015-10-01 ENCOUNTER — Inpatient Hospital Stay: Payer: Medicare Other | Admitting: Adult Health

## 2015-10-14 ENCOUNTER — Ambulatory Visit: Payer: Medicare Other | Admitting: Cardiology

## 2015-10-22 ENCOUNTER — Encounter: Payer: Self-pay | Admitting: *Deleted

## 2015-10-23 ENCOUNTER — Encounter: Payer: Self-pay | Admitting: Cardiology

## 2015-10-23 ENCOUNTER — Ambulatory Visit (INDEPENDENT_AMBULATORY_CARE_PROVIDER_SITE_OTHER): Payer: Medicare Other | Admitting: Cardiology

## 2015-10-23 VITALS — BP 135/67 | HR 85 | Ht 62.0 in | Wt 152.0 lb

## 2015-10-23 DIAGNOSIS — I2510 Atherosclerotic heart disease of native coronary artery without angina pectoris: Secondary | ICD-10-CM | POA: Diagnosis not present

## 2015-10-23 DIAGNOSIS — J449 Chronic obstructive pulmonary disease, unspecified: Secondary | ICD-10-CM

## 2015-10-23 DIAGNOSIS — I10 Essential (primary) hypertension: Secondary | ICD-10-CM | POA: Diagnosis not present

## 2015-10-23 DIAGNOSIS — I482 Chronic atrial fibrillation, unspecified: Secondary | ICD-10-CM

## 2015-10-23 MED ORDER — METOPROLOL TARTRATE 50 MG PO TABS: 50.0000 mg | ORAL_TABLET | Freq: Two times a day (BID) | ORAL | 3 refills | Status: DC

## 2015-10-23 NOTE — Patient Instructions (Signed)
Your physician recommends that you schedule a follow-up appointment in: 3 MONTHS [facility]  Your physician recommends that you continue on your current medications as directed. Please refer to the Current Medication list given to you today.  TAKE LOPRESSOR 50 MG TWICE DAILY   Thank you for choosing [facility]!!

## 2015-10-23 NOTE — Progress Notes (Signed)
Cardiology Office Note  Date: 10/23/2015   ID: [name] [name], DOB 10/28/1940, MRN [id number]  PCP: Ignatius Speckinghruv B Vyas, MD  Primary Cardiologist: Nona DellSamuel McDowell, MD   Chief Complaint  Patient presents with  . Atrial Fibrillation    History of Present Illness: [name] [name] is a 75 y.o. female last seen in June. Record review finds hospitalization in July with acute on chronic hypoxic respiratory failure in the setting of chronic lung disease with component of diastolic heart failure and uncontrolled atrial fibrillation. She was seen by our cardiology team and underwent medication adjustments. Diltiazem CD was increased to 360 mg daily, she continued on Eliquis, and was also treated with Lopressor 50 mg twice daily.  She comes in for a follow-up visit today. She states that she has only been taking the Lopressor once a day, wasn't sure if it was suppose to be twice a day or not. Otherwise reports compliance with her medications. Heart rate control is adequate and she is not reporting any significant palpitations at this time.  Follow-up echocardiogram from July as outlined below. LVEF remains normal range.  Past Medical History:  Diagnosis Date  . CAD S/P percutaneous coronary angioplasty 2005, 2012   RCA DES,CFX DES-low risk Nuc June 2016  . Cardiomyopathy (HCC)    EF 65-70% Aug 2016 echo  . Carotid artery disease (HCC)   . Chronic a-fib (HCC)    Eliquis started July 2016  . COPD (chronic obstructive pulmonary disease) (HCC)    Home O2  . Essential hypertension, benign   . Hyperlipidemia   . Hypothyroidism   . Sinus bradycardia    HR 30-40s with associated lightheadedness/presyncoe. AVN blockers held with improvement.  . Type 2 diabetes mellitus (HCC)     Past Surgical History:  Procedure Laterality Date  . ABDOMINAL HYSTERECTOMY    . APPENDECTOMY    . CHOLECYSTECTOMY    . Excision of Melanoma  04/29/2010   Lower left neck    Current Outpatient Prescriptions    Medication Sig Dispense Refill  . ALPRAZolam (XANAX) 0.5 MG tablet Take 0.5 mg by mouth 3 (three) times daily as needed for anxiety.    . Calcium Carbonate (CALCIUM 600 PO) Take 1 tablet by mouth 2 (two) times daily.    Marland Kitchen. dexlansoprazole (DEXILANT) 60 MG capsule Take 60 mg by mouth daily.      Marland Kitchen. diltiazem (CARDIZEM CD) 360 MG 24 hr capsule Take 1 capsule (360 mg total) by mouth daily. 30 capsule 0  . ELIQUIS 5 MG TABS tablet TAKE 1 TABLET BY MOUTH TWICE DAILY 60 tablet 5  . Fluticasone Furoate-Vilanterol (BREO ELLIPTA) 100-25 MCG/INH AEPB Inhale 1 puff into the lungs daily.    Marland Kitchen. glipiZIDE (GLUCOTROL) 10 MG tablet Take 10 mg by mouth 2 (two) times daily before a meal.     . HYDROcodone-acetaminophen (NORCO/VICODIN) 5-325 MG tablet Take 1 tablet by mouth 2 (two) times daily as needed for moderate pain.    Marland Kitchen. ipratropium (ATROVENT) 0.02 % nebulizer solution Take 500 mcg by nebulization as directed.      . levalbuterol (XOPENEX) 0.63 MG/3ML nebulizer solution Take 3 mLs (0.63 mg total) by nebulization every 3 (three) hours as needed for wheezing or shortness of breath. 3 mL 12  . levothyroxine (SYNTHROID, LEVOTHROID) 88 MCG tablet Take 88 mcg by mouth daily before breakfast.    . meclizine (ANTIVERT) 25 MG tablet Take 12.5 mg by mouth daily as needed for dizziness.    .Marland Kitchen  metoprolol (LOPRESSOR) 50 MG tablet Take 1 tablet (50 mg total) by mouth 2 (two) times daily. 60 tablet 3  . nitroGLYCERIN (NITROSTAT) 0.4 MG SL tablet Place 1 tablet (0.4 mg total) under the tongue every 5 (five) minutes x 3 doses as needed for chest pain. For severe chest pain 25 tablet 3  . OXYGEN Inhale 2-3 L into the lungs continuous.    . rosuvastatin (CRESTOR) 10 MG tablet Take 1 tablet (10 mg total) by mouth daily. 30 tablet 6   No current facility-administered medications for this visit.    Allergies:  Adalat [nifedipine]; Claritin-d 12 hour [loratadine-pseudoephedrine er]; Codeine; Plavix [clopidogrel bisulfate]; Wellbutrin  [bupropion]; Ciprofloxacin; and Metformin and related   Social History: The patient  reports that she quit smoking about 3 years ago. Her smoking use included Cigarettes. She started smoking about 51 years ago. She has a 22.50 pack-year smoking history. She has never used smokeless tobacco. She reports that she does not drink alcohol or use drugs.   ROS:  Please see the history of present illness. Otherwise, complete review of systems is positive for mild fatigue.  All other systems are reviewed and negative.   Physical Exam: VS:  BP 135/67   Pulse 85   Ht 5\' 2"  (1.575 m)   Wt 152 lb (68.9 kg)   SpO2 91% Comment: on room air  BMI 27.80 kg/m , BMI Body mass index is 27.8 kg/m.  Wt Readings from Last 3 Encounters:  10/23/15 152 lb (68.9 kg)  09/11/15 155 lb 12.8 oz (70.7 kg)  08/20/15 154 lb 12.8 oz (70.2 kg)    Appears comfortable. HEENT: Conjunctiva and lids normal, oropharynx clear.  Neck: Supple, no elevated JVP, soft left carotid bruit, no thyromegaly.  Lungs: Diminished, scattered rhonchi with prolonged expiratory phase, nonlabored breathing at rest.  Cardiac: Irregularly irregular, no S3, soft systolic murmur, no pericardial rub.  Abdomen: Soft, nontender, bowel sounds present.  Extremities: No pitting edema, distal pulses 2+.  Skin: Warm and dry.  Musculoskeletal: No kyphosis.  Neuropsychiatric: Alert and oriented x3, affect grossly appropriate.  ECG: I personally reviewed the tracing from 09/08/2015 which showed atrial fibrillation at 105 bpm with nonspecific T-wave abnormalities.  Recent Labwork: 09/07/2015: B Natriuretic Peptide 297.0 09/09/2015: ALT 26; AST 20; BUN 17; Creatinine, Ser 0.87; Hemoglobin 13.4; Magnesium 2.0; Platelets 226; Potassium 4.0; Sodium 137; TSH 0.984     Component Value Date/Time   CHOL 117 09/09/2015 0306   TRIG 52 09/09/2015 0306   HDL 62 09/09/2015 0306   CHOLHDL 1.9 09/09/2015 0306   VLDL 10 09/09/2015 0306   LDLCALC 45 09/09/2015  0306    Other Studies Reviewed Today:  Echocardiogram 09/08/2015: Study Conclusions  - Left ventricle: The cavity size was normal. Wall thickness was   normal. Systolic function was normal. The estimated ejection   fraction was in the range of 60% to 65%. Wall motion was normal;   there were no regional wall motion abnormalities. - Left atrium: The atrium was mildly dilated. - Right ventricle: The cavity size was mildly dilated. Systolic   function was mildly reduced. - Right atrium: The atrium was mildly to moderately dilated.   Lexiscan Cardiolite 08/22/2014:  Atrial fibrillation present at baseline. There was no ST segment deviation noted during stress.  There is a fixed defect present in the mid inferoseptal location.  Nuclear stress EF: 44%.  Soft tissue attenuation without clear evidence of scar or ischemia. Intermediate risk study based on LVEF 44%, although  this looks to be underestimated. Suggest echocardiogram to further clarify.  Carotid Dopplers 11/12/2014: Stable 1-39% bilateral ICA stenoses.  Assessment and Plan:  1. Chronic atrial fibrillation. Medications have been adjusted as outlined above. I recommended that she take her Lopressor 50 mg twice daily as directed, otherwise no changes were made.  2. No angina symptoms with history of CAD status post DES to the RCA in 2005 and DES to the circumflex in 2012. Cardiolite study from last year showed no active ischemia.  3. Oxygen requiring COPD. Hospitalization in July noted with hypoxic respiratory failure. She continues to follow with Dr. Sherril Croon.  4. Essential hypertension, blood pressure is adequately controlled today. She is no longer on ACE inhibitor.  Current medicines were reviewed with the patient today.  Disposition: Follow-up with me in 3 months.  Signed, Jonelle Sidle, MD, [facility] 10/23/2015 4:04 PM    [facility] at [facility] [address] [place], [place], [place] [address] Phone:  [phone]; Fax: [phone]

## 2015-10-27 ENCOUNTER — Telehealth: Payer: Self-pay | Admitting: Cardiology

## 2015-10-27 NOTE — Telephone Encounter (Signed)
Mrs. [name] [name] called stating that she thinks the LOPRESSOR 50 MG TWICE DAILY  Is causing her sugar to go up. Please advise.

## 2015-10-27 NOTE — Telephone Encounter (Signed)
Would not expect this to be a common result of being on Lopressor. She already has a history of type 2 diabetes mellitus, and it is possible that she needs some adjustments in her medical therapy to provide better glucose control. It is important to keep her heart rate controlled, and I would try to stick with Lopressor for now. She should follow-up with Dr. Sherril CroonVyas to see if any adjustment need to be made in her diabetes plan.

## 2015-10-27 NOTE — Telephone Encounter (Signed)
Pt says since starting Lopressor sugar has been running higher than normal - usually run 120s-150s now is running 160s-180s. Also c/o cold hands and some lightheadedness. Says BP has been running WNL. Says she hasn't taken metoprolol today. Will forward to provider

## 2015-10-28 NOTE — Telephone Encounter (Signed)
Pt aware and says she will continue to try the metoprolol for now, has f/u with Dr. Sherril CroonVyas 10/6.

## 2015-11-20 ENCOUNTER — Ambulatory Visit: Payer: Medicare Other | Admitting: Cardiology

## 2015-11-25 ENCOUNTER — Emergency Department (HOSPITAL_COMMUNITY): Payer: Medicare Other

## 2015-11-25 ENCOUNTER — Telehealth: Payer: Self-pay | Admitting: Cardiology

## 2015-11-25 ENCOUNTER — Inpatient Hospital Stay (HOSPITAL_COMMUNITY)
Admission: EM | Admit: 2015-11-25 | Discharge: 2015-11-27 | DRG: 291 | Disposition: A | Discharge status: Home / Self Care | Payer: Medicare Other | Attending: Internal Medicine | Admitting: Internal Medicine

## 2015-11-25 ENCOUNTER — Encounter (HOSPITAL_COMMUNITY): Payer: Self-pay | Admitting: *Deleted

## 2015-11-25 DIAGNOSIS — Z87891 Personal history of nicotine dependence: Secondary | ICD-10-CM | POA: Diagnosis not present

## 2015-11-25 DIAGNOSIS — J189 Pneumonia, unspecified organism: Secondary | ICD-10-CM | POA: Diagnosis present

## 2015-11-25 DIAGNOSIS — I5033 Acute on chronic diastolic (congestive) heart failure: Principal | ICD-10-CM | POA: Diagnosis present

## 2015-11-25 DIAGNOSIS — J90 Pleural effusion, not elsewhere classified: Secondary | ICD-10-CM

## 2015-11-25 DIAGNOSIS — I482 Chronic atrial fibrillation, unspecified: Secondary | ICD-10-CM

## 2015-11-25 DIAGNOSIS — E876 Hypokalemia: Secondary | ICD-10-CM | POA: Diagnosis present

## 2015-11-25 DIAGNOSIS — F419 Anxiety disorder, unspecified: Secondary | ICD-10-CM | POA: Diagnosis present

## 2015-11-25 DIAGNOSIS — Z955 Presence of coronary angioplasty implant and graft: Secondary | ICD-10-CM

## 2015-11-25 DIAGNOSIS — J9621 Acute and chronic respiratory failure with hypoxia: Secondary | ICD-10-CM | POA: Diagnosis present

## 2015-11-25 DIAGNOSIS — Z79899 Other long term (current) drug therapy: Secondary | ICD-10-CM | POA: Diagnosis not present

## 2015-11-25 DIAGNOSIS — Z7901 Long term (current) use of anticoagulants: Secondary | ICD-10-CM

## 2015-11-25 DIAGNOSIS — J441 Chronic obstructive pulmonary disease with (acute) exacerbation: Secondary | ICD-10-CM

## 2015-11-25 DIAGNOSIS — R0902 Hypoxemia: Secondary | ICD-10-CM

## 2015-11-25 DIAGNOSIS — I2510 Atherosclerotic heart disease of native coronary artery without angina pectoris: Secondary | ICD-10-CM | POA: Diagnosis present

## 2015-11-25 DIAGNOSIS — Z7951 Long term (current) use of inhaled steroids: Secondary | ICD-10-CM

## 2015-11-25 DIAGNOSIS — I110 Hypertensive heart disease with heart failure: Secondary | ICD-10-CM | POA: Diagnosis present

## 2015-11-25 DIAGNOSIS — E1151 Type 2 diabetes mellitus with diabetic peripheral angiopathy without gangrene: Secondary | ICD-10-CM | POA: Diagnosis present

## 2015-11-25 DIAGNOSIS — Z9981 Dependence on supplemental oxygen: Secondary | ICD-10-CM | POA: Diagnosis not present

## 2015-11-25 DIAGNOSIS — R0602 Shortness of breath: Secondary | ICD-10-CM | POA: Diagnosis not present

## 2015-11-25 DIAGNOSIS — I10 Essential (primary) hypertension: Secondary | ICD-10-CM | POA: Diagnosis present

## 2015-11-25 DIAGNOSIS — E785 Hyperlipidemia, unspecified: Secondary | ICD-10-CM | POA: Diagnosis present

## 2015-11-25 DIAGNOSIS — Z7984 Long term (current) use of oral hypoglycemic drugs: Secondary | ICD-10-CM | POA: Diagnosis not present

## 2015-11-25 DIAGNOSIS — E1165 Type 2 diabetes mellitus with hyperglycemia: Secondary | ICD-10-CM

## 2015-11-25 DIAGNOSIS — J440 Chronic obstructive pulmonary disease with acute lower respiratory infection: Secondary | ICD-10-CM | POA: Diagnosis present

## 2015-11-25 DIAGNOSIS — E039 Hypothyroidism, unspecified: Secondary | ICD-10-CM | POA: Diagnosis present

## 2015-11-25 LAB — COMPREHENSIVE METABOLIC PANEL
ALT: 14 U/L (ref 14–54)
AST: 16 U/L (ref 15–41)
Albumin: 3.7 g/dL (ref 3.5–5.0)
Alkaline Phosphatase: 63 U/L (ref 38–126)
Anion gap: 9 (ref 5–15)
BILIRUBIN TOTAL: 0.7 mg/dL (ref 0.3–1.2)
BUN: 12 mg/dL (ref 6–20)
CALCIUM: 8.6 mg/dL — AB (ref 8.9–10.3)
CO2: 36 mmol/L — AB (ref 22–32)
Chloride: 93 mmol/L — ABNORMAL LOW (ref 101–111)
Creatinine, Ser: 0.76 mg/dL (ref 0.44–1.00)
GFR calc Af Amer: >60 mL/min (ref >60)
GFR calc non Af Amer: >60 mL/min (ref >60)
GLUCOSE: 336 mg/dL — AB (ref 65–99)
Potassium: 3.1 mmol/L — ABNORMAL LOW (ref 3.5–5.1)
Sodium: 138 mmol/L (ref 135–145)
TOTAL PROTEIN: 7 g/dL (ref 6.5–8.1)

## 2015-11-25 LAB — BRAIN NATRIURETIC PEPTIDE: B NATRIURETIC PEPTIDE 5: 218 pg/mL — AB (ref 0.0–100.0)

## 2015-11-25 LAB — CBC WITH DIFFERENTIAL/PLATELET
BASOS PCT: 1 %
Basophils Absolute: 0.1 10*3/uL (ref 0.0–0.1)
EOS PCT: 1 %
Eosinophils Absolute: 0.1 10*3/uL (ref 0.0–0.7)
HEMATOCRIT: 36.2 % (ref 36.0–46.0)
HEMOGLOBIN: 11.3 g/dL — AB (ref 12.0–15.0)
Lymphocytes Relative: 9 %
Lymphs Abs: 0.9 10*3/uL (ref 0.7–4.0)
MCH: 30.1 pg (ref 26.0–34.0)
MCHC: 31.2 g/dL (ref 30.0–36.0)
MCV: 96.3 fL (ref 78.0–100.0)
MONO ABS: 0.6 10*3/uL (ref 0.1–1.0)
MONOS PCT: 6 %
Neutro Abs: 8.6 10*3/uL — ABNORMAL HIGH (ref 1.7–7.7)
Neutrophils Relative %: 83 %
Platelets: 263 10*3/uL (ref 150–400)
RBC: 3.76 MIL/uL — AB (ref 3.87–5.11)
RDW: 13.7 % (ref 11.5–15.5)
WBC: 10.3 10*3/uL (ref 4.0–10.5)

## 2015-11-25 LAB — GLUCOSE, CAPILLARY
GLUCOSE-CAPILLARY: 500 mg/dL — AB (ref 65–99)
Glucose-Capillary: 423 mg/dL — ABNORMAL HIGH (ref 65–99)

## 2015-11-25 LAB — I-STAT CG4 LACTIC ACID, ED: Lactic Acid, Venous: 1.5 mmol/L (ref 0.5–1.9)

## 2015-11-25 LAB — TROPONIN I

## 2015-11-25 LAB — MAGNESIUM: MAGNESIUM: 1.5 mg/dL — AB (ref 1.7–2.4)

## 2015-11-25 LAB — TSH: TSH: 3.424 u[IU]/mL (ref 0.350–4.500)

## 2015-11-25 MED ORDER — LEVALBUTEROL HCL 0.63 MG/3ML IN NEBU
0.6300 mg | INHALATION_SOLUTION | Freq: Four times a day (QID) | RESPIRATORY_TRACT | Status: DC
  Administered 2015-11-25: 0.63 mg via RESPIRATORY_TRACT
  Filled 2015-11-25 (×2): qty 3

## 2015-11-25 MED ORDER — DEXTROSE 5 % IV SOLN
1.0000 g | INTRAVENOUS | Status: DC
  Administered 2015-11-26: 1 g via INTRAVENOUS
  Filled 2015-11-25 (×5): qty 10

## 2015-11-25 MED ORDER — ONDANSETRON HCL 4 MG/2ML IJ SOLN: 4.0000 mg | Freq: Four times a day (QID) | INTRAMUSCULAR | Status: DC | PRN

## 2015-11-25 MED ORDER — APIXABAN 5 MG PO TABS
5.0000 mg | ORAL_TABLET | Freq: Two times a day (BID) | ORAL | Status: DC
  Administered 2015-11-25 – 2015-11-27 (×4): 5 mg via ORAL
  Filled 2015-11-25 (×4): qty 1

## 2015-11-25 MED ORDER — FUROSEMIDE 10 MG/ML IJ SOLN
40.0000 mg | Freq: Two times a day (BID) | INTRAMUSCULAR | Status: DC
  Administered 2015-11-25 – 2015-11-26 (×2): 40 mg via INTRAVENOUS
  Filled 2015-11-25 (×3): qty 4

## 2015-11-25 MED ORDER — METOPROLOL TARTRATE 25 MG PO TABS
25.0000 mg | ORAL_TABLET | Freq: Two times a day (BID) | ORAL | Status: DC
  Administered 2015-11-25 – 2015-11-27 (×4): 25 mg via ORAL
  Filled 2015-11-25 (×4): qty 1

## 2015-11-25 MED ORDER — ROSUVASTATIN CALCIUM 10 MG PO TABS
10.0000 mg | ORAL_TABLET | Freq: Every day | ORAL | Status: DC
  Administered 2015-11-25 – 2015-11-26 (×2): 10 mg via ORAL
  Filled 2015-11-25 (×2): qty 1

## 2015-11-25 MED ORDER — METHYLPREDNISOLONE SODIUM SUCC 40 MG IJ SOLR
40.0000 mg | Freq: Four times a day (QID) | INTRAMUSCULAR | Status: DC
Start: 2015-11-25 — End: 2015-11-26
  Administered 2015-11-25 – 2015-11-26 (×3): 40 mg via INTRAVENOUS
  Filled 2015-11-25 (×3): qty 1

## 2015-11-25 MED ORDER — GLIPIZIDE 5 MG PO TABS
10.0000 mg | ORAL_TABLET | Freq: Two times a day (BID) | ORAL | Status: DC
  Administered 2015-11-25 – 2015-11-27 (×4): 10 mg via ORAL
  Filled 2015-11-25 (×4): qty 2

## 2015-11-25 MED ORDER — LEVOTHYROXINE SODIUM 88 MCG PO TABS
88.0000 ug | ORAL_TABLET | Freq: Every day | ORAL | Status: DC
  Administered 2015-11-26 – 2015-11-27 (×2): 88 ug via ORAL
  Filled 2015-11-25 (×2): qty 1

## 2015-11-25 MED ORDER — DILTIAZEM HCL ER COATED BEADS 180 MG PO CP24
360.0000 mg | ORAL_CAPSULE | Freq: Every day | ORAL | Status: DC
  Administered 2015-11-26 – 2015-11-27 (×2): 360 mg via ORAL
  Filled 2015-11-25 (×2): qty 2

## 2015-11-25 MED ORDER — INSULIN ASPART 100 UNIT/ML ~~LOC~~ SOLN
10.0000 [IU] | Freq: Once | SUBCUTANEOUS | Status: AC
  Administered 2015-11-25: 10 [IU] via SUBCUTANEOUS

## 2015-11-25 MED ORDER — ALBUTEROL SULFATE (2.5 MG/3ML) 0.083% IN NEBU
5.0000 mg | INHALATION_SOLUTION | Freq: Once | RESPIRATORY_TRACT | Status: AC
  Administered 2015-11-25: 5 mg via RESPIRATORY_TRACT
  Filled 2015-11-25: qty 6

## 2015-11-25 MED ORDER — SODIUM CHLORIDE 0.9% FLUSH
3.0000 mL | Freq: Two times a day (BID) | INTRAVENOUS | Status: DC
  Administered 2015-11-25 – 2015-11-27 (×4): 3 mL via INTRAVENOUS

## 2015-11-25 MED ORDER — PANTOPRAZOLE SODIUM 40 MG PO TBEC
40.0000 mg | DELAYED_RELEASE_TABLET | Freq: Every day | ORAL | Status: DC
Start: 2015-11-26 — End: 2015-11-27
  Administered 2015-11-26 – 2015-11-27 (×2): 40 mg via ORAL
  Filled 2015-11-25 (×2): qty 1

## 2015-11-25 MED ORDER — DEXTROSE 5 % IV SOLN
1.0000 g | Freq: Once | INTRAVENOUS | Status: AC
  Administered 2015-11-25: 1 g via INTRAVENOUS
  Filled 2015-11-25: qty 10

## 2015-11-25 MED ORDER — INSULIN ASPART 100 UNIT/ML ~~LOC~~ SOLN
8.0000 [IU] | Freq: Once | SUBCUTANEOUS | Status: AC
  Administered 2015-11-25: 8 [IU] via INTRAVENOUS
  Filled 2015-11-25: qty 1

## 2015-11-25 MED ORDER — DEXTROSE 5 % IV SOLN
500.0000 mg | INTRAVENOUS | Status: DC
  Administered 2015-11-26: 500 mg via INTRAVENOUS
  Filled 2015-11-25 (×5): qty 500

## 2015-11-25 MED ORDER — INSULIN ASPART 100 UNIT/ML ~~LOC~~ SOLN
0.0000 [IU] | Freq: Three times a day (TID) | SUBCUTANEOUS | Status: DC
  Administered 2015-11-25: 20 [IU] via SUBCUTANEOUS
  Administered 2015-11-26: 11 [IU] via SUBCUTANEOUS
  Administered 2015-11-26 (×2): 20 [IU] via SUBCUTANEOUS
  Administered 2015-11-27: 15 [IU] via SUBCUTANEOUS
  Administered 2015-11-27: 20 [IU] via SUBCUTANEOUS

## 2015-11-25 MED ORDER — HYDROCODONE-ACETAMINOPHEN 5-325 MG PO TABS: 1.0000 | ORAL_TABLET | Freq: Two times a day (BID) | ORAL | Status: DC | PRN

## 2015-11-25 MED ORDER — INSULIN ASPART 100 UNIT/ML ~~LOC~~ SOLN
0.0000 [IU] | Freq: Every day | SUBCUTANEOUS | Status: DC
  Administered 2015-11-26: 5 [IU] via SUBCUTANEOUS

## 2015-11-25 MED ORDER — POTASSIUM CHLORIDE CRYS ER 20 MEQ PO TBCR
40.0000 meq | EXTENDED_RELEASE_TABLET | Freq: Once | ORAL | Status: AC
  Administered 2015-11-25: 40 meq via ORAL
  Filled 2015-11-25: qty 2

## 2015-11-25 MED ORDER — POTASSIUM CHLORIDE CRYS ER 20 MEQ PO TBCR
40.0000 meq | EXTENDED_RELEASE_TABLET | Freq: Every day | ORAL | Status: DC
  Administered 2015-11-25 – 2015-11-27 (×3): 40 meq via ORAL
  Filled 2015-11-25 (×3): qty 2

## 2015-11-25 MED ORDER — AZITHROMYCIN 250 MG PO TABS
500.0000 mg | ORAL_TABLET | Freq: Once | ORAL | Status: AC
  Administered 2015-11-25: 500 mg via ORAL
  Filled 2015-11-25: qty 2

## 2015-11-25 MED ORDER — ALPRAZOLAM 0.5 MG PO TABS
0.5000 mg | ORAL_TABLET | Freq: Three times a day (TID) | ORAL | Status: DC | PRN
  Administered 2015-11-25 – 2015-11-26 (×3): 0.5 mg via ORAL
  Filled 2015-11-25 (×3): qty 1

## 2015-11-25 MED ORDER — IOPAMIDOL (ISOVUE-300) INJECTION 61%
75.0000 mL | Freq: Once | INTRAVENOUS | Status: AC | PRN
  Administered 2015-11-25: 75 mL via INTRAVENOUS

## 2015-11-25 MED ORDER — ONDANSETRON HCL 4 MG PO TABS: 4.0000 mg | ORAL_TABLET | Freq: Four times a day (QID) | ORAL | Status: DC | PRN

## 2015-11-25 NOTE — Telephone Encounter (Signed)
Mrs. [name] [name] has had breathing problems all night. History of COPD. Family wanted to let office know that she is calling EMS to take patient to take to [facility].

## 2015-11-25 NOTE — Telephone Encounter (Signed)
Noted  

## 2015-11-25 NOTE — ED Provider Notes (Signed)
[facility]-EMERGENCY DEPT Provider Note   CSN: [id number] Arrival date & time: 11/25/15  1126     History   Chief Complaint Chief Complaint  Patient presents with  . Shortness of Breath    HPI [name] [name] is a 75 y.o. female.  Pt presents to the ED today with SOB.  She said that it has been going on for a few days.  She called EMS who said that her pulse ox on 4L [place] O2 was 88% and when she walked to the truck, her pulse ox was 78%.  Pt was given 125 mg solumedrol and 1 neb en route, and she is breathing a little easier.  Pt also has a hx of a.fib ([place] vasc score of 6 and is on Eliquis).       Past Medical History:  Diagnosis Date  . CAD S/P percutaneous coronary angioplasty 2005, 2012   RCA DES,CFX DES-low risk Nuc June 2016  . Cardiomyopathy (HCC)    EF 65-70% Aug 2016 echo  . Carotid artery disease (HCC)   . Chronic a-fib (HCC)    Eliquis started July 2016  . COPD (chronic obstructive pulmonary disease) (HCC)    Home O2  . Essential hypertension, benign   . Hyperlipidemia   . Hypothyroidism   . Sinus bradycardia    HR 30-40s with associated lightheadedness/presyncoe. AVN blockers held with improvement.  . Type 2 diabetes mellitus [facility](HCC)     Patient Active Problem List   Diagnosis Date Noted  . Diastolic CHF, acute on chronic (HCC)   . Uncontrolled type 2 diabetes mellitus with complication (HCC)   . Uncontrolled type 2 diabetes mellitus with complication, with long-term current use of insulin (HCC)   . Acute on chronic respiratory failure with hypoxia (HCC)   . CAP (community acquired pneumonia)   . Acute on chronic diastolic CHF (congestive heart failure) (HCC)   . Other specified hypothyroidism   . Acute on chronic respiratory failure (HCC) 09/07/2015  . Chronic atrial fibrillation (HCC) 09/07/2015  . Atrial fibrillation with RVR (HCC) 09/07/2015  . Chronic anticoagulation-CHADs VASc=6 09/07/2015  . Type 2 diabetes with decreased circulation (HCC) 09/07/2015    . Sinus bradycardia 09/07/2012  . Paroxysmal atrial fibrillation (HCC) 09/07/2012  . TOBACCO ABUSE 04/09/2009  . CAROTID ARTERY DISEASE 04/09/2009  . COPD with acute exacerbation (HCC) 04/09/2009  . Dyslipidemia 11/20/2008  . Essential hypertension, benign 11/20/2008  . CAD S/P percutaneous coronary angioplasty 11/20/2008    Past Surgical History:  Procedure Laterality Date  . ABDOMINAL HYSTERECTOMY    . APPENDECTOMY    . CHOLECYSTECTOMY    . Excision of Melanoma  04/29/2010   Lower left neck    OB History    No data available       Home Medications    Prior to Admission medications   Medication Sig Start Date End Date Taking? Authorizing Provider  ALPRAZolam Prudy Feeler(XANAX) 0.5 MG tablet Take 0.5 mg by mouth 3 (three) times daily as needed for anxiety.   Yes Historical Provider, MD  dexlansoprazole (DEXILANT) 60 MG capsule Take 60 mg by mouth daily.     Yes Historical Provider, MD  diltiazem (CARDIZEM CD) 360 MG 24 hr capsule Take 1 capsule (360 mg total) by mouth daily. 09/11/15  Yes Drema Dallasurtis J Woods, MD  ELIQUIS 5 MG TABS tablet TAKE 1 TABLET BY MOUTH TWICE DAILY 09/30/14  Yes Rollene RotundaJames Hochrein, MD  Fluticasone Furoate-Vilanterol (BREO ELLIPTA) 100-25 MCG/INH AEPB Inhale 1 puff into the lungs  daily.   Yes Historical Provider, MD  glipiZIDE (GLUCOTROL) 10 MG tablet Take 10 mg by mouth 2 (two) times daily before a meal.    Yes Historical Provider, MD  HYDROcodone-acetaminophen (NORCO/VICODIN) 5-325 MG tablet Take 1 tablet by mouth 2 (two) times daily as needed for moderate pain.   Yes Historical Provider, MD  ipratropium (ATROVENT) 0.02 % nebulizer solution Take 0.5 mg by nebulization every 6 (six) hours as needed for wheezing or shortness of breath.   Yes Historical Provider, MD  levalbuterol (XOPENEX) 0.63 MG/3ML nebulizer solution Take 3 mLs (0.63 mg total) by nebulization every 3 (three) hours as needed for wheezing or shortness of breath. 09/11/15  Yes Drema Dallas, MD  levothyroxine  (SYNTHROID, LEVOTHROID) 88 MCG tablet Take 88 mcg by mouth daily before breakfast.   Yes Historical Provider, MD  metoprolol tartrate (LOPRESSOR) 25 MG tablet Take 25 mg by mouth 2 (two) times daily.   Yes Historical Provider, MD  OXYGEN Inhale 2-3 L into the lungs continuous.   Yes Historical Provider, MD  rosuvastatin (CRESTOR) 10 MG tablet Take 1 tablet (10 mg total) by mouth daily. 10/05/10  Yes June Leap, MD  meclizine (ANTIVERT) 25 MG tablet Take 12.5 mg by mouth daily as needed for dizziness.    Historical Provider, MD  metoprolol (LOPRESSOR) 50 MG tablet Take 1 tablet (50 mg total) by mouth 2 (two) times daily. Patient not taking: Reported on 11/25/2015 10/23/15   Jonelle Sidle, MD  nitroGLYCERIN (NITROSTAT) 0.4 MG SL tablet Place 1 tablet (0.4 mg total) under the tongue every 5 (five) minutes x 3 doses as needed for chest pain. For severe chest pain 08/07/14   Jonelle Sidle, MD    Family History Family History  Problem Relation Age of Onset  . Colon cancer Mother   . Throat cancer Father   . Heart disease Brother     Social History Social History  Substance Use Topics  . Smoking status: Former Smoker    Packs/day: 0.50    Years: 45.00    Types: Cigarettes    Start date: 02/29/1964    Quit date: 06/08/2012  . Smokeless tobacco: Never Used  . Alcohol use No     Allergies   Adalat [nifedipine]; Claritin-d 12 hour [loratadine-pseudoephedrine er]; Codeine; Plavix [clopidogrel bisulfate]; Wellbutrin [bupropion]; Ciprofloxacin; and Metformin and related   Review of Systems Review of Systems  Respiratory: Positive for cough, shortness of breath and wheezing.   Cardiovascular: Positive for palpitations.  All other systems reviewed and are negative.    Physical Exam Updated Vital Signs BP 162/85   Pulse (!) 143   Temp 97.8 F (36.6 C) (Oral)   Resp (!) 33   Ht 5\' 2"  (1.575 m)   Wt 156 lb (70.8 kg)   SpO2 91%   BMI 28.53 kg/m   Physical Exam  Constitutional:  She is oriented to person, place, and time. She appears well-developed and well-nourished.  HENT:  Head: Normocephalic and atraumatic.  Right Ear: External ear normal.  Left Ear: External ear normal.  Nose: Nose normal.  Mouth/Throat: Oropharynx is clear and moist.  Eyes: Conjunctivae and EOM are normal. Pupils are equal, round, and reactive to light.  Neck: Normal range of motion. Neck supple.  Cardiovascular: Intact distal pulses.  An irregularly irregular rhythm present.  Pulmonary/Chest: Tachypnea noted. She has wheezes.  Abdominal: Soft. Bowel sounds are normal.  Musculoskeletal: Normal range of motion.  Neurological: She is alert and  oriented to person, place, and time.  Skin: Skin is warm and dry.  Psychiatric: She has a normal mood and affect. Her behavior is normal. Judgment and thought content normal.  Nursing note and vitals reviewed.    ED Treatments / Results  Labs (all labs ordered are listed, but only abnormal results are displayed) Labs Reviewed  COMPREHENSIVE METABOLIC PANEL - Abnormal; Notable for the following:       Result Value   Potassium 3.1 (*)    Chloride 93 (*)    CO2 36 (*)    Glucose, Bld 336 (*)    Calcium 8.6 (*)    All other components within normal limits  CBC WITH DIFFERENTIAL/PLATELET - Abnormal; Notable for the following:    RBC 3.76 (*)    Hemoglobin 11.3 (*)    Neutro Abs 8.6 (*)    All other components within normal limits  BRAIN NATRIURETIC PEPTIDE - Abnormal; Notable for the following:    B Natriuretic Peptide 218.0 (*)    All other components within normal limits  TROPONIN I  I-STAT CG4 LACTIC ACID, ED    EKG  EKG Interpretation  Date/Time:  Wednesday November 25 2015 11:34:30 EDT Ventricular Rate:  96 PR Interval:    QRS Duration: 96 QT Interval:  362 QTC Calculation: 458 R Axis:   91 Text Interpretation:  Atrial fibrillation with PVC or aberrantly conducted complexes Borderline repolarization abnormality Baseline wander  in lead(s) I nonspecific t wave changes,  No significant change since last tracing Confirmed by KNAPP  MD-J, JON ([id number]) on 11/25/2015 11:41:01 AM       Radiology Dg Chest 2 View  Result Date: 11/25/2015 CLINICAL DATA:  Short of breath for 1 month.  Worse this morning. EXAM: CHEST  2 VIEW COMPARISON:  09/07/2015 and back to 01/12/2015. FINDINGS: Lateral view degraded by patient arm position. Midline trachea. Mild cardiomegaly. Atherosclerosis in the transverse aorta. Small right pleural effusion is similar to on the prior exam. No pneumothorax. No left-sided pleural fluid. Patchy right base airspace disease is slightly decreased since the prior. there may be inferior right upper lobe pulmonary opacity. Clear left lung. Pulmonary interstitial thickening is lower lobe predominant. IMPRESSION: 1. Since 09/07/2015, persistent or recurrent right-sided pleural fluid with improved right base airspace disease. Possible inferior right upper lobe airspace disease. If the patient has infectious symptoms, this could represent recurrent or residual pneumonia. If this is the case, then follow-up radiographs are recommended in 3-4 weeks after appropriate antibiotic therapy to confirm resolution. Especially if there are not infectious symptoms, an underlying obstructive lesion would be a concern and CT (ideally contrast-enhanced) suggested. 2.  Aortic atherosclerosis. 3. Peribronchial thickening which may relate to chronic bronchitis or smoking. Electronically Signed   By: [name] Greaves M.D.   On: 11/25/2015 12:13   Ct Chest W Contrast  Result Date: 11/25/2015 CLINICAL DATA:  Abdominal chest x-ray, wheezing, low oxygen saturation EXAM: CT CHEST WITH CONTRAST TECHNIQUE: Multidetector CT imaging of the chest was performed during intravenous contrast administration. CONTRAST:  75mL ISOVUE-300 IOPAMIDOL (ISOVUE-300) INJECTION 61% COMPARISON:  Chest x-ray of 11/25/2015 and 09/07/2015 FINDINGS: Cardiovascular: The heart is  mildly enlarged. There are diffuse coronary artery calcifications present. No pericardial effusion is seen. The mid ascending thoracic aorta measures 32 mm in diameter. The pulmonary arteries opacify with no acute abnormality. Mediastinum/Nodes: There are somewhat prominent mediastinal lymph nodes present. A pretracheal lymph node measures 8 mm in short axis diameter on image 48 series 2. A precarinal  lymph node measures 16 mm in diameter on image 56. No definite hilar adenopathy is seen. Lungs/Pleura: There are bilateral pleural effusions present right greater than left. Compressive atelectasis is noted primarily in the lower lobes right greater than left. Pneumonia cannot be excluded particularly in the right lower lobe anterolaterally. No suspicious lung nodule or mass is seen. Linear areas of atelectasis or scarring in noted in the right middle lobe and lingula as well. Upper Abdomen: A probable left adrenal adenoma is present. Low-attenuation splenic lesions are present which may represent incidental cysts or hemangiomas but are difficult to assess on this CT of the chest. Musculoskeletal: No compression deformity of the thoracic spine is seen. The sternum is intact. IMPRESSION: 1. Severe thoracic aortic atherosclerosis diffusely. 2. Diffuse coronary artery calcifications. 3. Slightly prominent mediastinal lymph nodes. Possibly inflammatory or infectious, but a neoplastic process cannot be excluded. 4. Probable compressive atelectasis right greater than left at the lung bases, but pneumonia cannot be excluded. 5. Low-attenuation splenic lesions may represent cysts or hemangiomas but are not well assessed on this CT of the chest. 6. Bilateral pleural effusions right greater than left. Electronically Signed   By: Dwyane Dee M.D.   On: 11/25/2015 14:04    Procedures Procedures (including critical care time)  Medications Ordered in ED Medications  cefTRIAXone (ROCEPHIN) 1 g in dextrose 5 % 50 mL IVPB (0 g  Intravenous Stopped 11/25/15 1346)  azithromycin (ZITHROMAX) tablet 500 mg (500 mg Oral Given 11/25/15 1304)  iopamidol (ISOVUE-300) 61 % injection 75 mL (75 mLs Intravenous Contrast Given 11/25/15 1339)  potassium chloride SA (K-DUR,KLOR-CON) CR tablet 40 mEq (40 mEq Oral Given 11/25/15 1347)  insulin aspart (novoLOG) injection 8 Units (8 Units Intravenous Given 11/25/15 1347)  albuterol (PROVENTIL) (2.5 MG/3ML) 0.083% nebulizer solution 5 mg (5 mg Nebulization Given 11/25/15 1415)     Initial Impression / Assessment and Plan / ED Course  I have reviewed the triage vital signs and the nursing notes.  Pertinent labs & imaging results that were available during my care of the patient were reviewed by me and considered in my medical decision making (see chart for details).  Clinical Course   Pt d/w Dr. Conley Rolls (triad) who will admit pt.  Final Clinical Impressions(s) / ED Diagnoses   Final diagnoses:  COPD exacerbation (HCC)  Hypokalemia  Poorly controlled type 2 diabetes mellitus (HCC)  Hypoxia  Pleural effusion, bilateral  CAP (community acquired pneumonia)  Chronic atrial fibrillation [facility])    New Prescriptions New Prescriptions   No medications on file     Jacalyn Lefevre, MD 11/25/15 1456

## 2015-11-25 NOTE — ED Notes (Signed)
Lab at Bedside ? ?

## 2015-11-25 NOTE — H&P (Signed)
History and Physical    [name] NormaShirley O Calabretta ZOX:[id number] DOB: 10/24/1940 DOA: 11/25/2015  PCP: Ignatius Speckinghruv B Vyas, MD  Patient coming from: home.    Chief Complaint:   SOB for a few days.   HPI: [name] [name] is an 75 y.o. female with hx of chornic respiratory failure, on home oxygen, diastolic CHF, seeing Dr Diona BrownerMcDowell, afib on Eliquis for CHADS 6,  CAD, s/p PTCA, last stress test was negative with fix defect June 2016, EF 44 percent, DM , anxiety, lives at home with her son, presented to the ER with SOB for the past several days.   She has no CP, orthopnea or pleuritic CP.  Work up in the ER showed CXR with possible infiltrate and pleural effusion, CT chest confirmed bilateral pleural effusions, R greater than left, and can't exclude PNA.  Her WBC was OK, and her K was 3.1, along with normal Cr.  Her EKG showed no acute ST T changes.  She was given IV Rocephin and Zithromax, increase oxygen content, and IV Steroids, along with nebs, and hospitalist was asked to admit her for COPD exacerbation, possibly CAP, along with Afib, and likely a touch of acute on chronic diastolic CHF.   She maintained stable hemodynamics.   ED Course:  See above.  Rewiew of Systems:  Constitutional: Negative for malaise, fever and chills. No significant weight loss or weight gain Eyes: Negative for eye pain, redness and discharge, diplopia, visual changes, or flashes of light. ENMT: Negative for ear pain, hoarseness, nasal congestion, sinus pressure and sore throat. No headaches; tinnitus, drooling, or problem swallowing. Cardiovascular: Negative for chest pain, palpitations, diaphoresis, and peripheral edema. ; No orthopnea, PND Respiratory: Negative for hemoptysis,  and stridor. No pleuritic chestpain. Gastrointestinal: Negative for diarrhea, constipation,  melena, blood in stool, hematemesis, jaundice and rectal bleeding.    Genitourinary: Negative for frequency, dysuria, incontinence,flank pain and  hematuria; Musculoskeletal: Negative for back pain and neck pain. Negative for swelling and trauma.;  Skin: . Negative for pruritus, rash, abrasions, bruising and skin lesion.; ulcerations Neuro: Negative for headache, lightheadedness and neck stiffness. Negative for weakness, altered level of consciousness , altered mental status, extremity weakness, burning feet, involuntary movement, seizure and syncope.  Psych: negative for anxiety, depression, insomnia, tearfulness, panic attacks, hallucinations, paranoia, suicidal or homicidal ideation   Past Medical History:  Diagnosis Date  . CAD S/P percutaneous coronary angioplasty 2005, 2012   RCA DES,CFX DES-low risk Nuc June 2016  . Cardiomyopathy (HCC)    EF 65-70% Aug 2016 echo  . Carotid artery disease (HCC)   . Chronic a-fib (HCC)    Eliquis started July 2016  . COPD (chronic obstructive pulmonary disease) (HCC)    Home O2  . Essential hypertension, benign   . Hyperlipidemia   . Hypothyroidism   . Sinus bradycardia    HR 30-40s with associated lightheadedness/presyncoe. AVN blockers held with improvement.  . Type 2 diabetes mellitus (HCC)     Rewiew of Systems:  Constitutional: Negative for malaise, fever and chills. No significant weight loss or weight gain Eyes: Negative for eye pain, redness and discharge, diplopia, visual changes, or flashes of light. ENMT: Negative for ear pain, hoarseness, nasal congestion, sinus pressure and sore throat. No headaches; tinnitus, drooling, or problem swallowing. Cardiovascular: Negative for chest pain, palpitations, diaphoresis, dyspnea and peripheral edema. ; No orthopnea, PND Respiratory: Negative for cough, hemoptysis, diffuse wheezing bilaterally but no stridor. No pleuritic chestpain. She has bilateral rales as well.  Gastrointestinal: Negative  for nausea, vomiting, diarrhea, constipation, abdominal pain, melena, blood in stool, hematemesis, jaundice and rectal bleeding.    Genitourinary:  Negative for frequency, dysuria, incontinence,flank pain and hematuria; Musculoskeletal: Negative for back pain and neck pain. Negative for swelling and trauma.;  Skin: . Negative for pruritus, rash, abrasions, bruising and skin lesion.; ulcerations Neuro: Negative for headache, lightheadedness and neck stiffness. Negative for weakness, altered level of consciousness , altered mental status, extremity weakness, burning feet, involuntary movement, seizure and syncope.  Psych: negative for anxiety, depression, insomnia, tearfulness, panic attacks, hallucinations, paranoia, suicidal or homicidal ideation   Past Surgical History:  Procedure Laterality Date  . ABDOMINAL HYSTERECTOMY    . APPENDECTOMY    . CHOLECYSTECTOMY    . Excision of Melanoma  04/29/2010   Lower left neck     reports that she quit smoking about 3 years ago. Her smoking use included Cigarettes. She started smoking about 51 years ago. She has a 22.50 pack-year smoking history. She has never used smokeless tobacco. She reports that she does not drink alcohol or use drugs.  Allergies  Allergen Reactions  . Adalat [Nifedipine] Nausea And Vomiting  . Claritin-D 12 Hour [Loratadine-Pseudoephedrine Er] Nausea And Vomiting  . Codeine     REACTION: stomach upset  . Plavix [Clopidogrel Bisulfate] Other (See Comments)    Legs hurt  . Wellbutrin [Bupropion] Other (See Comments)    nightmares  . Ciprofloxacin Nausea And Vomiting  . Metformin And Related Other (See Comments)    Fatigue      Family History  Problem Relation Age of Onset  . Colon cancer Mother   . Throat cancer Father   . Heart disease Brother      Prior to Admission medications   Medication Sig Start Date End Date Taking? Authorizing Provider  ALPRAZolam Prudy Feeler) 0.5 MG tablet Take 0.5 mg by mouth 3 (three) times daily as needed for anxiety.   Yes Historical Provider, MD  dexlansoprazole (DEXILANT) 60 MG capsule Take 60 mg by mouth daily.     Yes Historical  Provider, MD  diltiazem (CARDIZEM CD) 360 MG 24 hr capsule Take 1 capsule (360 mg total) by mouth daily. 09/11/15  Yes Drema Dallas, MD  ELIQUIS 5 MG TABS tablet TAKE 1 TABLET BY MOUTH TWICE DAILY 09/30/14  Yes Rollene Rotunda, MD  Fluticasone Furoate-Vilanterol (BREO ELLIPTA) 100-25 MCG/INH AEPB Inhale 1 puff into the lungs daily.   Yes Historical Provider, MD  glipiZIDE (GLUCOTROL) 10 MG tablet Take 10 mg by mouth 2 (two) times daily before a meal.    Yes Historical Provider, MD  HYDROcodone-acetaminophen (NORCO/VICODIN) 5-325 MG tablet Take 1 tablet by mouth 2 (two) times daily as needed for moderate pain.   Yes Historical Provider, MD  ipratropium (ATROVENT) 0.02 % nebulizer solution Take 0.5 mg by nebulization every 6 (six) hours as needed for wheezing or shortness of breath.   Yes Historical Provider, MD  levalbuterol (XOPENEX) 0.63 MG/3ML nebulizer solution Take 3 mLs (0.63 mg total) by nebulization every 3 (three) hours as needed for wheezing or shortness of breath. 09/11/15  Yes Drema Dallas, MD  levothyroxine (SYNTHROID, LEVOTHROID) 88 MCG tablet Take 88 mcg by mouth daily before breakfast.   Yes Historical Provider, MD  metoprolol tartrate (LOPRESSOR) 25 MG tablet Take 25 mg by mouth 2 (two) times daily.   Yes Historical Provider, MD  OXYGEN Inhale 2-3 L into the lungs continuous.   Yes Historical Provider, MD  rosuvastatin (CRESTOR) 10 MG tablet Take  1 tablet (10 mg total) by mouth daily. 10/05/10  Yes June Leap, MD  meclizine (ANTIVERT) 25 MG tablet Take 12.5 mg by mouth daily as needed for dizziness.    Historical Provider, MD  metoprolol (LOPRESSOR) 50 MG tablet Take 1 tablet (50 mg total) by mouth 2 (two) times daily. Patient not taking: Reported on 11/25/2015 10/23/15   Jonelle Sidle, MD  nitroGLYCERIN (NITROSTAT) 0.4 MG SL tablet Place 1 tablet (0.4 mg total) under the tongue every 5 (five) minutes x 3 doses as needed for chest pain. For severe chest pain 08/07/14   Jonelle Sidle, MD    Physical Exam: Vitals:   11/25/15 1200 11/25/15 1300 11/25/15 1330 11/25/15 1418  BP: 147/82 135/72 162/85   Pulse: 71 (!) 45 (!) 143   Resp: 25 20 (!) 33   Temp:      TempSrc:      SpO2: 94% 93% 92% 91%  Weight:      Height:          Constitutional: NAD, calm, comfortable Vitals:   11/25/15 1200 11/25/15 1300 11/25/15 1330 11/25/15 1418  BP: 147/82 135/72 162/85   Pulse: 71 (!) 45 (!) 143   Resp: 25 20 (!) 33   Temp:      TempSrc:      SpO2: 94% 93% 92% 91%  Weight:      Height:       Eyes: PERRL, lids and conjunctivae normal ENMT: Mucous membranes are moist. Posterior pharynx clear of any exudate or lesions.Normal dentition.  Neck: normal, supple, no masses, no thyromegaly Respiratory: clear to auscultation bilaterally, no wheezing, no crackles. Normal respiratory effort. No accessory muscle use.  Cardiovascular: Regular rate and rhythm, no murmurs / rubs / gallops. No extremity edema. 2+ pedal pulses. No carotid bruits.  Abdomen: no tenderness, no masses palpated. No hepatosplenomegaly. Bowel sounds positive.  Musculoskeletal: no clubbing / cyanosis. No joint deformity upper and lower extremities. Good ROM, no contractures. Normal muscle tone.  Skin: no rashes, lesions, ulcers. No induration Neurologic: CN 2-12 grossly intact. Sensation intact, DTR normal. Strength 5/5 in all 4.  Psychiatric: Normal judgment and insight. Alert and oriented x 3. Normal mood.     Labs on Admission: I have personally reviewed following labs and imaging studies  CBC:  Recent Labs Lab 11/25/15 1210  WBC 10.3  NEUTROABS 8.6*  HGB 11.3*  HCT 36.2  MCV 96.3  PLT 263   Basic Metabolic Panel:  Recent Labs Lab 11/25/15 1210  NA 138  K 3.1*  CL 93*  CO2 36*  GLUCOSE 336*  BUN 12  CREATININE 0.76  CALCIUM 8.6*   GFR: Estimated Creatinine Clearance: 56.9 mL/min (by C-G formula based on SCr of 0.76 mg/dL). Liver Function Tests:  Recent Labs Lab  11/25/15 1210  AST 16  ALT 14  ALKPHOS 63  BILITOT 0.7  PROT 7.0  ALBUMIN 3.7   Cardiac Enzymes:  Recent Labs Lab 11/25/15 1210  TROPONINI <0.03    Radiological Exams on Admission: Dg Chest 2 View  Result Date: 11/25/2015 CLINICAL DATA:  Short of breath for 1 month.  Worse this morning. EXAM: CHEST  2 VIEW COMPARISON:  09/07/2015 and back to 01/12/2015. FINDINGS: Lateral view degraded by patient arm position. Midline trachea. Mild cardiomegaly. Atherosclerosis in the transverse aorta. Small right pleural effusion is similar to on the prior exam. No pneumothorax. No left-sided pleural fluid. Patchy right base airspace disease is slightly decreased since the  prior. there may be inferior right upper lobe pulmonary opacity. Clear left lung. Pulmonary interstitial thickening is lower lobe predominant. IMPRESSION: 1. Since 09/07/2015, persistent or recurrent right-sided pleural fluid with improved right base airspace disease. Possible inferior right upper lobe airspace disease. If the patient has infectious symptoms, this could represent recurrent or residual pneumonia. If this is the case, then follow-up radiographs are recommended in 3-4 weeks after appropriate antibiotic therapy to confirm resolution. Especially if there are not infectious symptoms, an underlying obstructive lesion would be a concern and CT (ideally contrast-enhanced) suggested. 2.  Aortic atherosclerosis. 3. Peribronchial thickening which may relate to chronic bronchitis or smoking. Electronically Signed   By: [name] Greaves M.D.   On: 11/25/2015 12:13   Ct Chest W Contrast  Result Date: 11/25/2015 CLINICAL DATA:  Abdominal chest x-ray, wheezing, low oxygen saturation EXAM: CT CHEST WITH CONTRAST TECHNIQUE: Multidetector CT imaging of the chest was performed during intravenous contrast administration. CONTRAST:  75mL ISOVUE-300 IOPAMIDOL (ISOVUE-300) INJECTION 61% COMPARISON:  Chest x-ray of 11/25/2015 and 09/07/2015 FINDINGS:  Cardiovascular: The heart is mildly enlarged. There are diffuse coronary artery calcifications present. No pericardial effusion is seen. The mid ascending thoracic aorta measures 32 mm in diameter. The pulmonary arteries opacify with no acute abnormality. Mediastinum/Nodes: There are somewhat prominent mediastinal lymph nodes present. A pretracheal lymph node measures 8 mm in short axis diameter on image 48 series 2. A precarinal lymph node measures 16 mm in diameter on image 56. No definite hilar adenopathy is seen. Lungs/Pleura: There are bilateral pleural effusions present right greater than left. Compressive atelectasis is noted primarily in the lower lobes right greater than left. Pneumonia cannot be excluded particularly in the right lower lobe anterolaterally. No suspicious lung nodule or mass is seen. Linear areas of atelectasis or scarring in noted in the right middle lobe and lingula as well. Upper Abdomen: A probable left adrenal adenoma is present. Low-attenuation splenic lesions are present which may represent incidental cysts or hemangiomas but are difficult to assess on this CT of the chest. Musculoskeletal: No compression deformity of the thoracic spine is seen. The sternum is intact. IMPRESSION: 1. Severe thoracic aortic atherosclerosis diffusely. 2. Diffuse coronary artery calcifications. 3. Slightly prominent mediastinal lymph nodes. Possibly inflammatory or infectious, but a neoplastic process cannot be excluded. 4. Probable compressive atelectasis right greater than left at the lung bases, but pneumonia cannot be excluded. 5. Low-attenuation splenic lesions may represent cysts or hemangiomas but are not well assessed on this CT of the chest. 6. Bilateral pleural effusions right greater than left. Electronically Signed   By: Dwyane Dee M.D.   On: 11/25/2015 14:04    EKG: Independently reviewed.   Assessment/Plan Principal Problem:   COPD with acute exacerbation (HCC) Active Problems:    Essential hypertension, benign   Chronic atrial fibrillation (HCC)   Chronic anticoagulation-CHADs VASc=6   Type 2 diabetes with decreased circulation (HCC)   Acute on chronic respiratory failure with hypoxia (HCC)   CAP (community acquired pneumonia)    PLAN:   COPD Exacerbation, with CAP;  Will continue with IV steroids, nebs with Xopenex, and IV antibiotics including IV rocephin and IV Zithromax.   HTN:  Continue with her meds.  Acute on chronic diastolic CHF:  Will give her IV Lasix BID as well.  Follow Cr and replete K.  Hypokalemia:  Continue with daily K supplement.  Check Mag.  DM:  She will likely have higher CBG due to steroid.  Will provide resistant  scale, and give carb modified diet.   Afib:  Continue with Cardiazem at 360mg  per day.  Reduce Betablocker to 25mg  BID, as she has normal BP, and HR not too rapid.   Hypothyroidism:  Will continue with supplement, and will check TSH.    DVT prophylaxis: On Eliquis Code Status: FULL CODE.  Family Communication: daughter.  Disposition Plan: Home when appropriate.  Consults called: None.  Admission status: Inpatient due to CAP and COPD exacerbation and likely will require 2-3 days of treatment.    Hildagarde Holleran MD FACP. [facility]  If 7PM-7AM, please contact night-coverage [email or web address] Password [email or web address]  11/25/2015, 3:11 PM

## 2015-11-25 NOTE — ED Triage Notes (Signed)
Pt comes in by EMS for shortness of breath. Per EMS, pt was wheezing upon arrival. She walked to the truck and oxygen dropped to 78%. Pt has been given albuterol tx and 125 solumedrol with increase in oxygen to 98%. No distress noted at this time.

## 2015-11-26 LAB — GLUCOSE, RANDOM: GLUCOSE: 315 mg/dL — AB (ref 65–99)

## 2015-11-26 LAB — CBC
HEMATOCRIT: 36.2 % (ref 36.0–46.0)
Hemoglobin: 11.3 g/dL — ABNORMAL LOW (ref 12.0–15.0)
MCH: 29.7 pg (ref 26.0–34.0)
MCHC: 31.2 g/dL (ref 30.0–36.0)
MCV: 95.3 fL (ref 78.0–100.0)
Platelets: 259 10*3/uL (ref 150–400)
RBC: 3.8 MIL/uL — AB (ref 3.87–5.11)
RDW: 13.6 % (ref 11.5–15.5)
WBC: 10.5 10*3/uL (ref 4.0–10.5)

## 2015-11-26 LAB — GLUCOSE, CAPILLARY
Glucose-Capillary: 269 mg/dL — ABNORMAL HIGH (ref 65–99)
Glucose-Capillary: 366 mg/dL — ABNORMAL HIGH (ref 65–99)
Glucose-Capillary: 418 mg/dL — ABNORMAL HIGH (ref 65–99)
Glucose-Capillary: 371 mg/dL — ABNORMAL HIGH (ref 65–99)

## 2015-11-26 LAB — COMPREHENSIVE METABOLIC PANEL
ALT: 12 U/L — ABNORMAL LOW (ref 14–54)
ANION GAP: 8 (ref 5–15)
AST: 14 U/L — AB (ref 15–41)
Albumin: 3.5 g/dL (ref 3.5–5.0)
Alkaline Phosphatase: 56 U/L (ref 38–126)
BILIRUBIN TOTAL: 0.4 mg/dL (ref 0.3–1.2)
BUN: 18 mg/dL (ref 6–20)
CO2: 36 mmol/L — ABNORMAL HIGH (ref 22–32)
Calcium: 8.6 mg/dL — ABNORMAL LOW (ref 8.9–10.3)
Chloride: 95 mmol/L — ABNORMAL LOW (ref 101–111)
Creatinine, Ser: 0.84 mg/dL (ref 0.44–1.00)
Glucose, Bld: 280 mg/dL — ABNORMAL HIGH (ref 65–99)
POTASSIUM: 3.4 mmol/L — AB (ref 3.5–5.1)
Sodium: 139 mmol/L (ref 135–145)
TOTAL PROTEIN: 6.8 g/dL (ref 6.5–8.1)

## 2015-11-26 MED ORDER — LEVALBUTEROL HCL 0.63 MG/3ML IN NEBU: 0.6300 mg | INHALATION_SOLUTION | Freq: Four times a day (QID) | RESPIRATORY_TRACT | Status: DC | PRN

## 2015-11-26 MED ORDER — PREDNISONE 20 MG PO TABS
40.0000 mg | ORAL_TABLET | Freq: Every day | ORAL | Status: DC
  Administered 2015-11-27: 40 mg via ORAL
  Filled 2015-11-26: qty 2

## 2015-11-26 MED ORDER — LEVALBUTEROL HCL 0.63 MG/3ML IN NEBU
0.6300 mg | INHALATION_SOLUTION | Freq: Three times a day (TID) | RESPIRATORY_TRACT | Status: DC
  Administered 2015-11-26 – 2015-11-27 (×4): 0.63 mg via RESPIRATORY_TRACT
  Filled 2015-11-26 (×4): qty 3

## 2015-11-26 MED ORDER — INSULIN REGULAR HUMAN 100 UNIT/ML IJ SOLN
6.0000 [IU] | Freq: Once | INTRAMUSCULAR | Status: AC
  Administered 2015-11-26: 6 [IU] via SUBCUTANEOUS
  Filled 2015-11-26 (×2): qty 0.06

## 2015-11-26 MED ORDER — FUROSEMIDE 40 MG PO TABS
40.0000 mg | ORAL_TABLET | Freq: Every day | ORAL | Status: DC
  Administered 2015-11-27: 40 mg via ORAL
  Filled 2015-11-26: qty 1

## 2015-11-26 NOTE — Progress Notes (Signed)
PROGRESS NOTE    [name] [name]  ZOX:[id number] DOB: 1941-01-02 DOA: 11/25/2015 PCP: Ignatius Specking, MD    Brief Narrative: 74 yo female with hx of afib on Eliquis, DM, HTN, COPD, hypothyroidism, admitted for COPD exacerbation due to CAP.  She was given IV Steroids, nebs, and antibiotics Rocephin and ZIthromax.  She also has mild acute on chronic diastolic dysFx, and did well on IV Lasix.    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 Continue nebs with Xopenex, and IV antibiotics including IV rocephin and IV Zithromax.   HTN:  Continue with her meds.  Acute on chronic diastolic CHF:  She received BID IV lasix 40mg .  She did well.  Will convert to oral Lasix at 40mg  per day.   Hypokalemia:  Continue with daily K supplement.    DM:  She will likely have higher CBG due to steroid.  Will provide resistant scale, and give carb modified diet.   Afib:  Continue with Cardiazem at 360mg  per day.  Reduce Betablocker to 25mg  BID, as she has normal BP, and HR not too rapid.   Hypothyroidism:  Will continue with supplement, and will check TSH.   DVT prophylaxis: Eliquis Code Status: FULL CODE.  Family Communication: daughter.  Disposition Plan: To home when appropriate.   Consultants:   None.   Procedures:   None.   Antimicrobials: Anti-infectives    Start     Dose/Rate Route Frequency Ordered Stop   11/26/15 1300  cefTRIAXone (ROCEPHIN) 1 g in dextrose 5 % 50 mL IVPB     1 g 100 mL/hr over 30 Minutes Intravenous Every 24 hours 11/25/15 1642     11/26/15 1200  azithromycin  (ZITHROMAX) 500 mg in dextrose 5 % 250 mL IVPB     500 mg 250 mL/hr over 60 Minutes Intravenous Every 24 hours 11/25/15 1642     11/25/15 1300  cefTRIAXone (ROCEPHIN) 1 g in dextrose 5 % 50 mL IVPB     1 g 100 mL/hr over 30 Minutes Intravenous  Once 11/25/15 1250 11/25/15 1346   11/25/15 1300  azithromycin (ZITHROMAX) tablet 500 mg     500 mg Oral  Once 11/25/15 1250 11/25/15 1304       Subjective:  Feeling much better.   Objective: Vitals:   11/26/15 0700 11/26/15 0757 11/26/15 1300 11/26/15 1425  BP: (!) 158/72  120/63   Pulse: 98  99   Resp: 20  20   Temp: 97.5 F (36.4 C)  98.3 F (36.8 C)   TempSrc: Oral  Oral   SpO2: 93% 94% 94% 97%  Weight:      Height:        Intake/Output Summary (Last 24 hours) at 11/26/15 1602 Last data filed at 11/26/15 1200  Gross per 24 hour  Intake              480 ml  Output                0 ml  Net  480 ml   Filed Weights   11/25/15 1129  Weight: 70.8 kg (156 lb)    Examination:  General exam: Appears calm and comfortable  Respiratory system: Clear to auscultation. Respiratory effort normal. Cardiovascular system: S1 & S2 heard, RRR. No JVD, murmurs, rubs, gallops or clicks. No pedal edema. Gastrointestinal system: Abdomen is nondistended, soft and nontender. No organomegaly or masses felt. Normal bowel sounds heard. Central nervous system: Alert and oriented. No focal neurological deficits. Extremities: Symmetric 5 x 5 power. Skin: No rashes, lesions or ulcers Psychiatry: Judgement and insight appear normal. Mood & affect appropriate.   Data Reviewed: I have personally reviewed following labs and imaging studies  CBC:  Recent Labs Lab 11/25/15 1210 11/26/15 0559  WBC 10.3 10.5  NEUTROABS 8.6*  --   HGB 11.3* 11.3*  HCT 36.2 36.2  MCV 96.3 95.3  PLT 263 259   Basic Metabolic Panel:  Recent Labs Lab 11/25/15 1210 11/26/15 0559  NA 138 139  K 3.1* 3.4*  CL 93* 95*  CO2 36* 36*  GLUCOSE 336* 280*    BUN 12 18  CREATININE 0.76 0.84  CALCIUM 8.6* 8.6*  MG 1.5*  --    GFR: Estimated Creatinine Clearance: 54.2 mL/min (by C-G formula based on SCr of 0.84 mg/dL). Liver Function Tests:  Recent Labs Lab 11/25/15 1210 11/26/15 0559  AST 16 14*  ALT 14 12*  ALKPHOS 63 56  BILITOT 0.7 0.4  PROT 7.0 6.8  ALBUMIN 3.7 3.5   Cardiac Enzymes:  Recent Labs Lab 11/25/15 1210  TROPONINI <0.03   CBG:  Recent Labs Lab 11/25/15 1650 11/25/15 2129 11/26/15 0710 11/26/15 1117  GLUCAP 423* 500* 269* 366*   Lipid Profile: Thyroid Function Tests:  Recent Labs  11/25/15 1210  TSH 3.424   Sepsis Labs:  Recent Labs Lab 11/25/15 1225  LATICACIDVEN 1.50    No results found for this or any previous visit (from the past 240 hour(s)).   Radiology Studies: Dg Chest 2 View  Result Date: 11/25/2015 CLINICAL DATA:  Short of breath for 1 month.  Worse this morning. EXAM: CHEST  2 VIEW COMPARISON:  09/07/2015 and back to 01/12/2015. FINDINGS: Lateral view degraded by patient arm position. Midline trachea. Mild cardiomegaly. Atherosclerosis in the transverse aorta. Small right pleural effusion is similar to on the prior exam. No pneumothorax. No left-sided pleural fluid. Patchy right base airspace disease is slightly decreased since the prior. there may be inferior right upper lobe pulmonary opacity. Clear left lung. Pulmonary interstitial thickening is lower lobe predominant. IMPRESSION: 1. Since 09/07/2015, persistent or recurrent right-sided pleural fluid with improved right base airspace disease. Possible inferior right upper lobe airspace disease. If the patient has infectious symptoms, this could represent recurrent or residual pneumonia. If this is the case, then follow-up radiographs are recommended in 3-4 weeks after appropriate antibiotic therapy to confirm resolution. Especially if there are not infectious symptoms, an underlying obstructive lesion would be a concern and CT (ideally  contrast-enhanced) suggested. 2.  Aortic atherosclerosis. 3. Peribronchial thickening which may relate to chronic bronchitis or smoking. Electronically Signed   By: Jeronimo Greaves M.D.   On: 11/25/2015 12:13   Ct Chest W Contrast  Result Date: 11/25/2015 CLINICAL DATA:  Abdominal chest x-ray, wheezing, low oxygen saturation EXAM: CT CHEST WITH CONTRAST TECHNIQUE: Multidetector CT imaging of the chest was performed during intravenous contrast administration. CONTRAST:  75mL ISOVUE-300 IOPAMIDOL (ISOVUE-300) INJECTION 61% COMPARISON:  Chest x-ray of 11/25/2015 and 09/07/2015 FINDINGS: Cardiovascular: The  heart is mildly enlarged. There are diffuse coronary artery calcifications present. No pericardial effusion is seen. The mid ascending thoracic aorta measures 32 mm in diameter. The pulmonary arteries opacify with no acute abnormality. Mediastinum/Nodes: There are somewhat prominent mediastinal lymph nodes present. A pretracheal lymph node measures 8 mm in short axis diameter on image 48 series 2. A precarinal lymph node measures 16 mm in diameter on image 56. No definite hilar adenopathy is seen. Lungs/Pleura: There are bilateral pleural effusions present right greater than left. Compressive atelectasis is noted primarily in the lower lobes right greater than left. Pneumonia cannot be excluded particularly in the right lower lobe anterolaterally. No suspicious lung nodule or mass is seen. Linear areas of atelectasis or scarring in noted in the right middle lobe and lingula as well. Upper Abdomen: A probable left adrenal adenoma is present. Low-attenuation splenic lesions are present which may represent incidental cysts or hemangiomas but are difficult to assess on this CT of the chest. Musculoskeletal: No compression deformity of the thoracic spine is seen. The sternum is intact. IMPRESSION: 1. Severe thoracic aortic atherosclerosis diffusely. 2. Diffuse coronary artery calcifications. 3. Slightly prominent  mediastinal lymph nodes. Possibly inflammatory or infectious, but a neoplastic process cannot be excluded. 4. Probable compressive atelectasis right greater than left at the lung bases, but pneumonia cannot be excluded. 5. Low-attenuation splenic lesions may represent cysts or hemangiomas but are not well assessed on this CT of the chest. 6. Bilateral pleural effusions right greater than left. Electronically Signed   By: Dwyane DeePaul  Barry M.D.   On: 11/25/2015 14:04    Scheduled Meds: . apixaban  5 mg Oral BID  . azithromycin  500 mg Intravenous Q24H  . cefTRIAXone (ROCEPHIN)  IV  1 g Intravenous Q24H  . diltiazem  360 mg Oral Daily  . furosemide  40 mg Oral Daily  . glipiZIDE  10 mg Oral BID AC  . insulin aspart  0-20 Units Subcutaneous TID WC  . insulin aspart  0-5 Units Subcutaneous QHS  . levalbuterol  0.63 mg Nebulization TID  . levothyroxine  88 mcg Oral QAC breakfast  . metoprolol  25 mg Oral BID  . pantoprazole  40 mg Oral Daily  . potassium chloride  40 mEq Oral Daily  . [START ON 11/27/2015] predniSONE  40 mg Oral QAC breakfast  . rosuvastatin  10 mg Oral q1800  . sodium chloride flush  3 mL Intravenous Q12H   Continuous Infusions:    LOS: 1 day   Fabyan Loughmiller, MD FACP Hospitalist.   If 7PM-7AM, please contact night-coverage [email or web address] Password [email or web address] 11/26/2015, 4:02 PM

## 2015-11-26 NOTE — Care Management Note (Signed)
Case Management Note  Patient Details  Name: [name] [name] MRN: [id number] Date of Birth: 03/18/1940  Subjective/Objective:                  Pt admitted with COPD, ?CAP, ?CHF. Pt is from home, lives with her son and is ind with ADL's. She was driving herself to appointments prior to getting sick. She has continuous oxygen at home 2-3 LPM. She has cane, walker, and BSC she uses as needed. She has used [facility] in the past but is not active now. If she needs a nurse for f/u after DC she would like to use [facility] again. She would like it discussed at the time of DC.   Action/Plan: Will cont to follow for DC planning.   Expected Discharge Date:     11/28/2015             Expected Discharge Plan:  Home/Self Care  In-House Referral:  NA  Discharge planning Services  CM Consult  Post Acute Care Choice:  NA Choice offered to:  NA  DME Arranged:    DME Agency:     HH Arranged:    HH Agency:     Status of Service:  In process, will continue to follow  If discussed at Long Length of Stay Meetings, dates discussed:    Additional Comments:  Malcolm MetroChildress, Rucha Wissinger Demske, RN 11/26/2015, 2:58 PM

## 2015-11-27 LAB — GLUCOSE, CAPILLARY
GLUCOSE-CAPILLARY: 328 mg/dL — AB (ref 65–99)
Glucose-Capillary: 420 mg/dL — ABNORMAL HIGH (ref 65–99)

## 2015-11-27 MED ORDER — FUROSEMIDE 40 MG PO TABS: 40.0000 mg | ORAL_TABLET | Freq: Every day | ORAL | 1 refills | Status: DC

## 2015-11-27 MED ORDER — PREDNISONE 20 MG PO TABS: 40.0000 mg | ORAL_TABLET | Freq: Every day | ORAL | 0 refills | Status: DC

## 2015-11-27 MED ORDER — AZITHROMYCIN 250 MG PO TABS: ORAL_TABLET | ORAL | 0 refills | Status: DC

## 2015-11-27 NOTE — Care Management Important Message (Signed)
Important Message  Patient Details  Name: [name] [name] MRN: [id number] Date of Birth: 12/15/1940   Medicare Important Message Given:  Yes    Malcolm MetroChildress, Katelinn Justice Demske, RN 11/27/2015, 1:26 PM

## 2015-11-27 NOTE — Care Management Note (Signed)
Case Management Note  Patient Details  Name: [name] [name] MRN: [id number] Date of Birth: 05/23/1940  Expected Discharge Date:  11/27/15               Expected Discharge Plan:  Home/Self Care  In-House Referral:  NA  Discharge planning Services  CM Consult  Post Acute Care Choice:  NA Choice offered to:  NA  DME Arranged:    DME Agency:     HH Arranged:    HH Agency:     Status of Service:  Completed, signed off  If discussed at MicrosoftLong Length of Stay Meetings, dates discussed:    Additional Comments: Pt discharged home today with self care. [facility] at [facility].   Malcolm Metrohildress, Javiel Canepa Demske, RN 11/27/2015, 1:26 PM

## 2015-11-27 NOTE — Progress Notes (Signed)
Pt's CBG 420. Received verbal order from MD to administer 20 units novolog.

## 2015-11-27 NOTE — Progress Notes (Signed)
Inpatient Diabetes Program Recommendations  AACE/ADA: New Consensus Statement on Inpatient Glycemic Control (2015)  Target Ranges:  Prepandial:   less than 140 mg/dL      Peak postprandial:   less than 180 mg/dL (1-2 hours)      Critically ill patients:  140 - 180 mg/dL   Lab Results  Component Value Date   GLUCAP 328 (H) 11/27/2015   HGBA1C 8.4 (H) 09/09/2015    Review of Glycemic Control  Diabetes history: DM 2 Outpatient Diabetes medications: Glipizide 10 mg BID Current orders for Inpatient glycemic control: Glipizide 10 mg BID, Novolog Resistant + HS scale  Inpatient Diabetes Program Recommendations:   Patient transitioning to PO prednisone from IV Solumedrol. Glucose currently in the 300-400 range. Consider giving basal insulin for 1-2 days. Consider 0.1 units/kg, Lantus 8-10 units Daily.   Thanks,   Christena DeemShannon Novalynn Branaman RN, MSN, [facility] Inpatient Diabetes Coordinator Team Pager [phone] (8a-5p)

## 2015-11-27 NOTE — Progress Notes (Signed)
Pt's IV catheter removed and intact. Pt's IV site clean dry and intact. Discharge instructions including medications and follow up appointments were reviewed and discussed with patient. All questions were answered and no further questions at this time. Pt verbalized understanding of discharge instructions including medications and follow up appointments. Pt in stable condition and in no acute distress at time of discharge. Pt will be escorted by nurse tech.  

## 2015-11-27 NOTE — Discharge Summary (Signed)
Physician Discharge Summary  [name] [name]:[id number]:[id number] DOB: 03/28/1940 DOA: 11/25/2015  PCP: Ignatius Speckinghruv B Vyas, MD  Admit date: 11/25/2015 Discharge date: 11/27/2015  Admitted From: Home.  Disposition:  Home.   Recommendations for Outpatient Follow-up:  1. Follow up with PCP in 1-2 weeks  Home Health: None.  Equipment/Devices: None.  Discharge Condition: Better.  CODE STATUS:  Diet recommendation:  Carb modified diet and cardiac heathy.   Brief/Interim Summary: patient was admitted by me for COPD exacerbation, queried CAP, and mild acute on chronic diastolic CHF. As per my prior H and P "  " [name] NormaShirley O [name] is an 75 y.o. female with hx of chornic respiratory failure, on home oxygen, diastolic CHF, seeing Dr Diona BrownerMcDowell, afib on Eliquis for CHADS 6,  CAD, s/p PTCA, last stress test was negative with fix defect June 2016, EF 44 percent, DM , anxiety, lives at home with her son, presented to the ER with SOB for the past several days.   She has no CP, orthopnea or pleuritic CP.  Work up in the ER showed CXR with possible infiltrate and pleural effusion, CT chest confirmed bilateral pleural effusions, R greater than left, and can't exclude PNA.  Her WBC was OK, and her K was 3.1, along with normal Cr.  Her EKG showed no acute ST T changes.  She was given IV Rocephin and Zithromax, increase oxygen content, and IV Steroids, along with nebs, and hospitalist was asked to admit her for COPD exacerbation, possibly CAP, along with Afib, and likely a touch of acute on chronic diastolic CHF.   She maintained stable hemodynamics.   HOSPITAL COURSE:  Patient was admitted into the hospital, and her IV steroids, neb with Xopenex and IV antibiotics were continued.  She improved with her breathing and wheezing overnight.  The following day, her IV meds were converted to Prednisone, and she was observed.  She was also given IV lasix, and her lung exam improved as well.  She felt well and would like to go home, and it was  felt she is stable for discharge.  She has home oxygen already.  For her afib, her meds were continued, and her anticoauglation was continued with Eliquis.  She did not manifest with any adrenal insufficiency symptoms, and therefore, her prednisone was given with rapid taper.  She will finish her antibiotic with a Z pak. She will see her PCP next week for follow up.  Thank you for allowing me to participate in her care.  Good Day.   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  See your doctor for follow up in one week.   Increase activity slowly    Complete by:  As directed        Medication List    STOP taking these medications   ipratropium 0.02 % nebulizer solution Commonly known as:  ATROVENT     TAKE these medications   ALPRAZolam 0.5 MG tablet Commonly  known as:  XANAX Take 0.5 mg by mouth 3 (three) times daily as needed for anxiety.   azithromycin 250 MG tablet Commonly known as:  ZITHROMAX Z-PAK Use as directed.   BREO ELLIPTA 100-25 MCG/INH Aepb Generic drug:  fluticasone furoate-vilanterol Inhale 1 puff into the lungs daily.   DEXILANT 60 MG capsule Generic drug:  dexlansoprazole Take 60 mg by mouth daily.   diltiazem 360 MG 24 hr capsule Commonly known as:  CARDIZEM CD Take 1 capsule (360 mg total) by mouth daily.   ELIQUIS 5 MG Tabs tablet Generic drug:  apixaban TAKE 1 TABLET BY MOUTH TWICE DAILY   furosemide 40 MG tablet Commonly known as:   LASIX Take 1 tablet (40 mg total) by mouth daily. Start taking on:  11/28/2015   glipiZIDE 10 MG tablet Commonly known as:  GLUCOTROL Take 10 mg by mouth 2 (two) times daily before a meal.   HYDROcodone-acetaminophen 5-325 MG tablet Commonly known as:  NORCO/VICODIN Take 1 tablet by mouth 2 (two) times daily as needed for moderate pain.   levalbuterol 0.63 MG/3ML nebulizer solution Commonly known as:  XOPENEX Take 3 mLs (0.63 mg total) by nebulization every 3 (three) hours as needed for wheezing or shortness of breath.   levothyroxine 88 MCG tablet Commonly known as:  SYNTHROID, LEVOTHROID Take 88 mcg by mouth daily before breakfast.   meclizine 25 MG tablet Commonly known as:  ANTIVERT Take 12.5 mg by mouth daily as needed for dizziness.   metoprolol tartrate 25 MG tablet Commonly known as:  LOPRESSOR Take 25 mg by mouth 2 (two) times daily. What changed:  Another medication with the same name was removed. Continue taking this medication, and follow the directions you see here.   nitroGLYCERIN 0.4 MG SL tablet Commonly known as:  NITROSTAT Place 1 tablet (0.4 mg total) under the tongue every 5 (five) minutes x 3 doses as needed for chest pain. For severe chest pain   OXYGEN Inhale 2-3 L into the lungs continuous.   predniSONE 20 MG tablet Commonly known as:  DELTASONE Take 2 tablets (40 mg total) by mouth daily before breakfast. Start taking on:  11/28/2015   rosuvastatin 10 MG tablet Commonly known as:  CRESTOR Take 1 tablet (10 mg total) by mouth daily.       Allergies  Allergen Reactions  . Adalat [Nifedipine] Nausea And Vomiting  . Claritin-D 12 Hour [Loratadine-Pseudoephedrine Er] Nausea And Vomiting  . Codeine     REACTION: stomach upset  . Plavix [Clopidogrel Bisulfate] Other (See Comments)    Legs hurt  . Wellbutrin [Bupropion] Other (See Comments)    nightmares  . Ciprofloxacin Nausea And Vomiting  . Metformin And Related Other (See Comments)     Fatigue      Consultations:  None.   Procedures/Studies: Dg Chest 2 View  Result Date: 11/25/2015 CLINICAL DATA:  Short of breath for 1 month.  Worse this morning. EXAM: CHEST  2 VIEW COMPARISON:  09/07/2015 and back to 01/12/2015. FINDINGS: Lateral view degraded by patient arm position. Midline trachea. Mild cardiomegaly. Atherosclerosis in the transverse aorta. Small right pleural effusion is similar to on the prior exam. No pneumothorax. No left-sided pleural fluid. Patchy right base airspace disease is slightly decreased since the prior. there may be inferior right upper lobe pulmonary opacity. Clear left lung. Pulmonary interstitial thickening is lower lobe predominant. IMPRESSION: 1. Since 09/07/2015, persistent or recurrent right-sided pleural fluid with improved right base airspace disease. Possible inferior right upper  lobe airspace disease. If the patient has infectious symptoms, this could represent recurrent or residual pneumonia. If this is the case, then follow-up radiographs are recommended in 3-4 weeks after appropriate antibiotic therapy to confirm resolution. Especially if there are not infectious symptoms, an underlying obstructive lesion would be a concern and CT (ideally contrast-enhanced) suggested. 2.  Aortic atherosclerosis. 3. Peribronchial thickening which may relate to chronic bronchitis or smoking. Electronically Signed   By: [name] Greaves M.D.   On: 11/25/2015 12:13   Ct Chest W Contrast  Result Date: 11/25/2015 CLINICAL DATA:  Abdominal chest x-ray, wheezing, low oxygen saturation EXAM: CT CHEST WITH CONTRAST TECHNIQUE: Multidetector CT imaging of the chest was performed during intravenous contrast administration. CONTRAST:  75mL ISOVUE-300 IOPAMIDOL (ISOVUE-300) INJECTION 61% COMPARISON:  Chest x-ray of 11/25/2015 and 09/07/2015 FINDINGS: Cardiovascular: The heart is mildly enlarged. There are diffuse coronary artery calcifications present. No pericardial effusion is  seen. The mid ascending thoracic aorta measures 32 mm in diameter. The pulmonary arteries opacify with no acute abnormality. Mediastinum/Nodes: There are somewhat prominent mediastinal lymph nodes present. A pretracheal lymph node measures 8 mm in short axis diameter on image 48 series 2. A precarinal lymph node measures 16 mm in diameter on image 56. No definite hilar adenopathy is seen. Lungs/Pleura: There are bilateral pleural effusions present right greater than left. Compressive atelectasis is noted primarily in the lower lobes right greater than left. Pneumonia cannot be excluded particularly in the right lower lobe anterolaterally. No suspicious lung nodule or mass is seen. Linear areas of atelectasis or scarring in noted in the right middle lobe and lingula as well. Upper Abdomen: A probable left adrenal adenoma is present. Low-attenuation splenic lesions are present which may represent incidental cysts or hemangiomas but are difficult to assess on this CT of the chest. Musculoskeletal: No compression deformity of the thoracic spine is seen. The sternum is intact. IMPRESSION: 1. Severe thoracic aortic atherosclerosis diffusely. 2. Diffuse coronary artery calcifications. 3. Slightly prominent mediastinal lymph nodes. Possibly inflammatory or infectious, but a neoplastic process cannot be excluded. 4. Probable compressive atelectasis right greater than left at the lung bases, but pneumonia cannot be excluded. 5. Low-attenuation splenic lesions may represent cysts or hemangiomas but are not well assessed on this CT of the chest. 6. Bilateral pleural effusions right greater than left. Electronically Signed   By: Dwyane Dee M.D.   On: 11/25/2015 14:04     Subjective:  Feels well.   Discharge Exam: Vitals:   11/27/15 0624 11/27/15 1032  BP: (!) 117/54 (!) 131/54  Pulse: 71 84  Resp: 20   Temp: 97.5 F (36.4 C)    Vitals:   11/26/15 1954 11/27/15 0624 11/27/15 0747 11/27/15 1032  BP: (!) 127/49  (!) 117/54  (!) 131/54  Pulse: 90 71  84  Resp: 20 20    Temp: 97.7 F (36.5 C) 97.5 F (36.4 C)    TempSrc: Oral Oral    SpO2: 96% 100% 98%   Weight:      Height:        General: Pt is alert, awake, not in acute distress Cardiovascular: RRR, S1/S2 +, no rubs, no gallops Respiratory: CTA bilaterally, no wheezing, no rhonchi Abdominal: Soft, NT, ND, bowel sounds + Extremities: no edema, no cyanosis    The results of significant diagnostics from this hospitalization (including imaging, microbiology, ancillary and laboratory) are listed below for reference.    Labs: BNP (last 3 results)  Recent Labs  09/07/15 1228  11/25/15 1210  BNP 297.0* 218.0*   Basic Metabolic Panel:  Recent Labs Lab 11/25/15 1210 11/26/15 0559 11/26/15 1711  NA 138 139  --   K 3.1* 3.4*  --   CL 93* 95*  --   CO2 36* 36*  --   GLUCOSE 336* 280* 315*  BUN 12 18  --   CREATININE 0.76 0.84  --   CALCIUM 8.6* 8.6*  --   MG 1.5*  --   --    Liver Function Tests:  Recent Labs Lab 11/25/15 1210 11/26/15 0559  AST 16 14*  ALT 14 12*  ALKPHOS 63 56  BILITOT 0.7 0.4  PROT 7.0 6.8  ALBUMIN 3.7 3.5   CBC:  Recent Labs Lab 11/25/15 1210 11/26/15 0559  WBC 10.3 10.5  NEUTROABS 8.6*  --   HGB 11.3* 11.3*  HCT 36.2 36.2  MCV 96.3 95.3  PLT 263 259   Cardiac Enzymes:  Recent Labs Lab 11/25/15 1210  TROPONINI <0.03   BNP: CBG:  Recent Labs Lab 11/26/15 0710 11/26/15 1117 11/26/15 1610 11/26/15 2130 11/27/15 0740  GLUCAP 269* 366* 418* 371* 328*   Thyroid function studies  Recent Labs  11/25/15 1210  TSH 3.424   Time coordinating discharge: Over 30 minutes  SIGNED:  Houston Siren, MD FACP [facility] 11/27/2015, 11:19 AM   If 7PM-7AM, please contact night-coverage [email or web address] Password [email or web address]

## 2015-12-09 ENCOUNTER — Emergency Department (HOSPITAL_COMMUNITY): Payer: Medicare Other

## 2015-12-09 ENCOUNTER — Encounter (HOSPITAL_COMMUNITY): Payer: Self-pay | Admitting: Emergency Medicine

## 2015-12-09 ENCOUNTER — Inpatient Hospital Stay (HOSPITAL_COMMUNITY)
Admission: EM | Admit: 2015-12-09 | Discharge: 2015-12-11 | DRG: 190 | Disposition: A | Discharge status: Home / Self Care | Payer: Medicare Other | Attending: Internal Medicine | Admitting: Internal Medicine

## 2015-12-09 DIAGNOSIS — R0602 Shortness of breath: Secondary | ICD-10-CM | POA: Diagnosis present

## 2015-12-09 DIAGNOSIS — E038 Other specified hypothyroidism: Secondary | ICD-10-CM

## 2015-12-09 DIAGNOSIS — J441 Chronic obstructive pulmonary disease with (acute) exacerbation: Principal | ICD-10-CM

## 2015-12-09 DIAGNOSIS — E039 Hypothyroidism, unspecified: Secondary | ICD-10-CM | POA: Diagnosis present

## 2015-12-09 DIAGNOSIS — Z7901 Long term (current) use of anticoagulants: Secondary | ICD-10-CM | POA: Diagnosis not present

## 2015-12-09 DIAGNOSIS — E785 Hyperlipidemia, unspecified: Secondary | ICD-10-CM | POA: Diagnosis present

## 2015-12-09 DIAGNOSIS — E118 Type 2 diabetes mellitus with unspecified complications: Secondary | ICD-10-CM

## 2015-12-09 DIAGNOSIS — I10 Essential (primary) hypertension: Secondary | ICD-10-CM

## 2015-12-09 DIAGNOSIS — D649 Anemia, unspecified: Secondary | ICD-10-CM | POA: Diagnosis present

## 2015-12-09 DIAGNOSIS — E1165 Type 2 diabetes mellitus with hyperglycemia: Secondary | ICD-10-CM | POA: Diagnosis present

## 2015-12-09 DIAGNOSIS — Z79899 Other long term (current) drug therapy: Secondary | ICD-10-CM | POA: Diagnosis not present

## 2015-12-09 DIAGNOSIS — I482 Chronic atrial fibrillation, unspecified: Secondary | ICD-10-CM | POA: Diagnosis present

## 2015-12-09 DIAGNOSIS — Z7984 Long term (current) use of oral hypoglycemic drugs: Secondary | ICD-10-CM

## 2015-12-09 DIAGNOSIS — J449 Chronic obstructive pulmonary disease, unspecified: Secondary | ICD-10-CM | POA: Insufficient documentation

## 2015-12-09 DIAGNOSIS — Z85820 Personal history of malignant melanoma of skin: Secondary | ICD-10-CM | POA: Diagnosis not present

## 2015-12-09 DIAGNOSIS — J9621 Acute and chronic respiratory failure with hypoxia: Secondary | ICD-10-CM | POA: Diagnosis present

## 2015-12-09 DIAGNOSIS — Z9981 Dependence on supplemental oxygen: Secondary | ICD-10-CM | POA: Diagnosis not present

## 2015-12-09 DIAGNOSIS — IMO0002 Reserved for concepts with insufficient information to code with codable children: Secondary | ICD-10-CM

## 2015-12-09 DIAGNOSIS — Z7952 Long term (current) use of systemic steroids: Secondary | ICD-10-CM | POA: Diagnosis not present

## 2015-12-09 DIAGNOSIS — J9620 Acute and chronic respiratory failure, unspecified whether with hypoxia or hypercapnia: Secondary | ICD-10-CM

## 2015-12-09 DIAGNOSIS — Z955 Presence of coronary angioplasty implant and graft: Secondary | ICD-10-CM | POA: Diagnosis not present

## 2015-12-09 DIAGNOSIS — Z9861 Coronary angioplasty status: Secondary | ICD-10-CM

## 2015-12-09 DIAGNOSIS — Z87891 Personal history of nicotine dependence: Secondary | ICD-10-CM

## 2015-12-09 DIAGNOSIS — M6281 Muscle weakness (generalized): Secondary | ICD-10-CM

## 2015-12-09 DIAGNOSIS — I2510 Atherosclerotic heart disease of native coronary artery without angina pectoris: Secondary | ICD-10-CM | POA: Diagnosis present

## 2015-12-09 LAB — BASIC METABOLIC PANEL
Anion gap: 9 (ref 5–15)
BUN: 17 mg/dL (ref 6–20)
CHLORIDE: 98 mmol/L — AB (ref 101–111)
CO2: 28 mmol/L (ref 22–32)
CREATININE: 0.81 mg/dL (ref 0.44–1.00)
Calcium: 9.2 mg/dL (ref 8.9–10.3)
GFR calc non Af Amer: >60 mL/min (ref >60)
Glucose, Bld: 370 mg/dL — ABNORMAL HIGH (ref 65–99)
POTASSIUM: 4.6 mmol/L (ref 3.5–5.1)
SODIUM: 135 mmol/L (ref 135–145)

## 2015-12-09 LAB — CBC WITH DIFFERENTIAL/PLATELET
BASOS PCT: 0 %
Basophils Absolute: 0 10*3/uL (ref 0.0–0.1)
Eosinophils Absolute: 0 10*3/uL (ref 0.0–0.7)
Eosinophils Relative: 0 %
HEMATOCRIT: 37.6 % (ref 36.0–46.0)
HEMOGLOBIN: 11.8 g/dL — AB (ref 12.0–15.0)
LYMPHS ABS: 0.6 10*3/uL — AB (ref 0.7–4.0)
Lymphocytes Relative: 4 %
MCH: 30.3 pg (ref 26.0–34.0)
MCHC: 31.4 g/dL (ref 30.0–36.0)
MCV: 96.4 fL (ref 78.0–100.0)
MONOS PCT: 2 %
Monocytes Absolute: 0.3 10*3/uL (ref 0.1–1.0)
NEUTROS ABS: 14.2 10*3/uL — AB (ref 1.7–7.7)
NEUTROS PCT: 94 %
Platelets: 270 10*3/uL (ref 150–400)
RBC: 3.9 MIL/uL (ref 3.87–5.11)
RDW: 14.3 % (ref 11.5–15.5)
WBC: 15.2 10*3/uL — AB (ref 4.0–10.5)

## 2015-12-09 LAB — GLUCOSE, CAPILLARY: GLUCOSE-CAPILLARY: 407 mg/dL — AB (ref 65–99)

## 2015-12-09 LAB — TROPONIN I

## 2015-12-09 LAB — PROTIME-INR
INR: 1.46
PROTHROMBIN TIME: 17.9 s — AB (ref 11.4–15.2)

## 2015-12-09 LAB — GLUCOSE, RANDOM: GLUCOSE: 390 mg/dL — AB (ref 65–99)

## 2015-12-09 LAB — BRAIN NATRIURETIC PEPTIDE: B Natriuretic Peptide: 331 pg/mL — ABNORMAL HIGH (ref 0.0–100.0)

## 2015-12-09 MED ORDER — INSULIN GLARGINE 100 UNIT/ML ~~LOC~~ SOLN
10.0000 [IU] | Freq: Once | SUBCUTANEOUS | Status: AC
  Administered 2015-12-09: 10 [IU] via SUBCUTANEOUS
  Filled 2015-12-09: qty 0.1

## 2015-12-09 MED ORDER — METHYLPREDNISOLONE SODIUM SUCC 125 MG IJ SOLR
60.0000 mg | Freq: Two times a day (BID) | INTRAMUSCULAR | Status: DC
  Administered 2015-12-09 – 2015-12-11 (×4): 60 mg via INTRAVENOUS
  Filled 2015-12-09 (×4): qty 2

## 2015-12-09 MED ORDER — DOCUSATE SODIUM 100 MG PO CAPS
100.0000 mg | ORAL_CAPSULE | Freq: Two times a day (BID) | ORAL | Status: DC
  Administered 2015-12-09 – 2015-12-11 (×4): 100 mg via ORAL
  Filled 2015-12-09 (×4): qty 1

## 2015-12-09 MED ORDER — APIXABAN 5 MG PO TABS
5.0000 mg | ORAL_TABLET | Freq: Two times a day (BID) | ORAL | Status: DC
  Administered 2015-12-09 – 2015-12-11 (×4): 5 mg via ORAL
  Filled 2015-12-09 (×4): qty 1

## 2015-12-09 MED ORDER — ONDANSETRON HCL 4 MG PO TABS: 4.0000 mg | ORAL_TABLET | Freq: Four times a day (QID) | ORAL | Status: DC | PRN

## 2015-12-09 MED ORDER — PANTOPRAZOLE SODIUM 40 MG PO TBEC
80.0000 mg | DELAYED_RELEASE_TABLET | Freq: Every day | ORAL | Status: DC
  Administered 2015-12-09 – 2015-12-11 (×3): 80 mg via ORAL
  Filled 2015-12-09 (×3): qty 2

## 2015-12-09 MED ORDER — INSULIN ASPART 100 UNIT/ML ~~LOC~~ SOLN
8.0000 [IU] | Freq: Once | SUBCUTANEOUS | Status: AC
  Administered 2015-12-09: 8 [IU] via SUBCUTANEOUS
  Filled 2015-12-09: qty 1

## 2015-12-09 MED ORDER — FLUTICASONE FUROATE-VILANTEROL 100-25 MCG/INH IN AEPB
1.0000 | INHALATION_SPRAY | Freq: Every day | RESPIRATORY_TRACT | Status: DC | PRN
  Filled 2015-12-09: qty 28

## 2015-12-09 MED ORDER — ALBUTEROL SULFATE (2.5 MG/3ML) 0.083% IN NEBU: 2.5000 mg | INHALATION_SOLUTION | RESPIRATORY_TRACT | Status: DC | PRN

## 2015-12-09 MED ORDER — SODIUM CHLORIDE 0.9% FLUSH
3.0000 mL | Freq: Two times a day (BID) | INTRAVENOUS | Status: DC
  Administered 2015-12-09 – 2015-12-10 (×2): 3 mL via INTRAVENOUS

## 2015-12-09 MED ORDER — LEVOTHYROXINE SODIUM 88 MCG PO TABS
88.0000 ug | ORAL_TABLET | Freq: Every day | ORAL | Status: DC
  Administered 2015-12-10 – 2015-12-11 (×2): 88 ug via ORAL
  Filled 2015-12-09 (×2): qty 1

## 2015-12-09 MED ORDER — ALPRAZOLAM 0.5 MG PO TABS
0.5000 mg | ORAL_TABLET | Freq: Three times a day (TID) | ORAL | Status: DC | PRN
  Administered 2015-12-09 – 2015-12-11 (×2): 0.5 mg via ORAL
  Filled 2015-12-09 (×2): qty 1

## 2015-12-09 MED ORDER — METOPROLOL TARTRATE 25 MG PO TABS
25.0000 mg | ORAL_TABLET | Freq: Two times a day (BID) | ORAL | Status: DC
  Administered 2015-12-09 – 2015-12-11 (×4): 25 mg via ORAL
  Filled 2015-12-09 (×4): qty 1

## 2015-12-09 MED ORDER — ALBUTEROL SULFATE (2.5 MG/3ML) 0.083% IN NEBU
5.0000 mg | INHALATION_SOLUTION | RESPIRATORY_TRACT | Status: DC | PRN
  Administered 2015-12-09: 5 mg via RESPIRATORY_TRACT
  Filled 2015-12-09: qty 6

## 2015-12-09 MED ORDER — IPRATROPIUM-ALBUTEROL 0.5-2.5 (3) MG/3ML IN SOLN
3.0000 mL | Freq: Four times a day (QID) | RESPIRATORY_TRACT | Status: DC
  Administered 2015-12-09 – 2015-12-11 (×8): 3 mL via RESPIRATORY_TRACT
  Filled 2015-12-09 (×9): qty 3

## 2015-12-09 MED ORDER — ONDANSETRON HCL 4 MG/2ML IJ SOLN: 4.0000 mg | Freq: Four times a day (QID) | INTRAMUSCULAR | Status: DC | PRN

## 2015-12-09 MED ORDER — HYDROCODONE-ACETAMINOPHEN 5-325 MG PO TABS: 1.0000 | ORAL_TABLET | Freq: Two times a day (BID) | ORAL | Status: DC | PRN

## 2015-12-09 MED ORDER — INSULIN ASPART 100 UNIT/ML ~~LOC~~ SOLN
0.0000 [IU] | Freq: Three times a day (TID) | SUBCUTANEOUS | Status: DC
  Administered 2015-12-10: 15 [IU] via SUBCUTANEOUS
  Administered 2015-12-10 – 2015-12-11 (×3): 20 [IU] via SUBCUTANEOUS
  Administered 2015-12-11: 15 [IU] via SUBCUTANEOUS

## 2015-12-09 MED ORDER — ROSUVASTATIN CALCIUM 10 MG PO TABS
10.0000 mg | ORAL_TABLET | Freq: Every day | ORAL | Status: DC
  Administered 2015-12-09 – 2015-12-10 (×2): 10 mg via ORAL
  Filled 2015-12-09 (×2): qty 1

## 2015-12-09 MED ORDER — FUROSEMIDE 40 MG PO TABS
40.0000 mg | ORAL_TABLET | Freq: Every day | ORAL | Status: DC
  Administered 2015-12-09 – 2015-12-11 (×3): 40 mg via ORAL
  Filled 2015-12-09 (×3): qty 1

## 2015-12-09 MED ORDER — ACETAMINOPHEN 325 MG PO TABS: 650.0000 mg | ORAL_TABLET | Freq: Four times a day (QID) | ORAL | Status: DC | PRN

## 2015-12-09 MED ORDER — DILTIAZEM HCL ER COATED BEADS 180 MG PO CP24
360.0000 mg | ORAL_CAPSULE | Freq: Every day | ORAL | Status: DC
  Administered 2015-12-09 – 2015-12-11 (×3): 360 mg via ORAL
  Filled 2015-12-09 (×3): qty 2

## 2015-12-09 MED ORDER — ACETAMINOPHEN 650 MG RE SUPP: 650.0000 mg | Freq: Four times a day (QID) | RECTAL | Status: DC | PRN

## 2015-12-09 NOTE — ED Notes (Signed)
Gave patient diet ginger ale as requested and approved by MD. 

## 2015-12-09 NOTE — ED Triage Notes (Signed)
Patient brought in by EMS with shortness of breath. States patient is on 2L O2 at home and O2 saturation was 78% upon arrival to home. Patient was given two breathing treatments prior to arrival by EMS. Patient O2 saturation 89% on 3L at this time. Denies chest pain.

## 2015-12-09 NOTE — ED Notes (Signed)
Patient was ambulated in hall with 2 lpm o2.  Patient stated that she felt short of breath but her o2 sat were from 90-94%

## 2015-12-09 NOTE — H&P (Signed)
History and Physical    [name] [name] ZOX:[id number] DOB: 03/29/1940 DOA: 12/09/2015  PCP: Sherril Croon Consultants:  Diona Browner - cardiology Patient coming from:  Home - lives with son; Jackey Loge: son, [phone]; daughter - [phone] Chief Complaint: SOB  HPI: [name] [name] is a 75 y.o. female with medical history significant of O2-dependent COPD, DM, HLD, hypothyroidism, HTN, afib, and CAD presenting with apparent COPD exacerbation.  Patient reports that she developed breathing trouble last night about midnight.  Was in bed, noticed she was having difficulty breathing.  Sat up on side of bed and it got worse.  Stood up and still no better.  No cough.  Wears 2L home O2 and increased it to 3L.  Has been having cold sweats.  No sick contacts.  Admitted for PNA 9/29, had been doing well until this happened other than still weak.  Had home PT for about a week after last hospitalization.  Occasional rhinorrhea when O2 is removed.  Denies pain.     ED Course: Per Dr. Lynelle Doctor: Pt sx seem to be related to a COPD exacerbation.  No pna or chf.  Pt was given treatment and steroids by EMS.  Sx persist in the ED.  She tried walking around the ED but became very short of breath.  Will give an additional treatment.  Consult with medical service for admission. COPD  Review of Systems: As per HPI; otherwise 10 point review of systems reviewed and negative.   Ambulatory Status:  Ambulates without assistance in the house, rarely goes out  Past Medical History:  Diagnosis Date  . CAD S/P percutaneous coronary angioplasty 2005, 2012   RCA DES,CFX DES-low risk Nuc June 2016  . Cardiomyopathy (HCC)    EF 65-70% Aug 2016 echo  . Carotid artery disease (HCC)   . Chronic a-fib (HCC)    Eliquis started July 2016  . COPD (chronic obstructive pulmonary disease) (HCC)    Home O2  . Essential hypertension, benign   . Hyperlipidemia   . Hypothyroidism   . Sinus bradycardia    HR 30-40s with associated  lightheadedness/presyncoe. AVN blockers held with improvement.  . Type 2 diabetes mellitus (HCC)     Past Surgical History:  Procedure Laterality Date  . ABDOMINAL HYSTERECTOMY    . APPENDECTOMY    . CHOLECYSTECTOMY    . Excision of Melanoma  04/29/2010   Lower left neck    Social History   Social History  . Marital status: Widowed    Spouse name: N/A  . Number of children: N/A  . Years of education: N/A   Occupational History  . retired    Social History Main Topics  . Smoking status: Former Smoker    Packs/day: 0.50    Years: 45.00    Types: Cigarettes    Start date: 02/29/1964    Quit date: 06/08/2012  . Smokeless tobacco: Never Used  . Alcohol use No  . Drug use: No  . Sexual activity: Not on file   Other Topics Concern  . Not on file   Social History Narrative  . No narrative on file    Allergies  Allergen Reactions  . Adalat [Nifedipine] Nausea And Vomiting  . Claritin-D 12 Hour [Loratadine-Pseudoephedrine Er] Nausea And Vomiting  . Codeine     REACTION: stomach upset  . Plavix [Clopidogrel Bisulfate] Other (See Comments)    Legs hurt  . Wellbutrin [Bupropion] Other (See Comments)    nightmares  . Ciprofloxacin Nausea And Vomiting  .  Metformin And Related Other (See Comments)    Fatigue      Family History  Problem Relation Age of Onset  . Colon cancer Mother   . Throat cancer Father   . Heart disease Brother     Prior to Admission medications   Medication Sig Start Date End Date Taking? Authorizing Provider  ALPRAZolam Prudy Feeler(XANAX) 0.5 MG tablet Take 0.5 mg by mouth 3 (three) times daily as needed for anxiety.   Yes Historical Provider, MD  dexlansoprazole (DEXILANT) 60 MG capsule Take 60 mg by mouth daily.     Yes Historical Provider, MD  diltiazem (CARDIZEM CD) 360 MG 24 hr capsule Take 1 capsule (360 mg total) by mouth daily. 09/11/15  Yes Drema Dallasurtis J Woods, MD  ELIQUIS 5 MG TABS tablet TAKE 1 TABLET BY MOUTH TWICE DAILY 09/30/14  Yes Rollene RotundaJames Hochrein,  MD  Fluticasone Furoate-Vilanterol (BREO ELLIPTA) 100-25 MCG/INH AEPB Inhale 1 puff into the lungs daily as needed (wheezing/shortness of breath).    Yes Historical Provider, MD  furosemide (LASIX) 40 MG tablet Take 1 tablet (40 mg total) by mouth daily. 11/28/15  Yes Houston SirenPeter Le, MD  glipiZIDE (GLUCOTROL) 10 MG tablet Take 10 mg by mouth 2 (two) times daily before a meal.    Yes Historical Provider, MD  HYDROcodone-acetaminophen (NORCO/VICODIN) 5-325 MG tablet Take 1 tablet by mouth 2 (two) times daily as needed for moderate pain.   Yes Historical Provider, MD  levalbuterol (XOPENEX) 0.63 MG/3ML nebulizer solution Take 3 mLs (0.63 mg total) by nebulization every 3 (three) hours as needed for wheezing or shortness of breath. 09/11/15  Yes Drema Dallasurtis J Woods, MD  levothyroxine (SYNTHROID, LEVOTHROID) 88 MCG tablet Take 88 mcg by mouth daily before breakfast.   Yes Historical Provider, MD  meclizine (ANTIVERT) 25 MG tablet Take 12.5 mg by mouth daily as needed for dizziness.   Yes Historical Provider, MD  metoprolol tartrate (LOPRESSOR) 25 MG tablet Take 25 mg by mouth 2 (two) times daily.   Yes Historical Provider, MD  nitroGLYCERIN (NITROSTAT) 0.4 MG SL tablet Place 1 tablet (0.4 mg total) under the tongue every 5 (five) minutes x 3 doses as needed for chest pain. For severe chest pain 08/07/14  Yes Jonelle SidleSamuel G McDowell, MD  predniSONE (DELTASONE) 20 MG tablet Take 2 tablets (40 mg total) by mouth daily before breakfast. 11/28/15  Yes Houston SirenPeter Le, MD  rosuvastatin (CRESTOR) 10 MG tablet Take 1 tablet (10 mg total) by mouth daily. Patient taking differently: Take 10 mg by mouth at bedtime.  10/05/10  Yes June LeapGuy E de Gent, MD  Vitamin D, Ergocalciferol, (DRISDOL) 50000 units CAPS capsule Take 50,000 Units by mouth every 7 (seven) days. Takes on Mondays.   Yes Historical Provider, MD  azithromycin (ZITHROMAX Z-PAK) 250 MG tablet Use as directed. Patient not taking: Reported on 12/09/2015 11/27/15   Houston SirenPeter Le, MD  OXYGEN Inhale  2-3 L into the lungs continuous.    Historical Provider, MD    Physical Exam: Vitals:   12/09/15 1700 12/09/15 1807 12/09/15 2024 12/09/15 2115  BP: (!) 146/103 137/68  132/70  Pulse: (!) 57   60  Resp: 23   20  Temp:  97.5 F (36.4 C)  98.2 F (36.8 C)  TempSrc:  Oral  Oral  SpO2: 98% 93% 92% 93%  Weight:  71.2 kg (157 lb)    Height:  5\' 2"  (1.575 m)       General:  Appears calm and comfortable and is NAD Eyes:  PERRL, EOMI, normal lids, iris ENT:  grossly normal hearing, lips & tongue, mmm Neck:  no LAD, masses or thyromegaly Cardiovascular:  Irregularly irregular rate/rhythm, rate controlled, no m/r/g. No LE edema.  Respiratory: Mild wheezing diffusely, otherwise clear. Normal respiratory effort. Abdomen:  soft, ntnd, NABS Skin:  no rash or induration seen on limited exam Musculoskeletal:  grossly normal tone BUE/BLE, good ROM, no bony abnormality Psychiatric:  grossly normal mood and affect, speech fluent and appropriate, AOx3 Neurologic:  CN 2-12 grossly intact, moves all extremities in coordinated fashion, sensation intact  Labs on Admission: I have personally reviewed following labs and imaging studies  CBC:  Recent Labs Lab 12/09/15 1410  WBC 15.2*  NEUTROABS 14.2*  HGB 11.8*  HCT 37.6  MCV 96.4  PLT 270   Basic Metabolic Panel:  Recent Labs Lab 12/09/15 1410  NA 135  K 4.6  CL 98*  CO2 28  GLUCOSE 370*  BUN 17  CREATININE 0.81  CALCIUM 9.2   GFR: Estimated Creatinine Clearance: 56.3 mL/min (by C-G formula based on SCr of 0.81 mg/dL). Liver Function Tests: No results for input(s): AST, ALT, ALKPHOS, BILITOT, PROT, ALBUMIN in the last 168 hours. No results for input(s): LIPASE, AMYLASE in the last 168 hours. No results for input(s): AMMONIA in the last 168 hours. Coagulation Profile:  Recent Labs Lab 12/09/15 1410  INR 1.46   Cardiac Enzymes:  Recent Labs Lab 12/09/15 1410  TROPONINI <0.03   BNP (last 3 results) No results for  input(s): PROBNP in the last 8760 hours. HbA1C: No results for input(s): HGBA1C in the last 72 hours. CBG:  Recent Labs Lab 12/09/15 2121  GLUCAP 407*   Lipid Profile: No results for input(s): CHOL, HDL, LDLCALC, TRIG, CHOLHDL, LDLDIRECT in the last 72 hours. Thyroid Function Tests: No results for input(s): TSH, T4TOTAL, FREET4, T3FREE, THYROIDAB in the last 72 hours. Anemia Panel: No results for input(s): VITAMINB12, FOLATE, FERRITIN, TIBC, IRON, RETICCTPCT in the last 72 hours. Urine analysis: No results found for: COLORURINE, APPEARANCEUR, LABSPEC, PHURINE, GLUCOSEU, HGBUR, BILIRUBINUR, KETONESUR, PROTEINUR, UROBILINOGEN, NITRITE, LEUKOCYTESUR  Creatinine Clearance: Estimated Creatinine Clearance: 56.3 mL/min (by C-G formula based on SCr of 0.81 mg/dL).  Sepsis Labs: @LABRCNTIP (procalcitonin:4,lacticidven:4) )No results found for this or any previous visit (from the past 240 hour(s)).   Radiological Exams on Admission: Dg Chest 2 View  Result Date: 12/09/2015 CLINICAL DATA:  75 year old female with shortness of breath since last night. Initial encounter. EXAM: CHEST  2 VIEW COMPARISON:  Chest CT 11/25/2015 and earlier. FINDINGS: Right greater than left pleural effusions have not significantly changed since September. Stable lung volumes. Patchy right greater than left lung base opacity has not significantly changed since September. No superimposed pneumothorax or edema. Calcified aortic atherosclerosis. Stable cardiomegaly and mediastinal contours. Visualized tracheal air column is within normal limits. Osteopenia. No acute osseous abnormality identified. IMPRESSION: Ventilation appears not significantly changed since the CT on 11/25/2015, including small right greater than left pleural effusions and nonspecific bibasilar patchy opacity. No superimposed pulmonary edema or new cardiopulmonary abnormality. Cardiomegaly.  Calcified aortic atherosclerosis. Electronically Signed   By: Odessa Fleming M.D.   On: 12/09/2015 13:57    EKG: Independently reviewed.  Afib with rate 115; nonspecific ST changes with no evidence of acute ischemia, NSCSLT  Assessment/Plan Principal Problem:   Acute on chronic respiratory failure (HCC) Active Problems:   Dyslipidemia   Essential hypertension, benign   CAD S/P percutaneous coronary angioplasty   COPD with acute exacerbation (HCC)  Chronic atrial fibrillation (HCC)   Chronic anticoagulation-CHADs VASc=6   Other specified hypothyroidism   Uncontrolled type 2 diabetes mellitus with complication (HCC)   Acute on chronic respiratory failure 2* to COPD exacerbation -Patient with baseline O2 requirement of 2L, currently on 3L due to acute onset of dyspnea -will admit patient to telemetry bed  -Nebulizers: scheduled Duoneb and prn albuterol -Solu-Medrol 60 mg IV BID  -Does not have cough/sputum and so negative cardinal symptoms to indicate need for antibiotics at this time -Continue Breo Ellipta -has mildly elevated BNP but this appears to be close to baseline for her and a relatively normal Echo (preserved EF, possible mild diastolic dysfunction) in 7/17 -Is deconditioned from recent illness/hospitalization; PT consult requested  Afib -Rate controlled on Cardizem and Lopressor -On anti-coagulation based on CHA2DS2VASc score of 6, continue Eliquis -Monitor on telemetry for now  DM -Poorly controlled -Does not appear to be taking insulin -Will add Lantus 10 units tonight -A1c was 8.4 3 months ago, so would not be expected to require insulin at discharge; will check current A1c -Has reported Metformin allergy which complicates options for oral therapy  CAD -Denies chest pain -Does not appear to be c/w ACS  HTN -Continue Cardizem, Lopressor -It is not clear why she is not taking ACE-I/ARB for renal protection/HTN -BP likely would support addition of this medication  HLD -Continue Crestor  Hypothyroidism -Recent normal  TSH -Continue Synthroid at current dose  DVT prophylaxis: Eliquis Code Status: Full - confirmed with patient/family Family Communication: Daughter present throughout evaluation Disposition Plan: Home once clinically improved Consults called: PT  Admission status: Admit - It is my clinical opinion that admission to INPATIENT is reasonable and necessary because this patient will require at least 2 midnights in the hospital to treat this condition based on the medical complexity of the problems presented.  Given the aforementioned information, the predictability of an adverse outcome is felt to be significant.    Jonah Blue MD [facility]  If 7PM-7AM, please contact night-coverage [email or web address] Password [email or web address]  12/09/2015, 10:08 PM

## 2015-12-09 NOTE — ED Notes (Signed)
Increased oxygen to 4L, patient's O2 saturation increased to 92%.

## 2015-12-09 NOTE — ED Provider Notes (Signed)
[facility]-EMERGENCY DEPT Provider Note   CSN: [id number] Arrival date & time: 12/09/15  1319  By signing my name below, I, Modena Jansky, attest that this documentation has been prepared under the direction and in the presence of Linwood Dibbles, MD . Electronically Signed: Modena Jansky, Scribe. 12/09/2015. 1:49 PM.  History   Chief Complaint Chief Complaint  Patient presents with  . Shortness of Breath   The history is provided by the patient. No language interpreter was used.   HPI Comments: [name] [name] is a 75 y.o. female.brought in by ambulance, with a hx of COPD who presents to the Emergency Department complaining of SOB that started last night. Per nurse's note, pt was given two breathing treatments by EMS en route. Pt describes the SOB as exacerbated by activity. She reports associated symptoms of chills and diaphoresis. She is currently on Lasix medication. She admits to a hx of smoking prior to a year ago, hx of AFib, and a hx of stents. She denies any chest pain, abdominal pain, cough, or other complaints.     PCP: Ignatius Specking, MD  Past Medical History:  Diagnosis Date  . CAD S/P percutaneous coronary angioplasty 2005, 2012   RCA DES,CFX DES-low risk Nuc June 2016  . Cardiomyopathy (HCC)    EF 65-70% Aug 2016 echo  . Carotid artery disease (HCC)   . Chronic a-fib (HCC)    Eliquis started July 2016  . COPD (chronic obstructive pulmonary disease) (HCC)    Home O2  . Essential hypertension, benign   . Hyperlipidemia   . Hypothyroidism   . Sinus bradycardia    HR 30-40s with associated lightheadedness/presyncoe. AVN blockers held with improvement.  . Type 2 diabetes mellitus [facility])     Patient Active Problem List   Diagnosis Date Noted  . Diastolic CHF, acute on chronic (HCC)   . Uncontrolled type 2 diabetes mellitus with complication (HCC)   . Uncontrolled type 2 diabetes mellitus with complication, with long-term current use of insulin (HCC)   . Acute on chronic  respiratory failure with hypoxia (HCC)   . CAP (community acquired pneumonia)   . Acute on chronic diastolic CHF (congestive heart failure) (HCC)   . Other specified hypothyroidism   . Acute on chronic respiratory failure (HCC) 09/07/2015  . Chronic atrial fibrillation (HCC) 09/07/2015  . Atrial fibrillation with RVR (HCC) 09/07/2015  . Chronic anticoagulation-CHADs VASc=6 09/07/2015  . Type 2 diabetes with decreased circulation (HCC) 09/07/2015  . Sinus bradycardia 09/07/2012  . Paroxysmal atrial fibrillation (HCC) 09/07/2012  . TOBACCO ABUSE 04/09/2009  . CAROTID ARTERY DISEASE 04/09/2009  . COPD with acute exacerbation (HCC) 04/09/2009  . Dyslipidemia 11/20/2008  . Essential hypertension, benign 11/20/2008  . CAD S/P percutaneous coronary angioplasty 11/20/2008    Past Surgical History:  Procedure Laterality Date  . ABDOMINAL HYSTERECTOMY    . APPENDECTOMY    . CHOLECYSTECTOMY    . Excision of Melanoma  04/29/2010   Lower left neck    OB History    No data available       Home Medications    Prior to Admission medications   Medication Sig Start Date End Date Taking? Authorizing Provider  ALPRAZolam Prudy Feeler) 0.5 MG tablet Take 0.5 mg by mouth 3 (three) times daily as needed for anxiety.   Yes Historical Provider, MD  dexlansoprazole (DEXILANT) 60 MG capsule Take 60 mg by mouth daily.     Yes Historical Provider, MD  diltiazem (CARDIZEM CD) 360 MG 24  hr capsule Take 1 capsule (360 mg total) by mouth daily. 09/11/15  Yes Drema Dallas, MD  ELIQUIS 5 MG TABS tablet TAKE 1 TABLET BY MOUTH TWICE DAILY 09/30/14  Yes Rollene Rotunda, MD  Fluticasone Furoate-Vilanterol (BREO ELLIPTA) 100-25 MCG/INH AEPB Inhale 1 puff into the lungs daily as needed (wheezing/shortness of breath).    Yes Historical Provider, MD  furosemide (LASIX) 40 MG tablet Take 1 tablet (40 mg total) by mouth daily. 11/28/15  Yes Houston Siren, MD  glipiZIDE (GLUCOTROL) 10 MG tablet Take 10 mg by mouth 2 (two) times daily  before a meal.    Yes Historical Provider, MD  HYDROcodone-acetaminophen (NORCO/VICODIN) 5-325 MG tablet Take 1 tablet by mouth 2 (two) times daily as needed for moderate pain.   Yes Historical Provider, MD  levalbuterol (XOPENEX) 0.63 MG/3ML nebulizer solution Take 3 mLs (0.63 mg total) by nebulization every 3 (three) hours as needed for wheezing or shortness of breath. 09/11/15  Yes Drema Dallas, MD  levothyroxine (SYNTHROID, LEVOTHROID) 88 MCG tablet Take 88 mcg by mouth daily before breakfast.   Yes Historical Provider, MD  meclizine (ANTIVERT) 25 MG tablet Take 12.5 mg by mouth daily as needed for dizziness.   Yes Historical Provider, MD  metoprolol tartrate (LOPRESSOR) 25 MG tablet Take 25 mg by mouth 2 (two) times daily.   Yes Historical Provider, MD  nitroGLYCERIN (NITROSTAT) 0.4 MG SL tablet Place 1 tablet (0.4 mg total) under the tongue every 5 (five) minutes x 3 doses as needed for chest pain. For severe chest pain 08/07/14  Yes Jonelle Sidle, MD  predniSONE (DELTASONE) 20 MG tablet Take 2 tablets (40 mg total) by mouth daily before breakfast. 11/28/15  Yes Houston Siren, MD  rosuvastatin (CRESTOR) 10 MG tablet Take 1 tablet (10 mg total) by mouth daily. Patient taking differently: Take 10 mg by mouth at bedtime.  10/05/10  Yes June Leap, MD  Vitamin D, Ergocalciferol, (DRISDOL) 50000 units CAPS capsule Take 50,000 Units by mouth every 7 (seven) days. Takes on Mondays.   Yes Historical Provider, MD  azithromycin (ZITHROMAX Z-PAK) 250 MG tablet Use as directed. Patient not taking: Reported on 12/09/2015 11/27/15   Houston Siren, MD  OXYGEN Inhale 2-3 L into the lungs continuous.    Historical Provider, MD    Family History Family History  Problem Relation Age of Onset  . Colon cancer Mother   . Throat cancer Father   . Heart disease Brother     Social History Social History  Substance Use Topics  . Smoking status: Former Smoker    Packs/day: 0.50    Years: 45.00    Types: Cigarettes      Start date: 02/29/1964    Quit date: 06/08/2012  . Smokeless tobacco: Never Used  . Alcohol use No     Allergies   Adalat [nifedipine]; Claritin-d 12 hour [loratadine-pseudoephedrine er]; Codeine; Plavix [clopidogrel bisulfate]; Wellbutrin [bupropion]; Ciprofloxacin; and Metformin and related   Review of Systems Review of Systems A complete 10 system review of systems was obtained and all systems are negative except as noted in the HPI and PMH.   Physical Exam Updated Vital Signs BP 151/93 (BP Location: Right Arm)   Pulse (!) 59   Temp 97.5 F (36.4 C) (Oral)   Resp 24   Ht 5\' 2"  (1.575 m)   Wt 157 lb (71.2 kg)   SpO2 (!) 89%   BMI 28.72 kg/m   Physical Exam  Constitutional: No  distress.  HENT:  Head: Normocephalic and atraumatic.  Right Ear: External ear normal.  Left Ear: External ear normal.  Eyes: Conjunctivae are normal. Right eye exhibits no discharge. Left eye exhibits no discharge. No scleral icterus.  Neck: Neck supple. No tracheal deviation present.  Cardiovascular: Intact distal pulses.  An irregularly irregular rhythm present. Tachycardia present.   Pulmonary/Chest: Effort normal. No stridor. No respiratory distress. She has wheezes. She has rales.  Abdominal: Soft. Bowel sounds are normal. She exhibits no distension. There is no tenderness. There is no rebound and no guarding.  Musculoskeletal: She exhibits no edema or tenderness.  Neurological: She is alert. She has normal strength. No cranial nerve deficit (no facial droop, extraocular movements intact, no slurred speech) or sensory deficit. She exhibits normal muscle tone. She displays no seizure activity. Coordination normal.  Skin: Skin is warm and dry. No rash noted. She is not diaphoretic.  Psychiatric: She has a normal mood and affect.  Nursing note and vitals reviewed.    ED Treatments / Results  DIAGNOSTIC STUDIES: Oxygen Saturation is 89% on 3L O2, abnormal by my interpretation.     COORDINATION OF CARE: 1:54 PM- Pt advised of plan for treatment and pt agrees.  Labs (all labs ordered are listed, but only abnormal results are displayed) Labs Reviewed  CBC WITH DIFFERENTIAL/PLATELET - Abnormal; Notable for the following:       Result Value   WBC 15.2 (*)    Hemoglobin 11.8 (*)    Neutro Abs 14.2 (*)    Lymphs Abs 0.6 (*)    All other components within normal limits  BASIC METABOLIC PANEL - Abnormal; Notable for the following:    Chloride 98 (*)    Glucose, Bld 370 (*)    All other components within normal limits  BRAIN NATRIURETIC PEPTIDE - Abnormal; Notable for the following:    B Natriuretic Peptide 331.0 (*)    All other components within normal limits  PROTIME-INR - Abnormal; Notable for the following:    Prothrombin Time 17.9 (*)    All other components within normal limits  TROPONIN I    EKG  EKG Interpretation  Date/Time:  Wednesday December 09 2015 13:26:57 EDT Ventricular Rate:  115 PR Interval:    QRS Duration: 102 QT Interval:  363 QTC Calculation: 503 R Axis:   92 Text Interpretation:  Atrial fibrillation Ventricular premature complex Right axis deviation Borderline low voltage, extremity leads Nonspecific T abnormalities, lateral leads Prolonged QT interval No significant change since last tracing Confirmed by Claribel Sachs  MD-J, Makenzy Krist ([id number]) on 12/09/2015 1:29:33 PM       Radiology Dg Chest 2 View  Result Date: 12/09/2015 CLINICAL DATA:  75 year old female with shortness of breath since last night. Initial encounter. EXAM: CHEST  2 VIEW COMPARISON:  Chest CT 11/25/2015 and earlier. FINDINGS: Right greater than left pleural effusions have not significantly changed since September. Stable lung volumes. Patchy right greater than left lung base opacity has not significantly changed since September. No superimposed pneumothorax or edema. Calcified aortic atherosclerosis. Stable cardiomegaly and mediastinal contours. Visualized tracheal air column  is within normal limits. Osteopenia. No acute osseous abnormality identified. IMPRESSION: Ventilation appears not significantly changed since the CT on 11/25/2015, including small right greater than left pleural effusions and nonspecific bibasilar patchy opacity. No superimposed pulmonary edema or new cardiopulmonary abnormality. Cardiomegaly.  Calcified aortic atherosclerosis. Electronically Signed   By: Odessa FlemingH  Hall M.D.   On: 12/09/2015 13:57    Procedures Procedures (including  critical care time)  Medications Ordered in ED Medications  insulin aspart (novoLOG) injection 8 Units (not administered)  albuterol (PROVENTIL) (2.5 MG/3ML) 0.083% nebulizer solution 5 mg (not administered)     Initial Impression / Assessment and Plan / ED Course  I have reviewed the triage vital signs and the nursing notes.  Pertinent labs & imaging results that were available during my care of the patient were reviewed by me and considered in my medical decision making (see chart for details).  Clinical Course  Comment By Time  No acute findings on CXR Linwood Dibbles, MD 10/11 1452    Pt sx seem to be related to a COPD exacerbation.  No pna or chf.  Pt was given treatment and steroids by EMS.  Sx persist in the ED.  She tried walking around the ED but became very short of breath.  Will give an additional treatment.  Consult with medical service for admission. COPD  Final Clinical Impressions(s) / ED Diagnoses   Final diagnoses:  COPD exacerbation (HCC)        Linwood Dibbles, MD 12/09/15 1545

## 2015-12-10 DIAGNOSIS — I10 Essential (primary) hypertension: Secondary | ICD-10-CM

## 2015-12-10 DIAGNOSIS — J441 Chronic obstructive pulmonary disease with (acute) exacerbation: Principal | ICD-10-CM

## 2015-12-10 DIAGNOSIS — Z7901 Long term (current) use of anticoagulants: Secondary | ICD-10-CM

## 2015-12-10 DIAGNOSIS — E118 Type 2 diabetes mellitus with unspecified complications: Secondary | ICD-10-CM

## 2015-12-10 DIAGNOSIS — J9621 Acute and chronic respiratory failure with hypoxia: Secondary | ICD-10-CM

## 2015-12-10 DIAGNOSIS — E1165 Type 2 diabetes mellitus with hyperglycemia: Secondary | ICD-10-CM

## 2015-12-10 LAB — BASIC METABOLIC PANEL
Anion gap: 10 (ref 5–15)
BUN: 20 mg/dL (ref 6–20)
CALCIUM: 9 mg/dL (ref 8.9–10.3)
CHLORIDE: 96 mmol/L — AB (ref 101–111)
CO2: 29 mmol/L (ref 22–32)
CREATININE: 1 mg/dL (ref 0.44–1.00)
GFR calc Af Amer: >60 mL/min (ref >60)
GFR calc non Af Amer: 54 mL/min — ABNORMAL LOW (ref >60)
GLUCOSE: 491 mg/dL — AB (ref 65–99)
Potassium: 3.8 mmol/L (ref 3.5–5.1)
Sodium: 135 mmol/L (ref 135–145)

## 2015-12-10 LAB — CBC
HCT: 35.1 % — ABNORMAL LOW (ref 36.0–46.0)
Hemoglobin: 11 g/dL — ABNORMAL LOW (ref 12.0–15.0)
MCH: 30.1 pg (ref 26.0–34.0)
MCHC: 31.3 g/dL (ref 30.0–36.0)
MCV: 96.2 fL (ref 78.0–100.0)
PLATELETS: 245 10*3/uL (ref 150–400)
RBC: 3.65 MIL/uL — AB (ref 3.87–5.11)
RDW: 14.4 % (ref 11.5–15.5)
WBC: 9.5 10*3/uL (ref 4.0–10.5)

## 2015-12-10 LAB — GLUCOSE, CAPILLARY
GLUCOSE-CAPILLARY: 375 mg/dL — AB (ref 65–99)
Glucose-Capillary: 407 mg/dL — ABNORMAL HIGH (ref 65–99)
Glucose-Capillary: 333 mg/dL — ABNORMAL HIGH (ref 65–99)
Glucose-Capillary: 381 mg/dL — ABNORMAL HIGH (ref 65–99)

## 2015-12-10 LAB — GLUCOSE, RANDOM: Glucose, Bld: 378 mg/dL — ABNORMAL HIGH (ref 65–99)

## 2015-12-10 MED ORDER — INSULIN NPH (HUMAN) (ISOPHANE) 100 UNIT/ML ~~LOC~~ SUSP
7.0000 [IU] | Freq: Once | SUBCUTANEOUS | Status: AC
  Administered 2015-12-10: 7 [IU] via SUBCUTANEOUS
  Filled 2015-12-10: qty 10

## 2015-12-10 MED ORDER — INSULIN ASPART 100 UNIT/ML ~~LOC~~ SOLN
8.0000 [IU] | Freq: Three times a day (TID) | SUBCUTANEOUS | Status: DC
  Administered 2015-12-10 – 2015-12-11 (×4): 8 [IU] via SUBCUTANEOUS

## 2015-12-10 MED ORDER — INSULIN GLARGINE 100 UNIT/ML ~~LOC~~ SOLN
15.0000 [IU] | Freq: Two times a day (BID) | SUBCUTANEOUS | Status: DC
  Administered 2015-12-10 – 2015-12-11 (×3): 15 [IU] via SUBCUTANEOUS
  Filled 2015-12-10 (×9): qty 0.15

## 2015-12-10 MED ORDER — LEVOFLOXACIN IN D5W 750 MG/150ML IV SOLN
750.0000 mg | INTRAVENOUS | Status: DC
  Administered 2015-12-10: 750 mg via INTRAVENOUS
  Filled 2015-12-10: qty 150

## 2015-12-10 MED ORDER — POTASSIUM CHLORIDE IN NACL 20-0.9 MEQ/L-% IV SOLN
INTRAVENOUS | Status: DC
  Administered 2015-12-10 – 2015-12-11 (×2): via INTRAVENOUS

## 2015-12-10 NOTE — Progress Notes (Signed)
Inpatient Diabetes Program Recommendations  AACE/ADA: New Consensus Statement on Inpatient Glycemic Control (2015)  Target Ranges:  Prepandial:   less than 140 mg/dL      Peak postprandial:   less than 180 mg/dL (1-2 hours)      Critically ill patients:  140 - 180 mg/dL   Results for [name], Verdean O (MRN [id number]) as of 12/10/2015 08:21  Ref. Range 12/09/2015 21:21 12/10/2015 08:13  Glucose-Capillary Latest Ref Range: 65 - 99 mg/dL 027407 (H) 253375 (H)  Results for [name], Ashani O (MRN [id number]) as of 12/10/2015 08:21  Ref. Range 12/09/2015 14:10 12/09/2015 21:36 12/10/2015 06:08  Glucose Latest Ref Range: 65 - 99 mg/dL 259370 (H) 563390 (H) 875491 (H)   Review of Glycemic Control  Diabetes history: DM2 Outpatient Diabetes medications: Glipizide 10 mg BID Current orders for Inpatient glycemic control: Novolog 0-20 units TID with meals  Inpatient Diabetes Program Recommendations: Insulin - Basal: Patient received one time order of Lantus 10 units at 22:28 on 11/29/15. Fasting glucose is 375 mg/dl today. If steroids are continued, please consider ordering Lantus 15 units QHS. Correction (SSI): Please consider adding Novolog bedtime correction scale. Insulin - Meal Coverage: While inpatient and ordered steroids, please consider ordering Novolog 4 units TID with meals for meal coverage. HgbA1C: A1C in process.  Thanks, Orlando PennerMarie Jaquala Fuller, RN, MSN, CDE Diabetes Coordinator Inpatient Diabetes Program [phone] (Team Pager from 8am to 5pm) [phone] ([facility] office) [phone] [facility] office) [phone] [facility] office)

## 2015-12-10 NOTE — Progress Notes (Signed)
Nutrition Follow-up  INTERVENTION:  Recommend Heart Healthy/ CHO modified diet   Provide CHO modified/Heart Healthy diet education    NUTRITION DIAGNOSIS:  Impaired nutrient utilization related to diabetes dz and steroid tx? (COPD exacerbation) as evidenced by her medical hx and current hyperglycemia (Glucose 370-491 mg/dl).  GOAL:  Pt to meet >/= 90% of their estimated nutrition needs      MONITOR:  Po intake, labs and wt trends     REASON FOR ASSESSMENT: Assess current nutrition status     ASSESSMENT: Patient is very pleasant 75 yo female. She has hx of COPD, DM, HTN and CAD. She presents to ED with shortness of breath. This is her third admission in the past 6 months.  Patient denies any changes in her appetite despite her repeated acute and chronic illness. Meal intake is documented 100% of breakfast and lunch today. She is able to feed herself and says that she shops for and prepares her own food.  According to her weight hx there have been no significant changes. She was weighing 147# back in February which is an increase of 10# compared to current wt but not significant for timeframe.    Nutrition-Focused physical exam completed. Findings are no fat depletion, no muscle depletion, and no edema.     labs: glucose   Meds: lasix, insulin, prednisone  Diet Order:  Diet Carb Modified Fluid consistency: Thin; Room service appropriate? Yes  Skin:    No issues noted   Last BM:   10/10   Height:   Ht Readings from Last 1 Encounters:  12/09/15 5\' 2"  (1.575 m)    Weight:   Wt Readings from Last 1 Encounters:  12/09/15 157 lb (71.2 kg)  UBW-150-160# per pt  Ideal Body Weight:   50 kg  BMI:  Body mass index is 28.72 kg/m. overweight  Estimated Nutritional Needs:   Kcal:   1450-1600  Protein:   75-80 gr  Fluid:  >1.5 liters daily  EDUCATION NEEDS:  Provide diabetic diet review   Royann ShiversLynn Nyanna Heideman MS,RD,CSG,LDN Office: [phone] Pager: [phone]

## 2015-12-10 NOTE — Progress Notes (Signed)
PROGRESS NOTE    [name] [name]  GEX:[id number] DOB: November 12, 1940 DOA: 12/09/2015 PCP: Ignatius Specking, MD   Brief Narrative: Patient is a 75 year old woman with a history of O2 dependent COPD, DM, hypothyroidism, chronic A. fib-on Eliquis, and CAD, who presented to the ED on 12/09/2015 with a chief complaint of shortness of breath. In the ED, she was afebrile and mildly tachycardic. She was oxygenating 89% on 3 L of nasal cannula oxygen. Her lab data were significant for WBC of 15.2, potassium of 3.4, BNP of 331, normal troponin I, and chest x-ray that revealed small pleural effusion and nonspecific bibasilar patchy opacity, but no acute cardiopulmonary abnormality. She was admitted for COPD exacerbation.  Assessment & Plan:   Principal Problem:   COPD with acute exacerbation (HCC) Active Problems:   Acute on chronic respiratory failure with hypoxia (HCC)   Dyslipidemia   Essential hypertension, benign   CAD S/P percutaneous coronary angioplasty   Chronic atrial fibrillation (HCC)   Chronic anticoagulation-CHADs VASc=6   Other specified hypothyroidism   Uncontrolled type 2 diabetes mellitus with complication (HCC)    1. COPD with exacerbation. Patient was started on IV Solu-Medrol and duo nebulizers and restarted on Breo. She has improved slightly compared to admission. -We'll start Levaquin empirically given her recent hospitalization in 10/2015.  Acute on chronic respiratory failure with hypoxia, secondary to COPD exacerbation. The patient is prescribed 2 L nasal cannula oxygen chronically. She recently increased it to 3 L at home prior to admission. Her oxygen saturation was in the upper 80s on admission. -We'll continue oxygen titrated to keep her oxygen saturations above 90%.  Uncontrolled type 2 diabetes mellitus. Patient is treated chronically with glipizide twice a day. She had been prescribed an insulin pen by her PCP, but it intubated her so, so she had not been using it. She  is open to having instruction on insulin administration. -Her CBGs have been accelerated. She was started on sliding scale NovoLog. -We'll add mealtime coverage and Lantus. We'll give her NPH 1. We'll add gentle IV fluids 24 hours. -Hemoglobin A1c is pending.  Chronic atrial fibrillation and hypertension/CAD. Patient is treated chronically with diltiazem, metoprolol, and Eliquis, all restarted. Her blood pressure and heart rate are controlled.  Hypothyroidism. Patient is treated chronically with Synthroid. It was continued. Her TSH was within normal limits in 10/2015.     DVT prophylaxis: Eliquis Code Status: Full code Family Communication: Family not available Disposition Plan: Discharge to home and clinically appropriate   Consultants:   None  Procedures:   None  Antimicrobials:   Levaquin 12/10/15 >>   Subjective: Patient is still short of breath and has some chest congestion, but she says she is breathing better than when she first came in.  Objective: Vitals:   12/09/15 2115 12/10/15 0115 12/10/15 0501 12/10/15 0728  BP: 132/70  118/68   Pulse: 60  62   Resp: 20  20   Temp: 98.2 F (36.8 C)  98 F (36.7 C)   TempSrc: Oral  Oral   SpO2: 93% 95% 94% 90%  Weight:      Height:       No intake or output data in the 24 hours ending 12/10/15 0938 Filed Weights   12/09/15 1324 12/09/15 1807  Weight: 71.2 kg (157 lb) 71.2 kg (157 lb)    Examination:  General exam: Appears calm and comfortable  Respiratory system: Scattered wheezes and crackles. Respiratory effort normal. Cardiovascular system: Irregular, irregular No pedal  edema. Gastrointestinal system: Abdomen is nondistended, soft and nontender. No organomegaly or masses felt. Normal bowel sounds heard. Central nervous system: Alert and oriented. No focal neurological deficits. Extremities: Symmetric 5 x 5 power. Skin: No rashes, lesions or ulcers Psychiatry: Judgement and insight appear normal. Mood  & affect appropriate.     Data Reviewed: I have personally reviewed following labs and imaging studies  CBC:  Recent Labs Lab 12/09/15 1410 12/10/15 0608  WBC 15.2* 9.5  NEUTROABS 14.2*  --   HGB 11.8* 11.0*  HCT 37.6 35.1*  MCV 96.4 96.2  PLT 270 245   Basic Metabolic Panel:  Recent Labs Lab 12/09/15 1410 12/09/15 2136 12/10/15 0608  NA 135  --  135  K 4.6  --  3.8  CL 98*  --  96*  CO2 28  --  29  GLUCOSE 370* 390* 491*  BUN 17  --  20  CREATININE 0.81  --  1.00  CALCIUM 9.2  --  9.0   GFR: Estimated Creatinine Clearance: 45.6 mL/min (by C-G formula based on SCr of 1 mg/dL). Liver Function Tests: No results for input(s): AST, ALT, ALKPHOS, BILITOT, PROT, ALBUMIN in the last 168 hours. No results for input(s): LIPASE, AMYLASE in the last 168 hours. No results for input(s): AMMONIA in the last 168 hours. Coagulation Profile:  Recent Labs Lab 12/09/15 1410  INR 1.46   Cardiac Enzymes:  Recent Labs Lab 12/09/15 1410  TROPONINI <0.03   BNP (last 3 results) No results for input(s): PROBNP in the last 8760 hours. HbA1C: No results for input(s): HGBA1C in the last 72 hours. CBG:  Recent Labs Lab 12/09/15 2121 12/10/15 0813  GLUCAP 407* 375*   Lipid Profile: No results for input(s): CHOL, HDL, LDLCALC, TRIG, CHOLHDL, LDLDIRECT in the last 72 hours. Thyroid Function Tests: No results for input(s): TSH, T4TOTAL, FREET4, T3FREE, THYROIDAB in the last 72 hours. Anemia Panel: No results for input(s): VITAMINB12, FOLATE, FERRITIN, TIBC, IRON, RETICCTPCT in the last 72 hours. Sepsis Labs: No results for input(s): PROCALCITON, LATICACIDVEN in the last 168 hours.  No results found for this or any previous visit (from the past 240 hour(s)).       Radiology Studies: Dg Chest 2 View  Result Date: 12/09/2015 CLINICAL DATA:  75 year old female with shortness of breath since last night. Initial encounter. EXAM: CHEST  2 VIEW COMPARISON:  Chest CT  11/25/2015 and earlier. FINDINGS: Right greater than left pleural effusions have not significantly changed since September. Stable lung volumes. Patchy right greater than left lung base opacity has not significantly changed since September. No superimposed pneumothorax or edema. Calcified aortic atherosclerosis. Stable cardiomegaly and mediastinal contours. Visualized tracheal air column is within normal limits. Osteopenia. No acute osseous abnormality identified. IMPRESSION: Ventilation appears not significantly changed since the CT on 11/25/2015, including small right greater than left pleural effusions and nonspecific bibasilar patchy opacity. No superimposed pulmonary edema or new cardiopulmonary abnormality. Cardiomegaly.  Calcified aortic atherosclerosis. Electronically Signed   By: Odessa Fleming M.D.   On: 12/09/2015 13:57        Scheduled Meds: . apixaban  5 mg Oral BID  . diltiazem  360 mg Oral Daily  . docusate sodium  100 mg Oral BID  . furosemide  40 mg Oral Daily  . insulin aspart  0-20 Units Subcutaneous TID WC  . ipratropium-albuterol  3 mL Nebulization Q6H  . levothyroxine  88 mcg Oral QAC breakfast  . methylPREDNISolone (SOLU-MEDROL) injection  60 mg Intravenous  Q12H  . metoprolol tartrate  25 mg Oral BID  . pantoprazole  80 mg Oral Daily  . rosuvastatin  10 mg Oral QHS  . sodium chloride flush  3 mL Intravenous Q12H   Continuous Infusions:    LOS: 1 day    Time spent: 30 minutes    Elliot CousinFISHER,Antwain Caliendo, MD [facility] Pager [phone]  If 7PM-7AM, please contact night-coverage [email or web address] Password [email or web address] 12/10/2015, 9:38 AM

## 2015-12-10 NOTE — Progress Notes (Signed)
Teach back method was used with pt on insulin usage. Pt demonstrated how to aseptically draw up insulin, administer the medication, and how to safely discard needle. Verbalized understanding.

## 2015-12-10 NOTE — Progress Notes (Signed)
Pharmacy Antibiotic Note  [name] [name] is a 75 y.o. female admitted on 12/09/2015 with COPD exacerbation.  Pharmacy has been consulted for levaquin dosing.  Plan: Levaquin 750mg  IV q48hrs F/U cultures and monitor clinical progress Transition to po as indicated  Height: 5\' 2"  (157.5 cm) Weight: 157 lb (71.2 kg) IBW/kg (Calculated) : 50.1  Temp (24hrs), Avg:97.9 F (36.6 C), Min:97.5 F (36.4 C), Max:98.2 F (36.8 C)   Recent Labs Lab 12/09/15 1410 12/10/15 0608  WBC 15.2* 9.5  CREATININE 0.81 1.00    Estimated Creatinine Clearance: 45.6 mL/min (by C-G formula based on SCr of 1 mg/dL).    Allergies  Allergen Reactions  . Adalat [Nifedipine] Nausea And Vomiting  . Claritin-D 12 Hour [Loratadine-Pseudoephedrine Er] Nausea And Vomiting  . Codeine     REACTION: stomach upset  . Plavix [Clopidogrel Bisulfate] Other (See Comments)    Legs hurt  . Wellbutrin [Bupropion] Other (See Comments)    nightmares  . Ciprofloxacin Nausea And Vomiting  . Metformin And Related Other (See Comments)    Fatigue      Antimicrobials this admission: levaquin 10/12>>  Dose adjustments this admission: n/a  Microbiology results: N/a  Thank you for allowing pharmacy to be a part of this patient's care.  Elder CyphersLorie Anis Degidio, BS Loura Backharm D, [place]BCPS Clinical Pharmacist Pager [phone] 12/10/2015 4:08 PM

## 2015-12-10 NOTE — Evaluation (Signed)
Occupational Therapy Evaluation Patient Details Name: [name] [name] MRN: [id number] DOB: 03/24/1940 Today's Date: 12/10/2015    History of Present Illness 75 y.o. female with medical history significant of O2-dependent COPD, DM, HLD, hypothyroidism, HTN, afib, and CAD presenting with apparent COPD exacerbation.  Patient reports that she developed breathing trouble last night about midnight.  Was in bed, noticed she was having difficulty breathing.  Sat up on side of bed and it got worse.  Stood up and still no better.  No cough.  Wears 2L home O2 and increased it to 3L.  Has been having cold sweats.  No sick contacts.  Admitted for PNA 9/29, had been doing well until this happened other than still weak.  Had home PT for about a week after last hospitalization.  Occasional rhinorrhea when O2 is removed.  Denies pain.  Dx: CAP, COPD exacerbation, and Afib.    Clinical Impression   Pt awake, alert, oriented x4 this am. Pt lives with her son, daughter does most driving for pt to go out in community. Pt completed ADL tasks prior to OT entering room with independence, pt demonstrates independence in mobility tasks during evaluation. No further OT services required as pt is at baseline with functional tasks.     Follow Up Recommendations  No OT follow up    Equipment Recommendations  None recommended by OT       Precautions / Restrictions Precautions Precautions: None Restrictions Weight Bearing Restrictions: No      Mobility Bed Mobility Overal bed mobility: Modified Independent                Transfers Overall transfer level: Independent                         ADL Overall ADL's : Modified independent;At baseline                                                       Pertinent Vitals/Pain Pain Assessment: No/denies pain     Hand Dominance Right   Extremity/Trunk Assessment Upper Extremity Assessment Upper Extremity Assessment:  Overall WFL for tasks assessed   Lower Extremity Assessment Lower Extremity Assessment: Overall WFL for tasks assessed       Communication Communication Communication: No difficulties   Cognition Arousal/Alertness: Awake/alert Behavior During Therapy: WFL for tasks assessed/performed Overall Cognitive Status: Within Functional Limits for tasks assessed                                Home Living Family/patient expects to be discharged to:: Private residence Living Arrangements: Children (son-completes yardwork and works around the house) Available Help at Discharge: Family;Available PRN/intermittently Type of Home: House Home Access: Stairs to enter [employer] of Steps: 1   Home Layout: One level     Bathroom Shower/Tub: Tub/shower unit;Curtain (sits on footstool in bathtub)   Bathroom Toilet: Standard     Home Equipment: Grab bars - tub/shower;Walker - 2 wheels;Cane - single point;Shower seat (2L O2 at home, can use 3L if needed)          Prior Functioning/Environment Level of Independence: Independent  Gait / Transfers Assistance Needed: independent - furniture walking ADL's / Homemaking Assistance Needed: independent, Dtr does  most of the driving, Pt uses the shopping cart with O2 tank in it at the grocery store.              OT Problem List: Cardiopulmonary status limiting activity                    Co-evaluation PT/OT/SLP Co-Evaluation/Treatment: Yes Reason for Co-Treatment: Complexity of the patient's impairments (multi-system involvement) PT goals addressed during session: Mobility/safety with mobility;Balance OT goals addressed during session: ADL's and self-care      End of Session Equipment Utilized During Treatment: Gait belt  Activity Tolerance: Patient tolerated treatment well Patient left: in chair;with call bell/phone within reach   Time: 0905-0921 OT Time Calculation (min): 16 min Charges:  OT General  Charges $OT Visit: 1 Procedure OT Evaluation $OT Eval Low Complexity: 1 Procedure Ezra SitesLeslie Troxler, OTR/L  [phone] 12/10/2015, 9:38 AM

## 2015-12-10 NOTE — Evaluation (Signed)
Physical Therapy Evaluation Patient Details Name: [name] [name] MRN: [id number] DOB: Jul 20, 1940 Today's Date: 12/10/2015   History of Present Illness  75 y.o. female with medical history significant of O2-dependent COPD, DM, HLD, hypothyroidism, HTN, afib, and CAD presenting with apparent COPD exacerbation.  Patient reports that she developed breathing trouble last night about midnight.  Was in bed, noticed she was having difficulty breathing.  Sat up on side of bed and it got worse.  Stood up and still no better.  No cough.  Wears 2L home O2 and increased it to 3L.  Has been having cold sweats.  No sick contacts.  Admitted for PNA 9/29, had been doing well until this happened other than still weak.  Had home PT for about a week after last hospitalization.  Occasional rhinorrhea when O2 is removed.  Denies pain.  Dx: CAP, COPD exacerbation, and Afib.   Clinical Impression  Pt received in bed, and was agreeable to PT evaluation.  Pt normally does not use any DME for ambulation, but admits to being a furniture walker.  She lives with her son who is at home most of the time.  She states that her dtr does most of the driving, but she goes with her to the grocery store, and pushes the shopping cart.  During today's evaluation, she demonstrated independence with all functional mobility, and ambulated 161ft.  She does not demonstrate any skilled PT needs at this time, therefore, will sign off.      Follow Up Recommendations No PT follow up    Equipment Recommendations  None recommended by PT    Recommendations for Other Services       Precautions / Restrictions Precautions Precautions: None Restrictions Weight Bearing Restrictions: No      Mobility  Bed Mobility Overal bed mobility: Modified Independent                Transfers Overall transfer level: Independent                  Ambulation/Gait Ambulation/Gait assistance: Independent Ambulation Distance (Feet): 100  Feet Assistive device: None Gait Pattern/deviations: WFL(Within Functional Limits)     General Gait Details: Pt ambulated while pushing the portable O2 tank.  Pt inquired about the small portable O2 tanks - informed CM of this inquiry.   Stairs            Wheelchair Mobility    Modified Rankin (Stroke Patients Only)       Balance Overall balance assessment: Needs assistance         Standing balance support: Single extremity supported Standing balance-Leahy Scale: Good                               Pertinent Vitals/Pain Pain Assessment: No/denies pain    Home Living   Living Arrangements: Children (Son - home most of the time.  Does the yard work and works in [employer]. )   Type of Home: [employer] Access: Stairs to enter   Secretary/administrator of Steps: 1 Home Layout: One level Home Equipment: Grab bars - tub/shower;Walker - 2 wheels;Cane - single point;Shower seat (O2 - on 2L at home.  Can go up to 3L)      Prior Function     Gait / Transfers Assistance Needed: independent - furniture walking  ADL's / Homemaking Assistance Needed: independent, Dtr does most of the driving, Pt uses the shopping  cart with O2 tank in it at the grocery store.          Hand Dominance   Dominant Hand: Right    Extremity/Trunk Assessment   Upper Extremity Assessment: Overall WFL for tasks assessed           Lower Extremity Assessment: Overall WFL for tasks assessed         Communication   Communication: No difficulties  Cognition Arousal/Alertness: Awake/alert Behavior During Therapy: WFL for tasks assessed/performed Overall Cognitive Status: Within Functional Limits for tasks assessed                      General Comments      Exercises     Assessment/Plan    PT Assessment Patent does not need any further PT services  PT Problem List            PT Treatment Interventions      PT Goals (Current goals can be found in the  Care Plan section)  Acute Rehab PT Goals PT Goal Formulation: All assessment and education complete, DC therapy    Frequency     Barriers to discharge        Co-evaluation PT/OT/SLP Co-Evaluation/Treatment: Yes Reason for Co-Treatment: Complexity of the patient's impairments (multi-system involvement) PT goals addressed during session: Mobility/safety with mobility;Balance         End of Session Equipment Utilized During Treatment: Gait belt;Oxygen Activity Tolerance: Patient tolerated treatment well Patient left: in chair;with call bell/phone within reach      Functional Assessment Tool Used: [employer] AM-PAC "6-clicks"  Functional Limitation: Mobility: Walking and moving around Mobility: Walking and Moving Around Current Status [phone]([id number]): At least 1 percent but less than 20 percent impaired, limited or restricted Mobility: Walking and Moving Around Goal Status [phone]([id number]): At least 1 percent but less than 20 percent impaired, limited or restricted Mobility: Walking and Moving Around Discharge Status [phone]([id number]): At least 1 percent but less than 20 percent impaired, limited or restricted    Time: 0905-0921 PT Time Calculation (min) (ACUTE ONLY): 16 min   Charges:   PT Evaluation $PT Eval Low Complexity: 1 Procedure     PT G Codes:   PT G-Codes **NOT FOR INPATIENT CLASS** Functional Assessment Tool Used: [employer] AM-PAC "6-clicks"  Functional Limitation: Mobility: Walking and moving around Mobility: Walking and Moving Around Current Status [phone]([id number]): At least 1 percent but less than 20 percent impaired, limited or restricted Mobility: Walking and Moving Around Goal Status [phone]([id number]): At least 1 percent but less than 20 percent impaired, limited or restricted Mobility: Walking and Moving Around Discharge Status [phone]([id number]): At least 1 percent but less than 20 percent impaired, limited or restricted    Beth Jaxyn Mestas, PT, DPT X: [phone]

## 2015-12-10 NOTE — Clinical Social Work Note (Signed)
Clinical Social Work Assessment  Patient Details  Name: [name] [name] MRN: [id number] Date of Birth: 11/21/1940  Date of referral:  12/10/15               Reason for consult:  Other (Comment Required)                Permission sought to share information with:    Permission granted to share information::     Name::        Agency::     Relationship::     Contact Information:     Housing/Transportation Living arrangements for the past 2 months:  Single Family Home Source of Information:  Patient Patient Interpreter Needed:  None Criminal Activity/Legal Involvement Pertinent to Current Situation/Hospitalization:    Significant Relationships:  Adult Children Lives with:  Adult Children Do you feel safe going back to the place where you live?  Yes Need for family participation in patient care:  Yes (Comment)  Care giving concerns:  None identified.    Social Worker assessment / plan:  Patient advised that her son resides in the home with her. She stated that she is independent in her ADLs and ambulating.  Patient stated that sometimes she gets upset because "I can't do what I need to do, can't do for myself like I want to. I want to get well." Patinet scored a 3 on the GAD-7 scale and a 5 on the PHQ-9 scale.  CSW finds no further needs. CSW signing off.   Employment status:  Retired Health and safety inspectornsurance information:  Medicare PT Recommendations:  Not assessed at this time Information / Referral to community resources:     Patient/Family's Response to care:  Patient is agreeable that she is not having issues with anxiety and/or depression that are worsening her COPD.   Patient/Family's Understanding of and Emotional Response to Diagnosis, Current Treatment, and Prognosis: Patient understands her diagnosis, treatment and prognosis.   Emotional Assessment Appearance:  Appears stated age Attitude/Demeanor/Rapport:   (Cooperative) Affect (typically observed):  Accepting Orientation:  Oriented  to Self, Oriented to Place, Oriented to  Time, Oriented to Situation Alcohol / Substance use:  Not Applicable Psych involvement (Current and /or in the community):  No (Comment)  Discharge Needs  Concerns to be addressed:  No discharge needs identified Readmission within the last 30 days:  Yes Current discharge risk:  None Barriers to Discharge:  No Barriers Identified   Annice NeedySettle, Zonnie Landen D, LCSW 12/10/2015, 12:03 PM

## 2015-12-11 DIAGNOSIS — I2510 Atherosclerotic heart disease of native coronary artery without angina pectoris: Secondary | ICD-10-CM

## 2015-12-11 DIAGNOSIS — Z9861 Coronary angioplasty status: Secondary | ICD-10-CM

## 2015-12-11 DIAGNOSIS — I482 Chronic atrial fibrillation: Secondary | ICD-10-CM

## 2015-12-11 LAB — COMPREHENSIVE METABOLIC PANEL
ALBUMIN: 3.8 g/dL (ref 3.5–5.0)
ALT: 20 U/L (ref 14–54)
AST: 17 U/L (ref 15–41)
Alkaline Phosphatase: 54 U/L (ref 38–126)
Anion gap: 9 (ref 5–15)
BUN: 26 mg/dL — AB (ref 6–20)
CHLORIDE: 97 mmol/L — AB (ref 101–111)
CO2: 26 mmol/L (ref 22–32)
CREATININE: 1.02 mg/dL — AB (ref 0.44–1.00)
Calcium: 9.2 mg/dL (ref 8.9–10.3)
GFR calc Af Amer: >60 mL/min (ref >60)
GFR, EST NON AFRICAN AMERICAN: 53 mL/min — AB (ref >60)
GLUCOSE: 392 mg/dL — AB (ref 65–99)
POTASSIUM: 3.8 mmol/L (ref 3.5–5.1)
SODIUM: 132 mmol/L — AB (ref 135–145)
Total Bilirubin: 0.7 mg/dL (ref 0.3–1.2)
Total Protein: 6.9 g/dL (ref 6.5–8.1)

## 2015-12-11 LAB — CBC
HCT: 35.7 % — ABNORMAL LOW (ref 36.0–46.0)
Hemoglobin: 11.1 g/dL — ABNORMAL LOW (ref 12.0–15.0)
MCH: 30 pg (ref 26.0–34.0)
MCHC: 31.1 g/dL (ref 30.0–36.0)
MCV: 96.5 fL (ref 78.0–100.0)
PLATELETS: 269 10*3/uL (ref 150–400)
RBC: 3.7 MIL/uL — AB (ref 3.87–5.11)
RDW: 14.6 % (ref 11.5–15.5)
WBC: 16.9 10*3/uL — AB (ref 4.0–10.5)

## 2015-12-11 LAB — GLUCOSE, CAPILLARY
GLUCOSE-CAPILLARY: 386 mg/dL — AB (ref 65–99)
Glucose-Capillary: 333 mg/dL — ABNORMAL HIGH (ref 65–99)

## 2015-12-11 LAB — HEMOGLOBIN A1C
HEMOGLOBIN A1C: 8.9 % — AB (ref 4.8–5.6)
Mean Plasma Glucose: 209 mg/dL

## 2015-12-11 MED ORDER — LEVOFLOXACIN 500 MG PO TABS: 500.0000 mg | ORAL_TABLET | Freq: Every day | ORAL | 0 refills | Status: DC

## 2015-12-11 MED ORDER — GLIPIZIDE 10 MG PO TABS: 10.0000 mg | ORAL_TABLET | Freq: Two times a day (BID) | ORAL | Status: AC

## 2015-12-11 MED ORDER — INSULIN GLARGINE 100 UNIT/ML ~~LOC~~ SOLN: 15.0000 [IU] | Freq: Two times a day (BID) | SUBCUTANEOUS | 11 refills | Status: DC

## 2015-12-11 MED ORDER — PREDNISONE 10 MG PO TABS: ORAL_TABLET | ORAL | 0 refills | Status: DC

## 2015-12-11 MED ORDER — INSULIN SYRINGES (DISPOSABLE) U-100 1 ML MISC: 15.0000 [IU] | Freq: Two times a day (BID) | 6 refills | Status: AC

## 2015-12-11 NOTE — Care Management Important Message (Signed)
Important Message  Patient Details  Name: [name] [name] MRN: [id number] Date of Birth: 02/04/1941   Medicare Important Message Given:  Yes    Karnell Vanderloop, Chrystine OilerSharley Diane, RN 12/11/2015, 5:24 PM

## 2015-12-11 NOTE — Progress Notes (Signed)
Pt IV and telemetry removed, tolerated well. 

## 2015-12-11 NOTE — Progress Notes (Signed)
Reviewed discharge instructions with pt and answered all questions.  Waiting for pt daugther with pt Oxygen to discharge home.

## 2015-12-11 NOTE — Discharge Summary (Signed)
Physician Discharge Summary  [name] [name] [name] [name] RUE:[id number] DOB: 10/29/1940 DOA: 12/09/2015  PCP: [name] Speckinghruv B Vyas, MD  Admit date: 12/09/2015 Discharge date: 12/11/2015  Time spent: Greater than 30 minutes  Recommendations for Outpatient Follow-up:  1. Recommend reassessment of the patient's insulin compliance.    Discharge Diagnoses:  Principal Problem:   COPD with acute exacerbation (HCC) Active Problems:   Acute on chronic respiratory failure with hypoxia (HCC)   Dyslipidemia   Essential hypertension, benign   CAD S/P percutaneous coronary angioplasty   Chronic atrial fibrillation (HCC)   Chronic anticoagulation-CHADs VASc=6   Other specified hypothyroidism   Uncontrolled type 2 diabetes mellitus with complication [facility](HCC)   Discharge Condition: Improved.  Diet recommendation: Our healthy/carbohydrate modified.  Filed Weights   12/09/15 1324 12/09/15 1807  Weight: 71.2 kg (157 lb) 71.2 kg (157 lb)    History of present illness:   Patient is a 75 year old woman with a history of O2 dependent COPD, DM, hypothyroidism, chronic A. fib-on Eliquis, and CAD, who presented to the ED on 12/09/2015 with a chief complaint of shortness of breath. In the ED, she was afebrile and mildly tachycardic. She was oxygenating 89% on 3 L of nasal cannula oxygen. Her lab data were significant for WBC of 15.2, potassium of 3.4, BNP of 331, normal troponin I, and chest x-ray that revealed small pleural effusion and nonspecific bibasilar patchy opacity, but no acute cardiopulmonary abnormality. She was admitted for COPD exacerbation.  Hospital Course:   1. COPD with exacerbation. Patient was started on IV Solu-Medrol and DuoNebs.  Breo Ellipta inhaler was restarted. Eventually, Levaquin was started empirically, due to her recent hospitalization for pneumonia in September 2017. -She improved clinically and symptomatically rather quickly. -She was discharged on a prednisone taper and Levaquin for a few  more days.  Acute on chronic respiratory failure with hypoxia, secondary to COPD exacerbation. The patient is prescribed 2 L nasal cannula oxygen chronically. Her oxygen saturation was in the upper 80s on 3 L of nasal cannula oxygen on admission. Supplement oxygen was titrated up to keep her oxygen saturations greater than 90%. With improvement in her symptoms, she was able to be weaned back down to 2 L. Her oxygen saturation was 98% on 2 L at the time of discharge.  Uncontrolled type 2 diabetes mellitus. Patient is treated chronically with glipizide twice daily. She had been prescribed an insulin pen by her PCP, but it intimidated her, so she she never started it.  Her blood glucose was in the 300s and then increased to the 400s on IV Solu-Medrol. SSI and Latus were started with adjustments in the dosing. NPH was given 1. Gentle IV fluids were started to help with management of the hyperglycemia. -Her A1c was 8.9. - Home insulin therapy with insulin administration given via the vial and syringe was suggested. She was receptive and received education on insulin administration by the nursing staff. She demonstrated good understanding. -Patient was discharged home on Lantus 15 units twice a day. She was instructed to continue glipizide, but was instructed to not take it if her blood sugar fell below 125.   Chronic atrial fibrillation and hypertension/CAD. Patient is treated chronically with diltiazem, metoprolol, and Eliquis, all were restarted. Her blood pressure and heart rate remained controlled.  Hypothyroidism. Patient is treated chronically with Synthroid. It was continued. Her TSH was within normal limits in 10/2015.  Leukocytosis. The patient's white blood cell count was within normal limits on admission, then normalized, then increased to  16.9. The increase was thought to be secondary to IV Solu-Medrol. She remained afebrile. Recommend follow-up CBC at her follow-up appointment with her  PCP.  Normocytic anemia. The patient's mild anemia appears to be chronic with a baseline hemoglobin of 11.0-11.5. It remained stable.   Procedures:  None  Consultations:  None  Discharge Exam: Vitals:   12/11/15 0428  BP: 135/63  Pulse: 88  Resp: 18  Temp: 98.7 F (37.1 C)  Oxygen saturation 98% on 2 L nasal cannula oxygen. General exam: Appears calm and comfortable  Respiratory system: Coarse breath sounds, but nearly clear to auscultation. Respiratory effort normal. Cardiovascular system: Irregular, irregular No pedal edema. Gastrointestinal system: Abdomen is nondistended, soft and nontender. No organomegaly or masses felt. Normal bowel sounds heard. Central nervous system: Alert and oriented. No focal neurological deficits.    Discharge Instructions    Current Discharge Medication List    START taking these medications   Details  insulin glargine (LANTUS) 100 UNIT/ML injection Inject 0.15 mLs (15 Units total) into the skin 2 (two) times daily. Qty: 10 mL, Refills: 11    Insulin Syringes, Disposable, U-100 1 ML MISC 15 Units by Does not apply route 2 (two) times daily. Qty: 200 each, Refills: 6    levofloxacin (LEVAQUIN) 500 MG tablet Take 1 tablet (500 mg total) by mouth daily. Antibiotic. Starting 12/12/15 take 1 tablet daily until completed. Qty: 4 tablet, Refills: 0      CONTINUE these medications which have CHANGED   Details  glipiZIDE (GLUCOTROL) 10 MG tablet Take 1 tablet (10 mg total) by mouth 2 (two) times daily before a meal. Do not take glipizide if blood sugar falls below 125.    predniSONE (DELTASONE) 10 MG tablet Starting 12/12/15, take 6 tablets daily for 1 day; then 5 tablets the next day for 1 day; then 4 tablets the next day for 1 day; then 3 tablets the next day for 1 day; then 2 tablets the next day for 1 day; then 1 tablet the next day for 1 day; then stop. Qty: 21 tablet, Refills: 0      CONTINUE these medications which have NOT CHANGED    Details  ALPRAZolam (XANAX) 0.5 MG tablet Take 0.5 mg by mouth 3 (three) times daily as needed for anxiety.    dexlansoprazole (DEXILANT) 60 MG capsule Take 60 mg by mouth daily.      diltiazem (CARDIZEM CD) 360 MG 24 hr capsule Take 1 capsule (360 mg total) by mouth daily. Qty: 30 capsule, Refills: 0    ELIQUIS 5 MG TABS tablet TAKE 1 TABLET BY MOUTH TWICE DAILY Qty: 60 tablet, Refills: 5    Fluticasone Furoate-Vilanterol (BREO ELLIPTA) 100-25 MCG/INH AEPB Inhale 1 puff into the lungs daily as needed (wheezing/shortness of breath).     furosemide (LASIX) 40 MG tablet Take 1 tablet (40 mg total) by mouth daily. Qty: 30 tablet, Refills: 1    HYDROcodone-acetaminophen (NORCO/VICODIN) 5-325 MG tablet Take 1 tablet by mouth 2 (two) times daily as needed for moderate pain.    levalbuterol (XOPENEX) 0.63 MG/3ML nebulizer solution Take 3 mLs (0.63 mg total) by nebulization every 3 (three) hours as needed for wheezing or shortness of breath. Qty: 3 mL, Refills: 12    levothyroxine (SYNTHROID, LEVOTHROID) 88 MCG tablet Take 88 mcg by mouth daily before breakfast.    meclizine (ANTIVERT) 25 MG tablet Take 12.5 mg by mouth daily as needed for dizziness.    metoprolol tartrate (LOPRESSOR) 25 MG tablet  Take 25 mg by mouth 2 (two) times daily.    nitroGLYCERIN (NITROSTAT) 0.4 MG SL tablet Place 1 tablet (0.4 mg total) under the tongue every 5 (five) minutes x 3 doses as needed for chest pain. For severe chest pain Qty: 25 tablet, Refills: 3    rosuvastatin (CRESTOR) 10 MG tablet Take 1 tablet (10 mg total) by mouth daily. Qty: 30 tablet, Refills: 6    Vitamin D, Ergocalciferol, (DRISDOL) 50000 units CAPS capsule Take 50,000 Units by mouth every 7 (seven) days. Takes on Mondays.    OXYGEN Inhale 2-3 L into the lungs continuous.       Allergies  Allergen Reactions  . Adalat [Nifedipine] Nausea And Vomiting  . Claritin-D 12 Hour [Loratadine-Pseudoephedrine Er] Nausea And Vomiting  .  Codeine     REACTION: stomach upset  . Plavix [Clopidogrel Bisulfate] Other (See Comments)    Legs hurt  . Wellbutrin [Bupropion] Other (See Comments)    nightmares  . Ciprofloxacin Nausea And Vomiting  . Metformin And Related Other (See Comments)    Fatigue     Follow-up Information    [name] Specking, MD .   Specialty:  Internal Medicine Why:  Follow-up as scheduled. Contact information: [address] [place] [address] [phone]            The results of significant diagnostics from this hospitalization (including imaging, microbiology, ancillary and laboratory) are listed below for reference.    Significant Diagnostic Studies: Dg Chest 2 View  Result Date: 12/09/2015 CLINICAL DATA:  75 year old female with shortness of breath since last night. Initial encounter. EXAM: CHEST  2 VIEW COMPARISON:  Chest CT 11/25/2015 and earlier. FINDINGS: Right greater than left pleural effusions have not significantly changed since September. Stable lung volumes. Patchy right greater than left lung base opacity has not significantly changed since September. No superimposed pneumothorax or edema. Calcified aortic atherosclerosis. Stable cardiomegaly and mediastinal contours. Visualized tracheal air column is within normal limits. Osteopenia. No acute osseous abnormality identified. IMPRESSION: Ventilation appears not significantly changed since the CT on 11/25/2015, including small right greater than left pleural effusions and nonspecific bibasilar patchy opacity. No superimposed pulmonary edema or new cardiopulmonary abnormality. Cardiomegaly.  Calcified aortic atherosclerosis. Electronically Signed   By: Odessa Fleming M.D.   On: 12/09/2015 13:57   Dg Chest 2 View  Result Date: 11/25/2015 CLINICAL DATA:  Short of breath for 1 month.  Worse this morning. EXAM: CHEST  2 VIEW COMPARISON:  09/07/2015 and back to 01/12/2015. FINDINGS: Lateral view degraded by patient arm position. Midline trachea. Mild  cardiomegaly. Atherosclerosis in the transverse aorta. Small right pleural effusion is similar to on the prior exam. No pneumothorax. No left-sided pleural fluid. Patchy right base airspace disease is slightly decreased since the prior. there may be inferior right upper lobe pulmonary opacity. Clear left lung. Pulmonary interstitial thickening is lower lobe predominant. IMPRESSION: 1. Since 09/07/2015, persistent or recurrent right-sided pleural fluid with improved right base airspace disease. Possible inferior right upper lobe airspace disease. If the patient has infectious symptoms, this could represent recurrent or residual pneumonia. If this is the case, then follow-up radiographs are recommended in 3-4 weeks after appropriate antibiotic therapy to confirm resolution. Especially if there are not infectious symptoms, an underlying obstructive lesion would be a concern and CT (ideally contrast-enhanced) suggested. 2.  Aortic atherosclerosis. 3. Peribronchial thickening which may relate to chronic bronchitis or smoking. Electronically Signed   By: [name] [name] Greaves M.D.   On: 11/25/2015  12:13   Ct Chest W Contrast  Result Date: 11/25/2015 CLINICAL DATA:  Abdominal chest x-ray, wheezing, low oxygen saturation EXAM: CT CHEST WITH CONTRAST TECHNIQUE: Multidetector CT imaging of the chest was performed during intravenous contrast administration. CONTRAST:  75mL ISOVUE-300 IOPAMIDOL (ISOVUE-300) INJECTION 61% COMPARISON:  Chest x-ray of 11/25/2015 and 09/07/2015 FINDINGS: Cardiovascular: The heart is mildly enlarged. There are diffuse coronary artery calcifications present. No pericardial effusion is seen. The mid ascending thoracic aorta measures 32 mm in diameter. The pulmonary arteries opacify with no acute abnormality. Mediastinum/Nodes: There are somewhat prominent mediastinal lymph nodes present. A pretracheal lymph node measures 8 mm in short axis diameter on image 48 series 2. A precarinal lymph node measures 16  mm in diameter on image 56. No definite hilar adenopathy is seen. Lungs/Pleura: There are bilateral pleural effusions present right greater than left. Compressive atelectasis is noted primarily in the lower lobes right greater than left. Pneumonia cannot be excluded particularly in the right lower lobe anterolaterally. No suspicious lung nodule or mass is seen. Linear areas of atelectasis or scarring in noted in the right middle lobe and lingula as well. Upper Abdomen: A probable left adrenal adenoma is present. Low-attenuation splenic lesions are present which may represent incidental cysts or hemangiomas but are difficult to assess on this CT of the chest. Musculoskeletal: No compression deformity of the thoracic spine is seen. The sternum is intact. IMPRESSION: 1. Severe thoracic aortic atherosclerosis diffusely. 2. Diffuse coronary artery calcifications. 3. Slightly prominent mediastinal lymph nodes. Possibly inflammatory or infectious, but a neoplastic process cannot be excluded. 4. Probable compressive atelectasis right greater than left at the lung bases, but pneumonia cannot be excluded. 5. Low-attenuation splenic lesions may represent cysts or hemangiomas but are not well assessed on this CT of the chest. 6. Bilateral pleural effusions right greater than left. Electronically Signed   By: Dwyane Dee M.D.   On: 11/25/2015 14:04    Microbiology: No results found for this or any previous visit (from the past 240 hour(s)).   Labs: Basic Metabolic Panel:  Recent Labs Lab 12/09/15 1410 12/09/15 2136 12/10/15 0608 12/10/15 1149 12/11/15 1021  NA 135  --  135  --  132*  K 4.6  --  3.8  --  3.8  CL 98*  --  96*  --  97*  CO2 28  --  29  --  26  GLUCOSE 370* 390* 491* 378* 392*  BUN 17  --  20  --  26*  CREATININE 0.81  --  1.00  --  1.02*  CALCIUM 9.2  --  9.0  --  9.2   Liver Function Tests:  Recent Labs Lab 12/11/15 1021  AST 17  ALT 20  ALKPHOS 54  BILITOT 0.7  PROT 6.9  ALBUMIN  3.8   No results for input(s): LIPASE, AMYLASE in the last 168 hours. No results for input(s): AMMONIA in the last 168 hours. CBC:  Recent Labs Lab 12/09/15 1410 12/10/15 0608 12/11/15 1021  WBC 15.2* 9.5 16.9*  NEUTROABS 14.2*  --   --   HGB 11.8* 11.0* 11.1*  HCT 37.6 35.1* 35.7*  MCV 96.4 96.2 96.5  PLT 270 245 269   Cardiac Enzymes:  Recent Labs Lab 12/09/15 1410  TROPONINI <0.03   BNP: BNP (last 3 results)  Recent Labs  09/07/15 1228 11/25/15 1210 12/09/15 1410  BNP 297.0* 218.0* 331.0*    ProBNP (last 3 results) No results for input(s): PROBNP in the last  8760 hours.  CBG:  Recent Labs Lab 12/10/15 1128 12/10/15 1607 12/10/15 2112 12/11/15 0832 12/11/15 1128  GLUCAP 407* 333* 381* 386* 333*       Signed:  Brilyn Tuller MD.  [facility] 12/11/2015, 2:38 PM

## 2015-12-11 NOTE — Progress Notes (Addendum)
Inpatient Diabetes Program Recommendations  AACE/ADA: New Consensus Statement on Inpatient Glycemic Control (2015)  Target Ranges:  Prepandial:   less than 140 mg/dL      Peak postprandial:   less than 180 mg/dL (1-2 hours)      Critically ill patients:  140 - 180 mg/dL   Results for [name], Oneita O (MRN [id number]) as of 12/11/2015 12:20  Ref. Range 12/10/2015 08:13 12/10/2015 11:28 12/10/2015 16:07 12/10/2015 21:12  Glucose-Capillary Latest Ref Range: 65 - 99 mg/dL 782375 (H) 956407 (H) 213333 (H) 381 (H)   Results for [name], Anahit O (MRN [id number]) as of 12/11/2015 12:20  Ref. Range 12/11/2015 08:32 12/11/2015 11:28  Glucose-Capillary Latest Ref Range: 65 - 99 mg/dL 629386 (H) 528333 (H)    Home DM Meds: Glipizide 10 mg BID  Current Insulin Orders: Lantus 15 units BID      Novolog Resistant Correction Scale/ SSI (0-20 units) TID AC      Novolog 8 units TIDWC     -Note Lantus increased to 15 units BID yesterday and Novolog meal Coverage increased to 8 units TIDWC yesterday as well.  -RNs have been teaching pt how to draw up and give insulin incase pt needs insulin at time of d/c.  -Patient still getting IV Solumedrol 60 mg BID.     MD- Please consider the following in-hospital insulin adjustments while patient getting IV steroids:  1. Increase Lantus to 18 units BID  2. Increase Novolog Meal Coverage to 10 units TIDWC     --Will follow patient during hospitalization--  Ambrose FinlandJeannine Johnston Akeiba Axelson RN, MSN, CDE Diabetes Coordinator Inpatient Glycemic Control Team Team Pager: [phone] (8a-5p)

## 2015-12-14 ENCOUNTER — Encounter (HOSPITAL_COMMUNITY): Payer: Self-pay | Admitting: Emergency Medicine

## 2015-12-14 ENCOUNTER — Emergency Department (HOSPITAL_COMMUNITY)
Admission: EM | Admit: 2015-12-14 | Discharge: 2015-12-15 | Disposition: A | Discharge status: Home / Self Care | Payer: Medicare Other | Attending: Emergency Medicine | Admitting: Emergency Medicine

## 2015-12-14 DIAGNOSIS — Z87891 Personal history of nicotine dependence: Secondary | ICD-10-CM | POA: Insufficient documentation

## 2015-12-14 DIAGNOSIS — I110 Hypertensive heart disease with heart failure: Secondary | ICD-10-CM | POA: Diagnosis not present

## 2015-12-14 DIAGNOSIS — I2510 Atherosclerotic heart disease of native coronary artery without angina pectoris: Secondary | ICD-10-CM | POA: Diagnosis not present

## 2015-12-14 DIAGNOSIS — E039 Hypothyroidism, unspecified: Secondary | ICD-10-CM | POA: Diagnosis not present

## 2015-12-14 DIAGNOSIS — I5032 Chronic diastolic (congestive) heart failure: Secondary | ICD-10-CM | POA: Diagnosis not present

## 2015-12-14 DIAGNOSIS — Z79899 Other long term (current) drug therapy: Secondary | ICD-10-CM | POA: Insufficient documentation

## 2015-12-14 DIAGNOSIS — E1165 Type 2 diabetes mellitus with hyperglycemia: Secondary | ICD-10-CM | POA: Diagnosis present

## 2015-12-14 DIAGNOSIS — Z794 Long term (current) use of insulin: Secondary | ICD-10-CM | POA: Insufficient documentation

## 2015-12-14 DIAGNOSIS — J449 Chronic obstructive pulmonary disease, unspecified: Secondary | ICD-10-CM | POA: Diagnosis not present

## 2015-12-14 DIAGNOSIS — R739 Hyperglycemia, unspecified: Secondary | ICD-10-CM

## 2015-12-14 LAB — CBG MONITORING, ED
Glucose-Capillary: 427 mg/dL — ABNORMAL HIGH (ref 65–99)
Glucose-Capillary: 510 mg/dL (ref 65–99)

## 2015-12-14 LAB — CBC
HEMATOCRIT: 36.8 % (ref 36.0–46.0)
Hemoglobin: 11.8 g/dL — ABNORMAL LOW (ref 12.0–15.0)
MCH: 30.3 pg (ref 26.0–34.0)
MCHC: 32.1 g/dL (ref 30.0–36.0)
MCV: 94.6 fL (ref 78.0–100.0)
Platelets: 205 10*3/uL (ref 150–400)
RBC: 3.89 MIL/uL (ref 3.87–5.11)
RDW: 14.3 % (ref 11.5–15.5)
WBC: 9.9 10*3/uL (ref 4.0–10.5)

## 2015-12-14 NOTE — ED Triage Notes (Signed)
Pt reports she was recently released from the the hospital. CBG elevated. 510 in Triage. Pt was given prednisone while in the hospital.

## 2015-12-14 NOTE — ED Provider Notes (Signed)
[facility]-EMERGENCY DEPT Provider Note   CSN: [id number] Arrival date & time: 12/14/15  2151  By signing my name below, I, Vista Mink, attest that this documentation has been prepared under the direction and in the presence of Zadie Rhine, MD. Electronically signed, Vista Mink, ED Scribe. 12/14/15. 11:54 PM.  History   Chief Complaint Chief Complaint  Patient presents with  . Hyperglycemia    HPI [name] [name] is a 75 y.o. female.  The history is provided by the patient. No language interpreter was used.  Hyperglycemia  Blood sugar level PTA:  500 Onset quality:  Gradual Duration:  8 hours Progression:  Improving Diabetes status:  Controlled with insulin Relieved by:  Insulin Associated symptoms: fatigue, increased thirst and shortness of breath   Associated symptoms: no abdominal pain, no chest pain and no fever   Risk factors: recent steroid use     Past Medical History:  Diagnosis Date  . CAD S/P percutaneous coronary angioplasty 2005, 2012   RCA DES,CFX DES-low risk Nuc June 2016  . Cardiomyopathy (HCC)    EF 65-70% Aug 2016 echo  . Carotid artery disease (HCC)   . Chronic a-fib (HCC)    Eliquis started July 2016  . COPD (chronic obstructive pulmonary disease) (HCC)    Home O2  . Essential hypertension, benign   . Hyperlipidemia   . Hypothyroidism   . Sinus bradycardia    HR 30-40s with associated lightheadedness/presyncoe. AVN blockers held with improvement.  . Type 2 diabetes mellitus [facility])     Patient Active Problem List   Diagnosis Date Noted  . COPD (chronic obstructive pulmonary disease) (HCC) 12/09/2015  . Diastolic CHF, acute on chronic (HCC)   . Uncontrolled type 2 diabetes mellitus with complication (HCC)   . Uncontrolled type 2 diabetes mellitus with complication, without long-term current use of insulin (HCC)   . Acute on chronic respiratory failure with hypoxia (HCC)   . CAP (community acquired pneumonia)   . Acute on chronic diastolic CHF  (congestive heart failure) (HCC)   . Other specified hypothyroidism   . Chronic atrial fibrillation (HCC) 09/07/2015  . Atrial fibrillation with RVR (HCC) 09/07/2015  . Chronic anticoagulation-CHADs VASc=6 09/07/2015  . Type 2 diabetes with decreased circulation (HCC) 09/07/2015  . Sinus bradycardia 09/07/2012  . Paroxysmal atrial fibrillation (HCC) 09/07/2012  . TOBACCO ABUSE 04/09/2009  . CAROTID ARTERY DISEASE 04/09/2009  . COPD with acute exacerbation (HCC) 04/09/2009  . Dyslipidemia 11/20/2008  . Essential hypertension, benign 11/20/2008  . CAD S/P percutaneous coronary angioplasty 11/20/2008    Past Surgical History:  Procedure Laterality Date  . ABDOMINAL HYSTERECTOMY    . APPENDECTOMY    . CHOLECYSTECTOMY    . Excision of Melanoma  04/29/2010   Lower left neck    OB History    Gravida Para Term Preterm AB Living   2 2 2          SAB TAB Ectopic Multiple Live Births                   Home Medications    Prior to Admission medications   Medication Sig Start Date End Date Taking? Authorizing Provider  ALPRAZolam Prudy Feeler) 0.5 MG tablet Take 0.5 mg by mouth 3 (three) times daily as needed for anxiety.    Historical Provider, MD  dexlansoprazole (DEXILANT) 60 MG capsule Take 60 mg by mouth daily.      Historical Provider, MD  diltiazem (CARDIZEM CD) 360 MG 24 hr capsule Take  1 capsule (360 mg total) by mouth daily. 09/11/15   Drema Dallas, MD  ELIQUIS 5 MG TABS tablet TAKE 1 TABLET BY MOUTH TWICE DAILY 09/30/14   Rollene Rotunda, MD  Fluticasone Furoate-Vilanterol (BREO ELLIPTA) 100-25 MCG/INH AEPB Inhale 1 puff into the lungs daily as needed (wheezing/shortness of breath).     Historical Provider, MD  furosemide (LASIX) 40 MG tablet Take 1 tablet (40 mg total) by mouth daily. 11/28/15   Houston Siren, MD  glipiZIDE (GLUCOTROL) 10 MG tablet Take 1 tablet (10 mg total) by mouth 2 (two) times daily before a meal. Do not take glipizide if blood sugar falls below 125. 12/11/15   Elliot Cousin, MD  HYDROcodone-acetaminophen (NORCO/VICODIN) 5-325 MG tablet Take 1 tablet by mouth 2 (two) times daily as needed for moderate pain.    Historical Provider, MD  insulin glargine (LANTUS) 100 UNIT/ML injection Inject 0.15 mLs (15 Units total) into the skin 2 (two) times daily. 12/11/15   Elliot Cousin, MD  Insulin Syringes, Disposable, U-100 1 ML MISC 15 Units by Does not apply route 2 (two) times daily. 12/11/15   Elliot Cousin, MD  levalbuterol Pauline Aus) 0.63 MG/3ML nebulizer solution Take 3 mLs (0.63 mg total) by nebulization every 3 (three) hours as needed for wheezing or shortness of breath. 09/11/15   Drema Dallas, MD  levofloxacin (LEVAQUIN) 500 MG tablet Take 1 tablet (500 mg total) by mouth daily. Antibiotic. Starting 12/12/15 take 1 tablet daily until completed. 12/11/15   Elliot Cousin, MD  levothyroxine (SYNTHROID, LEVOTHROID) 88 MCG tablet Take 88 mcg by mouth daily before breakfast.    Historical Provider, MD  meclizine (ANTIVERT) 25 MG tablet Take 12.5 mg by mouth daily as needed for dizziness.    Historical Provider, MD  metoprolol tartrate (LOPRESSOR) 25 MG tablet Take 25 mg by mouth 2 (two) times daily.    Historical Provider, MD  nitroGLYCERIN (NITROSTAT) 0.4 MG SL tablet Place 1 tablet (0.4 mg total) under the tongue every 5 (five) minutes x 3 doses as needed for chest pain. For severe chest pain 08/07/14   Jonelle Sidle, MD  OXYGEN Inhale 2-3 L into the lungs continuous.    Historical Provider, MD  predniSONE (DELTASONE) 10 MG tablet Starting 12/12/15, take 6 tablets daily for 1 day; then 5 tablets the next day for 1 day; then 4 tablets the next day for 1 day; then 3 tablets the next day for 1 day; then 2 tablets the next day for 1 day; then 1 tablet the next day for 1 day; then stop. 12/11/15   Elliot Cousin, MD  rosuvastatin (CRESTOR) 10 MG tablet Take 1 tablet (10 mg total) by mouth daily. Patient taking differently: Take 10 mg by mouth at bedtime.  10/05/10   June Leap, MD  Vitamin D, Ergocalciferol, (DRISDOL) 50000 units CAPS capsule Take 50,000 Units by mouth every 7 (seven) days. Takes on Mondays.    Historical Provider, MD    Family History Family History  Problem Relation Age of Onset  . Colon cancer Mother   . Throat cancer Father   . Heart disease Brother     Social History Social History  Substance Use Topics  . Smoking status: Former Smoker    Packs/day: 0.50    Years: 45.00    Types: Cigarettes    Start date: 02/29/1964    Quit date: 06/08/2012  . Smokeless tobacco: Never Used  . Alcohol use No  Allergies   Adalat [nifedipine]; Claritin-d 12 hour [loratadine-pseudoephedrine er]; Codeine; Plavix [clopidogrel bisulfate]; Wellbutrin [bupropion]; Ciprofloxacin; and Metformin and related   Review of Systems Review of Systems  Constitutional: Positive for fatigue. Negative for fever.  Respiratory: Positive for shortness of breath and wheezing.   Cardiovascular: Negative for chest pain.  Gastrointestinal: Negative for abdominal pain.  Endocrine: Positive for polydipsia.  All other systems reviewed and are negative.    Physical Exam Updated Vital Signs BP 148/93 (BP Location: Right Arm)   Pulse 62   Temp 97.8 F (36.6 C) (Oral)   Resp 17   Ht 5\' 2"  (1.575 m)   Wt 157 lb (71.2 kg)   SpO2 97%   BMI 28.72 kg/m   Physical Exam CONSTITUTIONAL: Elderly and frail. HEAD: Normocephalic/atraumatic EYES: EOMI/PERRL ENMT: Mucous membranes moist NECK: supple no meningeal signs SPINE/BACK:entire spine nontender CV: S1/S2 noted, no murmurs/rubs/gallops noted LUNGS: Wheezing bilaterally ABDOMEN: soft, nontender GU:no cva tenderness NEURO: Pt is awake/alert/appropriate, moves all extremitiesx4.  No facial droop.   EXTREMITIES: pulses normal/equal, full ROM SKIN: warm, color normal PSYCH: no abnormalities of mood noted, alert and oriented to situation  ED Treatments / Results  DIAGNOSTIC STUDIES: Oxygen Saturation is 97%  on RA, normal by my interpretation.  COORDINATION OF CARE: 11:51 PM-Will order insulin to control blood sugar. Discussed treatment plan with pt at bedside and pt agreed to plan.   Labs (all labs ordered are listed, but only abnormal results are displayed) Labs Reviewed  BASIC METABOLIC PANEL - Abnormal; Notable for the following:       Result Value   Sodium 133 (*)    Chloride 92 (*)    CO2 33 (*)    Glucose, Bld 435 (*)    BUN 29 (*)    GFR calc non Af Amer 54 (*)    All other components within normal limits  CBC - Abnormal; Notable for the following:    Hemoglobin 11.8 (*)    All other components within normal limits  URINALYSIS, ROUTINE W REFLEX MICROSCOPIC (NOT AT [facility]) - Abnormal; Notable for the following:    APPearance HAZY (*)    Glucose, UA >1000 (*)    Hgb urine dipstick TRACE (*)    Leukocytes, UA SMALL (*)    All other components within normal limits  URINE MICROSCOPIC-ADD ON - Abnormal; Notable for the following:    Squamous Epithelial / LPF 6-30 (*)    Bacteria, UA MANY (*)    All other components within normal limits  CBG MONITORING, ED - Abnormal; Notable for the following:    Glucose-Capillary 510 (*)    All other components within normal limits  CBG MONITORING, ED - Abnormal; Notable for the following:    Glucose-Capillary 427 (*)    All other components within normal limits  CBG MONITORING, ED - Abnormal; Notable for the following:    Glucose-Capillary 419 (*)    All other components within normal limits  CBG MONITORING, ED    EKG  EKG Interpretation  Date/Time:  Tuesday December 15 2015 00:03:20 EDT Ventricular Rate:  105 PR Interval:    QRS Duration: 98 QT Interval:  349 QTC Calculation: 462 R Axis:   67 Text Interpretation:  Atrial fibrillation Ventricular premature complex Borderline low voltage, extremity leads No significant change since last tracing Confirmed by Bebe Shaggy  MD, Keyden Pavlov ([id number]) on 12/15/2015 12:13:50 AM       Radiology No  results found.  Procedures Procedures (including critical care time)  Medications Ordered in ED Medications  insulin aspart (novoLOG) injection 10 Units (10 Units Subcutaneous Given 12/15/15 0049)     Initial Impression / Assessment and Plan / ED Course  I have reviewed the triage vital signs and the nursing notes.  Pertinent labs results that were available during my care of the patient were reviewed by me and considered in my medical decision making (see chart for details).  Clinical Course    Pt overall well appearing She is NOT in DKA This is likely due to prednisone use for her COPD She is already on lantus/glipizide and she reports compliance I feel it is reasonable to have her increase morning glipizide to 15mg , and then back to 10mg  in the PM She can do this for 3 days but closely monitoring glucose and to stop glipizide if glucose is around 150 She will try to see her PCP this week for recheck She will likely need to back off her diabetic meds once her prednisone is finished but she still needs this due to her COPD Pt/family agreeable with plan   Final Clinical Impressions(s) / ED Diagnoses   Final diagnoses:  Hyperglycemia    New Prescriptions New Prescriptions   No medications on file  I personally performed the services described in this documentation, which was scribed in my presence. The recorded information has been reviewed and is accurate.       Zadie Rhineonald Mikayela Deats, MD 12/15/15 [phone]

## 2015-12-15 DIAGNOSIS — E1165 Type 2 diabetes mellitus with hyperglycemia: Secondary | ICD-10-CM | POA: Diagnosis not present

## 2015-12-15 LAB — CBG MONITORING, ED: GLUCOSE-CAPILLARY: 419 mg/dL — AB (ref 65–99)

## 2015-12-15 LAB — BASIC METABOLIC PANEL
Anion gap: 8 (ref 5–15)
BUN: 29 mg/dL — AB (ref 6–20)
CALCIUM: 9.3 mg/dL (ref 8.9–10.3)
CHLORIDE: 92 mmol/L — AB (ref 101–111)
CO2: 33 mmol/L — ABNORMAL HIGH (ref 22–32)
CREATININE: 1 mg/dL (ref 0.44–1.00)
GFR calc Af Amer: >60 mL/min (ref >60)
GFR calc non Af Amer: 54 mL/min — ABNORMAL LOW (ref >60)
Glucose, Bld: 435 mg/dL — ABNORMAL HIGH (ref 65–99)
Potassium: 3.8 mmol/L (ref 3.5–5.1)
SODIUM: 133 mmol/L — AB (ref 135–145)

## 2015-12-15 LAB — URINE MICROSCOPIC-ADD ON

## 2015-12-15 LAB — URINALYSIS, ROUTINE W REFLEX MICROSCOPIC
BILIRUBIN URINE: NEGATIVE
Glucose, UA: >1000 mg/dL — AB
KETONES UR: NEGATIVE mg/dL
Nitrite: NEGATIVE
PH: 6 (ref 5.0–8.0)
Protein, ur: NEGATIVE mg/dL
SPECIFIC GRAVITY, URINE: 1.01 (ref 1.005–1.030)

## 2015-12-15 MED ORDER — INSULIN ASPART 100 UNIT/ML ~~LOC~~ SOLN
10.0000 [IU] | Freq: Once | SUBCUTANEOUS | Status: AC
  Administered 2015-12-15: 10 [IU] via SUBCUTANEOUS
  Filled 2015-12-15: qty 1

## 2015-12-15 NOTE — Discharge Instructions (Signed)
PLEASE TAKE A TABLET AND A HALF OF GLIPIZIDE (15MG ) EACH MORNING, AND TAKE ONE TABLET OF GLIPIZIDE IN THE EVENING FOR THE NEXT 3 DAYS.  PLEASE CHECK BLOOD SUGAR FREQUENTLY AND DO NOT TAKE GLIPIZIDE IF BLOOD SUGAR IS LESS THAN 150

## 2015-12-15 NOTE — ED Notes (Signed)
Pt alert & oriented x4, stable gait. Patient given discharge instructions, paperwork & prescription(s). Patient  instructed to stop at the registration desk to finish any additional paperwork. Patient verbalized understanding. Pt left department w/ no further questions. 

## 2016-01-05 ENCOUNTER — Emergency Department (HOSPITAL_COMMUNITY): Payer: Medicare Other

## 2016-01-05 ENCOUNTER — Inpatient Hospital Stay (HOSPITAL_COMMUNITY)
Admission: EM | Admit: 2016-01-05 | Discharge: 2016-01-15 | DRG: 193 | Disposition: A | Discharge status: Home Health | Payer: Medicare Other | Attending: Internal Medicine | Admitting: Internal Medicine

## 2016-01-05 ENCOUNTER — Encounter (HOSPITAL_COMMUNITY): Payer: Self-pay | Admitting: *Deleted

## 2016-01-05 DIAGNOSIS — E785 Hyperlipidemia, unspecified: Secondary | ICD-10-CM | POA: Diagnosis present

## 2016-01-05 DIAGNOSIS — Z95818 Presence of other cardiac implants and grafts: Secondary | ICD-10-CM

## 2016-01-05 DIAGNOSIS — J9621 Acute and chronic respiratory failure with hypoxia: Secondary | ICD-10-CM | POA: Diagnosis present

## 2016-01-05 DIAGNOSIS — I4892 Unspecified atrial flutter: Secondary | ICD-10-CM | POA: Diagnosis present

## 2016-01-05 DIAGNOSIS — R000 Tachycardia, unspecified: Secondary | ICD-10-CM | POA: Diagnosis present

## 2016-01-05 DIAGNOSIS — Z9981 Dependence on supplemental oxygen: Secondary | ICD-10-CM | POA: Diagnosis not present

## 2016-01-05 DIAGNOSIS — I480 Paroxysmal atrial fibrillation: Secondary | ICD-10-CM | POA: Diagnosis present

## 2016-01-05 DIAGNOSIS — Z7901 Long term (current) use of anticoagulants: Secondary | ICD-10-CM

## 2016-01-05 DIAGNOSIS — Z959 Presence of cardiac and vascular implant and graft, unspecified: Secondary | ICD-10-CM | POA: Diagnosis not present

## 2016-01-05 DIAGNOSIS — I2510 Atherosclerotic heart disease of native coronary artery without angina pectoris: Secondary | ICD-10-CM | POA: Diagnosis present

## 2016-01-05 DIAGNOSIS — I482 Chronic atrial fibrillation: Secondary | ICD-10-CM | POA: Diagnosis present

## 2016-01-05 DIAGNOSIS — Z881 Allergy status to other antibiotic agents status: Secondary | ICD-10-CM

## 2016-01-05 DIAGNOSIS — G4730 Sleep apnea, unspecified: Secondary | ICD-10-CM | POA: Diagnosis present

## 2016-01-05 DIAGNOSIS — E039 Hypothyroidism, unspecified: Secondary | ICD-10-CM | POA: Diagnosis present

## 2016-01-05 DIAGNOSIS — E118 Type 2 diabetes mellitus with unspecified complications: Secondary | ICD-10-CM

## 2016-01-05 DIAGNOSIS — I110 Hypertensive heart disease with heart failure: Secondary | ICD-10-CM | POA: Diagnosis present

## 2016-01-05 DIAGNOSIS — E876 Hypokalemia: Secondary | ICD-10-CM | POA: Diagnosis present

## 2016-01-05 DIAGNOSIS — I429 Cardiomyopathy, unspecified: Secondary | ICD-10-CM | POA: Diagnosis present

## 2016-01-05 DIAGNOSIS — Z888 Allergy status to other drugs, medicaments and biological substances status: Secondary | ICD-10-CM

## 2016-01-05 DIAGNOSIS — I4891 Unspecified atrial fibrillation: Secondary | ICD-10-CM | POA: Diagnosis not present

## 2016-01-05 DIAGNOSIS — I10 Essential (primary) hypertension: Secondary | ICD-10-CM

## 2016-01-05 DIAGNOSIS — Y95 Nosocomial condition: Secondary | ICD-10-CM | POA: Diagnosis present

## 2016-01-05 DIAGNOSIS — D803 Selective deficiency of immunoglobulin G [IgG] subclasses: Secondary | ICD-10-CM | POA: Diagnosis present

## 2016-01-05 DIAGNOSIS — Z794 Long term (current) use of insulin: Secondary | ICD-10-CM | POA: Diagnosis not present

## 2016-01-05 DIAGNOSIS — E1165 Type 2 diabetes mellitus with hyperglycemia: Secondary | ICD-10-CM | POA: Diagnosis present

## 2016-01-05 DIAGNOSIS — J441 Chronic obstructive pulmonary disease with (acute) exacerbation: Secondary | ICD-10-CM | POA: Diagnosis present

## 2016-01-05 DIAGNOSIS — I5043 Acute on chronic combined systolic (congestive) and diastolic (congestive) heart failure: Secondary | ICD-10-CM | POA: Diagnosis present

## 2016-01-05 DIAGNOSIS — J9602 Acute respiratory failure with hypercapnia: Secondary | ICD-10-CM

## 2016-01-05 DIAGNOSIS — IMO0002 Reserved for concepts with insufficient information to code with codable children: Secondary | ICD-10-CM | POA: Diagnosis present

## 2016-01-05 DIAGNOSIS — J189 Pneumonia, unspecified organism: Secondary | ICD-10-CM | POA: Diagnosis present

## 2016-01-05 DIAGNOSIS — R0600 Dyspnea, unspecified: Secondary | ICD-10-CM | POA: Diagnosis not present

## 2016-01-05 DIAGNOSIS — Z85820 Personal history of malignant melanoma of skin: Secondary | ICD-10-CM

## 2016-01-05 DIAGNOSIS — Z87891 Personal history of nicotine dependence: Secondary | ICD-10-CM | POA: Diagnosis not present

## 2016-01-05 DIAGNOSIS — Z79899 Other long term (current) drug therapy: Secondary | ICD-10-CM

## 2016-01-05 DIAGNOSIS — M6281 Muscle weakness (generalized): Secondary | ICD-10-CM

## 2016-01-05 DIAGNOSIS — R739 Hyperglycemia, unspecified: Secondary | ICD-10-CM

## 2016-01-05 DIAGNOSIS — J9690 Respiratory failure, unspecified, unspecified whether with hypoxia or hypercapnia: Secondary | ICD-10-CM

## 2016-01-05 DIAGNOSIS — Z885 Allergy status to narcotic agent status: Secondary | ICD-10-CM | POA: Diagnosis not present

## 2016-01-05 LAB — BASIC METABOLIC PANEL
ANION GAP: 9 (ref 5–15)
BUN: 29 mg/dL — ABNORMAL HIGH (ref 6–20)
CALCIUM: 9 mg/dL (ref 8.9–10.3)
CO2: 32 mmol/L (ref 22–32)
Chloride: 94 mmol/L — ABNORMAL LOW (ref 101–111)
Creatinine, Ser: 0.93 mg/dL (ref 0.44–1.00)
GFR calc Af Amer: >60 mL/min (ref >60)
GFR calc non Af Amer: 59 mL/min — ABNORMAL LOW (ref >60)
GLUCOSE: 399 mg/dL — AB (ref 65–99)
POTASSIUM: 3.7 mmol/L (ref 3.5–5.1)
Sodium: 135 mmol/L (ref 135–145)

## 2016-01-05 LAB — URINALYSIS, ROUTINE W REFLEX MICROSCOPIC
Bilirubin Urine: NEGATIVE
GLUCOSE, UA: 500 mg/dL — AB
Ketones, ur: NEGATIVE mg/dL
LEUKOCYTES UA: NEGATIVE
Nitrite: NEGATIVE
PH: 6 (ref 5.0–8.0)
Protein, ur: 100 mg/dL — AB
Specific Gravity, Urine: 1.025 (ref 1.005–1.030)

## 2016-01-05 LAB — CBC WITH DIFFERENTIAL/PLATELET
BASOS ABS: 0 10*3/uL (ref 0.0–0.1)
Basophils Relative: 0 %
Eosinophils Absolute: 0.1 10*3/uL (ref 0.0–0.7)
Eosinophils Relative: 1 %
HEMATOCRIT: 42.3 % (ref 36.0–46.0)
Hemoglobin: 12.9 g/dL (ref 12.0–15.0)
LYMPHS ABS: 1.3 10*3/uL (ref 0.7–4.0)
LYMPHS PCT: 10 %
MCH: 29.9 pg (ref 26.0–34.0)
MCHC: 30.5 g/dL (ref 30.0–36.0)
MCV: 97.9 fL (ref 78.0–100.0)
MONO ABS: 0.8 10*3/uL (ref 0.1–1.0)
Monocytes Relative: 6 %
NEUTROS ABS: 11.2 10*3/uL — AB (ref 1.7–7.7)
Neutrophils Relative %: 83 %
Platelets: 302 10*3/uL (ref 150–400)
RBC: 4.32 MIL/uL (ref 3.87–5.11)
RDW: 14.7 % (ref 11.5–15.5)
WBC: 13.5 10*3/uL — ABNORMAL HIGH (ref 4.0–10.5)

## 2016-01-05 LAB — I-STAT TROPONIN, ED: Troponin i, poc: 0.02 ng/mL (ref 0.00–0.08)

## 2016-01-05 LAB — GLUCOSE, CAPILLARY
Glucose-Capillary: 284 mg/dL — ABNORMAL HIGH (ref 65–99)
Glucose-Capillary: 231 mg/dL — ABNORMAL HIGH (ref 65–99)
Glucose-Capillary: 179 mg/dL — ABNORMAL HIGH (ref 65–99)

## 2016-01-05 LAB — EXPECTORATED SPUTUM ASSESSMENT W GRAM STAIN, RFLX TO RESP C

## 2016-01-05 LAB — CG4 I-STAT (LACTIC ACID): LACTIC ACID, VENOUS: 1.98 mmol/L — AB (ref 0.5–1.9)

## 2016-01-05 LAB — URINE MICROSCOPIC-ADD ON: WBC, UA: NONE SEEN WBC/hpf (ref 0–5)

## 2016-01-05 LAB — EXPECTORATED SPUTUM ASSESSMENT W REFEX TO RESP CULTURE

## 2016-01-05 LAB — STREP PNEUMONIAE URINARY ANTIGEN: STREP PNEUMO URINARY ANTIGEN: NEGATIVE

## 2016-01-05 LAB — MRSA PCR SCREENING: MRSA by PCR: NEGATIVE

## 2016-01-05 MED ORDER — IPRATROPIUM BROMIDE 0.02 % IN SOLN
0.5000 mg | RESPIRATORY_TRACT | Status: DC
  Administered 2016-01-05 – 2016-01-09 (×28): 0.5 mg via RESPIRATORY_TRACT
  Filled 2016-01-05 (×28): qty 2.5

## 2016-01-05 MED ORDER — INSULIN ASPART 100 UNIT/ML ~~LOC~~ SOLN
0.0000 [IU] | Freq: Three times a day (TID) | SUBCUTANEOUS | Status: DC
  Administered 2016-01-05: 7 [IU] via SUBCUTANEOUS
  Administered 2016-01-05: 11 [IU] via SUBCUTANEOUS
  Administered 2016-01-06: 3 [IU] via SUBCUTANEOUS
  Administered 2016-01-06: 7 [IU] via SUBCUTANEOUS
  Administered 2016-01-06: 11 [IU] via SUBCUTANEOUS
  Administered 2016-01-07: 20 [IU] via SUBCUTANEOUS
  Administered 2016-01-07 (×2): 11 [IU] via SUBCUTANEOUS
  Administered 2016-01-08: 20 [IU] via SUBCUTANEOUS
  Administered 2016-01-08: 15 [IU] via SUBCUTANEOUS
  Administered 2016-01-09: 22 [IU] via SUBCUTANEOUS
  Administered 2016-01-09 (×2): 11 [IU] via SUBCUTANEOUS
  Administered 2016-01-10: 15 [IU] via SUBCUTANEOUS
  Administered 2016-01-10: 4 [IU] via SUBCUTANEOUS
  Administered 2016-01-10: 20 [IU] via SUBCUTANEOUS
  Administered 2016-01-11: 4 [IU] via SUBCUTANEOUS
  Administered 2016-01-11: 11 [IU] via SUBCUTANEOUS
  Administered 2016-01-11: 4 [IU] via SUBCUTANEOUS
  Administered 2016-01-12: 20 [IU] via SUBCUTANEOUS
  Administered 2016-01-12: 4 [IU] via SUBCUTANEOUS
  Administered 2016-01-13: 11 [IU] via SUBCUTANEOUS
  Administered 2016-01-13: 3 [IU] via SUBCUTANEOUS
  Administered 2016-01-13: 11 [IU] via SUBCUTANEOUS
  Administered 2016-01-14: 7 [IU] via SUBCUTANEOUS
  Administered 2016-01-14: 3 [IU] via SUBCUTANEOUS
  Administered 2016-01-14: 15 [IU] via SUBCUTANEOUS
  Administered 2016-01-15: 11 [IU] via SUBCUTANEOUS

## 2016-01-05 MED ORDER — GUAIFENESIN ER 600 MG PO TB12
1200.0000 mg | ORAL_TABLET | Freq: Two times a day (BID) | ORAL | Status: DC
  Administered 2016-01-05 – 2016-01-15 (×21): 1200 mg via ORAL
  Filled 2016-01-05 (×21): qty 2

## 2016-01-05 MED ORDER — FLUTICASONE FUROATE-VILANTEROL 100-25 MCG/INH IN AEPB
1.0000 | INHALATION_SPRAY | Freq: Every day | RESPIRATORY_TRACT | Status: DC | PRN
  Filled 2016-01-05: qty 28

## 2016-01-05 MED ORDER — METHYLPREDNISOLONE SODIUM SUCC 125 MG IJ SOLR
125.0000 mg | Freq: Once | INTRAMUSCULAR | Status: AC
  Administered 2016-01-05: 125 mg via INTRAVENOUS
  Filled 2016-01-05: qty 2

## 2016-01-05 MED ORDER — DILTIAZEM HCL 100 MG IV SOLR
5.0000 mg/h | INTRAVENOUS | Status: DC
  Administered 2016-01-05 (×2): 15 mg/h via INTRAVENOUS
  Administered 2016-01-05: 5 mg/h via INTRAVENOUS
  Administered 2016-01-06 (×2): 10 mg/h via INTRAVENOUS
  Administered 2016-01-07 (×2): 15 mg/h via INTRAVENOUS
  Filled 2016-01-05 (×8): qty 100

## 2016-01-05 MED ORDER — SODIUM CHLORIDE 0.9% FLUSH
3.0000 mL | INTRAVENOUS | Status: DC | PRN
Start: 2016-01-05 — End: 2016-01-14

## 2016-01-05 MED ORDER — INSULIN ASPART 100 UNIT/ML ~~LOC~~ SOLN: 0.0000 [IU] | SUBCUTANEOUS | Status: DC

## 2016-01-05 MED ORDER — HYDROCODONE-ACETAMINOPHEN 5-325 MG PO TABS
1.0000 | ORAL_TABLET | Freq: Two times a day (BID) | ORAL | Status: DC | PRN
  Administered 2016-01-14 – 2016-01-15 (×2): 1 via ORAL
  Filled 2016-01-05 (×2): qty 1

## 2016-01-05 MED ORDER — ALBUTEROL (5 MG/ML) CONTINUOUS INHALATION SOLN
10.0000 mg/h | INHALATION_SOLUTION | Freq: Once | RESPIRATORY_TRACT | Status: AC
  Administered 2016-01-05: 10 mg/h via RESPIRATORY_TRACT
  Filled 2016-01-05: qty 20

## 2016-01-05 MED ORDER — SODIUM CHLORIDE 0.9 % IV BOLUS (SEPSIS)
500.0000 mL | Freq: Once | INTRAVENOUS | Status: AC
Start: 2016-01-05 — End: 2016-01-05
  Administered 2016-01-05: 500 mL via INTRAVENOUS

## 2016-01-05 MED ORDER — SODIUM CHLORIDE 0.9% FLUSH
3.0000 mL | Freq: Two times a day (BID) | INTRAVENOUS | Status: DC
  Administered 2016-01-05 – 2016-01-13 (×18): 3 mL via INTRAVENOUS

## 2016-01-05 MED ORDER — LEVOTHYROXINE SODIUM 88 MCG PO TABS
88.0000 ug | ORAL_TABLET | Freq: Every day | ORAL | Status: DC
  Administered 2016-01-06 – 2016-01-15 (×10): 88 ug via ORAL
  Filled 2016-01-05 (×10): qty 1

## 2016-01-05 MED ORDER — VANCOMYCIN HCL IN DEXTROSE 750-5 MG/150ML-% IV SOLN
750.0000 mg | Freq: Two times a day (BID) | INTRAVENOUS | Status: DC
  Administered 2016-01-05 – 2016-01-09 (×8): 750 mg via INTRAVENOUS
  Filled 2016-01-05 (×12): qty 150

## 2016-01-05 MED ORDER — METOPROLOL TARTRATE 25 MG PO TABS
25.0000 mg | ORAL_TABLET | Freq: Two times a day (BID) | ORAL | Status: DC
  Administered 2016-01-05 – 2016-01-08 (×7): 25 mg via ORAL
  Filled 2016-01-05 (×7): qty 1

## 2016-01-05 MED ORDER — DILTIAZEM LOAD VIA INFUSION
10.0000 mg | Freq: Once | INTRAVENOUS | Status: AC
  Administered 2016-01-05: 10 mg via INTRAVENOUS
  Filled 2016-01-05: qty 10

## 2016-01-05 MED ORDER — PANTOPRAZOLE SODIUM 40 MG PO TBEC
40.0000 mg | DELAYED_RELEASE_TABLET | Freq: Every day | ORAL | Status: DC
  Administered 2016-01-05 – 2016-01-15 (×11): 40 mg via ORAL
  Filled 2016-01-05 (×11): qty 1

## 2016-01-05 MED ORDER — VANCOMYCIN HCL IN DEXTROSE 1-5 GM/200ML-% IV SOLN
1000.0000 mg | Freq: Once | INTRAVENOUS | Status: AC
  Administered 2016-01-05: 1000 mg via INTRAVENOUS
  Filled 2016-01-05: qty 200

## 2016-01-05 MED ORDER — SODIUM CHLORIDE 0.9 % IV SOLN
INTRAVENOUS | Status: AC
  Administered 2016-01-05: 07:00:00 via INTRAVENOUS

## 2016-01-05 MED ORDER — LEVALBUTEROL HCL 0.63 MG/3ML IN NEBU
0.6300 mg | INHALATION_SOLUTION | RESPIRATORY_TRACT | Status: DC
Start: 2016-01-05 — End: 2016-01-10
  Administered 2016-01-05 – 2016-01-09 (×28): 0.63 mg via RESPIRATORY_TRACT
  Filled 2016-01-05 (×28): qty 3

## 2016-01-05 MED ORDER — INSULIN ASPART 100 UNIT/ML ~~LOC~~ SOLN
0.0000 [IU] | Freq: Every day | SUBCUTANEOUS | Status: DC
  Administered 2016-01-06: 4 [IU] via SUBCUTANEOUS
  Administered 2016-01-08: 3 [IU] via SUBCUTANEOUS
  Administered 2016-01-10 – 2016-01-11 (×2): 5 [IU] via SUBCUTANEOUS
  Administered 2016-01-12 – 2016-01-13 (×2): 3 [IU] via SUBCUTANEOUS
  Administered 2016-01-14: 5 [IU] via SUBCUTANEOUS

## 2016-01-05 MED ORDER — MECLIZINE HCL 12.5 MG PO TABS: 12.5000 mg | ORAL_TABLET | Freq: Every day | ORAL | Status: DC | PRN

## 2016-01-05 MED ORDER — SODIUM CHLORIDE 0.9 % IV SOLN
250.0000 mL | INTRAVENOUS | Status: DC | PRN
  Administered 2016-01-12: 250 mL via INTRAVENOUS

## 2016-01-05 MED ORDER — DEXTROSE 5 % IV SOLN
1.0000 g | Freq: Two times a day (BID) | INTRAVENOUS | Status: DC
  Administered 2016-01-05 – 2016-01-09 (×8): 1 g via INTRAVENOUS
  Filled 2016-01-05 (×12): qty 1

## 2016-01-05 MED ORDER — FUROSEMIDE 40 MG PO TABS
40.0000 mg | ORAL_TABLET | Freq: Every day | ORAL | Status: DC
  Administered 2016-01-05 – 2016-01-08 (×4): 40 mg via ORAL
  Filled 2016-01-05 (×4): qty 1

## 2016-01-05 MED ORDER — DEXTROSE 5 % IV SOLN
2.0000 g | Freq: Once | INTRAVENOUS | Status: AC
  Administered 2016-01-05: 2 g via INTRAVENOUS
  Filled 2016-01-05: qty 2

## 2016-01-05 MED ORDER — INSULIN ASPART 100 UNIT/ML ~~LOC~~ SOLN
15.0000 [IU] | Freq: Once | SUBCUTANEOUS | Status: AC
  Administered 2016-01-05: 15 [IU] via SUBCUTANEOUS
  Filled 2016-01-05: qty 1

## 2016-01-05 MED ORDER — IPRATROPIUM-ALBUTEROL 0.5-2.5 (3) MG/3ML IN SOLN: 3.0000 mL | RESPIRATORY_TRACT | Status: DC | PRN

## 2016-01-05 MED ORDER — ROSUVASTATIN CALCIUM 10 MG PO TABS
10.0000 mg | ORAL_TABLET | Freq: Every day | ORAL | Status: DC
  Administered 2016-01-05 – 2016-01-14 (×10): 10 mg via ORAL
  Filled 2016-01-05 (×10): qty 1

## 2016-01-05 MED ORDER — APIXABAN 5 MG PO TABS
5.0000 mg | ORAL_TABLET | Freq: Two times a day (BID) | ORAL | Status: DC
  Administered 2016-01-05 – 2016-01-15 (×21): 5 mg via ORAL
  Filled 2016-01-05 (×21): qty 1

## 2016-01-05 MED ORDER — METHYLPREDNISOLONE SODIUM SUCC 125 MG IJ SOLR
60.0000 mg | Freq: Four times a day (QID) | INTRAMUSCULAR | Status: DC
  Administered 2016-01-05 – 2016-01-11 (×24): 60 mg via INTRAVENOUS
  Filled 2016-01-05 (×25): qty 2

## 2016-01-05 MED ORDER — INSULIN GLARGINE 100 UNIT/ML ~~LOC~~ SOLN
15.0000 [IU] | Freq: Two times a day (BID) | SUBCUTANEOUS | Status: DC
  Administered 2016-01-05 – 2016-01-09 (×10): 15 [IU] via SUBCUTANEOUS
  Filled 2016-01-05 (×13): qty 0.15

## 2016-01-05 MED ORDER — LEVALBUTEROL HCL 0.63 MG/3ML IN NEBU: 0.6300 mg | INHALATION_SOLUTION | RESPIRATORY_TRACT | Status: DC | PRN

## 2016-01-05 MED ORDER — ALPRAZOLAM 0.5 MG PO TABS
0.5000 mg | ORAL_TABLET | Freq: Three times a day (TID) | ORAL | Status: DC | PRN
  Administered 2016-01-06 – 2016-01-15 (×17): 0.5 mg via ORAL
  Filled 2016-01-05 (×17): qty 1

## 2016-01-05 NOTE — ED Provider Notes (Addendum)
[facility]Y DEPT Provider Note   CSN: [id number] Arrival date & time: 01/05/16  0420     History   Chief Complaint Chief Complaint  Patient presents with  . Shortness of Breath    HPI [name] [name] is a 75 y.o. female.  Patient presents to the emergency department for evaluation of shortness of breath. Patient reports that she has been having difficulty with her breathing for approximately 1 week. She reports progressive worsening of her difficulty breathing, wheezing at home tonight. She has had a productive cough. No fever. Patient reports being diagnosed with pneumonia multiple times recently.      Past Medical History:  Diagnosis Date  . CAD S/P percutaneous coronary angioplasty 2005, 2012   RCA DES,CFX DES-low risk Nuc June 2016  . Cardiomyopathy (HCC)    EF 65-70% Aug 2016 echo  . Carotid artery disease (HCC)   . Chronic a-fib (HCC)    Eliquis started July 2016  . COPD (chronic obstructive pulmonary disease) (HCC)    Home O2  . Essential hypertension, benign   . Hyperlipidemia   . Hypothyroidism   . Sinus bradycardia    HR 30-40s with associated lightheadedness/presyncoe. AVN blockers held with improvement.  . Type 2 diabetes mellitus [facility](HCC)     Patient Active Problem List   Diagnosis Date Noted  . COPD (chronic obstructive pulmonary disease) (HCC) 12/09/2015  . Diastolic CHF, acute on chronic (HCC)   . Uncontrolled type 2 diabetes mellitus with complication (HCC)   . Uncontrolled type 2 diabetes mellitus with complication, without long-term current use of insulin (HCC)   . Acute on chronic respiratory failure with hypoxia (HCC)   . CAP (community acquired pneumonia)   . Acute on chronic diastolic CHF (congestive heart failure) (HCC)   . Other specified hypothyroidism   . Chronic atrial fibrillation (HCC) 09/07/2015  . Atrial fibrillation with RVR (HCC) 09/07/2015  . Chronic anticoagulation-CHADs VASc=6 09/07/2015  . Type 2 diabetes with decreased  circulation (HCC) 09/07/2015  . Sinus bradycardia 09/07/2012  . Paroxysmal atrial fibrillation (HCC) 09/07/2012  . TOBACCO ABUSE 04/09/2009  . CAROTID ARTERY DISEASE 04/09/2009  . COPD with acute exacerbation (HCC) 04/09/2009  . Dyslipidemia 11/20/2008  . Essential hypertension, benign 11/20/2008  . CAD S/P percutaneous coronary angioplasty 11/20/2008    Past Surgical History:  Procedure Laterality Date  . ABDOMINAL HYSTERECTOMY    . APPENDECTOMY    . CHOLECYSTECTOMY    . Excision of Melanoma  04/29/2010   Lower left neck    OB History    Gravida Para Term Preterm AB Living   2 2 2          SAB TAB Ectopic Multiple Live Births                   Home Medications    Prior to Admission medications   Medication Sig Start Date End Date Taking? Authorizing Provider  ALPRAZolam Prudy Feeler(XANAX) 0.5 MG tablet Take 0.5 mg by mouth 3 (three) times daily as needed for anxiety.    Historical Provider, MD  dexlansoprazole (DEXILANT) 60 MG capsule Take 60 mg by mouth daily.      Historical Provider, MD  diltiazem (CARDIZEM CD) 360 MG 24 hr capsule Take 1 capsule (360 mg total) by mouth daily. 09/11/15   Drema Dallasurtis J Woods, MD  ELIQUIS 5 MG TABS tablet TAKE 1 TABLET BY MOUTH TWICE DAILY 09/30/14   Rollene RotundaJames Hochrein, MD  Fluticasone Furoate-Vilanterol (BREO ELLIPTA) 100-25 MCG/INH AEPB Inhale 1 puff  into the lungs daily as needed (wheezing/shortness of breath).     Historical Provider, MD  furosemide (LASIX) 40 MG tablet Take 1 tablet (40 mg total) by mouth daily. 11/28/15   Houston Siren, MD  glipiZIDE (GLUCOTROL) 10 MG tablet Take 1 tablet (10 mg total) by mouth 2 (two) times daily before a meal. Do not take glipizide if blood sugar falls below 125. 12/11/15   Elliot Cousin, MD  HYDROcodone-acetaminophen (NORCO/VICODIN) 5-325 MG tablet Take 1 tablet by mouth 2 (two) times daily as needed for moderate pain.    Historical Provider, MD  insulin glargine (LANTUS) 100 UNIT/ML injection Inject 0.15 mLs (15 Units total)  into the skin 2 (two) times daily. 12/11/15   Elliot Cousin, MD  Insulin Syringes, Disposable, U-100 1 ML MISC 15 Units by Does not apply route 2 (two) times daily. 12/11/15   Elliot Cousin, MD  levalbuterol Pauline Aus) 0.63 MG/3ML nebulizer solution Take 3 mLs (0.63 mg total) by nebulization every 3 (three) hours as needed for wheezing or shortness of breath. 09/11/15   Drema Dallas, MD  levofloxacin (LEVAQUIN) 500 MG tablet Take 1 tablet (500 mg total) by mouth daily. Antibiotic. Starting 12/12/15 take 1 tablet daily until completed. 12/11/15   Elliot Cousin, MD  levothyroxine (SYNTHROID, LEVOTHROID) 88 MCG tablet Take 88 mcg by mouth daily before breakfast.    Historical Provider, MD  meclizine (ANTIVERT) 25 MG tablet Take 12.5 mg by mouth daily as needed for dizziness.    Historical Provider, MD  metoprolol tartrate (LOPRESSOR) 25 MG tablet Take 25 mg by mouth 2 (two) times daily.    Historical Provider, MD  nitroGLYCERIN (NITROSTAT) 0.4 MG SL tablet Place 1 tablet (0.4 mg total) under the tongue every 5 (five) minutes x 3 doses as needed for chest pain. For severe chest pain 08/07/14   Jonelle Sidle, MD  OXYGEN Inhale 2-3 L into the lungs continuous.    Historical Provider, MD  predniSONE (DELTASONE) 10 MG tablet Starting 12/12/15, take 6 tablets daily for 1 day; then 5 tablets the next day for 1 day; then 4 tablets the next day for 1 day; then 3 tablets the next day for 1 day; then 2 tablets the next day for 1 day; then 1 tablet the next day for 1 day; then stop. 12/11/15   Elliot Cousin, MD  rosuvastatin (CRESTOR) 10 MG tablet Take 1 tablet (10 mg total) by mouth daily. Patient taking differently: Take 10 mg by mouth at bedtime.  10/05/10   June Leap, MD  Vitamin D, Ergocalciferol, (DRISDOL) 50000 units CAPS capsule Take 50,000 Units by mouth every 7 (seven) days. Takes on Mondays.    Historical Provider, MD    Family History Family History  Problem Relation Age of Onset  . Colon cancer  Mother   . Throat cancer Father   . Heart disease Brother     Social History Social History  Substance Use Topics  . Smoking status: Former Smoker    Packs/day: 0.50    Years: 45.00    Types: Cigarettes    Start date: 02/29/1964    Quit date: 06/08/2012  . Smokeless tobacco: Never Used  . Alcohol use No     Allergies   Adalat [nifedipine]; Claritin-d 12 hour [loratadine-pseudoephedrine er]; Codeine; Plavix [clopidogrel bisulfate]; Wellbutrin [bupropion]; Ciprofloxacin; and Metformin and related   Review of Systems Review of Systems  Respiratory: Positive for cough, shortness of breath and wheezing.   All other systems reviewed  and are negative.    Physical Exam Updated Vital Signs BP (!) 152/109 (BP Location: Left Arm)   Pulse (!) 158   Temp 99.5 F (37.5 C) (Rectal)   Resp (!) 35   Ht 5\' 2"  (1.575 m)   Wt 157 lb (71.2 kg)   SpO2 98%   BMI 28.72 kg/m   Physical Exam  Constitutional: She appears distressed.  Pulmonary/Chest: Accessory muscle usage present. Tachypnea noted. She is in respiratory distress. She has decreased breath sounds. She has wheezes.     ED Treatments / Results  Labs (all labs ordered are listed, but only abnormal results are displayed) Labs Reviewed  BASIC METABOLIC PANEL - Abnormal; Notable for the following:       Result Value   Chloride 94 (*)    Glucose, Bld 399 (*)    BUN 29 (*)    GFR calc non Af Amer 59 (*)    All other components within normal limits  URINALYSIS, ROUTINE W REFLEX MICROSCOPIC (NOT AT [facility]) - Abnormal; Notable for the following:    Glucose, UA 500 (*)    Hgb urine dipstick SMALL (*)    Protein, ur 100 (*)    All other components within normal limits  URINE MICROSCOPIC-ADD ON - Abnormal; Notable for the following:    Squamous Epithelial / LPF 0-5 (*)    Bacteria, UA MANY (*)    All other components within normal limits  URINE CULTURE  CULTURE, BLOOD (ROUTINE X 2)  CULTURE, BLOOD (ROUTINE X 2)  CBC WITH  DIFFERENTIAL/PLATELET  BLOOD GAS, ARTERIAL  I-STAT CG4 LACTIC ACID, ED  I-STAT TROPOININ, ED    EKG  EKG Interpretation  Date/Time:  Tuesday January 05 2016 04:36:15 EST Ventricular Rate:  158 PR Interval:    QRS Duration: 92 QT Interval:  260 QTC Calculation: 422 R Axis:   95 Text Interpretation:  Atrial fibrillation with rapid V-rate Right axis deviation Low voltage, extremity leads Borderline T wave abnormalities Baseline wander in lead(s) III No significant change since last tracing Confirmed by Blinda LeatherwoodPOLLINA  MD, Jamice Carreno [phone]([id number]) on 01/05/2016 4:39:13 AM       Radiology Dg Chest Port 1 View  Result Date: 01/05/2016 CLINICAL DATA:  Shortness of breath.  Recent diagnosis of pneumonia. EXAM: PORTABLE CHEST 1 VIEW COMPARISON:  12/09/2015 FINDINGS: Cardiac enlargement without vascular congestion. Small right pleural effusion with consolidation in the right lung base, increasing since prior study. Findings suggest progression of pneumonia. Left lung is clear. No pneumothorax. Calcified and tortuous aorta. Degenerative changes in the shoulders. IMPRESSION: Small right pleural effusion with increasing consolidation in the right lung base suggesting progressing pneumonia. Followup PA and lateral chest X-ray is recommended in 3-4 weeks following appropriate clinical therapy to ensure resolution and exclude underlying malignancy. Electronically Signed   By: Burman NievesWilliam  Stevens M.D.   On: 01/05/2016 04:52    Procedures Procedures (including critical care time)  Medications Ordered in ED Medications  methylPREDNISolone sodium succinate (SOLU-MEDROL) 125 mg/2 mL injection 125 mg (not administered)  ceFEPIme (MAXIPIME) 2 g in dextrose 5 % 50 mL IVPB (not administered)  vancomycin (VANCOCIN) IVPB 1000 mg/200 mL premix (not administered)  diltiazem (CARDIZEM) 1 mg/mL load via infusion 10 mg (not administered)    And  diltiazem (CARDIZEM) 100 mg in dextrose 5 % 100 mL (1 mg/mL) infusion (not  administered)  insulin aspart (novoLOG) injection 15 Units (not administered)  albuterol (PROVENTIL,VENTOLIN) solution continuous neb (10 mg/hr Nebulization Given 01/05/16 0446)  Initial Impression / Assessment and Plan / ED Course  I have reviewed the triage vital signs and the nursing notes.  Pertinent labs & imaging results that were available during my care of the patient were reviewed by me and considered in my medical decision making (see chart for details).  Clinical Course    Patient presents to the emergency department for evaluation of difficulty breathing. Patient reports that she has been having difficulty breathing for the last week at home. She has a history of severe COPD with multiple hospitalizations recently. She also reports recent pneumonia.  She was in respiratory distress at arrival. Blood gas shows acute respiratory acidosis with hypercapnia. She was placed on BiPAP. Chest x-ray suggests worsening pneumonia. Patient initiated on cefepime and vancomycin empirically. She was afebrile at arrival. Lactate is less than 2. She has a mild leukocytosis. No sign of severe sepsis at this time. Patient has a history of congestive heart failure, fluid administered for hyperglycemia, but judiciously.  Patient was tachycardic at arrival. She does have a history of chronic atrial fibrillation. Heart rate has increased with bronchodilators. She has been as fast as 170 bpm. She was therefore initiated on IV Cardizem for rate control. CHADS-VASc = 6. She is on Eliquis.  CHA2DS2-VASc Score for Atrial Fibrillation Stroke Risk from [email or web address]  on 01/05/2016 ** All calculations should be rechecked by clinician prior to use **  RESULT SUMMARY: 6 points Stroke risk was 9.7% per year in >90,000 patients (the [place] Atrial Fibrillation Cohort Study) and 13.6% risk of stroke/TIA/systemic embolism.  One recommendation suggests a 0 score is "low" risk and may not require anticoagulation; a 1  score is "low-moderate" risk and should consider antiplatelet or anticoagulation, and score 2 or greater is "moderate-high" risk and should otherwise be an anticoagulation candidate.   INPUTS: Age -> 1 = 65-74 Sex -> 1 = Female <abbr title='Congestive heart failure'>CHF</abbr> history -> 1 = Yes Hypertension history -> 1 = Yes Stroke/TIA/Thromboembolism history -> 0 = No Vascular disease history -> 1 = Yes Diabetes history -> 1 = Yes   CRITICAL CARE Performed by: Gilda Crease.   Total critical care time: 35 minutes  Critical care time was exclusive of separately billable procedures and treating other patients.  Critical care was necessary to treat or prevent imminent or life-threatening deterioration.  Critical care was time spent personally by me on the following activities: development of treatment plan with patient and/or surrogate as well as nursing, discussions with consultants, evaluation of patient's response to treatment, examination of patient, obtaining history from patient or surrogate, ordering and performing treatments and interventions, ordering and review of laboratory studies, ordering and review of radiographic studies, pulse oximetry and re-evaluation of patient's condition.   Final Clinical Impressions(s) / ED Diagnoses   Final diagnoses:  Atrial fibrillation with RVR (HCC)  Acute respiratory failure with hypercapnia (HCC)  COPD exacerbation (HCC)  HCAP (healthcare-associated pneumonia)  Hyperglycemia    New Prescriptions New Prescriptions   No medications on file     Gilda Crease, MD 01/05/16 1610    Gilda Crease, MD 01/05/16 [phone]

## 2016-01-05 NOTE — Progress Notes (Signed)
ANTIBIOTIC CONSULT NOTE-Preliminary  Pharmacy Consult for vancomycin, cefepime Indication: pneumonia  Allergies  Allergen Reactions  . Adalat [Nifedipine] Nausea And Vomiting  . Claritin-D 12 Hour [Loratadine-Pseudoephedrine Er] Nausea And Vomiting  . Codeine     REACTION: stomach upset  . Plavix [Clopidogrel Bisulfate] Other (See Comments)    Legs hurt  . Wellbutrin [Bupropion] Other (See Comments)    nightmares  . Ciprofloxacin Nausea And Vomiting  . Metformin And Related Other (See Comments)    Fatigue      Patient Measurements: Height: 5\' 2"  (157.5 cm) Weight: 157 lb (71.2 kg) IBW/kg (Calculated) : 50.1 Adjusted Body Weight:   Vital Signs: Temp: 98.4 F (36.9 C) (11/07 0430) BP: 152/109 (11/07 0430) Pulse Rate: 158 (11/07 0512)  Labs: No results for input(s): WBC, HGB, PLT, LABCREA, CREATININE in the last 72 hours.  CrCl cannot be calculated (Patient's most recent lab result is older than the maximum 21 days allowed.).  No results for input(s): VANCOTROUGH, VANCOPEAK, VANCORANDOM, GENTTROUGH, GENTPEAK, GENTRANDOM, TOBRATROUGH, TOBRAPEAK, TOBRARND, AMIKACINPEAK, AMIKACINTROU, AMIKACIN in the last 72 hours.   Microbiology: No results found for this or any previous visit (from the past 720 hour(s)).  Medical History: Past Medical History:  Diagnosis Date  . CAD S/P percutaneous coronary angioplasty 2005, 2012   RCA DES,CFX DES-low risk Nuc June 2016  . Cardiomyopathy (HCC)    EF 65-70% Aug 2016 echo  . Carotid artery disease (HCC)   . Chronic a-fib (HCC)    Eliquis started July 2016  . COPD (chronic obstructive pulmonary disease) (HCC)    Home O2  . Essential hypertension, benign   . Hyperlipidemia   . Hypothyroidism   . Sinus bradycardia    HR 30-40s with associated lightheadedness/presyncoe. AVN blockers held with improvement.  . Type 2 diabetes mellitus (HCC)     Medications:   (Not in a hospital admission) Scheduled:  . methylPREDNISolone sodium  succinate  125 mg Intravenous Once   Infusions:  . ceFEPime (MAXIPIME) IV    . vancomycin     PRN:  Anti-infectives    Start     Dose/Rate Route Frequency Ordered Stop   01/05/16 0545  ceFEPIme (MAXIPIME) 2 g in dextrose 5 % 50 mL IVPB     2 g 100 mL/hr over 30 Minutes Intravenous  Once 01/05/16 0539     01/05/16 0545  vancomycin (VANCOCIN) IVPB 1000 mg/200 mL premix     1,000 mg 200 mL/hr over 60 Minutes Intravenous  Once 01/05/16 0539        Assessment: 75 yo female in ED with SOB, wheezing, productive cough.  Has been diagnosed with PNA multiple times recently.  Starting vanc and cefepime for presumed PNA.   Goal of Therapy:  Vancomycin trough level 15-20 mcg/ml  Plan:  Preliminary review of pertinent patient information completed.  Protocol will be initiated with a one-time dose(s) of vancomycin 1000mg  IV x 1 and cefepime 2 grams IV x 1.  Jeani HawkingAnnie Penn clinical pharmacist will complete review during morning rounds to assess patient and finalize treatment regimen.  Kyleigha Markert Scarlett, RPH 01/05/2016,5:40 AM

## 2016-01-05 NOTE — Progress Notes (Signed)
Pharmacy Antibiotic Note  [name] [name] is a 75 y.o. female admitted on 01/05/2016 with pneumonia.  Pharmacy has been consulted for Vancomycin and Cefepime dosing.  Plan: Vancomycin 1000mg  given, continue with 750mg   IV every 12 hours.  Goal trough 15-20 mcg/mL.  Cefepime 2gm load, the 1gm IV q12h F/U cultures and clinical progress Monitor V/S and labs Vancomycin trough as indicated  Height: 5\' 2"  (157.5 cm) Weight: 160 lb 4.4 oz (72.7 kg) IBW/kg (Calculated) : 50.1  Temp (24hrs), Avg:99 F (37.2 C), Min:98.4 F (36.9 C), Max:99.5 F (37.5 C)   Recent Labs Lab 01/05/16 0551  WBC 13.5*  CREATININE 0.93    Estimated Creatinine Clearance: 49.5 mL/min (by C-G formula based on SCr of 0.93 mg/dL).    Allergies  Allergen Reactions  . Adalat [Nifedipine] Nausea And Vomiting  . Claritin-D 12 Hour [Loratadine-Pseudoephedrine Er] Nausea And Vomiting  . Codeine     REACTION: stomach upset  . Plavix [Clopidogrel Bisulfate] Other (See Comments)    Legs hurt  . Wellbutrin [Bupropion] Other (See Comments)    nightmares  . Ciprofloxacin Nausea And Vomiting  . Metformin And Related Other (See Comments)    Fatigue      Antimicrobials this admission: Vancomycin 11/7 >>  Cefepime 11/7 >>   Dose adjustments this admission: N/A  Microbiology results: 11/7 BCx: pending 11/7 UCx: in process 11/7 MRSA PCR: pending  Thank you for allowing pharmacy to be a part of this patient's care.  Tera Materoole, Edrees Valent L 01/05/2016 9:35 AM

## 2016-01-05 NOTE — Progress Notes (Signed)
Patient arrived to unit alert on bipap. HR elevated, other vital signs stable.

## 2016-01-05 NOTE — H&P (Signed)
History and Physical    [name] [name] WUJ:[id number] DOB: 1940/09/29 DOA: 01/05/2016  PCP: Ignatius Specking, MD  Patient coming from: home  Chief Complaint: shortness of breath  HPI: [name] [name] is a 75 y.o. female with medical history significant of atrial fibrillation, copd and chronic resp failure on home oxygen. She reports that for the last 2 weeks she has had worsening shortness of breath, wheezing and cough. She denies any fever or chest pain. She has a productive cough and progressive dyspnea on exertion. No vomiting or diarrhea. She was recently discharge on 10/13 after being treated for Pneumonia and COPD exacerbation. She reports compliance with her medications.   ED Course: On arrival to ED she was noted to be in respiratory distress and started on bipap. Chest xray showed right lower lobe consolidation with small right pleural effusion. On blood work she was noted to be hyperglycemic, otherwise unremarkable. She received bronchodilators, antibiotics and steroids. She has been referred for admission  Review of Systems: As per HPI otherwise 10 point review of systems negative.    Past Medical History:  Diagnosis Date  . CAD S/P percutaneous coronary angioplasty 2005, 2012   RCA DES,CFX DES-low risk Nuc June 2016  . Cardiomyopathy (HCC)    EF 65-70% Aug 2016 echo  . Carotid artery disease (HCC)   . Chronic a-fib (HCC)    Eliquis started July 2016  . COPD (chronic obstructive pulmonary disease) (HCC)    Home O2  . Essential hypertension, benign   . Hyperlipidemia   . Hypothyroidism   . Sinus bradycardia    HR 30-40s with associated lightheadedness/presyncoe. AVN blockers held with improvement.  . Type 2 diabetes mellitus (HCC)     Past Surgical History:  Procedure Laterality Date  . ABDOMINAL HYSTERECTOMY    . APPENDECTOMY    . CHOLECYSTECTOMY    . Excision of Melanoma  04/29/2010   Lower left neck     reports that she quit smoking about 3 years ago. Her smoking  use included Cigarettes. She started smoking about 51 years ago. She has a 22.50 pack-year smoking history. She has never used smokeless tobacco. She reports that she does not drink alcohol or use drugs.  Allergies  Allergen Reactions  . Adalat [Nifedipine] Nausea And Vomiting  . Claritin-D 12 Hour [Loratadine-Pseudoephedrine Er] Nausea And Vomiting  . Codeine     REACTION: stomach upset  . Plavix [Clopidogrel Bisulfate] Other (See Comments)    Legs hurt  . Wellbutrin [Bupropion] Other (See Comments)    nightmares  . Ciprofloxacin Nausea And Vomiting  . Metformin And Related Other (See Comments)    Fatigue      Family History  Problem Relation Age of Onset  . Colon cancer Mother   . Throat cancer Father   . Heart disease Brother     Prior to Admission medications   Medication Sig Start Date End Date Taking? Authorizing Provider  ALPRAZolam Prudy Feeler) 0.5 MG tablet Take 0.5 mg by mouth 3 (three) times daily as needed for anxiety.    Historical Provider, MD  dexlansoprazole (DEXILANT) 60 MG capsule Take 60 mg by mouth daily.      Historical Provider, MD  diltiazem (CARDIZEM CD) 360 MG 24 hr capsule Take 1 capsule (360 mg total) by mouth daily. 09/11/15   Drema Dallas, MD  ELIQUIS 5 MG TABS tablet TAKE 1 TABLET BY MOUTH TWICE DAILY 09/30/14   Rollene Rotunda, MD  Fluticasone Furoate-Vilanterol (BREO ELLIPTA) 100-25  MCG/INH AEPB Inhale 1 puff into the lungs daily as needed (wheezing/shortness of breath).     Historical Provider, MD  furosemide (LASIX) 40 MG tablet Take 1 tablet (40 mg total) by mouth daily. 11/28/15   Houston Siren, MD  glipiZIDE (GLUCOTROL) 10 MG tablet Take 1 tablet (10 mg total) by mouth 2 (two) times daily before a meal. Do not take glipizide if blood sugar falls below 125. 12/11/15   Elliot Cousin, MD  HYDROcodone-acetaminophen (NORCO/VICODIN) 5-325 MG tablet Take 1 tablet by mouth 2 (two) times daily as needed for moderate pain.    Historical Provider, MD  insulin glargine  (LANTUS) 100 UNIT/ML injection Inject 0.15 mLs (15 Units total) into the skin 2 (two) times daily. 12/11/15   Elliot Cousin, MD  Insulin Syringes, Disposable, U-100 1 ML MISC 15 Units by Does not apply route 2 (two) times daily. 12/11/15   Elliot Cousin, MD  levalbuterol Pauline Aus) 0.63 MG/3ML nebulizer solution Take 3 mLs (0.63 mg total) by nebulization every 3 (three) hours as needed for wheezing or shortness of breath. 09/11/15   Drema Dallas, MD  levothyroxine (SYNTHROID, LEVOTHROID) 88 MCG tablet Take 88 mcg by mouth daily before breakfast.    Historical Provider, MD  meclizine (ANTIVERT) 25 MG tablet Take 12.5 mg by mouth daily as needed for dizziness.    Historical Provider, MD  metoprolol tartrate (LOPRESSOR) 25 MG tablet Take 25 mg by mouth 2 (two) times daily.    Historical Provider, MD  nitroGLYCERIN (NITROSTAT) 0.4 MG SL tablet Place 1 tablet (0.4 mg total) under the tongue every 5 (five) minutes x 3 doses as needed for chest pain. For severe chest pain 08/07/14   Jonelle Sidle, MD  OXYGEN Inhale 2-3 L into the lungs continuous.    Historical Provider, MD  rosuvastatin (CRESTOR) 10 MG tablet Take 1 tablet (10 mg total) by mouth daily. Patient taking differently: Take 10 mg by mouth at bedtime.  10/05/10   June Leap, MD  Vitamin D, Ergocalciferol, (DRISDOL) 50000 units CAPS capsule Take 50,000 Units by mouth every 7 (seven) days. Takes on Mondays.    Historical Provider, MD    Physical Exam: Vitals:   01/05/16 0713 01/05/16 0745 01/05/16 0800 01/05/16 0828  BP: 124/72  134/78   Pulse:      Resp: 24 (!) 31 (!) 32   Temp:      TempSrc:      SpO2: 98% 97% 97%   Weight:    72.7 kg (160 lb 4.4 oz)  Height:    5\' 2"  (1.575 m)      Constitutional: NAD, calm, on bipap Vitals:   01/05/16 0713 01/05/16 0745 01/05/16 0800 01/05/16 0828  BP: 124/72  134/78   Pulse:      Resp: 24 (!) 31 (!) 32   Temp:      TempSrc:      SpO2: 98% 97% 97%   Weight:    72.7 kg (160 lb 4.4 oz)    Height:    5\' 2"  (1.575 m)   Eyes: PERRL, lids and conjunctivae normal ENMT: Mucous membranes are moist. Posterior pharynx clear of any exudate or lesions.Normal dentition.  Neck: normal, supple, no masses, no thyromegaly Respiratory: diminished breath sounds bilaterally with wheezing. Increased respiratory effort Cardiovascular: irregular, no murmurs / rubs / gallops. No extremity edema. 2+ pedal pulses. No carotid bruits.  Abdomen: no tenderness, no masses palpated. No hepatosplenomegaly. Bowel sounds positive.  Musculoskeletal: no clubbing /  cyanosis. No joint deformity upper and lower extremities. Good ROM, no contractures. Normal muscle tone.  Skin: no rashes, lesions, ulcers. No induration Neurologic: CN 2-12 grossly intact. Sensation intact, DTR normal. Strength 5/5 in all 4.  Psychiatric: Normal judgment and insight. Alert and oriented x 3. Normal mood.     Labs on Admission: I have personally reviewed following labs and imaging studies  CBC:  Recent Labs Lab 01/05/16 0551  WBC 13.5*  NEUTROABS 11.2*  HGB 12.9  HCT 42.3  MCV 97.9  PLT 302   Basic Metabolic Panel:  Recent Labs Lab 01/05/16 0551  NA 135  K 3.7  CL 94*  CO2 32  GLUCOSE 399*  BUN 29*  CREATININE 0.93  CALCIUM 9.0   GFR: Estimated Creatinine Clearance: 49.5 mL/min (by C-G formula based on SCr of 0.93 mg/dL). Liver Function Tests: No results for input(s): AST, ALT, ALKPHOS, BILITOT, PROT, ALBUMIN in the last 168 hours. No results for input(s): LIPASE, AMYLASE in the last 168 hours. No results for input(s): AMMONIA in the last 168 hours. Coagulation Profile: No results for input(s): INR, PROTIME in the last 168 hours. Cardiac Enzymes: No results for input(s): CKTOTAL, CKMB, CKMBINDEX, TROPONINI in the last 168 hours. BNP (last 3 results) No results for input(s): PROBNP in the last 8760 hours. HbA1C: No results for input(s): HGBA1C in the last 72 hours. CBG: No results for input(s): GLUCAP  in the last 168 hours. Lipid Profile: No results for input(s): CHOL, HDL, LDLCALC, TRIG, CHOLHDL, LDLDIRECT in the last 72 hours. Thyroid Function Tests: No results for input(s): TSH, T4TOTAL, FREET4, T3FREE, THYROIDAB in the last 72 hours. Anemia Panel: No results for input(s): VITAMINB12, FOLATE, FERRITIN, TIBC, IRON, RETICCTPCT in the last 72 hours. Urine analysis:    Component Value Date/Time   COLORURINE YELLOW 01/05/2016 0611   APPEARANCEUR CLEAR 01/05/2016 0611   LABSPEC 1.025 01/05/2016 0611   PHURINE 6.0 01/05/2016 0611   GLUCOSEU 500 (A) 01/05/2016 0611   HGBUR SMALL (A) 01/05/2016 0611   BILIRUBINUR NEGATIVE 01/05/2016 0611   KETONESUR NEGATIVE 01/05/2016 0611   PROTEINUR 100 (A) 01/05/2016 0611   NITRITE NEGATIVE 01/05/2016 0611   LEUKOCYTESUR NEGATIVE 01/05/2016 0611   Sepsis Labs: !!!!!!!!!!!!!!!!!!!!!!!!!!!!!!!!!!!!!!!!!!!! @LABRCNTIP (procalcitonin:4,lacticidven:4) )No results found for this or any previous visit (from the past 240 hour(s)).   Radiological Exams on Admission: Dg Chest Port 1 View  Result Date: 01/05/2016 CLINICAL DATA:  Shortness of breath.  Recent diagnosis of pneumonia. EXAM: PORTABLE CHEST 1 VIEW COMPARISON:  12/09/2015 FINDINGS: Cardiac enlargement without vascular congestion. Small right pleural effusion with consolidation in the right lung base, increasing since prior study. Findings suggest progression of pneumonia. Left lung is clear. No pneumothorax. Calcified and tortuous aorta. Degenerative changes in the shoulders. IMPRESSION: Small right pleural effusion with increasing consolidation in the right lung base suggesting progressing pneumonia. Followup PA and lateral chest X-ray is recommended in 3-4 weeks following appropriate clinical therapy to ensure resolution and exclude underlying malignancy. Electronically Signed   By: Burman NievesWilliam  Stevens M.D.   On: 01/05/2016 04:52    EKG: Independently reviewed. A fib rvr  Assessment/Plan Active  Problems:   Essential hypertension, benign   COPD with acute exacerbation (HCC)   Atrial fibrillation with RVR (HCC)   Chronic anticoagulation-CHADs VASc=6   Acute on chronic respiratory failure with hypoxia (HCC)   Uncontrolled type 2 diabetes mellitus with complication (HCC)   HCAP (healthcare-associated pneumonia)   1. Acute on chronic respiratory failure with hypoxia. Likely related to HCAP  and COPD exacerbation. She is chronically on 2-3L at home. Currently on bipap. Will try and wean down as tolerated.  2. HCAP. Vancomycin and cefepime. Urinary antigens have been ordered. Sputum culture has been ordered.  3. COPD exacerbation. Patient was started on intravenous steroids. Continue bronchodilators and mucolytics. She is on antibiotics.  4. Uncontrolled diabetes. Start the patient on Lantus and resistant sliding-scale.  5. A. fib with RVR. CHADSVASc 6. She is anticoagulated with Eliquis. She is currently tachycardic. Her rapid heart rate is likely being driven by her underlying respiratory issues. She is on Cardizem 15 mg per hour and still has an elevated heart rate in the 120s to 130s. Will restart metoprolol. Consult cardiology to further assist with rate control.  6. Hypertension. Blood pressures are currently stable.   DVT prophylaxis: eliquis Code Status: full code Family Communication: no family present Disposition Plan: pending hospital course, may need placement Consults called: cardiology Admission status: inpatient, stepdown   [facility] HospitalMEMON,Taylen Osorto MD [facility] Pager [phone]  If 7PM-7AM, please contact night-coverage [email or web address] Password [email or web address]  01/05/2016, 9:09 AM

## 2016-01-05 NOTE — ED Triage Notes (Signed)
Pt brought in by [facility] for c/o sob; pt was given 2 duonebs by ems en route; pt has been diagnosed with pneumonia twice in the last month; pt's cbg 340 with ems

## 2016-01-06 DIAGNOSIS — I4891 Unspecified atrial fibrillation: Secondary | ICD-10-CM

## 2016-01-06 LAB — COMPREHENSIVE METABOLIC PANEL
ALK PHOS: 93 U/L (ref 38–126)
ALT: 43 U/L (ref 14–54)
AST: 21 U/L (ref 15–41)
Albumin: 3.1 g/dL — ABNORMAL LOW (ref 3.5–5.0)
Anion gap: 6 (ref 5–15)
BILIRUBIN TOTAL: 1.2 mg/dL (ref 0.3–1.2)
BUN: 17 mg/dL (ref 6–20)
CALCIUM: 8.5 mg/dL — AB (ref 8.9–10.3)
CO2: 37 mmol/L — ABNORMAL HIGH (ref 22–32)
CREATININE: 0.78 mg/dL (ref 0.44–1.00)
Chloride: 96 mmol/L — ABNORMAL LOW (ref 101–111)
GFR calc Af Amer: >60 mL/min (ref >60)
Glucose, Bld: 108 mg/dL — ABNORMAL HIGH (ref 65–99)
POTASSIUM: 3.1 mmol/L — AB (ref 3.5–5.1)
Sodium: 139 mmol/L (ref 135–145)
TOTAL PROTEIN: 5.6 g/dL — AB (ref 6.5–8.1)

## 2016-01-06 LAB — GLUCOSE, CAPILLARY
GLUCOSE-CAPILLARY: 147 mg/dL — AB (ref 65–99)
Glucose-Capillary: 267 mg/dL — ABNORMAL HIGH (ref 65–99)
Glucose-Capillary: 232 mg/dL — ABNORMAL HIGH (ref 65–99)
Glucose-Capillary: 326 mg/dL — ABNORMAL HIGH (ref 65–99)

## 2016-01-06 LAB — CBC
HEMATOCRIT: 37.5 % (ref 36.0–46.0)
HEMOGLOBIN: 11.5 g/dL — AB (ref 12.0–15.0)
MCH: 29.7 pg (ref 26.0–34.0)
MCHC: 30.7 g/dL (ref 30.0–36.0)
MCV: 96.9 fL (ref 78.0–100.0)
Platelets: 191 10*3/uL (ref 150–400)
RBC: 3.87 MIL/uL (ref 3.87–5.11)
RDW: 14.5 % (ref 11.5–15.5)
WBC: 9.5 10*3/uL (ref 4.0–10.5)

## 2016-01-06 LAB — LACTIC ACID, PLASMA: LACTIC ACID, VENOUS: 2.1 mmol/L — AB (ref 0.5–1.9)

## 2016-01-06 LAB — URINE CULTURE: CULTURE: NO GROWTH

## 2016-01-06 LAB — HIV ANTIBODY (ROUTINE TESTING W REFLEX): HIV Screen 4th Generation wRfx: NONREACTIVE

## 2016-01-06 MED ORDER — DILTIAZEM HCL 60 MG PO TABS
90.0000 mg | ORAL_TABLET | Freq: Four times a day (QID) | ORAL | Status: DC
  Administered 2016-01-06 – 2016-01-15 (×33): 90 mg via ORAL
  Filled 2016-01-06 (×33): qty 1

## 2016-01-06 MED ORDER — POTASSIUM CHLORIDE CRYS ER 20 MEQ PO TBCR
40.0000 meq | EXTENDED_RELEASE_TABLET | ORAL | Status: AC
  Administered 2016-01-06 (×2): 40 meq via ORAL
  Filled 2016-01-06 (×2): qty 2

## 2016-01-06 NOTE — Care Management Note (Signed)
Case Management Note  Patient Details  Name: [name] [name] MRN: [id number] Date of Birth: 09/08/1940  Subjective/Objective:                  Pt is from home, lives with her son and is ind with ADL's. Pt has PCP, she does not drive but has transportation to appointments. She has no difficulty affording her medications. Pt ambulates independently but has a cane, walker and WC if she needs them. Pt has supplemental oxygen at home prior to admission. Pt interested in qualifying for portable concentrator. CM will discuss with [facility] rep, Alroy BailiffLinda Lothian, Pt plans to return home with self care at DC.   Action/Plan: Will cont to follow.   Expected Discharge Date:    01/08/2016              Expected Discharge Plan:  Home/Self Care  In-House Referral:  NA  Discharge planning Services  CM Consult  Post Acute Care Choice:  NA Choice offered to:  NA  Status of Service:  In process, will continue to follow  Malcolm MetroChildress, [name] Hacker Demske, RN 01/06/2016, 1:26 PM

## 2016-01-06 NOTE — Progress Notes (Signed)
PROGRESS NOTE    [name] [name]  NFA:[id number] DOB: 05/03/1940 DOA: 01/05/2016 PCP: Ignatius Speckinghruv B Vyas, MD    Brief Narrative:  This is 75 year old female with a history of atrial fibrillation, COPD and chronic respiratory failure on home oxygen who presents to the hospital with 2 weeks of worsening shortness of breath. Chest x-ray indicated pneumonia and she was also found to have COPD exacerbation as well as rapid atrial fibrillation. She was placed on BiPAP and admitted to the stepdown unit for further treatment.   Assessment & Plan:   Active Problems:   Essential hypertension, benign   COPD with acute exacerbation (HCC)   Atrial fibrillation with RVR (HCC)   Chronic anticoagulation-CHADs VASc=6   Acute on chronic respiratory failure with hypoxia (HCC)   Uncontrolled type 2 diabetes mellitus with complication (HCC)   HCAP (healthcare-associated pneumonia)  1. Acute on chronic respiratory failure with hypoxia. Likely related to HCAP and COPD exacerbation. She is chronically on 2-3L at home. She required BiPAP on admission but has since been weaned to nasal cannula. We'll continue to follow. Will consult pulmonology.  2. HCAP. Vancomycin and cefepime. Urinary antigens have been ordered. Sputum culture has been ordered. She is coughing up thick green colored sputum  3. COPD exacerbation. Patient was started on intravenous steroids. Continue bronchodilators and mucolytics. She is on antibiotics. She continues to wheeze. Add flutter valve  4. Uncontrolled diabetes. Started the patient on Lantus and resistant sliding-scale. Blood sugars have since improved  5. A. fib with RVR. CHADSVASc 6. She is anticoagulated with Eliquis.  currently on Cardizem infusion, the heart rate is improving since yesterday. Will restart her by mouth Cardizem in the efforts to wean her off Cardizem infusion. Continue metoprolol..  6. Hypertension. Blood pressures are currently stable.   DVT prophylaxis:  eliquis Code Status: full Family Communication: discussed with grand daughter at the bedside Disposition Plan: discharge home once improved   Consultants:   Pulmonology  Procedures:     Antimicrobials:   Vancomycin 11/7>>  cefepime 11/7>>    Subjective: Has wheezing. Producing thick green sputum with cough. Overall breathing is improving.  Objective: Vitals:   01/06/16 0500 01/06/16 0730 01/06/16 0800 01/06/16 0809  BP:   120/78   Pulse:   (!) 37   Resp:   (!) 30   Temp:  97.9 F (36.6 C)    TempSrc:  Axillary    SpO2:   96% 94%  Weight: 74.6 kg (164 lb 7.4 oz)     Height:        Intake/Output Summary (Last 24 hours) at 01/06/16 1107 Last data filed at 01/06/16 0000  Gross per 24 hour  Intake             1005 ml  Output             2200 ml  Net            -1195 ml   Filed Weights   01/05/16 0441 01/05/16 0828 01/06/16 0500  Weight: 71.2 kg (157 lb) 72.7 kg (160 lb 4.4 oz) 74.6 kg (164 lb 7.4 oz)    Examination:  General exam: Appears calm and comfortable  Respiratory system: bilateral wheezes. Respiratory effort normal. Cardiovascular system: S1 & S2 heard, irregular. No JVD, murmurs, rubs, gallops or clicks. No pedal edema. Gastrointestinal system: Abdomen is nondistended, soft and nontender. No organomegaly or masses felt. Normal bowel sounds heard. Central nervous system: Alert and oriented. No focal neurological deficits. Extremities:  Symmetric 5 x 5 power. Skin: No rashes, lesions or ulcers Psychiatry: Judgement and insight appear normal. Mood & affect appropriate.     Data Reviewed: I have personally reviewed following labs and imaging studies  CBC:  Recent Labs Lab 01/05/16 0551 01/06/16 0513  WBC 13.5* 9.5  NEUTROABS 11.2*  --   HGB 12.9 11.5*  HCT 42.3 37.5  MCV 97.9 96.9  PLT 302 191   Basic Metabolic Panel:  Recent Labs Lab 01/05/16 0551 01/06/16 0513  NA 135 139  K 3.7 3.1*  CL 94* 96*  CO2 32 37*  GLUCOSE 399* 108*    BUN 29* 17  CREATININE 0.93 0.78  CALCIUM 9.0 8.5*   GFR: Estimated Creatinine Clearance: 58.3 mL/min (by C-G formula based on SCr of 0.78 mg/dL). Liver Function Tests:  Recent Labs Lab 01/06/16 0513  AST 21  ALT 43  ALKPHOS 93  BILITOT 1.2  PROT 5.6*  ALBUMIN 3.1*   No results for input(s): LIPASE, AMYLASE in the last 168 hours. No results for input(s): AMMONIA in the last 168 hours. Coagulation Profile: No results for input(s): INR, PROTIME in the last 168 hours. Cardiac Enzymes: No results for input(s): CKTOTAL, CKMB, CKMBINDEX, TROPONINI in the last 168 hours. BNP (last 3 results) No results for input(s): PROBNP in the last 8760 hours. HbA1C: No results for input(s): HGBA1C in the last 72 hours. CBG:  Recent Labs Lab 01/05/16 1233 01/05/16 1626 01/05/16 2100 01/06/16 0745  GLUCAP 284* 231* 179* 147*   Lipid Profile: No results for input(s): CHOL, HDL, LDLCALC, TRIG, CHOLHDL, LDLDIRECT in the last 72 hours. Thyroid Function Tests: No results for input(s): TSH, T4TOTAL, FREET4, T3FREE, THYROIDAB in the last 72 hours. Anemia Panel: No results for input(s): VITAMINB12, FOLATE, FERRITIN, TIBC, IRON, RETICCTPCT in the last 72 hours. Sepsis Labs:  Recent Labs Lab 01/05/16 0604  LATICACIDVEN 1.98*    Recent Results (from the past 240 hour(s))  Culture, blood (Routine X 2) w Reflex to ID Panel     Status: None (Preliminary result)   Collection Time: 01/05/16  5:45 AM  Result Value Ref Range Status   Specimen Description BLOOD LEFT ANTECUBITAL  Final   Special Requests BOTTLES DRAWN AEROBIC AND ANAEROBIC 6CC EACH  Final   Culture NO GROWTH 1 DAY  Final   Report Status PENDING  Incomplete  Culture, blood (Routine X 2) w Reflex to ID Panel     Status: None (Preliminary result)   Collection Time: 01/05/16  5:51 AM  Result Value Ref Range Status   Specimen Description   Final    BLOOD BLOOD RIGHT HAND AEROBIC RH BLOOD LEFT HAND ANAEROBIC LH   Special Requests  BOTTLES DRAWN AEROBIC AND ANAEROBIC 6CC EACH  Final   Culture NO GROWTH 1 DAY  Final   Report Status PENDING  Incomplete  Urine culture     Status: None   Collection Time: 01/05/16  6:11 AM  Result Value Ref Range Status   Specimen Description URINE, CATHETERIZED  Final   Special Requests NONE  Final   Culture NO GROWTH [facility]   Final   Report Status 01/06/2016 FINAL  Final  MRSA PCR Screening     Status: None   Collection Time: 01/05/16  8:57 AM  Result Value Ref Range Status   MRSA by PCR NEGATIVE NEGATIVE Final    Comment:        The GeneXpert MRSA Assay (FDA approved for NASAL specimens only), is  one component of a comprehensive MRSA colonization surveillance program. It is not intended to diagnose MRSA infection nor to guide or monitor treatment for MRSA infections.   Culture, blood (routine x 2) Call MD if unable to obtain prior to antibiotics being given     Status: None (Preliminary result)   Collection Time: 01/05/16  9:54 AM  Result Value Ref Range Status   Specimen Description BLOOD RIGHT ANTECUBITAL  Final   Special Requests BOTTLES DRAWN AEROBIC AND ANAEROBIC 10 cc each  Final   Culture NO GROWTH < 24 HOURS  Final   Report Status PENDING  Incomplete  Culture, blood (routine x 2) Call MD if unable to obtain prior to antibiotics being given     Status: None (Preliminary result)   Collection Time: 01/05/16 10:03 AM  Result Value Ref Range Status   Specimen Description BLOOD BLOOD LEFT HAND  Final   Special Requests BOTTLES DRAWN AEROBIC AND ANAEROBIC 6 cc each  Final   Culture NO GROWTH < 24 HOURS  Final   Report Status PENDING  Incomplete  Culture, sputum-assessment     Status: None   Collection Time: 01/05/16  3:00 PM  Result Value Ref Range Status   Specimen Description EXPECTORATED SPUTUM  Final   Special Requests NONE  Final   Sputum evaluation   Final    THIS SPECIMEN IS ACCEPTABLE. RESPIRATORY CULTURE REPORT TO  FOLLOW. Performed at [facility]    Report Status 01/05/2016 FINAL  Final  Culture, respiratory (NON-Expectorated)     Status: None (Preliminary result)   Collection Time: 01/05/16  3:00 PM  Result Value Ref Range Status   Specimen Description EXPECTORATED SPUTUM  Final   Special Requests NONE  Final   Gram Stain   Final    ABUNDANT WBC PRESENT, PREDOMINANTLY PMN ABUNDANT GRAM POSITIVE COCCI IN PAIRS IN CLUSTERS MODERATE GRAM NEGATIVE RODS FEW GRAM NEGATIVE COCCOBACILLI FEW GRAM POSITIVE RODS FEW GRAM VARIABLE ROD FEW SQUAMOUS EPITHELIAL CELLS PRESENT FEW GRAM NEGATIVE DIPLOCOCCI Performed at [facility]    Culture PENDING  Incomplete   Report Status PENDING  Incomplete         Radiology Studies: Dg Chest Port 1 View  Result Date: 01/05/2016 CLINICAL DATA:  Shortness of breath.  Recent diagnosis of pneumonia. EXAM: PORTABLE CHEST 1 VIEW COMPARISON:  12/09/2015 FINDINGS: Cardiac enlargement without vascular congestion. Small right pleural effusion with consolidation in the right lung base, increasing since prior study. Findings suggest progression of pneumonia. Left lung is clear. No pneumothorax. Calcified and tortuous aorta. Degenerative changes in the shoulders. IMPRESSION: Small right pleural effusion with increasing consolidation in the right lung base suggesting progressing pneumonia. Followup PA and lateral chest X-ray is recommended in 3-4 weeks following appropriate clinical therapy to ensure resolution and exclude underlying malignancy. Electronically Signed   By: Burman Nieves M.D.   On: 01/05/2016 04:52        Scheduled Meds: . apixaban  5 mg Oral BID  . ceFEPime (MAXIPIME) IV  1 g Intravenous Q12H  . furosemide  40 mg Oral Daily  . guaiFENesin  1,200 mg Oral BID  . insulin aspart  0-20 Units Subcutaneous TID WC  . insulin aspart  0-5 Units Subcutaneous QHS  . insulin glargine  15 Units Subcutaneous BID  . ipratropium  0.5 mg Nebulization  Q4H  . levalbuterol  0.63 mg Nebulization Q4H  . levothyroxine  88 mcg Oral QAC breakfast  . methylPREDNISolone (SOLU-MEDROL) injection  60 mg  Intravenous Q6H  . metoprolol tartrate  25 mg Oral BID  . pantoprazole  40 mg Oral Daily  . rosuvastatin  10 mg Oral QHS  . sodium chloride flush  3 mL Intravenous Q12H  . vancomycin  750 mg Intravenous Q12H   Continuous Infusions: . diltiazem (CARDIZEM) infusion 10 mg/hr (01/06/16 0228)     LOS: 1 day    Time spent:    Amarian Botero, MD [facility] Pager [phone]  If 7PM-7AM, please contact night-coverage [email or web address] Password [email or web address] 01/06/2016, 11:07 AM

## 2016-01-06 NOTE — Consult Note (Signed)
Consult requested by: Dr. Kerry Hough Consult requested for healthcare associated pneumonia, acute on chronic hypoxic respiratory failure:  HPI: This is a 75 year old who came to the hospital because of increasing shortness of breath. She says at home she has pretty severe shortness of breath can only walk from room to room but now she got to the point that she could not walk across the room. She's been coughing. She's been congested. No chest pain. She's had some swelling she says. She thinks she may have had fever. She's had increasing problems for several weeks. She is bringing up sputum that is dark and thick.  Past Medical History:  Diagnosis Date  . CAD S/P percutaneous coronary angioplasty 2005, 2012   RCA DES,CFX DES-low risk Nuc June 2016  . Cardiomyopathy (HCC)    EF 65-70% Aug 2016 echo  . Carotid artery disease (HCC)   . Chronic a-fib (HCC)    Eliquis started July 2016  . COPD (chronic obstructive pulmonary disease) (HCC)    Home O2  . Essential hypertension, benign   . Hyperlipidemia   . Hypothyroidism   . Sinus bradycardia    HR 30-40s with associated lightheadedness/presyncoe. AVN blockers held with improvement.  . Type 2 diabetes mellitus (HCC)      Family History  Problem Relation Age of Onset  . Colon cancer Mother   . Throat cancer Father   . Heart disease Brother      Social History   Social History  . Marital status: Widowed    Spouse name: N/A  . Number of children: N/A  . Years of education: N/A   Occupational History  . retired    Social History Main Topics  . Smoking status: Former Smoker    Packs/day: 0.50    Years: 45.00    Types: Cigarettes    Start date: 02/29/1964    Quit date: 06/08/2012  . Smokeless tobacco: Never Used  . Alcohol use No  . Drug use: No  . Sexual activity: Not Asked   Other Topics Concern  . None   Social History Narrative  . None     ROS: No chest pain or hemoptysis. No nausea or vomiting. No urinary symptoms. No  fever. No CNS symptoms. Otherwise 10 point review of systems is negative   Objective: Vital signs in last 24 hours: Temp:  [96.4 F (35.8 C)-98 F (36.7 C)] 97 F (36.1 C) (11/08 1155) Pulse Rate:  [37-105] 37 (11/08 0800) Resp:  [14-30] 30 (11/08 0800) BP: (115-128)/(67-86) 120/78 (11/08 0800) SpO2:  [92 %-100 %] 96 % (11/08 1249) FiO2 (%):  [40 %-50 %] 40 % (11/08 0356) Weight:  [74.6 kg (164 lb 7.4 oz)] 74.6 kg (164 lb 7.4 oz) (11/08 0500) Weight change: 1.485 kg (3 lb 4.4 oz) Last BM Date: 01/04/16  Intake/Output from previous day: 11/07 0701 - 11/08 0700 In: 1971.7 [I.V.:1221.7; IV Piggyback:750] Out: 2200 [Urine:2200]  PHYSICAL EXAM Constitutional she is awake and alert and in mild distress due to shortness of breath. She is coughing during the examination. Eyes: External ocular muscles intact. Pupils react. Ears nose mouth and throat mucous membranes are moist. Her throat is clear. She has some nasal congestion. Cardiovascular: Her heart is not regular. Rate is well controlled. No gallop. Respiratory: Her respiratory effort is slightly increased. Lungs show rhonchi and wheezing bilaterally. Gastrointestinal: Her abdomen is soft bowel sounds are present. Musculoskeletal: No swelling of her joints. She does appear to be weak in her muscles. Skin: Warm  and dry. Neurological: No focal abnormalities.  Lab Results: Basic Metabolic Panel:  Recent Labs  16/10/96 0551 01/06/16 0513  NA 135 139  K 3.7 3.1*  CL 94* 96*  CO2 32 37*  GLUCOSE 399* 108*  BUN 29* 17  CREATININE 0.93 0.78  CALCIUM 9.0 8.5*   Liver Function Tests:  Recent Labs  01/06/16 0513  AST 21  ALT 43  ALKPHOS 93  BILITOT 1.2  PROT 5.6*  ALBUMIN 3.1*   No results for input(s): LIPASE, AMYLASE in the last 72 hours. No results for input(s): AMMONIA in the last 72 hours. CBC:  Recent Labs  01/05/16 0551 01/06/16 0513  WBC 13.5* 9.5  NEUTROABS 11.2*  --   HGB 12.9 11.5*  HCT 42.3 37.5  MCV  97.9 96.9  PLT 302 191   Cardiac Enzymes: No results for input(s): CKTOTAL, CKMB, CKMBINDEX, TROPONINI in the last 72 hours. BNP: No results for input(s): PROBNP in the last 72 hours. D-Dimer: No results for input(s): DDIMER in the last 72 hours. CBG:  Recent Labs  01/05/16 1233 01/05/16 1626 01/05/16 2100 01/06/16 0745 01/06/16 1126  GLUCAP 284* 231* 179* 147* 267*   Hemoglobin A1C: No results for input(s): HGBA1C in the last 72 hours. Fasting Lipid Panel: No results for input(s): CHOL, HDL, LDLCALC, TRIG, CHOLHDL, LDLDIRECT in the last 72 hours. Thyroid Function Tests: No results for input(s): TSH, T4TOTAL, FREET4, T3FREE, THYROIDAB in the last 72 hours. Anemia Panel: No results for input(s): VITAMINB12, FOLATE, FERRITIN, TIBC, IRON, RETICCTPCT in the last 72 hours. Coagulation: No results for input(s): LABPROT, INR in the last 72 hours. Urine Drug Screen: Drugs of Abuse  No results found for: LABOPIA, COCAINSCRNUR, LABBENZ, AMPHETMU, THCU, LABBARB  Alcohol Level: No results for input(s): ETH in the last 72 hours. Urinalysis:  Recent Labs  01/05/16 0611  COLORURINE YELLOW  LABSPEC 1.025  PHURINE 6.0  GLUCOSEU 500*  HGBUR SMALL*  BILIRUBINUR NEGATIVE  KETONESUR NEGATIVE  PROTEINUR 100*  NITRITE NEGATIVE  LEUKOCYTESUR NEGATIVE   Misc. Labs:   ABGS: No results for input(s): PHART, PO2ART, TCO2, HCO3 in the last 72 hours.  Invalid input(s): PCO2   MICROBIOLOGY: Recent Results (from the past 240 hour(s))  Culture, blood (Routine X 2) w Reflex to ID Panel     Status: None (Preliminary result)   Collection Time: 01/05/16  5:45 AM  Result Value Ref Range Status   Specimen Description BLOOD LEFT ANTECUBITAL  Final   Special Requests BOTTLES DRAWN AEROBIC AND ANAEROBIC 6CC EACH  Final   Culture NO GROWTH 1 DAY  Final   Report Status PENDING  Incomplete  Culture, blood (Routine X 2) w Reflex to ID Panel     Status: None (Preliminary result)   Collection  Time: 01/05/16  5:51 AM  Result Value Ref Range Status   Specimen Description   Final    BLOOD BLOOD RIGHT HAND AEROBIC RH BLOOD LEFT HAND ANAEROBIC LH   Special Requests BOTTLES DRAWN AEROBIC AND ANAEROBIC 6CC EACH  Final   Culture NO GROWTH 1 DAY  Final   Report Status PENDING  Incomplete  Urine culture     Status: None   Collection Time: 01/05/16  6:11 AM  Result Value Ref Range Status   Specimen Description URINE, CATHETERIZED  Final   Special Requests NONE  Final   Culture NO GROWTH Performed at [facility]   Final   Report Status 01/06/2016 FINAL  Final  MRSA PCR Screening  Status: None   Collection Time: 01/05/16  8:57 AM  Result Value Ref Range Status   MRSA by PCR NEGATIVE NEGATIVE Final    Comment:        The GeneXpert MRSA Assay (FDA approved for NASAL specimens only), is one component of a comprehensive MRSA colonization surveillance program. It is not intended to diagnose MRSA infection nor to guide or monitor treatment for MRSA infections.   Culture, blood (routine x 2) Call MD if unable to obtain prior to antibiotics being given     Status: None (Preliminary result)   Collection Time: 01/05/16  9:54 AM  Result Value Ref Range Status   Specimen Description BLOOD RIGHT ANTECUBITAL  Final   Special Requests BOTTLES DRAWN AEROBIC AND ANAEROBIC 10 cc each  Final   Culture NO GROWTH < 24 HOURS  Final   Report Status PENDING  Incomplete  Culture, blood (routine x 2) Call MD if unable to obtain prior to antibiotics being given     Status: None (Preliminary result)   Collection Time: 01/05/16 10:03 AM  Result Value Ref Range Status   Specimen Description BLOOD BLOOD LEFT HAND  Final   Special Requests BOTTLES DRAWN AEROBIC AND ANAEROBIC 6 cc each  Final   Culture NO GROWTH < 24 HOURS  Final   Report Status PENDING  Incomplete  Culture, sputum-assessment     Status: None   Collection Time: 01/05/16  3:00 PM  Result Value Ref Range Status   Specimen  Description EXPECTORATED SPUTUM  Final   Special Requests NONE  Final   Sputum evaluation   Final    THIS SPECIMEN IS ACCEPTABLE. RESPIRATORY CULTURE REPORT TO FOLLOW. Performed at [facility]    Report Status 01/05/2016 FINAL  Final  Culture, respiratory (NON-Expectorated)     Status: None (Preliminary result)   Collection Time: 01/05/16  3:00 PM  Result Value Ref Range Status   Specimen Description EXPECTORATED SPUTUM  Final   Special Requests NONE  Final   Gram Stain   Final    ABUNDANT WBC PRESENT, PREDOMINANTLY PMN ABUNDANT GRAM POSITIVE COCCI IN PAIRS IN CLUSTERS MODERATE GRAM NEGATIVE RODS FEW GRAM NEGATIVE COCCOBACILLI FEW GRAM POSITIVE RODS FEW GRAM VARIABLE ROD FEW SQUAMOUS EPITHELIAL CELLS PRESENT FEW GRAM NEGATIVE DIPLOCOCCI    Culture   Final    CULTURE REINCUBATED FOR BETTER GROWTH Performed at [facility]    Report Status PENDING  Incomplete    Studies/Results: Dg Chest Port 1 View  Result Date: 01/05/2016 CLINICAL DATA:  Shortness of breath.  Recent diagnosis of pneumonia. EXAM: PORTABLE CHEST 1 VIEW COMPARISON:  12/09/2015 FINDINGS: Cardiac enlargement without vascular congestion. Small right pleural effusion with consolidation in the right lung base, increasing since prior study. Findings suggest progression of pneumonia. Left lung is clear. No pneumothorax. Calcified and tortuous aorta. Degenerative changes in the shoulders. IMPRESSION: Small right pleural effusion with increasing consolidation in the right lung base suggesting progressing pneumonia. Followup PA and lateral chest X-ray is recommended in 3-4 weeks following appropriate clinical therapy to ensure resolution and exclude underlying malignancy. Electronically Signed   By: Burman Nieves M.D.   On: 01/05/2016 04:52    Medications:  Prior to Admission:  Prescriptions Prior to Admission  Medication Sig Dispense Refill Last Dose  . ALPRAZolam (XANAX) 0.5 MG tablet Take 0.5 mg by  mouth 3 (three) times daily as needed for anxiety.   01/04/2016 at Unknown time  . dexlansoprazole (DEXILANT) 60 MG capsule Take  60 mg by mouth daily.     01/04/2016 at Unknown time  . diltiazem (CARDIZEM CD) 360 MG 24 hr capsule Take 1 capsule (360 mg total) by mouth daily. 30 capsule 0 01/04/2016 at Unknown time  . ELIQUIS 5 MG TABS tablet TAKE 1 TABLET BY MOUTH TWICE DAILY 60 tablet 5 01/04/2016 at 1700  . Fluticasone Furoate-Vilanterol (BREO ELLIPTA) 100-25 MCG/INH AEPB Inhale 1 puff into the lungs daily as needed (wheezing/shortness of breath).    unknown  . furosemide (LASIX) 40 MG tablet Take 1 tablet (40 mg total) by mouth daily. 30 tablet 1 01/04/2016 at Unknown time  . glipiZIDE (GLUCOTROL) 10 MG tablet Take 1 tablet (10 mg total) by mouth 2 (two) times daily before a meal. Do not take glipizide if blood sugar falls below 125.   01/04/2016 at Unknown time  . HYDROcodone-acetaminophen (NORCO/VICODIN) 5-325 MG tablet Take 1 tablet by mouth 2 (two) times daily as needed for moderate pain.   unknown  . insulin glargine (LANTUS) 100 UNIT/ML injection Inject 0.15 mLs (15 Units total) into the skin 2 (two) times daily. 10 mL 11 01/04/2016 at Unknown time  . Insulin Syringes, Disposable, U-100 1 ML MISC 15 Units by Does not apply route 2 (two) times daily. 200 each 6 01/04/2016 at Unknown time  . levalbuterol (XOPENEX) 0.63 MG/3ML nebulizer solution Take 3 mLs (0.63 mg total) by nebulization every 3 (three) hours as needed for wheezing or shortness of breath. 3 mL 12 01/04/2016 at Unknown time  . levothyroxine (SYNTHROID, LEVOTHROID) 88 MCG tablet Take 88 mcg by mouth daily before breakfast.   01/04/2016 at Unknown time  . meclizine (ANTIVERT) 25 MG tablet Take 12.5 mg by mouth daily as needed for dizziness.   unknown  . metoprolol tartrate (LOPRESSOR) 25 MG tablet Take 25 mg by mouth 2 (two) times daily.   01/04/2016 at 1700  . nitroGLYCERIN (NITROSTAT) 0.4 MG SL tablet Place 1 tablet (0.4 mg total) under the  tongue every 5 (five) minutes x 3 doses as needed for chest pain. For severe chest pain 25 tablet 3 unknown  . OXYGEN Inhale 2-3 L into the lungs continuous.   01/04/2016 at Unknown time  . rosuvastatin (CRESTOR) 10 MG tablet Take 1 tablet (10 mg total) by mouth daily. (Patient taking differently: Take 10 mg by mouth at bedtime. ) 30 tablet 6 01/04/2016 at Unknown time  . Vitamin D, Ergocalciferol, (DRISDOL) 50000 units CAPS capsule Take 50,000 Units by mouth every 7 (seven) days. Takes on Mondays.   Past Week at Unknown time  . [DISCONTINUED] levofloxacin (LEVAQUIN) 500 MG tablet Take 1 tablet (500 mg total) by mouth daily. Antibiotic. Starting 12/12/15 take 1 tablet daily until completed. 4 tablet 0   . [DISCONTINUED] predniSONE (DELTASONE) 10 MG tablet Starting 12/12/15, take 6 tablets daily for 1 day; then 5 tablets the next day for 1 day; then 4 tablets the next day for 1 day; then 3 tablets the next day for 1 day; then 2 tablets the next day for 1 day; then 1 tablet the next day for 1 day; then stop. 21 tablet 0    Scheduled: . apixaban  5 mg Oral BID  . ceFEPime (MAXIPIME) IV  1 g Intravenous Q12H  . diltiazem  90 mg Oral Q6H  . furosemide  40 mg Oral Daily  . guaiFENesin  1,200 mg Oral BID  . insulin aspart  0-20 Units Subcutaneous TID WC  . insulin aspart  0-5 Units Subcutaneous  QHS  . insulin glargine  15 Units Subcutaneous BID  . ipratropium  0.5 mg Nebulization Q4H  . levalbuterol  0.63 mg Nebulization Q4H  . levothyroxine  88 mcg Oral QAC breakfast  . methylPREDNISolone (SOLU-MEDROL) injection  60 mg Intravenous Q6H  . metoprolol tartrate  25 mg Oral BID  . pantoprazole  40 mg Oral Daily  . potassium chloride  40 mEq Oral Q3H  . rosuvastatin  10 mg Oral QHS  . sodium chloride flush  3 mL Intravenous Q12H  . vancomycin  750 mg Intravenous Q12H   Continuous: . diltiazem (CARDIZEM) infusion 15 mg/hr (01/06/16 1117)   ZOX:WRUEAVPRN:sodium chloride, ALPRAZolam, fluticasone  furoate-vilanterol, HYDROcodone-acetaminophen, levalbuterol, meclizine, sodium chloride flush  Assesment: She has COPD with exacerbation healthcare associated pneumonia and acute on chronic hypoxic respiratory failure. She's required BiPAP but is off now. She says she feels a little better. She is on appropriate treatment for her healthcare associated pneumonia.  She has atrial fib with RVR that's better. She is chronically anticoagulated. Active Problems:   Essential hypertension, benign   COPD with acute exacerbation (HCC)   Atrial fibrillation with RVR (HCC)   Chronic anticoagulation-CHADs VASc=6   Acute on chronic respiratory failure with hypoxia (HCC)   Uncontrolled type 2 diabetes mellitus with complication (HCC)   HCAP (healthcare-associated pneumonia)    Plan: Continue current treatments. Continue IV antibiotics IV steroids inhaled bronchodilators. I will plan to follow with you and see her in my office as an outpatient if she wants to do that    LOS: 1 day   Axiel Fjeld L 01/06/2016, 2:12 PM

## 2016-01-06 NOTE — Progress Notes (Signed)
1144 Received call from lab with Lactic Acid Result - 2.1 MD made aware.

## 2016-01-06 NOTE — Consult Note (Signed)
Cardiology Consultation   Patient ID: [name] [name]; [id number]; [birth year]   Admit date: 01/05/2016 Date of Consult: 01/06/2016  Referring MD:  Dr. Kerry Hough Cardiologist: Dr. Diona Browner Consulting Cardiologist:Dr. Eden Emms  Patient Care Team: Ignatius Specking, MD as PCP - General (Internal Medicine)    Reason for Consultation: A. fib with RVR   History of Present Illness: [name] [name] is a 75 y.o. female with a hx of  chronic hypoxic respiratory failure in the setting of chronic lung disease and diastolic heart failure. She also has PAF on Eliquis and when hospitalized in July her diltiazem was increased to 360 mg daily and metoprolol 50 mg twice a day was added. 2-D echo in July EF 60-65%. Lexi scan Myoview 07/2014 EF 44% no ischemia. History of DES to the RCA and circumflex.  Patient again presents with 2 weeks of worsening shortness of breath chest x-ray indicated pneumonia and she also had COPD exacerbation and rapid atrial fibrillation. She was placed on BiPAP. She is on IV diltiazem and heart rates are still above 100. Patient states she couldn't tolerate metoprolol 50 mg twice a day because of severe headaches and decreased it to 25 mg twice a day on her own. She says her heart rates usually run 88-89 bpm home. It only when up when she developed pneumonia. She's had no trouble with swelling or rapid heart rates until she had this COPD exacerbation.      Past Medical History:  Diagnosis Date  . CAD S/P percutaneous coronary angioplasty 2005, 2012   RCA DES,CFX DES-low risk Nuc June 2016  . Cardiomyopathy (HCC)    EF 65-70% Aug 2016 echo  . Carotid artery disease (HCC)   . Chronic a-fib (HCC)    Eliquis started July 2016  . COPD (chronic obstructive pulmonary disease) (HCC)    Home O2  . Essential hypertension, benign   . Hyperlipidemia   . Hypothyroidism   . Sinus bradycardia    HR 30-40s with associated lightheadedness/presyncoe. AVN blockers held with improvement.  . Type  2 diabetes mellitus (HCC)     Past Surgical History:  Procedure Laterality Date  . ABDOMINAL HYSTERECTOMY    . APPENDECTOMY    . CHOLECYSTECTOMY    . Excision of Melanoma  04/29/2010   Lower left neck      Home Meds: Prior to Admission medications   Medication Sig Start Date End Date Taking? Authorizing Provider  ALPRAZolam Prudy Feeler) 0.5 MG tablet Take 0.5 mg by mouth 3 (three) times daily as needed for anxiety.   Yes Historical Provider, MD  dexlansoprazole (DEXILANT) 60 MG capsule Take 60 mg by mouth daily.     Yes Historical Provider, MD  diltiazem (CARDIZEM CD) 360 MG 24 hr capsule Take 1 capsule (360 mg total) by mouth daily. 09/11/15  Yes Drema Dallas, MD  ELIQUIS 5 MG TABS tablet TAKE 1 TABLET BY MOUTH TWICE DAILY 09/30/14  Yes Rollene Rotunda, MD  Fluticasone Furoate-Vilanterol (BREO ELLIPTA) 100-25 MCG/INH AEPB Inhale 1 puff into the lungs daily as needed (wheezing/shortness of breath).    Yes Historical Provider, MD  furosemide (LASIX) 40 MG tablet Take 1 tablet (40 mg total) by mouth daily. 11/28/15  Yes Houston Siren, MD  glipiZIDE (GLUCOTROL) 10 MG tablet Take 1 tablet (10 mg total) by mouth 2 (two) times daily before a meal. Do not take glipizide if blood sugar falls below 125. 12/11/15  Yes Elliot Cousin, MD  HYDROcodone-acetaminophen (NORCO/VICODIN) 5-325 MG tablet Take 1  tablet by mouth 2 (two) times daily as needed for moderate pain.   Yes Historical Provider, MD  insulin glargine (LANTUS) 100 UNIT/ML injection Inject 0.15 mLs (15 Units total) into the skin 2 (two) times daily. 12/11/15  Yes Elliot Cousin, MD  Insulin Syringes, Disposable, U-100 1 ML MISC 15 Units by Does not apply route 2 (two) times daily. 12/11/15  Yes Elliot Cousin, MD  levalbuterol Pauline Aus) 0.63 MG/3ML nebulizer solution Take 3 mLs (0.63 mg total) by nebulization every 3 (three) hours as needed for wheezing or shortness of breath. 09/11/15  Yes Drema Dallas, MD  levothyroxine (SYNTHROID, LEVOTHROID) 88 MCG  tablet Take 88 mcg by mouth daily before breakfast.   Yes Historical Provider, MD  meclizine (ANTIVERT) 25 MG tablet Take 12.5 mg by mouth daily as needed for dizziness.   Yes Historical Provider, MD  metoprolol tartrate (LOPRESSOR) 25 MG tablet Take 25 mg by mouth 2 (two) times daily.   Yes Historical Provider, MD  nitroGLYCERIN (NITROSTAT) 0.4 MG SL tablet Place 1 tablet (0.4 mg total) under the tongue every 5 (five) minutes x 3 doses as needed for chest pain. For severe chest pain 08/07/14  Yes Jonelle Sidle, MD  OXYGEN Inhale 2-3 L into the lungs continuous.   Yes Historical Provider, MD  rosuvastatin (CRESTOR) 10 MG tablet Take 1 tablet (10 mg total) by mouth daily. Patient taking differently: Take 10 mg by mouth at bedtime.  10/05/10  Yes June Leap, MD  Vitamin D, Ergocalciferol, (DRISDOL) 50000 units CAPS capsule Take 50,000 Units by mouth every 7 (seven) days. Takes on Mondays.   Yes Historical Provider, MD    Current Medications: . apixaban  5 mg Oral BID  . ceFEPime (MAXIPIME) IV  1 g Intravenous Q12H  . diltiazem  90 mg Oral Q6H  . furosemide  40 mg Oral Daily  . guaiFENesin  1,200 mg Oral BID  . insulin aspart  0-20 Units Subcutaneous TID WC  . insulin aspart  0-5 Units Subcutaneous QHS  . insulin glargine  15 Units Subcutaneous BID  . ipratropium  0.5 mg Nebulization Q4H  . levalbuterol  0.63 mg Nebulization Q4H  . levothyroxine  88 mcg Oral QAC breakfast  . methylPREDNISolone (SOLU-MEDROL) injection  60 mg Intravenous Q6H  . metoprolol tartrate  25 mg Oral BID  . pantoprazole  40 mg Oral Daily  . potassium chloride  40 mEq Oral Q3H  . rosuvastatin  10 mg Oral QHS  . sodium chloride flush  3 mL Intravenous Q12H  . vancomycin  750 mg Intravenous Q12H     Allergies:    Allergies  Allergen Reactions  . Adalat [Nifedipine] Nausea And Vomiting  . Claritin-D 12 Hour [Loratadine-Pseudoephedrine Er] Nausea And Vomiting  . Codeine     REACTION: stomach upset  . Plavix  [Clopidogrel Bisulfate] Other (See Comments)    Legs hurt  . Wellbutrin [Bupropion] Other (See Comments)    nightmares  . Ciprofloxacin Nausea And Vomiting  . Metformin And Related Other (See Comments)    Fatigue      Social History:   The patient  reports that she quit smoking about 3 years ago. Her smoking use included Cigarettes. She started smoking about 51 years ago. She has a 22.50 pack-year smoking history. She has never used smokeless tobacco. She reports that she does not drink alcohol or use drugs.    Family History:   The patient's family history includes Colon cancer in her  mother; Heart disease in her brother; Throat cancer in her father.   ROS:  Please see the history of present illness.  Review of Systems  Constitution: Negative.  HENT: Negative.   Eyes: Negative.   Cardiovascular: Positive for dyspnea on exertion, irregular heartbeat and palpitations.  Respiratory: Positive for cough, shortness of breath, sleep disturbances due to breathing and wheezing.   Hematologic/Lymphatic: Negative.   Musculoskeletal: Negative.  Negative for joint pain.  Gastrointestinal: Negative.   Genitourinary: Negative.   Neurological: Negative.    All other ROS reviewed and negative.      Vital Signs: Blood pressure 120/78, pulse (!) 37, temperature 97 F (36.1 C), temperature source Oral, resp. rate (!) 30, height 5\' 2"  (1.575 m), weight 164 lb 7.4 oz (74.6 kg), SpO2 96 %.   PHYSICAL EXAM: General:  Well nourished, well developed, in no acute distress  HEENT: normal Lymph: no adenopathy Neck: no JVD Endocrine:  No thryomegaly Vascular: No carotid bruits; FA pulses 2+ bilaterally without bruits  Cardiac: Irregular irregular; normal S1, S2; no murmur, rub, bruit, thrill, or heave Lungs:  Decreased breath sounds with diffuse expiratory wheezing throughout Abd: soft, nontender, no hepatomegaly  Ext: no edema, Good distal pulses bilaterally Musculoskeletal:  No deformities, BUE and  BLE strength normal and equal Skin: warm and dry  Neuro:  CNs 2-12 intact, no focal abnormalities noted Psych:  Normal affect    EKG:  Atrial fibrillation with nonspecific ST-T wave changes, no acute change  Telemetry: Atrial fibrillation 100-120 bpm  Labs: No results for input(s): CKTOTAL, CKMB, TROPONINI in the last 72 hours. Lab Results  Component Value Date   WBC 9.5 01/06/2016   HGB 11.5 (L) 01/06/2016   HCT 37.5 01/06/2016   MCV 96.9 01/06/2016   PLT 191 01/06/2016    Recent Labs Lab 01/06/16 0513  NA 139  K 3.1*  CL 96*  CO2 37*  BUN 17  CREATININE 0.78  CALCIUM 8.5*  PROT 5.6*  BILITOT 1.2  ALKPHOS 93  ALT 43  AST 21  GLUCOSE 108*   Lab Results  Component Value Date   CHOL 117 09/09/2015   HDL 62 09/09/2015   LDLCALC 45 09/09/2015   TRIG 52 09/09/2015   No results found for: DDIMER  Radiology/Studies:  Dg Chest 2 View  Result Date: 12/09/2015 CLINICAL DATA:  75 year old female with shortness of breath since last night. Initial encounter. EXAM: CHEST  2 VIEW COMPARISON:  Chest CT 11/25/2015 and earlier. FINDINGS: Right greater than left pleural effusions have not significantly changed since September. Stable lung volumes. Patchy right greater than left lung base opacity has not significantly changed since September. No superimposed pneumothorax or edema. Calcified aortic atherosclerosis. Stable cardiomegaly and mediastinal contours. Visualized tracheal air column is within normal limits. Osteopenia. No acute osseous abnormality identified. IMPRESSION: Ventilation appears not significantly changed since the CT on 11/25/2015, including small right greater than left pleural effusions and nonspecific bibasilar patchy opacity. No superimposed pulmonary edema or new cardiopulmonary abnormality. Cardiomegaly.  Calcified aortic atherosclerosis. Electronically Signed   By: Odessa FlemingH  Hall M.D.   On: 12/09/2015 13:57   Dg Chest Port 1 View  Result Date: 01/05/2016 CLINICAL  DATA:  Shortness of breath.  Recent diagnosis of pneumonia. EXAM: PORTABLE CHEST 1 VIEW COMPARISON:  12/09/2015 FINDINGS: Cardiac enlargement without vascular congestion. Small right pleural effusion with consolidation in the right lung base, increasing since prior study. Findings suggest progression of pneumonia. Left lung is clear. No pneumothorax. Calcified and tortuous  aorta. Degenerative changes in the shoulders. IMPRESSION: Small right pleural effusion with increasing consolidation in the right lung base suggesting progressing pneumonia. Followup PA and lateral chest X-ray is recommended in 3-4 weeks following appropriate clinical therapy to ensure resolution and exclude underlying malignancy. Electronically Signed   By: Burman NievesWilliam  Stevens M.D.   On: 01/05/2016 04:52     PROBLEM LIST:  Active Problems:   Essential hypertension, benign   COPD with acute exacerbation (HCC)   Atrial fibrillation with RVR (HCC)   Chronic anticoagulation-CHADs VASc=6   Acute on chronic respiratory failure with hypoxia (HCC)   Uncontrolled type 2 diabetes mellitus with complication (HCC)   HCAP (healthcare-associated pneumonia)     ASSESSMENT AND PLAN:  1. Atrial fibrillation with RVR on Eliquis. Also on IV diltiazem at 15 mg per hour and received oral diltiazem 90 mg every 6 hours today and metoprolol 25 mg twice a day. Could increase IV diltiazem to 30 mg an hour until heart rate better controlled. Will not increase metoprolol at this time with significant wheezing. Dr. Eden EmmsNishan to see.  2. COPD exacerbation and pneumonia  3. chronic diastolic CHF compensated on Lasix 40 mg daily  4. CAD without angina   Signed, Jacolyn ReedyMichele Lenze, PA-C  01/06/2016 12:54 PM    Patient examined chart reviewed. 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 Continue eliquis for stoke prevention   Charlton HawsPeter Broghan Pannone

## 2016-01-07 LAB — GLUCOSE, CAPILLARY
GLUCOSE-CAPILLARY: 270 mg/dL — AB (ref 65–99)
GLUCOSE-CAPILLARY: 179 mg/dL — AB (ref 65–99)
Glucose-Capillary: 295 mg/dL — ABNORMAL HIGH (ref 65–99)
Glucose-Capillary: 398 mg/dL — ABNORMAL HIGH (ref 65–99)

## 2016-01-07 LAB — BASIC METABOLIC PANEL
Anion gap: 7 (ref 5–15)
BUN: 30 mg/dL — AB (ref 6–20)
CALCIUM: 8.6 mg/dL — AB (ref 8.9–10.3)
CHLORIDE: 94 mmol/L — AB (ref 101–111)
CO2: 33 mmol/L — ABNORMAL HIGH (ref 22–32)
CREATININE: 0.98 mg/dL (ref 0.44–1.00)
GFR calc Af Amer: >60 mL/min (ref >60)
GFR calc non Af Amer: 55 mL/min — ABNORMAL LOW (ref >60)
Glucose, Bld: 334 mg/dL — ABNORMAL HIGH (ref 65–99)
Potassium: 3.8 mmol/L (ref 3.5–5.1)
SODIUM: 134 mmol/L — AB (ref 135–145)

## 2016-01-07 LAB — CBC
HCT: 38.6 % (ref 36.0–46.0)
Hemoglobin: 11.8 g/dL — ABNORMAL LOW (ref 12.0–15.0)
MCH: 29.9 pg (ref 26.0–34.0)
MCHC: 30.6 g/dL (ref 30.0–36.0)
MCV: 98 fL (ref 78.0–100.0)
PLATELETS: 230 10*3/uL (ref 150–400)
RBC: 3.94 MIL/uL (ref 3.87–5.11)
RDW: 14.7 % (ref 11.5–15.5)
WBC: 9.3 10*3/uL (ref 4.0–10.5)

## 2016-01-07 LAB — LEGIONELLA PNEUMOPHILA SEROGP 1 UR AG: L. PNEUMOPHILA SEROGP 1 UR AG: NEGATIVE

## 2016-01-07 MED ORDER — CEFEPIME HCL 1 G IJ SOLR
INTRAMUSCULAR | Status: AC
  Filled 2016-01-07: qty 1

## 2016-01-07 MED ORDER — DILTIAZEM HCL 60 MG PO TABS
60.0000 mg | ORAL_TABLET | Freq: Once | ORAL | Status: AC
  Administered 2016-01-07: 60 mg via ORAL
  Filled 2016-01-07: qty 1

## 2016-01-07 NOTE — Progress Notes (Signed)
**Note De-Identified Erminia Mcnew Obfuscation** Patient removed from BIPAP and placed on 6 L [place] with humidification; tolerating well.  RRT to continue to monitor.

## 2016-01-07 NOTE — Progress Notes (Signed)
Subjective: She is on BiPAP now but says she's feeling a little better. She is still coughing up some sputum. She is still short of breath. No fever. Her heart rate is better. She denies chest pain nausea or vomiting.  Objective: Vital signs in last 24 hours: Temp:  [97 F (36.1 C)-97.7 F (36.5 C)] 97.2 F (36.2 C) (11/09 0400) Pulse Rate:  [37-135] 77 (11/09 0700) Resp:  [21-36] 21 (11/09 0700) BP: (91-144)/(60-121) 128/78 (11/09 0700) SpO2:  [94 %-99 %] 97 % (11/09 0700) FiO2 (%):  [40 %] 40 % (11/09 0452) Weight:  [74.6 kg (164 lb 7.4 oz)] 74.6 kg (164 lb 7.4 oz) (11/09 0500) Weight change: 1.9 kg (4 lb 3 oz) Last BM Date: 01/07/16 (per pt,per staff)  Intake/Output from previous day: 11/08 0701 - 11/09 0700 In: 1080 [P.O.:1080] Out: 1200 [Urine:1200]  PHYSICAL EXAM General appearance: alert, cooperative and On BiPAP Resp: She still has bilateral rhonchi Cardio: irregularly irregular rhythm GI: soft, non-tender; bowel sounds normal; no masses,  no organomegaly Extremities: extremities normal, atraumatic, no cyanosis or edema Skin warm and dry. Mucous membranes are moist  Lab Results:  Results for orders placed or performed during the hospital encounter of 01/05/16 (from the past 48 hour(s))  MRSA PCR Screening     Status: None   Collection Time: 01/05/16  8:57 AM  Result Value Ref Range   MRSA by PCR NEGATIVE NEGATIVE    Comment:        The GeneXpert MRSA Assay (FDA approved for NASAL specimens only), is one component of a comprehensive MRSA colonization surveillance program. It is not intended to diagnose MRSA infection nor to guide or monitor treatment for MRSA infections.   HIV antibody     Status: None   Collection Time: 01/05/16  9:05 AM  Result Value Ref Range   HIV Screen 4th Generation wRfx Non Reactive Non Reactive    Comment: (NOTE) Performed At: [facility] [place], [place] [id number] Lindon Romp MD [id number]    Culture, blood (routine x 2) Call MD if unable to obtain prior to antibiotics being given     Status: None (Preliminary result)   Collection Time: 01/05/16  9:54 AM  Result Value Ref Range   Specimen Description BLOOD RIGHT ANTECUBITAL    Special Requests BOTTLES DRAWN AEROBIC AND ANAEROBIC 10 cc each    Culture NO GROWTH < 24 HOURS    Report Status PENDING   Culture, blood (routine x 2) Call MD if unable to obtain prior to antibiotics being given     Status: None (Preliminary result)   Collection Time: 01/05/16 10:03 AM  Result Value Ref Range   Specimen Description BLOOD BLOOD LEFT HAND    Special Requests BOTTLES DRAWN AEROBIC AND ANAEROBIC 6 cc each    Culture NO GROWTH < 24 HOURS    Report Status PENDING   Glucose, capillary     Status: Abnormal   Collection Time: 01/05/16 12:33 PM  Result Value Ref Range   Glucose-Capillary 284 (H) 65 - 99 mg/dL  Culture, sputum-assessment     Status: None   Collection Time: 01/05/16  3:00 PM  Result Value Ref Range   Specimen Description EXPECTORATED SPUTUM    Special Requests NONE    Sputum evaluation      THIS SPECIMEN IS ACCEPTABLE. RESPIRATORY CULTURE REPORT TO FOLLOW. Performed at [facility]    Report Status 01/05/2016 FINAL   Culture, respiratory (NON-Expectorated)  Status: None (Preliminary result)   Collection Time: 01/05/16  3:00 PM  Result Value Ref Range   Specimen Description EXPECTORATED SPUTUM    Special Requests NONE    Gram Stain      ABUNDANT WBC PRESENT, PREDOMINANTLY PMN ABUNDANT GRAM POSITIVE COCCI IN PAIRS IN CLUSTERS MODERATE GRAM NEGATIVE RODS FEW GRAM NEGATIVE COCCOBACILLI FEW GRAM POSITIVE RODS FEW GRAM VARIABLE ROD FEW SQUAMOUS EPITHELIAL CELLS PRESENT FEW GRAM NEGATIVE DIPLOCOCCI    Culture      CULTURE REINCUBATED FOR BETTER GROWTH Performed at [facility]    Report Status PENDING   Strep pneumoniae urinary antigen     Status: None   Collection Time: 01/05/16  3:53 PM  Result  Value Ref Range   Strep Pneumo Urinary Antigen NEGATIVE NEGATIVE    Comment: Performed at [facility]        Infection due to S. pneumoniae cannot be absolutely ruled out since the antigen present may be below the detection limit of the test.   Glucose, capillary     Status: Abnormal   Collection Time: 01/05/16  4:26 PM  Result Value Ref Range   Glucose-Capillary 231 (H) 65 - 99 mg/dL  Glucose, capillary     Status: Abnormal   Collection Time: 01/05/16  9:00 PM  Result Value Ref Range   Glucose-Capillary 179 (H) 65 - 99 mg/dL   Comment 1 Notify RN   CBC     Status: Abnormal   Collection Time: 01/06/16  5:13 AM  Result Value Ref Range   WBC 9.5 4.0 - 10.5 K/uL   RBC 3.87 3.87 - 5.11 MIL/uL   Hemoglobin 11.5 ([name]) 12.0 - 15.0 g/dL   HCT 37.5 36.0 - 46.0 %   MCV 96.9 78.0 - 100.0 fL   MCH 29.7 26.0 - 34.0 pg   MCHC 30.7 30.0 - 36.0 g/dL   RDW 14.5 11.5 - 15.5 %   Platelets 191 150 - 400 K/uL  Comprehensive metabolic panel     Status: Abnormal   Collection Time: 01/06/16  5:13 AM  Result Value Ref Range   Sodium 139 135 - 145 mmol/[name]   Potassium 3.1 ([name]) 3.5 - 5.1 mmol/[name]   Chloride 96 ([name]) 101 - 111 mmol/[name]   CO2 37 (H) 22 - 32 mmol/[name]   Glucose, Bld 108 (H) 65 - 99 mg/dL   BUN 17 6 - 20 mg/dL   Creatinine, Ser 0.78 0.44 - 1.00 mg/dL   Calcium 8.5 ([name]) 8.9 - 10.3 mg/dL   Total Protein 5.6 ([name]) 6.5 - 8.1 g/dL   Albumin 3.1 ([name]) 3.5 - 5.0 g/dL   AST 21 15 - 41 U/[name]   ALT 43 14 - 54 U/[name]   Alkaline Phosphatase 93 38 - 126 U/[name]   Total Bilirubin 1.2 0.3 - 1.2 mg/dL   GFR calc non Af Amer >60 >60 mL/min   GFR calc Af Amer >60 >60 mL/min    Comment: (NOTE) The eGFR has been calculated using the CKD EPI equation. This calculation has not been validated in all clinical situations. eGFR's persistently <60 mL/min signify possible Chronic Kidney Disease.    Anion gap 6 5 - 15  Glucose, capillary     Status: Abnormal   Collection Time: 01/06/16  7:45 AM  Result Value Ref Range    Glucose-Capillary 147 (H) 65 - 99 mg/dL   Comment 1 Notify RN    Comment 2 Document in Chart   Lactic acid, plasma  Status: Abnormal   Collection Time: 01/06/16 10:51 AM  Result Value Ref Range   Lactic Acid, Venous 2.1 (HH) 0.5 - 1.9 mmol/[name]    Comment: CRITICAL RESULT CALLED TO, READ BACK BY AND VERIFIED WITH: MCGIBBINEY,C AT 11:45AM ON 01/06/16 BY FESTERMAN,C   Glucose, capillary     Status: Abnormal   Collection Time: 01/06/16 11:26 AM  Result Value Ref Range   Glucose-Capillary 267 (H) 65 - 99 mg/dL   Comment 1 Notify RN    Comment 2 Document in Chart   Glucose, capillary     Status: Abnormal   Collection Time: 01/06/16  4:31 PM  Result Value Ref Range   Glucose-Capillary 232 (H) 65 - 99 mg/dL   Comment 1 Notify RN    Comment 2 Document in Chart   Glucose, capillary     Status: Abnormal   Collection Time: 01/06/16 10:31 PM  Result Value Ref Range   Glucose-Capillary 326 (H) 65 - 99 mg/dL  Basic metabolic panel     Status: Abnormal   Collection Time: 01/07/16  5:55 AM  Result Value Ref Range   Sodium 134 ([name]) 135 - 145 mmol/[name]   Potassium 3.8 3.5 - 5.1 mmol/[name]    Comment: DELTA CHECK NOTED   Chloride 94 ([name]) 101 - 111 mmol/[name]   CO2 33 (H) 22 - 32 mmol/[name]   Glucose, Bld 334 (H) 65 - 99 mg/dL   BUN 30 (H) 6 - 20 mg/dL   Creatinine, Ser 0.98 0.44 - 1.00 mg/dL   Calcium 8.6 ([name]) 8.9 - 10.3 mg/dL   GFR calc non Af Amer 55 ([name]) >60 mL/min   GFR calc Af Amer >60 >60 mL/min    Comment: (NOTE) The eGFR has been calculated using the CKD EPI equation. This calculation has not been validated in all clinical situations. eGFR's persistently <60 mL/min signify possible Chronic Kidney Disease.    Anion gap 7 5 - 15  CBC     Status: Abnormal   Collection Time: 01/07/16  5:55 AM  Result Value Ref Range   WBC 9.3 4.0 - 10.5 K/uL   RBC 3.94 3.87 - 5.11 MIL/uL   Hemoglobin 11.8 ([name]) 12.0 - 15.0 g/dL   HCT 38.6 36.0 - 46.0 %   MCV 98.0 78.0 - 100.0 fL   MCH 29.9 26.0 - 34.0 pg    MCHC 30.6 30.0 - 36.0 g/dL   RDW 14.7 11.5 - 15.5 %   Platelets 230 150 - 400 K/uL    ABGS No results for input(s): PHART, PO2ART, TCO2, HCO3 in the last 72 hours.  Invalid input(s): PCO2 CULTURES Recent Results (from the past 240 hour(s))  Culture, blood (Routine X 2) w Reflex to ID Panel     Status: None (Preliminary result)   Collection Time: 01/05/16  5:45 AM  Result Value Ref Range Status   Specimen Description BLOOD LEFT ANTECUBITAL  Final   Special Requests BOTTLES DRAWN AEROBIC AND ANAEROBIC 6CC EACH  Final   Culture NO GROWTH 1 DAY  Final   Report Status PENDING  Incomplete  Culture, blood (Routine X 2) w Reflex to ID Panel     Status: None (Preliminary result)   Collection Time: 01/05/16  5:51 AM  Result Value Ref Range Status   Specimen Description   Final    BLOOD BLOOD RIGHT HAND AEROBIC RH BLOOD LEFT HAND ANAEROBIC LH   Special Requests BOTTLES DRAWN AEROBIC AND ANAEROBIC [facility] EACH  Final   Culture NO GROWTH  1 DAY  Final   Report Status PENDING  Incomplete  Urine culture     Status: None   Collection Time: 01/05/16  6:11 AM  Result Value Ref Range Status   Specimen Description URINE, CATHETERIZED  Final   Special Requests NONE  Final   Culture NO GROWTH Performed at [facility]   Final   Report Status 01/06/2016 FINAL  Final  MRSA PCR Screening     Status: None   Collection Time: 01/05/16  8:57 AM  Result Value Ref Range Status   MRSA by PCR NEGATIVE NEGATIVE Final    Comment:        The GeneXpert MRSA Assay (FDA approved for NASAL specimens only), is one component of a comprehensive MRSA colonization surveillance program. It is not intended to diagnose MRSA infection nor to guide or monitor treatment for MRSA infections.   Culture, blood (routine x 2) Call MD if unable to obtain prior to antibiotics being given     Status: None (Preliminary result)   Collection Time: 01/05/16  9:54 AM  Result Value Ref Range Status   Specimen Description  BLOOD RIGHT ANTECUBITAL  Final   Special Requests BOTTLES DRAWN AEROBIC AND ANAEROBIC 10 cc each  Final   Culture NO GROWTH < 24 HOURS  Final   Report Status PENDING  Incomplete  Culture, blood (routine x 2) Call MD if unable to obtain prior to antibiotics being given     Status: None (Preliminary result)   Collection Time: 01/05/16 10:03 AM  Result Value Ref Range Status   Specimen Description BLOOD BLOOD LEFT HAND  Final   Special Requests BOTTLES DRAWN AEROBIC AND ANAEROBIC 6 cc each  Final   Culture NO GROWTH < 24 HOURS  Final   Report Status PENDING  Incomplete  Culture, sputum-assessment     Status: None   Collection Time: 01/05/16  3:00 PM  Result Value Ref Range Status   Specimen Description EXPECTORATED SPUTUM  Final   Special Requests NONE  Final   Sputum evaluation   Final    THIS SPECIMEN IS ACCEPTABLE. RESPIRATORY CULTURE REPORT TO FOLLOW. Performed at [facility]    Report Status 01/05/2016 FINAL  Final  Culture, respiratory (NON-Expectorated)     Status: None (Preliminary result)   Collection Time: 01/05/16  3:00 PM  Result Value Ref Range Status   Specimen Description EXPECTORATED SPUTUM  Final   Special Requests NONE  Final   Gram Stain   Final    ABUNDANT WBC PRESENT, PREDOMINANTLY PMN ABUNDANT GRAM POSITIVE COCCI IN PAIRS IN CLUSTERS MODERATE GRAM NEGATIVE RODS FEW GRAM NEGATIVE COCCOBACILLI FEW GRAM POSITIVE RODS FEW GRAM VARIABLE ROD FEW SQUAMOUS EPITHELIAL CELLS PRESENT FEW GRAM NEGATIVE DIPLOCOCCI    Culture   Final    CULTURE REINCUBATED FOR BETTER GROWTH Performed at [facility]    Report Status PENDING  Incomplete   Studies/Results: No results found.  Medications:  Prior to Admission:  Prescriptions Prior to Admission  Medication Sig Dispense Refill Last Dose  . ALPRAZolam (XANAX) 0.5 MG tablet Take 0.5 mg by mouth 3 (three) times daily as needed for anxiety.   01/04/2016 at Unknown time  . dexlansoprazole (DEXILANT) 60 MG  capsule Take 60 mg by mouth daily.     01/04/2016 at Unknown time  . diltiazem (CARDIZEM CD) 360 MG 24 hr capsule Take 1 capsule (360 mg total) by mouth daily. 30 capsule 0 01/04/2016 at Unknown time  . ELIQUIS 5  MG TABS tablet TAKE 1 TABLET BY MOUTH TWICE DAILY 60 tablet 5 01/04/2016 at 1700  . Fluticasone Furoate-Vilanterol (BREO ELLIPTA) 100-25 MCG/INH AEPB Inhale 1 puff into the lungs daily as needed (wheezing/shortness of breath).    unknown  . furosemide (LASIX) 40 MG tablet Take 1 tablet (40 mg total) by mouth daily. 30 tablet 1 01/04/2016 at Unknown time  . glipiZIDE (GLUCOTROL) 10 MG tablet Take 1 tablet (10 mg total) by mouth 2 (two) times daily before a meal. Do not take glipizide if blood sugar falls below 125.   01/04/2016 at Unknown time  . HYDROcodone-acetaminophen (NORCO/VICODIN) 5-325 MG tablet Take 1 tablet by mouth 2 (two) times daily as needed for moderate pain.   unknown  . insulin glargine (LANTUS) 100 UNIT/ML injection Inject 0.15 mLs (15 Units total) into the skin 2 (two) times daily. 10 mL 11 01/04/2016 at Unknown time  . Insulin Syringes, Disposable, U-100 1 ML MISC 15 Units by Does not apply route 2 (two) times daily. 200 each 6 01/04/2016 at Unknown time  . levalbuterol (XOPENEX) 0.63 MG/3ML nebulizer solution Take 3 mLs (0.63 mg total) by nebulization every 3 (three) hours as needed for wheezing or shortness of breath. 3 mL 12 01/04/2016 at Unknown time  . levothyroxine (SYNTHROID, LEVOTHROID) 88 MCG tablet Take 88 mcg by mouth daily before breakfast.   01/04/2016 at Unknown time  . meclizine (ANTIVERT) 25 MG tablet Take 12.5 mg by mouth daily as needed for dizziness.   unknown  . metoprolol tartrate (LOPRESSOR) 25 MG tablet Take 25 mg by mouth 2 (two) times daily.   01/04/2016 at 1700  . nitroGLYCERIN (NITROSTAT) 0.4 MG SL tablet Place 1 tablet (0.4 mg total) under the tongue every 5 (five) minutes x 3 doses as needed for chest pain. For severe chest pain 25 tablet 3 unknown  .  OXYGEN Inhale 2-3 [name] into the lungs continuous.   01/04/2016 at Unknown time  . rosuvastatin (CRESTOR) 10 MG tablet Take 1 tablet (10 mg total) by mouth daily. (Patient taking differently: Take 10 mg by mouth at bedtime. ) 30 tablet 6 01/04/2016 at Unknown time  . Vitamin D, Ergocalciferol, (DRISDOL) 50000 units CAPS capsule Take 50,000 Units by mouth every 7 (seven) days. Takes on Mondays.   Past Week at Unknown time  . [DISCONTINUED] levofloxacin (LEVAQUIN) 500 MG tablet Take 1 tablet (500 mg total) by mouth daily. Antibiotic. Starting 12/12/15 take 1 tablet daily until completed. 4 tablet 0   . [DISCONTINUED] predniSONE (DELTASONE) 10 MG tablet Starting 12/12/15, take 6 tablets daily for 1 day; then 5 tablets the next day for 1 day; then 4 tablets the next day for 1 day; then 3 tablets the next day for 1 day; then 2 tablets the next day for 1 day; then 1 tablet the next day for 1 day; then stop. 21 tablet 0    Scheduled: . apixaban  5 mg Oral BID  . ceFEPime (MAXIPIME) IV  1 g Intravenous Q12H  . diltiazem  90 mg Oral Q6H  . furosemide  40 mg Oral Daily  . guaiFENesin  1,200 mg Oral BID  . insulin aspart  0-20 Units Subcutaneous TID WC  . insulin aspart  0-5 Units Subcutaneous QHS  . insulin glargine  15 Units Subcutaneous BID  . ipratropium  0.5 mg Nebulization Q4H  . levalbuterol  0.63 mg Nebulization Q4H  . levothyroxine  88 mcg Oral QAC breakfast  . methylPREDNISolone (SOLU-MEDROL) injection  60 mg  Intravenous Q6H  . metoprolol tartrate  25 mg Oral BID  . pantoprazole  40 mg Oral Daily  . rosuvastatin  10 mg Oral QHS  . sodium chloride flush  3 mL Intravenous Q12H  . vancomycin  750 mg Intravenous Q12H   Continuous: . diltiazem (CARDIZEM) infusion 15 mg/hr (01/07/16 0647)   DBZ:MCEYEM chloride, ALPRAZolam, fluticasone furoate-vilanterol, HYDROcodone-acetaminophen, levalbuterol, meclizine, sodium chloride flush  Assesment: She was admitted with COPD exacerbation, acute on chronic  hypoxic respiratory failure, atrial fibrillation with rapid ventricular response and healthcare associated pneumonia. She does seem to be improving. She is on BiPAP again but did okay with nasal cannula throughout the day yesterday. That will be the plan again today. Active Problems:   Essential hypertension, benign   COPD with acute exacerbation (HCC)   Atrial fibrillation with RVR (HCC)   Chronic anticoagulation-CHADs VASc=6   Acute on chronic respiratory failure with hypoxia ([place])   Uncontrolled type 2 diabetes mellitus with complication (HCC)   HCAP (healthcare-associated pneumonia)    Plan: Since she has had recurrent pneumonia it may be worth having her get immunoglobulin levels which I will order and speech consultation if she's not had that done.    LOS: 2 days   [name] [name] 01/07/2016, 7:47 AM

## 2016-01-07 NOTE — Progress Notes (Signed)
Patient Name: [name] [name] Date of Encounter: 01/07/2016  Primary Cardiologist: Diona BrownerMcDowell[facility]  Hospital Problem List     Active Problems:   Essential hypertension, benign   COPD with acute exacerbation (HCC)   Atrial fibrillation with RVR (HCC)   Chronic anticoagulation-CHADs VASc=6   Acute on chronic respiratory failure with hypoxia (HCC)   Uncontrolled type 2 diabetes mellitus with complication (HCC)   HCAP (healthcare-associated pneumonia)   Subjective   Feeling better, breathing better, no chest pain.   Inpatient Medications    Scheduled Meds: . apixaban  5 mg Oral BID  . ceFEPime (MAXIPIME) IV  1 g Intravenous Q12H  . diltiazem  90 mg Oral Q6H  . furosemide  40 mg Oral Daily  . guaiFENesin  1,200 mg Oral BID  . insulin aspart  0-20 Units Subcutaneous TID WC  . insulin aspart  0-5 Units Subcutaneous QHS  . insulin glargine  15 Units Subcutaneous BID  . ipratropium  0.5 mg Nebulization Q4H  . levalbuterol  0.63 mg Nebulization Q4H  . levothyroxine  88 mcg Oral QAC breakfast  . methylPREDNISolone (SOLU-MEDROL) injection  60 mg Intravenous Q6H  . metoprolol tartrate  25 mg Oral BID  . pantoprazole  40 mg Oral Daily  . rosuvastatin  10 mg Oral QHS  . sodium chloride flush  3 mL Intravenous Q12H  . vancomycin  750 mg Intravenous Q12H   Continuous Infusions: . diltiazem (CARDIZEM) infusion 15 mg/hr (01/07/16 0647)   PRN Meds: sodium chloride, ALPRAZolam, fluticasone furoate-vilanterol, HYDROcodone-acetaminophen, levalbuterol, meclizine, sodium chloride flush   Vital Signs    Vitals:   01/07/16 0400 01/07/16 0452 01/07/16 0500 01/07/16 0700  BP:  134/78  128/78  Pulse:  (!) 110  77  Resp:  (!) 26  (!) 21  Temp: 97.2 F (36.2 C)     TempSrc: Oral     SpO2:  96%  97%  Weight:   164 lb 7.4 oz (74.6 kg)   Height:        Intake/Output Summary (Last 24 hours) at 01/07/16 0750 Last data filed at 01/07/16 0444  Gross per 24 hour  Intake              1080 ml  Output             1200 ml  Net             -120 ml   Filed Weights   01/05/16 0828 01/06/16 0500 01/07/16 0500  Weight: 160 lb 4.4 oz (72.7 kg) 164 lb 7.4 oz (74.6 kg) 164 lb 7.4 oz (74.6 kg)    Physical Exam  GEN: Well nourished, well developed, in no acute distress.  HEENT: Grossly normal.  Neck: Supple, no JVD, carotid bruits, or masses. Cardiac: IRRR, tachycardic,  no murmurs, rubs, or gallops. No clubbing, cyanosis, edema.  Radials/DP/PT 2+ and equal bilaterally.  Respiratory:  Respirations regular and unlabored, on BiPAP, some bibasilar rhonchi,,  GI: Soft, nontender, nondistended, BS + x 4. MS: no deformity or atrophy. Skin: warm and dry, no rash. Neuro:  Strength and sensation are intact. Psych: AAOx3.  Normal affect.  Labs    CBC  Recent Labs  01/05/16 0551 01/06/16 0513 01/07/16 0555  WBC 13.5* 9.5 9.3  NEUTROABS 11.2*  --   --   HGB 12.9 11.5* 11.8*  HCT 42.3 37.5 38.6  MCV 97.9 96.9 98.0  PLT 302 191 230   Basic Metabolic Panel  Recent Labs  01/06/16  [id number] 01/07/16 0555  NA 139 134*  K 3.1* 3.8  CL 96* 94*  CO2 37* 33*  GLUCOSE 108* 334*  BUN 17 30*  CREATININE 0.78 0.98  CALCIUM 8.5* 8.6*   Liver Function Tests  Recent Labs  01/06/16 0513  AST 21  ALT 43  ALKPHOS 93  BILITOT 1.2  PROT 5.6*  ALBUMIN 3.1*     Telemetry    Atrial fib. Some evidence of rapid rates around 7 pm but settled down in the overnight hours to 70;s.   ECG    Personally Reviewed  Radiology    No results found.  Cardiac Studies   Echocardiogram 09/08/2015  - Left ventricle: The cavity size was normal. Wall thickness was   normal. Systolic function was normal. The estimated ejection   fraction was in the range of 60% to 65%. Wall motion was normal;   there were no regional wall motion abnormalities. - Left atrium: The atrium was mildly dilated. - Right ventricle: The cavity size was mildly dilated. Systolic   function was mildly reduced. -  Right atrium: The atrium was mildly to moderately dilated.  Patient Profile    75 y/o female with chronic respiratory failure, chronic diastolic CHF, PAF, we are seeing for AFib RVR.   Assessment & Plan    1. Atrial fib with RVR: Continues on diltiazem gtt at 15 mg hour. Will transition to po today. She is on 360 mg CD tablet at home. Will change to 90 mg Q 6 hours to evaluate her response to this and probably send her home on long acting again. May need to titrate this further if necessary before long acting. Continue apixaban.   2. Chronic Diastolic CHF: Compensated.   3. CAD: Stable without chest pain,.  Signed, Joni ReiningKathryn Lawrence, NP  01/07/2016, 7:50 AM   Patient examined chart reviewed. Off bipap eating breakfast still with rhonchi and wheezing Ok to wean cardizem drip her baseline HR will be 90-100 at reast given severity of her COPD and breathing Rx.    Charlton HawsPeter Zyliah Schier

## 2016-01-07 NOTE — Evaluation (Signed)
Clinical/Bedside Swallow Evaluation Patient Details  Name: Jeronimo NormaShirley O Gatchel MRN: [id number] Date of Birth: 09/18/1940  Today's Date: 01/07/2016 Time: SLP Start Time (ACUTE ONLY): 1333 SLP Stop Time (ACUTE ONLY): 1402 SLP Time Calculation (min) (ACUTE ONLY): 29 min  Past Medical History:  Past Medical History:  Diagnosis Date  . CAD S/P percutaneous coronary angioplasty 2005, 2012   RCA DES,CFX DES-low risk Nuc June 2016  . Cardiomyopathy (HCC)    EF 65-70% Aug 2016 echo  . Carotid artery disease (HCC)   . Chronic a-fib (HCC)    Eliquis started July 2016  . COPD (chronic obstructive pulmonary disease) (HCC)    Home O2  . Essential hypertension, benign   . Hyperlipidemia   . Hypothyroidism   . Sinus bradycardia    HR 30-40s with associated lightheadedness/presyncoe. AVN blockers held with improvement.  . Type 2 diabetes mellitus (HCC)    Past Surgical History:  Past Surgical History:  Procedure Laterality Date  . ABDOMINAL HYSTERECTOMY    . APPENDECTOMY    . CHOLECYSTECTOMY    . Excision of Melanoma  04/29/2010   Lower left neck   HPI:  This is 75 year old female with a history of atrial fibrillation, COPD and chronic respiratory failure on home oxygen who presents to the hospital with 2 weeks of worsening shortness of breath. Chest x-ray indicated pneumonia and she was also found to have COPD exacerbation as well as rapid atrial fibrillation. She was placed on BiPAP and admitted to the stepdown unit for further treatment. SLP asked to evaluate swallow due to recurrent PNA.   Assessment / Plan / Recommendation Clinical Impression  Pt seen at bedside for clinical swallow evaluation. She was on Bi-PAP due to desaturation after lunch. RN removed Bi-PAP and placed nasal cannula for BSE. Pt denies difficulty swallowing, but reports that she occasionally has difficulty with large pills. She reports a similar hospitalization about a year ago with PNA and has had recurrent PNA in the past  few months (per pt). Pt typically wears 2L O2 at home. Oral motor examination is unremarkable except for U/L dentures and mild bruising under her tongue. She is unsure if she bit her tongue. Pt assessed with ice chips, thin water, puree, and graham crackers. She showed no overt signs of symptoms of aspiration, but was observed to breathe through mouth during mastication of cracker. SLP educated pt on safe swallow precautions as related to individuals with COPD/respiratory compromise. Pt encouraged to refrain from speaking during eating/drinking, take breath before placing solids in her mouth, practice inhaling through her nose and exhaling through mouth, implement reflux precautions, take small bites, and decrease rate of intake. Pt also encouraged to continue with excellent oral hygiene (brushing tongue-she already does this). Pt may benefit from soft textures during periods of respiratory compromise to avoid dry, crunchy foods and help with energy conservation. Will downgrade to mechanical soft and continue with thin liquids. Pt appears safe with straws (she does not take sequential swallows). Will follow for diet tolerance and education as needed while in acute setting. She will not need follow up SLP services after discharge, at this time. Pt in agreement with plan of care.     Aspiration Risk  Mild aspiration risk    Diet Recommendation Dysphagia 3 (Mech soft);Thin liquid   Liquid Administration via: Cup;Straw Medication Administration: Whole meds with liquid Supervision: Patient able to self feed Compensations: Slow rate;Small sips/bites Postural Changes: Seated upright at 90 degrees;Remain upright for at least 30 minutes  after po intake    Other  Recommendations Oral Care Recommendations: Oral care BID;Patient independent with oral care Other Recommendations: Clarify dietary restrictions   Follow up Recommendations None      Frequency and Duration min 2x/week  1 week       Prognosis  Prognosis for Safe Diet Advancement: Good      Swallow Study   General Date of Onset: 01/05/16 HPI: This is 75 year old female with a history of atrial fibrillation, COPD and chronic respiratory failure on home oxygen who presents to the hospital with 2 weeks of worsening shortness of breath. Chest x-ray indicated pneumonia and she was also found to have COPD exacerbation as well as rapid atrial fibrillation. She was placed on BiPAP and admitted to the stepdown unit for further treatment. SLP asked to evaluate swallow due to recurrent PNA. Type of Study: Bedside Swallow Evaluation Previous Swallow Assessment: None on record Diet Prior to this Study: Regular;Thin liquids Temperature Spikes Noted: No Respiratory Status: Nasal cannula (Just removed from Bi-PAP) History of Recent Intubation: No Behavior/Cognition: Alert;Cooperative;Pleasant mood Oral Cavity Assessment: Within Functional Limits Oral Care Completed by SLP: Yes Oral Cavity - Dentition: Dentures, top;Dentures, bottom Vision: Functional for self-feeding Self-Feeding Abilities: Able to feed self Patient Positioning: Upright in bed Baseline Vocal Quality: Normal Volitional Cough: Congested;Strong Volitional Swallow: Able to elicit    Oral/Motor/Sensory Function Overall Oral Motor/Sensory Function: Within functional limits   Ice Chips Ice chips: Within functional limits Presentation: Spoon   Thin Liquid Thin Liquid: Within functional limits Presentation: Cup;Self Fed;Straw    Nectar Thick Nectar Thick Liquid: Not tested   Honey Thick Honey Thick Liquid: Not tested   Puree Puree: Within functional limits Presentation: Spoon   Solid   Thank you,  Havery MorosDabney Porter, CCC-SLP [phone]    Solid: Within functional limits Presentation: Self Fed        PORTER,DABNEY 01/07/2016,2:32 PM

## 2016-01-07 NOTE — Progress Notes (Addendum)
Inpatient Diabetes Program Recommendations  AACE/ADA: New Consensus Statement on Inpatient Glycemic Control (2015)  Target Ranges:  Prepandial:   less than 140 mg/dL      Peak postprandial:   less than 180 mg/dL (1-2 hours)      Critically ill patients:  140 - 180 mg/dL   Lab Results  Component Value Date   GLUCAP 295 (H) 01/07/2016   HGBA1C 8.9 (H) 12/09/2015   Review of Glycemic Control  Diabetes history: DM 2 Outpatient Diabetes medications: Glipizide 10 mg BID, Lantus 15 units BID Current orders for Inpatient glycemic control: Lantus 15 units BID, Novolog Resistant + HS  Inpatient Diabetes Program Recommendations:   IV Solumedrol 60 Q 6hours. Glucose trends elevated 200-300 range. Please consider starting Novolog 5 units TID with meal if patient is consuming at least 50% of meals.  Thanks,  Christena DeemShannon Davion Meara RN, MSN, [facility] Inpatient Diabetes Coordinator Team Pager [phone] (8a-5p)

## 2016-01-07 NOTE — Progress Notes (Signed)
PROGRESS NOTE    [name] [name]  ZOX:[id number] DOB: April 15, 1940 DOA: 01/05/2016 PCP: Ignatius Specking, MD    Brief Narrative:  This is 75 year old female with a history of atrial fibrillation, COPD and chronic respiratory failure on home oxygen who presents to the hospital with 2 weeks of worsening shortness of breath. Chest x-ray indicated pneumonia and she was also found to have COPD exacerbation as well as rapid atrial fibrillation. She was placed on BiPAP and admitted to the stepdown unit for further treatment.   Assessment & Plan:   Active Problems:   Essential hypertension, benign   COPD with acute exacerbation (HCC)   Atrial fibrillation with RVR (HCC)   Chronic anticoagulation-CHADs VASc=6   Acute on chronic respiratory failure with hypoxia (HCC)   Uncontrolled type 2 diabetes mellitus with complication (HCC)   HCAP (healthcare-associated pneumonia)  1. Acute on chronic respiratory failure with hypoxia. Likely related to HCAP and COPD exacerbation. She is chronically on 2-3L at home. Pulmonology assisting. Continue supplemental oxygen as needed and try to wean back to home regimen.  2. HCAP. Vancomycin and cefepime. Urinary antigens have been ordered (strep pna negative, Legionella pending). Sputum culture has been ordered. She is coughing up thick green colored sputum  3. COPD exacerbation. Continue intravenous steroids. Continue bronchodilators and mucolytics. She is on antibiotics. She continues to wheeze. Add flutter valve  4. Uncontrolled diabetes. Started the patient on Lantus and resistant sliding-scale. Blood sugars have since improved  5. A. fib with RVR. CHADSVASc 6. She is anticoagulated with Eliquis.  Currently on cardizem 90 mg po q 6hr. Continue metoprolol.  6. Hypertension. Blood pressures are currently stable.   DVT prophylaxis: eliquis Code Status: full Family Communication: discussed with grand daughter at the bedside Disposition Plan: discharge home  once improved   Consultants:   Pulmonology  Procedures:     Antimicrobials:   Vancomycin 11/7>>  cefepime 11/7>>    Subjective: Has wheezing. Reports mild improvement  Objective: Vitals:   01/07/16 1200 01/07/16 1215 01/07/16 1220 01/07/16 1315  BP: (!) 122/109 122/73 122/73   Pulse: (!) 48 (!) 102 (!) 118 97  Resp: (!) 35 (!) 36 (!) 30 (!) 27  Temp:      TempSrc:      SpO2: 91% (!) 86% 95% 96%  Weight:      Height:        Intake/Output Summary (Last 24 hours) at 01/07/16 1417 Last data filed at 01/07/16 1222  Gross per 24 hour  Intake             1210 ml  Output             1300 ml  Net              -90 ml   Filed Weights   01/05/16 0828 01/06/16 0500 01/07/16 0500  Weight: 72.7 kg (160 lb 4.4 oz) 74.6 kg (164 lb 7.4 oz) 74.6 kg (164 lb 7.4 oz)    Examination:  General exam: Appears calm and comfortable, in nad. Respiratory system: bilateral wheezes. Respiratory effort normal. Equal chest rise Cardiovascular system: S1 & S2 heard, irregular. No JVD, murmurs, rubs, gallops or clicks. No pedal edema. Gastrointestinal system: Abdomen is nondistended, soft and nontender. No organomegaly or masses felt. Normal bowel sounds heard. Central nervous system: Alert and oriented. No focal neurological deficits. Extremities: Symmetric 5 x 5 power. Skin: No rashes, lesions or ulcers Psychiatry: Judgement and insight appear normal. Mood & affect appropriate.  Data Reviewed: I have personally reviewed following labs and imaging studies  CBC:  Recent Labs Lab 01/05/16 0551 01/06/16 0513 01/07/16 0555  WBC 13.5* 9.5 9.3  NEUTROABS 11.2*  --   --   HGB 12.9 11.5* 11.8*  HCT 42.3 37.5 38.6  MCV 97.9 96.9 98.0  PLT 302 191 230   Basic Metabolic Panel:  Recent Labs Lab 01/05/16 0551 01/06/16 0513 01/07/16 0555  NA 135 139 134*  K 3.7 3.1* 3.8  CL 94* 96* 94*  CO2 32 37* 33*  GLUCOSE 399* 108* 334*  BUN 29* 17 30*  CREATININE 0.93 0.78 0.98  CALCIUM  9.0 8.5* 8.6*   GFR: Estimated Creatinine Clearance: 47.6 mL/min (by C-G formula based on SCr of 0.98 mg/dL). Liver Function Tests:  Recent Labs Lab 01/06/16 0513  AST 21  ALT 43  ALKPHOS 93  BILITOT 1.2  PROT 5.6*  ALBUMIN 3.1*   No results for input(s): LIPASE, AMYLASE in the last 168 hours. No results for input(s): AMMONIA in the last 168 hours. Coagulation Profile: No results for input(s): INR, PROTIME in the last 168 hours. Cardiac Enzymes: No results for input(s): CKTOTAL, CKMB, CKMBINDEX, TROPONINI in the last 168 hours. BNP (last 3 results) No results for input(s): PROBNP in the last 8760 hours. HbA1C: No results for input(s): HGBA1C in the last 72 hours. CBG:  Recent Labs Lab 01/06/16 1126 01/06/16 1631 01/06/16 2231 01/07/16 0747 01/07/16 1142  GLUCAP 267* 232* 326* 295* 398*   Lipid Profile: No results for input(s): CHOL, HDL, LDLCALC, TRIG, CHOLHDL, LDLDIRECT in the last 72 hours. Thyroid Function Tests: No results for input(s): TSH, T4TOTAL, FREET4, T3FREE, THYROIDAB in the last 72 hours. Anemia Panel: No results for input(s): VITAMINB12, FOLATE, FERRITIN, TIBC, IRON, RETICCTPCT in the last 72 hours. Sepsis Labs:  Recent Labs Lab 01/05/16 0604 01/06/16 1051  LATICACIDVEN 1.98* 2.1*    Recent Results (from the past 240 hour(s))  Culture, blood (Routine X 2) w Reflex to ID Panel     Status: None (Preliminary result)   Collection Time: 01/05/16  5:45 AM  Result Value Ref Range Status   Specimen Description BLOOD LEFT ANTECUBITAL  Final   Special Requests BOTTLES DRAWN AEROBIC AND ANAEROBIC 6CC EACH  Final   Culture NO GROWTH 2 DAYS  Final   Report Status PENDING  Incomplete  Culture, blood (Routine X 2) w Reflex to ID Panel     Status: None (Preliminary result)   Collection Time: 01/05/16  5:51 AM  Result Value Ref Range Status   Specimen Description   Final    BLOOD BLOOD RIGHT HAND AEROBIC RH BLOOD LEFT HAND ANAEROBIC LH   Special Requests  BOTTLES DRAWN AEROBIC AND ANAEROBIC 6CC EACH  Final   Culture NO GROWTH 2 DAYS  Final   Report Status PENDING  Incomplete  Urine culture     Status: None   Collection Time: 01/05/16  6:11 AM  Result Value Ref Range Status   Specimen Description URINE, CATHETERIZED  Final   Special Requests NONE  Final   Culture NO GROWTH [facility]   Final   Report Status 01/06/2016 FINAL  Final  MRSA PCR Screening     Status: None   Collection Time: 01/05/16  8:57 AM  Result Value Ref Range Status   MRSA by PCR NEGATIVE NEGATIVE Final    Comment:        The GeneXpert MRSA Assay (FDA approved for NASAL specimens only), is  one component of a comprehensive MRSA colonization surveillance program. It is not intended to diagnose MRSA infection nor to guide or monitor treatment for MRSA infections.   Culture, blood (routine x 2) Call MD if unable to obtain prior to antibiotics being given     Status: None (Preliminary result)   Collection Time: 01/05/16  9:54 AM  Result Value Ref Range Status   Specimen Description BLOOD RIGHT ANTECUBITAL  Final   Special Requests BOTTLES DRAWN AEROBIC AND ANAEROBIC 10 cc each  Final   Culture NO GROWTH 2 DAYS  Final   Report Status PENDING  Incomplete  Culture, blood (routine x 2) Call MD if unable to obtain prior to antibiotics being given     Status: None (Preliminary result)   Collection Time: 01/05/16 10:03 AM  Result Value Ref Range Status   Specimen Description BLOOD BLOOD LEFT HAND  Final   Special Requests BOTTLES DRAWN AEROBIC AND ANAEROBIC 6 cc each  Final   Culture NO GROWTH 2 DAYS  Final   Report Status PENDING  Incomplete  Culture, sputum-assessment     Status: None   Collection Time: 01/05/16  3:00 PM  Result Value Ref Range Status   Specimen Description EXPECTORATED SPUTUM  Final   Special Requests NONE  Final   Sputum evaluation   Final    THIS SPECIMEN IS ACCEPTABLE. RESPIRATORY CULTURE REPORT TO FOLLOW. Performed at  [facility]    Report Status 01/05/2016 FINAL  Final  Culture, respiratory (NON-Expectorated)     Status: None (Preliminary result)   Collection Time: 01/05/16  3:00 PM  Result Value Ref Range Status   Specimen Description EXPECTORATED SPUTUM  Final   Special Requests NONE  Final   Gram Stain   Final    ABUNDANT WBC PRESENT, PREDOMINANTLY PMN ABUNDANT GRAM POSITIVE COCCI IN PAIRS IN CLUSTERS MODERATE GRAM NEGATIVE RODS FEW GRAM NEGATIVE COCCOBACILLI FEW GRAM POSITIVE RODS FEW GRAM VARIABLE ROD FEW SQUAMOUS EPITHELIAL CELLS PRESENT FEW GRAM NEGATIVE DIPLOCOCCI    Culture   Final    CULTURE REINCUBATED FOR BETTER GROWTH Performed at [facility]    Report Status PENDING  Incomplete         Radiology Studies: No results found.      Scheduled Meds: . apixaban  5 mg Oral BID  . ceFEPime (MAXIPIME) IV  1 g Intravenous Q12H  . diltiazem  90 mg Oral Q6H  . furosemide  40 mg Oral Daily  . guaiFENesin  1,200 mg Oral BID  . insulin aspart  0-20 Units Subcutaneous TID WC  . insulin aspart  0-5 Units Subcutaneous QHS  . insulin glargine  15 Units Subcutaneous BID  . ipratropium  0.5 mg Nebulization Q4H  . levalbuterol  0.63 mg Nebulization Q4H  . levothyroxine  88 mcg Oral QAC breakfast  . methylPREDNISolone (SOLU-MEDROL) injection  60 mg Intravenous Q6H  . metoprolol tartrate  25 mg Oral BID  . pantoprazole  40 mg Oral Daily  . rosuvastatin  10 mg Oral QHS  . sodium chloride flush  3 mL Intravenous Q12H  . vancomycin  750 mg Intravenous Q12H   Continuous Infusions:   LOS: 2 days   Time spent: > 35 mins  Penny Pia, MD [facility] Pager [phone]  If 7PM-7AM, please contact night-coverage [email or web address] Password [email or web address] 01/07/2016, 2:17 PM

## 2016-01-07 NOTE — Progress Notes (Signed)
Patient has not been successful on just 6L nasal cannula today. She has had to go back on Bipap multiple times due to O2 sat in the low 80's. Dyspnea with exertion.  HR continues to range from 95-145. Notified MD   Patient is exhaustted with the slightest activities such as getting [facility].   Will continue to monitor patient   Genelle Balameron D Keyante Durio, RN

## 2016-01-08 ENCOUNTER — Inpatient Hospital Stay (HOSPITAL_COMMUNITY): Payer: Medicare Other

## 2016-01-08 LAB — GLUCOSE, CAPILLARY
GLUCOSE-CAPILLARY: 422 mg/dL — AB (ref 65–99)
GLUCOSE-CAPILLARY: 282 mg/dL — AB (ref 65–99)
Glucose-Capillary: 119 mg/dL — ABNORMAL HIGH (ref 65–99)
Glucose-Capillary: 329 mg/dL — ABNORMAL HIGH (ref 65–99)

## 2016-01-08 LAB — CULTURE, RESPIRATORY

## 2016-01-08 LAB — IMMUNOGLOBULINS A/E/G/M, SERUM
IGA: 311 mg/dL (ref 64–422)
IGE (IMMUNOGLOBULIN E), SERUM: 343 [IU]/mL — AB (ref 0–100)
IGG (IMMUNOGLOBIN G), SERUM: 528 mg/dL — AB (ref 700–1600)
IGM, SERUM: 34 mg/dL (ref 26–217)

## 2016-01-08 LAB — BASIC METABOLIC PANEL
ANION GAP: 10 (ref 5–15)
BUN: 31 mg/dL — ABNORMAL HIGH (ref 6–20)
CALCIUM: 8.6 mg/dL — AB (ref 8.9–10.3)
CO2: 37 mmol/L — ABNORMAL HIGH (ref 22–32)
Chloride: 88 mmol/L — ABNORMAL LOW (ref 101–111)
Creatinine, Ser: 1.01 mg/dL — ABNORMAL HIGH (ref 0.44–1.00)
GFR, EST NON AFRICAN AMERICAN: 53 mL/min — AB (ref >60)
GLUCOSE: 425 mg/dL — AB (ref 65–99)
POTASSIUM: 3 mmol/L — AB (ref 3.5–5.1)
SODIUM: 135 mmol/L (ref 135–145)

## 2016-01-08 LAB — CULTURE, RESPIRATORY W GRAM STAIN: Culture: NORMAL

## 2016-01-08 LAB — VANCOMYCIN, TROUGH: Vancomycin Tr: 18 ug/mL (ref 15–20)

## 2016-01-08 MED ORDER — ARFORMOTEROL TARTRATE 15 MCG/2ML IN NEBU
15.0000 ug | INHALATION_SOLUTION | Freq: Two times a day (BID) | RESPIRATORY_TRACT | Status: DC
  Administered 2016-01-08 – 2016-01-15 (×15): 15 ug via RESPIRATORY_TRACT
  Filled 2016-01-08 (×15): qty 2

## 2016-01-08 MED ORDER — POTASSIUM CHLORIDE CRYS ER 20 MEQ PO TBCR
40.0000 meq | EXTENDED_RELEASE_TABLET | Freq: Once | ORAL | Status: AC
  Administered 2016-01-08: 40 meq via ORAL
  Filled 2016-01-08: qty 2

## 2016-01-08 MED ORDER — METOPROLOL TARTRATE 25 MG PO TABS
25.0000 mg | ORAL_TABLET | Freq: Three times a day (TID) | ORAL | Status: DC
  Administered 2016-01-08 – 2016-01-11 (×9): 25 mg via ORAL
  Filled 2016-01-08 (×9): qty 1

## 2016-01-08 MED ORDER — DILTIAZEM HCL 60 MG PO TABS
60.0000 mg | ORAL_TABLET | Freq: Once | ORAL | Status: AC
  Administered 2016-01-08: 60 mg via ORAL
  Filled 2016-01-08: qty 1

## 2016-01-08 MED ORDER — FUROSEMIDE 10 MG/ML IJ SOLN
40.0000 mg | Freq: Every day | INTRAMUSCULAR | Status: DC
  Administered 2016-01-08 – 2016-01-13 (×6): 40 mg via INTRAVENOUS
  Filled 2016-01-08 (×6): qty 4

## 2016-01-08 MED ORDER — BUDESONIDE 0.5 MG/2ML IN SUSP
0.5000 mg | Freq: Two times a day (BID) | RESPIRATORY_TRACT | Status: DC
  Administered 2016-01-08 – 2016-01-15 (×15): 0.5 mg via RESPIRATORY_TRACT
  Filled 2016-01-08 (×15): qty 2

## 2016-01-08 NOTE — Progress Notes (Signed)
   Subjective: Still with some SOB   Objective: Vitals:   01/08/16 0816 01/08/16 0832 01/08/16 0931 01/08/16 1125  BP:      Pulse:   (!) 123   Resp:      Temp:      TempSrc:      SpO2: 97% 94%  98%  Weight:      Height:       Weight change: -2 lb 10.3 oz (-1.2 kg)  Intake/Output Summary (Last 24 hours) at 01/08/16 1147 Last data filed at 01/08/16 0939  Gross per 24 hour  Intake              480 ml  Output             2575 ml  Net            -2095 ml    General: Alert, awake, oriented x3, in no acute distress Neck:  JVP is increased   Heart: Irregular rate and rhythm, without murmurs, rubs, gallops.  Lungs: Mild rhonchi  No rales or wheezes. Exemities:  Tr  edema.   Neuro: Grossly intact, nonfocal.  Te;e  Atrial fib  90s to 110s   Lab Results: Results for orders placed or performed during the hospital encounter of 01/05/16 (from the past 24 hour(s))  Glucose, capillary     Status: Abnormal   Collection Time: 01/07/16  4:26 PM  Result Value Ref Range   Glucose-Capillary 270 (H) 65 - 99 mg/dL   Comment 1 Notify RN    Comment 2 Document in Chart   Glucose, capillary     Status: Abnormal   Collection Time: 01/07/16  9:34 PM  Result Value Ref Range   Glucose-Capillary 179 (H) 65 - 99 mg/dL  Vancomycin, trough     Status: None   Collection Time: 01/08/16  5:15 AM  Result Value Ref Range   Vancomycin Tr 18 15 - 20 ug/mL  Glucose, capillary     Status: Abnormal   Collection Time: 01/08/16  7:54 AM  Result Value Ref Range   Glucose-Capillary 119 (H) 65 - 99 mg/dL   Comment 1 Notify RN    Comment 2 Document in Chart     Studies/Results: Dg Chest Port 1 View  Result Date: 01/08/2016 CLINICAL DATA:  Respiratory failure. History of COPD, former smoker, coronary artery disease, and atrial fibrillation. EXAM: PORTABLE CHEST 1 VIEW COMPARISON:  Portable chest x-ray of January 05, 2016 FINDINGS: There remains a small right pleural effusion. Infiltrate at the right lung base  persists. The interstitial markings of both lungs are increased. There is no left pleural effusion. The heart is mildly enlarged. The central pulmonary vascularity is prominent. There is calcification within the wall of the aortic arch. The observed bony thorax is unremarkable. IMPRESSION: Right lower lobe atelectasis or pneumonia with small right pleural effusion. CHF with mild pulmonary interstitial edema which has become more conspicuous since the earlier study. Aortic atherosclerosis. Electronically Signed   By: David  SwazilandJordan M.D.   On: 01/08/2016 09:21    Medications  Reviewed   @PROBHOSP @  1  Atrial fib  Rates are still increased  I would continue po dilt  COntinue low dse b blocker  I would increase to tid Follow  COntinue Eliquis  PT with increased volume on exam I would switch lasix to IV  Follow.  LOS: 3 days   [name] 01/08/2016, 11:47 AM

## 2016-01-08 NOTE — Progress Notes (Signed)
Pharmacy Antibiotic Note  [name] [name] is a 75 y.o. female admitted on 01/05/2016 with pneumonia.  Pharmacy has been consulted for Vancomycin and Cefepime dosing. Vanc trough this am is therapeutic.  MRSA pcr is negative.  MRSA PNA unlikely, consider dc vanc  Plan: Cont vancomycin 750 mg IV q12 hours  Cont cefepime 1gm IV q12h F/U cultures and clinical progress  Height: 5\' 2"  (157.5 cm) Weight: 161 lb 13.1 oz (73.4 kg) IBW/kg (Calculated) : 50.1  Temp (24hrs), Avg:97 F (36.1 C), Min:96.7 F (35.9 C), Max:97.6 F (36.4 C)   Recent Labs Lab 01/05/16 0551 01/05/16 0604 01/06/16 0513 01/06/16 1051 01/07/16 0555 01/08/16 0515  WBC 13.5*  --  9.5  --  9.3  --   CREATININE 0.93  --  0.78  --  0.98  --   LATICACIDVEN  --  1.98*  --  2.1*  --   --   VANCOTROUGH  --   --   --   --   --  18    Estimated Creatinine Clearance: 47.2 mL/min (by C-G formula based on SCr of 0.98 mg/dL).    Allergies  Allergen Reactions  . Adalat [Nifedipine] Nausea And Vomiting  . Claritin-D 12 Hour [Loratadine-Pseudoephedrine Er] Nausea And Vomiting  . Codeine     REACTION: stomach upset  . Plavix [Clopidogrel Bisulfate] Other (See Comments)    Legs hurt  . Wellbutrin [Bupropion] Other (See Comments)    nightmares  . Ciprofloxacin Nausea And Vomiting  . Metformin And Related Other (See Comments)    Fatigue      Antimicrobials this admission: Vancomycin 11/7 >>  Cefepime 11/7 >>   Dose adjustments this admission: N/A  Microbiology results: 11/7 BCx: ngtd 11/7 UCx: ng final 11/7 MRSA PCR: (-) 11/7  Sputum:  Normal flora  Thank you for allowing pharmacy to be a part of this patient's care.  Talbert CageSeay, Makensie Mulhall Poteet 01/08/2016 12:03 PM

## 2016-01-08 NOTE — Care Management Important Message (Signed)
Important Message  Patient Details  Name: [name] [name] MRN: [id number] Date of Birth: 10/31/1940   Medicare Important Message Given:  Yes    Malcolm Metrohildress, Sharlena Kristensen Demske, RN 01/08/2016, 9:06 AM

## 2016-01-08 NOTE — Progress Notes (Signed)
Subjective: She says she doesn't feel quite as well today. She is on multiple bronchodilators and I was concerned about heart rate but her heart rate looks pretty well controlled at this point. She says she's coughing and bringing up sputum. No chest pain. No nausea or vomiting.  Objective: Vital signs in last 24 hours: Temp:  [96.7 F (35.9 C)-97.6 F (36.4 C)] 97.1 F (36.2 C) (11/10 0400) Pulse Rate:  [40-124] 50 (11/10 0400) Resp:  [18-36] 23 (11/10 0400) BP: (97-151)/(52-120) 115/58 (11/10 0400) SpO2:  [86 %-98 %] 98 % (11/10 0400) FiO2 (%):  [40 %] 40 % (11/10 0349) Weight:  [73.4 kg (161 lb 13.1 oz)] 73.4 kg (161 lb 13.1 oz) (11/10 0500) Weight change: -1.2 kg (-2 lb 10.3 oz) Last BM Date: 01/07/16 (per pt,per staff)  Intake/Output from previous day: 11/09 0701 - 11/10 0700 In: 490 [P.O.:480; I.V.:10] Out: 2025 [Urine:2025]  PHYSICAL EXAM General appearance: alert, cooperative and mild distress Resp: rhonchi bilaterally Cardio: irregularly irregular rhythm GI: soft, non-tender; bowel sounds normal; no masses,  no organomegaly Extremities: extremities normal, atraumatic, no cyanosis or edema skin warm and dry  Lab Results:  Results for orders placed or performed during the hospital encounter of 01/05/16 (from the past 48 hour(s))  Glucose, capillary     Status: Abnormal   Collection Time: 01/06/16  7:45 AM  Result Value Ref Range   Glucose-Capillary 147 (H) 65 - 99 mg/dL   Comment 1 Notify RN    Comment 2 Document in Chart   Lactic acid, plasma     Status: Abnormal   Collection Time: 01/06/16 10:51 AM  Result Value Ref Range   Lactic Acid, Venous 2.1 (HH) 0.5 - 1.9 mmol/L    Comment: CRITICAL RESULT CALLED TO, READ BACK BY AND VERIFIED WITH: MCGIBBINEY,C AT 11:45AM ON 01/06/16 BY FESTERMAN,C   Glucose, capillary     Status: Abnormal   Collection Time: 01/06/16 11:26 AM  Result Value Ref Range   Glucose-Capillary 267 (H) 65 - 99 mg/dL   Comment 1 Notify RN    Comment 2 Document in Chart   Glucose, capillary     Status: Abnormal   Collection Time: 01/06/16  4:31 PM  Result Value Ref Range   Glucose-Capillary 232 (H) 65 - 99 mg/dL   Comment 1 Notify RN    Comment 2 Document in Chart   Glucose, capillary     Status: Abnormal   Collection Time: 01/06/16 10:31 PM  Result Value Ref Range   Glucose-Capillary 326 (H) 65 - 99 mg/dL  Basic metabolic panel     Status: Abnormal   Collection Time: 01/07/16  5:55 AM  Result Value Ref Range   Sodium 134 (L) 135 - 145 mmol/L   Potassium 3.8 3.5 - 5.1 mmol/L    Comment: DELTA CHECK NOTED   Chloride 94 (L) 101 - 111 mmol/L   CO2 33 (H) 22 - 32 mmol/L   Glucose, Bld 334 (H) 65 - 99 mg/dL   BUN 30 (H) 6 - 20 mg/dL   Creatinine, Ser 0.98 0.44 - 1.00 mg/dL   Calcium 8.6 (L) 8.9 - 10.3 mg/dL   GFR calc non Af Amer 55 (L) >60 mL/min   GFR calc Af Amer >60 >60 mL/min    Comment: (NOTE) The eGFR has been calculated using the CKD EPI equation. This calculation has not been validated in all clinical situations. eGFR's persistently <60 mL/min signify possible Chronic Kidney Disease.    Anion gap 7  5 - 15  CBC     Status: Abnormal   Collection Time: 01/07/16  5:55 AM  Result Value Ref Range   WBC 9.3 4.0 - 10.5 K/uL   RBC 3.94 3.87 - 5.11 MIL/uL   Hemoglobin 11.8 (L) 12.0 - 15.0 g/dL   HCT 38.6 36.0 - 46.0 %   MCV 98.0 78.0 - 100.0 fL   MCH 29.9 26.0 - 34.0 pg   MCHC 30.6 30.0 - 36.0 g/dL   RDW 14.7 11.5 - 15.5 %   Platelets 230 150 - 400 K/uL  Glucose, capillary     Status: Abnormal   Collection Time: 01/07/16  7:47 AM  Result Value Ref Range   Glucose-Capillary 295 (H) 65 - 99 mg/dL   Comment 1 Notify RN    Comment 2 Document in Chart   Immunoglobulins, QN, A/E/G/M     Status: Abnormal   Collection Time: 01/07/16  9:52 AM  Result Value Ref Range   IgG (Immunoglobin G), Serum 528 (L) 700 - 1,600 mg/dL   IgA 311 64 - 422 mg/dL   IgM, Serum 34 26 - 217 mg/dL   IgE (Immunoglobulin E), Serum 343  (H) 0 - 100 IU/mL    Comment: (NOTE) Performed At: [facility] [address] [place], [place] [id number] Lindon Romp MD BP:[id number]   Glucose, capillary     Status: Abnormal   Collection Time: 01/07/16 11:42 AM  Result Value Ref Range   Glucose-Capillary 398 (H) 65 - 99 mg/dL   Comment 1 Notify RN    Comment 2 Document in Chart   Glucose, capillary     Status: Abnormal   Collection Time: 01/07/16  4:26 PM  Result Value Ref Range   Glucose-Capillary 270 (H) 65 - 99 mg/dL   Comment 1 Notify RN    Comment 2 Document in Chart   Glucose, capillary     Status: Abnormal   Collection Time: 01/07/16  9:34 PM  Result Value Ref Range   Glucose-Capillary 179 (H) 65 - 99 mg/dL  Vancomycin, trough     Status: None   Collection Time: 01/08/16  5:15 AM  Result Value Ref Range   Vancomycin Tr 18 15 - 20 ug/mL    ABGS No results for input(s): PHART, PO2ART, TCO2, HCO3 in the last 72 hours.  Invalid input(s): PCO2 CULTURES Recent Results (from the past 240 hour(s))  Culture, blood (Routine X 2) w Reflex to ID Panel     Status: None (Preliminary result)   Collection Time: 01/05/16  5:45 AM  Result Value Ref Range Status   Specimen Description BLOOD LEFT ANTECUBITAL  Final   Special Requests BOTTLES DRAWN AEROBIC AND ANAEROBIC 6CC EACH  Final   Culture NO GROWTH 2 DAYS  Final   Report Status PENDING  Incomplete  Culture, blood (Routine X 2) w Reflex to ID Panel     Status: None (Preliminary result)   Collection Time: 01/05/16  5:51 AM  Result Value Ref Range Status   Specimen Description   Final    BLOOD BLOOD RIGHT HAND AEROBIC RH BLOOD LEFT HAND ANAEROBIC LH   Special Requests BOTTLES DRAWN AEROBIC AND ANAEROBIC 6CC EACH  Final   Culture NO GROWTH 2 DAYS  Final   Report Status PENDING  Incomplete  Urine culture     Status: None   Collection Time: 01/05/16  6:11 AM  Result Value Ref Range Status   Specimen Description URINE, CATHETERIZED  Final   Special  Requests  NONE  Final   Culture NO GROWTH Performed at [facility]   Final   Report Status 01/06/2016 FINAL  Final  MRSA PCR Screening     Status: None   Collection Time: 01/05/16  8:57 AM  Result Value Ref Range Status   MRSA by PCR NEGATIVE NEGATIVE Final    Comment:        The GeneXpert MRSA Assay (FDA approved for NASAL specimens only), is one component of a comprehensive MRSA colonization surveillance program. It is not intended to diagnose MRSA infection nor to guide or monitor treatment for MRSA infections.   Culture, blood (routine x 2) Call MD if unable to obtain prior to antibiotics being given     Status: None (Preliminary result)   Collection Time: 01/05/16  9:54 AM  Result Value Ref Range Status   Specimen Description BLOOD RIGHT ANTECUBITAL  Final   Special Requests BOTTLES DRAWN AEROBIC AND ANAEROBIC 10 cc each  Final   Culture NO GROWTH 2 DAYS  Final   Report Status PENDING  Incomplete  Culture, blood (routine x 2) Call MD if unable to obtain prior to antibiotics being given     Status: None (Preliminary result)   Collection Time: 01/05/16 10:03 AM  Result Value Ref Range Status   Specimen Description BLOOD BLOOD LEFT HAND  Final   Special Requests BOTTLES DRAWN AEROBIC AND ANAEROBIC 6 cc each  Final   Culture NO GROWTH 2 DAYS  Final   Report Status PENDING  Incomplete  Culture, sputum-assessment     Status: None   Collection Time: 01/05/16  3:00 PM  Result Value Ref Range Status   Specimen Description EXPECTORATED SPUTUM  Final   Special Requests NONE  Final   Sputum evaluation   Final    THIS SPECIMEN IS ACCEPTABLE. RESPIRATORY CULTURE REPORT TO FOLLOW. Performed at [facility]    Report Status 01/05/2016 FINAL  Final  Culture, respiratory (NON-Expectorated)     Status: None (Preliminary result)   Collection Time: 01/05/16  3:00 PM  Result Value Ref Range Status   Specimen Description EXPECTORATED SPUTUM  Final   Special Requests NONE  Final    Gram Stain   Final    ABUNDANT WBC PRESENT, PREDOMINANTLY PMN ABUNDANT GRAM POSITIVE COCCI IN PAIRS IN CLUSTERS MODERATE GRAM NEGATIVE RODS FEW GRAM NEGATIVE COCCOBACILLI FEW GRAM POSITIVE RODS FEW GRAM VARIABLE ROD FEW SQUAMOUS EPITHELIAL CELLS PRESENT FEW GRAM NEGATIVE DIPLOCOCCI    Culture   Final    CULTURE REINCUBATED FOR BETTER GROWTH Performed at [facility]    Report Status PENDING  Incomplete   Studies/Results: No results found.  Medications:  Prior to Admission:  Prescriptions Prior to Admission  Medication Sig Dispense Refill Last Dose  . ALPRAZolam (XANAX) 0.5 MG tablet Take 0.5 mg by mouth 3 (three) times daily as needed for anxiety.   01/04/2016 at Unknown time  . dexlansoprazole (DEXILANT) 60 MG capsule Take 60 mg by mouth daily.     01/04/2016 at Unknown time  . diltiazem (CARDIZEM CD) 360 MG 24 hr capsule Take 1 capsule (360 mg total) by mouth daily. 30 capsule 0 01/04/2016 at Unknown time  . ELIQUIS 5 MG TABS tablet TAKE 1 TABLET BY MOUTH TWICE DAILY 60 tablet 5 01/04/2016 at 1700  . Fluticasone Furoate-Vilanterol (BREO ELLIPTA) 100-25 MCG/INH AEPB Inhale 1 puff into the lungs daily as needed (wheezing/shortness of breath).    unknown  . furosemide (LASIX) 40 MG  tablet Take 1 tablet (40 mg total) by mouth daily. 30 tablet 1 01/04/2016 at Unknown time  . glipiZIDE (GLUCOTROL) 10 MG tablet Take 1 tablet (10 mg total) by mouth 2 (two) times daily before a meal. Do not take glipizide if blood sugar falls below 125.   01/04/2016 at Unknown time  . HYDROcodone-acetaminophen (NORCO/VICODIN) 5-325 MG tablet Take 1 tablet by mouth 2 (two) times daily as needed for moderate pain.   unknown  . insulin glargine (LANTUS) 100 UNIT/ML injection Inject 0.15 mLs (15 Units total) into the skin 2 (two) times daily. 10 mL 11 01/04/2016 at Unknown time  . Insulin Syringes, Disposable, U-100 1 ML MISC 15 Units by Does not apply route 2 (two) times daily. 200 each 6 01/04/2016 at Unknown  time  . levalbuterol (XOPENEX) 0.63 MG/3ML nebulizer solution Take 3 mLs (0.63 mg total) by nebulization every 3 (three) hours as needed for wheezing or shortness of breath. 3 mL 12 01/04/2016 at Unknown time  . levothyroxine (SYNTHROID, LEVOTHROID) 88 MCG tablet Take 88 mcg by mouth daily before breakfast.   01/04/2016 at Unknown time  . meclizine (ANTIVERT) 25 MG tablet Take 12.5 mg by mouth daily as needed for dizziness.   unknown  . metoprolol tartrate (LOPRESSOR) 25 MG tablet Take 25 mg by mouth 2 (two) times daily.   01/04/2016 at 1700  . nitroGLYCERIN (NITROSTAT) 0.4 MG SL tablet Place 1 tablet (0.4 mg total) under the tongue every 5 (five) minutes x 3 doses as needed for chest pain. For severe chest pain 25 tablet 3 unknown  . OXYGEN Inhale 2-3 L into the lungs continuous.   01/04/2016 at Unknown time  . rosuvastatin (CRESTOR) 10 MG tablet Take 1 tablet (10 mg total) by mouth daily. (Patient taking differently: Take 10 mg by mouth at bedtime. ) 30 tablet 6 01/04/2016 at Unknown time  . Vitamin D, Ergocalciferol, (DRISDOL) 50000 units CAPS capsule Take 50,000 Units by mouth every 7 (seven) days. Takes on Mondays.   Past Week at Unknown time  . [DISCONTINUED] levofloxacin (LEVAQUIN) 500 MG tablet Take 1 tablet (500 mg total) by mouth daily. Antibiotic. Starting 12/12/15 take 1 tablet daily until completed. 4 tablet 0   . [DISCONTINUED] predniSONE (DELTASONE) 10 MG tablet Starting 12/12/15, take 6 tablets daily for 1 day; then 5 tablets the next day for 1 day; then 4 tablets the next day for 1 day; then 3 tablets the next day for 1 day; then 2 tablets the next day for 1 day; then 1 tablet the next day for 1 day; then stop. 21 tablet 0    Scheduled: . apixaban  5 mg Oral BID  . ceFEPime (MAXIPIME) IV  1 g Intravenous Q12H  . diltiazem  90 mg Oral Q6H  . furosemide  40 mg Oral Daily  . guaiFENesin  1,200 mg Oral BID  . insulin aspart  0-20 Units Subcutaneous TID WC  . insulin aspart  0-5 Units  Subcutaneous QHS  . insulin glargine  15 Units Subcutaneous BID  . ipratropium  0.5 mg Nebulization Q4H  . levalbuterol  0.63 mg Nebulization Q4H  . levothyroxine  88 mcg Oral QAC breakfast  . methylPREDNISolone (SOLU-MEDROL) injection  60 mg Intravenous Q6H  . metoprolol tartrate  25 mg Oral BID  . pantoprazole  40 mg Oral Daily  . rosuvastatin  10 mg Oral QHS  . sodium chloride flush  3 mL Intravenous Q12H  . vancomycin  750 mg Intravenous Q12H  Continuous:  UPJ:SRPRXY chloride, ALPRAZolam, fluticasone furoate-vilanterol, HYDROcodone-acetaminophen, levalbuterol, meclizine, sodium chloride flush  Assesment:She was admitted with COPD with acute exacerbation, acute on chronic hypoxic respiratory failure, healthcare associated pneumonia. She is improving but slowly. She was not able to stay off BiPAP yesterday. She was felt to be mild aspiration risk when evaluated by speech Active Problems:   Essential hypertension, benign   COPD with acute exacerbation (HCC)   Atrial fibrillation with RVR (HCC)   Chronic anticoagulation-CHADs VASc=6   Acute on chronic respiratory failure with hypoxia ([place])   Uncontrolled type 2 diabetes mellitus with complication ([place])   HCAP (healthcare-associated pneumonia)    Plan: Continue treatments. Aspiration precautions.    LOS: 3 days   Tauni Sanks L 01/08/2016, 7:27 AM

## 2016-01-08 NOTE — Progress Notes (Signed)
Inpatient Diabetes Program Recommendations  AACE/ADA: New Consensus Statement on Inpatient Glycemic Control (2015)  Target Ranges:  Prepandial:   less than 140 mg/dL      Peak postprandial:   less than 180 mg/dL (1-2 hours)      Critically ill patients:  140 - 180 mg/dL   Review of Glycemic Control Results for [name] [name], [name] (MRN [id number]) as of 01/08/2016 10:52  Ref. Range 01/07/2016 07:47 01/07/2016 11:42 01/07/2016 16:26 01/07/2016 21:34 01/08/2016 07:54  Glucose-Capillary Latest Ref Range: 65 - 99 mg/dL 308295 (H) 657398 (H) 846270 (H) 179 (H) 119 (H)     Diabetes history: DM 2 Outpatient Diabetes medications: Glipizide 10 mg BID, Lantus 15 units BID Current orders for Inpatient glycemic control: Lantus 15 units BID, Novolog Resistant + HS  Inpatient Diabetes Program Recommendations:   IV Solumedrol 60 Q 6hours. Glucose trends elevated 200-300 range. Please consider starting Novolog 5 units TID with meal if patient is consuming at least 50% of meals.  Thank you,  Kristine LineaKaren Wisdom Seybold, RN, BSN Diabetes Coordinator Inpatient Diabetes Program [phone] (Team Pager)

## 2016-01-08 NOTE — Progress Notes (Signed)
PROGRESS NOTE    [name] [name]  XBJ:[id number] DOB: 12/01/1940 DOA: 01/05/2016 PCP: Ignatius Speckinghruv B Vyas, MD    Brief Narrative:  This is 75 year old female with a history of atrial fibrillation, COPD and chronic respiratory failure on home oxygen who presents to the hospital with 2 weeks of worsening shortness of breath. Chest x-ray indicated pneumonia and she was also found to have COPD exacerbation as well as rapid atrial fibrillation. She was placed on BiPAP and admitted to the stepdown unit for further treatment.   Assessment & Plan:   Active Problems:   Essential hypertension, benign   COPD with acute exacerbation (HCC)   Atrial fibrillation with RVR (HCC)   Chronic anticoagulation-CHADs VASc=6   Acute on chronic respiratory failure with hypoxia (HCC)   Uncontrolled type 2 diabetes mellitus with complication (HCC)   HCAP (healthcare-associated pneumonia)  1. Acute on chronic respiratory failure with hypoxia. Likely related to HCAP and COPD exacerbation. She is chronically on 2-3L at home. Pulmonology on Board. Continue supplemental oxygen as needed and try to wean  2. HCAP. Vancomycin and cefepime. Urinary antigens have been ordered (strep pna negative, Legionella negative). Sputum culture has been ordered.  3. COPD exacerbation. Continue intravenous steroids. Continue bronchodilators and mucolytics. She is on antibiotics. She continues to wheeze. Add flutter valve  4. Uncontrolled diabetes. Started the patient on Lantus and resistant sliding-scale. Blood sugars have since improved  5. A. fib with RVR. CHADSVASc 6. She is anticoagulated with Eliquis.  Currently on cardizem 90 mg po q 6hr. Continue metoprolol. Cardiology assisting  6. Hypertension. Blood pressures are currently stable.   DVT prophylaxis: eliquis Code Status: full Family Communication: discussed with grand daughter at the bedside Disposition Plan: discharge home once improved   Consultants:    Pulmonology  Procedures:     Antimicrobials:   Vancomycin 11/7>>  cefepime 11/7>>    Subjective: Reports improvement in condition  Objective: Vitals:   01/08/16 0832 01/08/16 0931 01/08/16 1125 01/08/16 1230  BP:      Pulse:  (!) 123    Resp:      Temp:    97.7 F (36.5 C)  TempSrc:    Oral  SpO2: 94%  98%   Weight:      Height:        Intake/Output Summary (Last 24 hours) at 01/08/16 1312 Last data filed at 01/08/16 1000  Gross per 24 hour  Intake              245 ml  Output             2175 ml  Net            -1930 ml   Filed Weights   01/06/16 0500 01/07/16 0500 01/08/16 0500  Weight: 74.6 kg (164 lb 7.4 oz) 74.6 kg (164 lb 7.4 oz) 73.4 kg (161 lb 13.1 oz)    Examination:  General exam: Appears calm and comfortable, in nad. Respiratory system: expiratory wheeze mild. Respiratory effort normal. Equal chest rise Cardiovascular system: S1 & S2 heard, irregular. No JVD, murmurs, rubs, gallops or clicks. No pedal edema. Gastrointestinal system: Abdomen is nondistended, soft and nontender. No organomegaly or masses felt. Normal bowel sounds heard. Central nervous system: Alert and oriented. No focal neurological deficits. Extremities: Symmetric 5 x 5 power. Skin: No rashes, lesions or ulcers Psychiatry: Judgement and insight appear normal. Mood & affect appropriate.   Data Reviewed: I have personally reviewed following labs and imaging studies  CBC:  Recent Labs Lab 01/05/16 0551 01/06/16 0513 01/07/16 0555  WBC 13.5* 9.5 9.3  NEUTROABS 11.2*  --   --   HGB 12.9 11.5* 11.8*  HCT 42.3 37.5 38.6  MCV 97.9 96.9 98.0  PLT 302 191 230   Basic Metabolic Panel:  Recent Labs Lab 01/05/16 0551 01/06/16 0513 01/07/16 0555  NA 135 139 134*  K 3.7 3.1* 3.8  CL 94* 96* 94*  CO2 32 37* 33*  GLUCOSE 399* 108* 334*  BUN 29* 17 30*  CREATININE 0.93 0.78 0.98  CALCIUM 9.0 8.5* 8.6*   GFR: Estimated Creatinine Clearance: 47.2 mL/min (by C-G formula  based on SCr of 0.98 mg/dL). Liver Function Tests:  Recent Labs Lab 01/06/16 0513  AST 21  ALT 43  ALKPHOS 93  BILITOT 1.2  PROT 5.6*  ALBUMIN 3.1*   No results for input(s): LIPASE, AMYLASE in the last 168 hours. No results for input(s): AMMONIA in the last 168 hours. Coagulation Profile: No results for input(s): INR, PROTIME in the last 168 hours. Cardiac Enzymes: No results for input(s): CKTOTAL, CKMB, CKMBINDEX, TROPONINI in the last 168 hours. BNP (last 3 results) No results for input(s): PROBNP in the last 8760 hours. HbA1C: No results for input(s): HGBA1C in the last 72 hours. CBG:  Recent Labs Lab 01/07/16 1142 01/07/16 1626 01/07/16 2134 01/08/16 0754 01/08/16 1200  GLUCAP 398* 270* 179* 119* 329*   Lipid Profile: No results for input(s): CHOL, HDL, LDLCALC, TRIG, CHOLHDL, LDLDIRECT in the last 72 hours. Thyroid Function Tests: No results for input(s): TSH, T4TOTAL, FREET4, T3FREE, THYROIDAB in the last 72 hours. Anemia Panel: No results for input(s): VITAMINB12, FOLATE, FERRITIN, TIBC, IRON, RETICCTPCT in the last 72 hours. Sepsis Labs:  Recent Labs Lab 01/05/16 0604 01/06/16 1051  LATICACIDVEN 1.98* 2.1*    Recent Results (from the past 240 hour(s))  Culture, blood (Routine X 2) w Reflex to ID Panel     Status: None (Preliminary result)   Collection Time: 01/05/16  5:45 AM  Result Value Ref Range Status   Specimen Description BLOOD LEFT ANTECUBITAL  Final   Special Requests BOTTLES DRAWN AEROBIC AND ANAEROBIC 6CC EACH  Final   Culture NO GROWTH 3 DAYS  Final   Report Status PENDING  Incomplete  Culture, blood (Routine X 2) w Reflex to ID Panel     Status: None (Preliminary result)   Collection Time: 01/05/16  5:51 AM  Result Value Ref Range Status   Specimen Description   Final    BLOOD BLOOD RIGHT HAND AEROBIC RH BLOOD LEFT HAND ANAEROBIC LH   Special Requests BOTTLES DRAWN AEROBIC AND ANAEROBIC 6CC EACH  Final   Culture NO GROWTH 3 DAYS   Final   Report Status PENDING  Incomplete  Urine culture     Status: None   Collection Time: 01/05/16  6:11 AM  Result Value Ref Range Status   Specimen Description URINE, CATHETERIZED  Final   Special Requests NONE  Final   Culture NO GROWTH Performed at [facility]   Final   Report Status 01/06/2016 FINAL  Final  MRSA PCR Screening     Status: None   Collection Time: 01/05/16  8:57 AM  Result Value Ref Range Status   MRSA by PCR NEGATIVE NEGATIVE Final    Comment:        The GeneXpert MRSA Assay (FDA approved for NASAL specimens only), is one component of a comprehensive MRSA colonization surveillance program. It is not intended to  diagnose MRSA infection nor to guide or monitor treatment for MRSA infections.   Culture, blood (routine x 2) Call MD if unable to obtain prior to antibiotics being given     Status: None (Preliminary result)   Collection Time: 01/05/16  9:54 AM  Result Value Ref Range Status   Specimen Description BLOOD RIGHT ANTECUBITAL  Final   Special Requests BOTTLES DRAWN AEROBIC AND ANAEROBIC 10 cc each  Final   Culture NO GROWTH 3 DAYS  Final   Report Status PENDING  Incomplete  Culture, blood (routine x 2) Call MD if unable to obtain prior to antibiotics being given     Status: None (Preliminary result)   Collection Time: 01/05/16 10:03 AM  Result Value Ref Range Status   Specimen Description BLOOD BLOOD LEFT HAND  Final   Special Requests BOTTLES DRAWN AEROBIC AND ANAEROBIC 6 cc each  Final   Culture NO GROWTH 3 DAYS  Final   Report Status PENDING  Incomplete  Culture, sputum-assessment     Status: None   Collection Time: 01/05/16  3:00 PM  Result Value Ref Range Status   Specimen Description EXPECTORATED SPUTUM  Final   Special Requests NONE  Final   Sputum evaluation   Final    THIS SPECIMEN IS ACCEPTABLE. RESPIRATORY CULTURE REPORT TO FOLLOW. Performed at [facility]    Report Status 01/05/2016 FINAL  Final  Culture,  respiratory (NON-Expectorated)     Status: None   Collection Time: 01/05/16  3:00 PM  Result Value Ref Range Status   Specimen Description EXPECTORATED SPUTUM  Final   Special Requests NONE  Final   Gram Stain   Final    ABUNDANT WBC PRESENT, PREDOMINANTLY PMN ABUNDANT GRAM POSITIVE COCCI IN PAIRS IN CLUSTERS MODERATE GRAM NEGATIVE RODS FEW GRAM NEGATIVE COCCOBACILLI FEW GRAM POSITIVE RODS FEW GRAM VARIABLE ROD FEW SQUAMOUS EPITHELIAL CELLS PRESENT FEW GRAM NEGATIVE DIPLOCOCCI    Culture   Final    Consistent with normal respiratory flora. Performed at [facility]    Report Status 01/08/2016 FINAL  Final         Radiology Studies: Dg Chest Port 1 View  Result Date: 01/08/2016 CLINICAL DATA:  Respiratory failure. History of COPD, former smoker, coronary artery disease, and atrial fibrillation. EXAM: PORTABLE CHEST 1 VIEW COMPARISON:  Portable chest x-ray of January 05, 2016 FINDINGS: There remains a small right pleural effusion. Infiltrate at the right lung base persists. The interstitial markings of both lungs are increased. There is no left pleural effusion. The heart is mildly enlarged. The central pulmonary vascularity is prominent. There is calcification within the wall of the aortic arch. The observed bony thorax is unremarkable. IMPRESSION: Right lower lobe atelectasis or pneumonia with small right pleural effusion. CHF with mild pulmonary interstitial edema which has become more conspicuous since the earlier study. Aortic atherosclerosis. Electronically Signed   By: David  [place] M.D.   On: 01/08/2016 09:21        Scheduled Meds: . apixaban  5 mg Oral BID  . arformoterol  15 mcg Nebulization BID  . budesonide (PULMICORT) nebulizer solution  0.5 mg Nebulization BID  . ceFEPime (MAXIPIME) IV  1 g Intravenous Q12H  . diltiazem  90 mg Oral Q6H  . furosemide  40 mg Oral Daily  . guaiFENesin  1,200 mg Oral BID  . insulin aspart  0-20 Units Subcutaneous TID WC  .  insulin aspart  0-5 Units Subcutaneous QHS  . insulin glargine  15  Units Subcutaneous BID  . ipratropium  0.5 mg Nebulization Q4H  . levalbuterol  0.63 mg Nebulization Q4H  . levothyroxine  88 mcg Oral QAC breakfast  . methylPREDNISolone (SOLU-MEDROL) injection  60 mg Intravenous Q6H  . metoprolol tartrate  25 mg Oral BID  . pantoprazole  40 mg Oral Daily  . rosuvastatin  10 mg Oral QHS  . sodium chloride flush  3 mL Intravenous Q12H  . vancomycin  750 mg Intravenous Q12H   Continuous Infusions:   LOS: 3 days   Time spent: > 35 mins  Penny PiaVEGA, Tymia Streb, MD [facility] Pager [phone]  If 7PM-7AM, please contact night-coverage [email or web address] Password [email or web address] 01/08/2016, 1:12 PM

## 2016-01-09 ENCOUNTER — Inpatient Hospital Stay (HOSPITAL_COMMUNITY): Payer: Medicare Other

## 2016-01-09 LAB — GLUCOSE, CAPILLARY
GLUCOSE-CAPILLARY: 428 mg/dL — AB (ref 65–99)
Glucose-Capillary: 411 mg/dL — ABNORMAL HIGH (ref 65–99)

## 2016-01-09 LAB — CBC
HCT: 41.9 % (ref 36.0–46.0)
HEMOGLOBIN: 12.5 g/dL (ref 12.0–15.0)
MCH: 29.6 pg (ref 26.0–34.0)
MCHC: 29.8 g/dL — AB (ref 30.0–36.0)
MCV: 99.1 fL (ref 78.0–100.0)
PLATELETS: 208 10*3/uL (ref 150–400)
RBC: 4.23 MIL/uL (ref 3.87–5.11)
RDW: 14.3 % (ref 11.5–15.5)
WBC: 9.1 10*3/uL (ref 4.0–10.5)

## 2016-01-09 LAB — GLUCOSE, RANDOM: GLUCOSE: 392 mg/dL — AB (ref 65–99)

## 2016-01-09 MED ORDER — IMMUNE GLOBULIN (HUMAN) 20 GM/200ML IV SOLN
2.0000 g/kg | INTRAVENOUS | Status: AC
  Administered 2016-01-09: 140 g via INTRAVENOUS
  Filled 2016-01-09: qty 200

## 2016-01-09 MED ORDER — DOXYCYCLINE HYCLATE 100 MG PO TABS
100.0000 mg | ORAL_TABLET | Freq: Two times a day (BID) | ORAL | Status: DC
  Administered 2016-01-09 – 2016-01-15 (×13): 100 mg via ORAL
  Filled 2016-01-09 (×13): qty 1

## 2016-01-09 NOTE — Progress Notes (Signed)
PROGRESS NOTE    [name] [name]  WUJ:[id number] DOB: 02/09/1941 DOA: 01/05/2016 PCP: Ignatius Speckinghruv B Vyas, MD    Brief Narrative:  This is 75 year old female with a history of atrial fibrillation, COPD and chronic respiratory failure on home oxygen who presents to the hospital with 2 weeks of worsening shortness of breath. Chest x-ray indicated pneumonia and she was also found to have COPD exacerbation as well as rapid atrial fibrillation. She was placed on BiPAP and admitted to the stepdown unit for further treatment.  Assessment & Plan:   Active Problems:   Essential hypertension, benign   COPD with acute exacerbation (HCC)   Atrial fibrillation with RVR (HCC)   Chronic anticoagulation-CHADs VASc=6   Acute on chronic respiratory failure with hypoxia (HCC)   Uncontrolled type 2 diabetes mellitus with complication (HCC)   HCAP (healthcare-associated pneumonia)  1. Acute on chronic respiratory failure with hypoxia. Likely related to HCAP and COPD exacerbation. She is chronically on 2-3L at home.  - Pulmonology on Board and managing.  2. HCAP - Will narrow antibiotic regimen and place on doxycycline.   3. COPD exacerbation. Continue intravenous steroids. Continue bronchodilators and mucolytics. She is on antibiotics. She continues to wheeze. Add flutter valve  4. Uncontrolled diabetes. Started the patient on Lantus and resistant sliding-scale. Blood sugars have since improved  5. A. fib with RVR. CHADSVASc 6. She is anticoagulated with Eliquis.  Currently on cardizem 90 mg po q 6hr. Continue metoprolol. Cardiology assisting. Rate control improving.  6. Hypertension. Blood pressures are currently stable.  DVT prophylaxis: eliquis Code Status: full Family Communication: discussed with grand daughter at the bedside Disposition Plan: discharge home once improved   Consultants:   Pulmonology  Procedures:     Antimicrobials:   Vancomycin 11/7>>  cefepime 11/7>>     Subjective: She denies any new problems and states that her breathing is improved.  Objective: Vitals:   01/09/16 0745 01/09/16 0752 01/09/16 0757 01/09/16 0800  BP:    131/76  Pulse: 76   95  Resp: (!) 23   (!) 23  Temp: 97 F (36.1 C)     TempSrc: Oral     SpO2: 93% 96% 97% 98%  Weight:      Height:        Intake/Output Summary (Last 24 hours) at 01/09/16 1027 Last data filed at 01/09/16 0500  Gross per 24 hour  Intake              120 ml  Output             1550 ml  Net            -1430 ml   Filed Weights   01/07/16 0500 01/08/16 0500 01/09/16 0505  Weight: 74.6 kg (164 lb 7.4 oz) 73.4 kg (161 lb 13.1 oz) 71 kg (156 lb 8.4 oz)    Examination:  General exam: Appears calm and comfortable, in nad. Respiratory system: expiratory wheeze BL mild. Respiratory effort normal. Equal chest rise Cardiovascular system: S1 & S2 heard, irregular. No JVD, murmurs, rubs, gallops or clicks. No pedal edema. Gastrointestinal system: Abdomen is nondistended, soft and nontender. No organomegaly or masses felt. Normal bowel sounds heard. Central nervous system: Alert and oriented. No focal neurological deficits. Extremities: Symmetric 5 x 5 power. Skin: No rashes, lesions or ulcers Psychiatry: Judgement and insight appear normal. Mood & affect appropriate.   Data Reviewed: I have personally reviewed following labs and imaging studies  CBC:  Recent  Labs Lab 01/05/16 0551 01/06/16 0513 01/07/16 0555 01/09/16 0502  WBC 13.5* 9.5 9.3 9.1  NEUTROABS 11.2*  --   --   --   HGB 12.9 11.5* 11.8* 12.5  HCT 42.3 37.5 38.6 41.9  MCV 97.9 96.9 98.0 99.1  PLT 302 191 230 208   Basic Metabolic Panel:  Recent Labs Lab 01/05/16 0551 01/06/16 0513 01/07/16 0555 01/08/16 1346  NA 135 139 134* 135  K 3.7 3.1* 3.8 3.0*  CL 94* 96* 94* 88*  CO2 32 37* 33* 37*  GLUCOSE 399* 108* 334* 425*  BUN 29* 17 30* 31*  CREATININE 0.93 0.78 0.98 1.01*  CALCIUM 9.0 8.5* 8.6* 8.6*    GFR: Estimated Creatinine Clearance: 45.1 mL/min (by C-G formula based on SCr of 1.01 mg/dL (H)). Liver Function Tests:  Recent Labs Lab 01/06/16 0513  AST 21  ALT 43  ALKPHOS 93  BILITOT 1.2  PROT 5.6*  ALBUMIN 3.1*   No results for input(s): LIPASE, AMYLASE in the last 168 hours. No results for input(s): AMMONIA in the last 168 hours. Coagulation Profile: No results for input(s): INR, PROTIME in the last 168 hours. Cardiac Enzymes: No results for input(s): CKTOTAL, CKMB, CKMBINDEX, TROPONINI in the last 168 hours. BNP (last 3 results) No results for input(s): PROBNP in the last 8760 hours. HbA1C: No results for input(s): HGBA1C in the last 72 hours. CBG:  Recent Labs Lab 01/07/16 2134 01/08/16 0754 01/08/16 1200 01/08/16 1630 01/08/16 2045  GLUCAP 179* 119* 329* 422* 282*   Lipid Profile: No results for input(s): CHOL, HDL, LDLCALC, TRIG, CHOLHDL, LDLDIRECT in the last 72 hours. Thyroid Function Tests: No results for input(s): TSH, T4TOTAL, FREET4, T3FREE, THYROIDAB in the last 72 hours. Anemia Panel: No results for input(s): VITAMINB12, FOLATE, FERRITIN, TIBC, IRON, RETICCTPCT in the last 72 hours. Sepsis Labs:  Recent Labs Lab 01/05/16 0604 01/06/16 1051  LATICACIDVEN 1.98* 2.1*    Recent Results (from the past 240 hour(s))  Culture, blood (Routine X 2) w Reflex to ID Panel     Status: None (Preliminary result)   Collection Time: 01/05/16  5:45 AM  Result Value Ref Range Status   Specimen Description BLOOD LEFT ANTECUBITAL  Final   Special Requests BOTTLES DRAWN AEROBIC AND ANAEROBIC 6CC EACH  Final   Culture NO GROWTH 4 DAYS  Final   Report Status PENDING  Incomplete  Culture, blood (Routine X 2) w Reflex to ID Panel     Status: None (Preliminary result)   Collection Time: 01/05/16  5:51 AM  Result Value Ref Range Status   Specimen Description   Final    BLOOD BLOOD RIGHT HAND AEROBIC RH BLOOD LEFT HAND ANAEROBIC LH   Special Requests BOTTLES  DRAWN AEROBIC AND ANAEROBIC 6CC EACH  Final   Culture NO GROWTH 4 DAYS  Final   Report Status PENDING  Incomplete  Urine culture     Status: None   Collection Time: 01/05/16  6:11 AM  Result Value Ref Range Status   Specimen Description URINE, CATHETERIZED  Final   Special Requests NONE  Final   Culture NO GROWTH Performed at [facility]   Final   Report Status 01/06/2016 FINAL  Final  MRSA PCR Screening     Status: None   Collection Time: 01/05/16  8:57 AM  Result Value Ref Range Status   MRSA by PCR NEGATIVE NEGATIVE Final    Comment:        The GeneXpert MRSA Assay (FDA  approved for NASAL specimens only), is one component of a comprehensive MRSA colonization surveillance program. It is not intended to diagnose MRSA infection nor to guide or monitor treatment for MRSA infections.   Culture, blood (routine x 2) Call MD if unable to obtain prior to antibiotics being given     Status: None (Preliminary result)   Collection Time: 01/05/16  9:54 AM  Result Value Ref Range Status   Specimen Description BLOOD RIGHT ANTECUBITAL  Final   Special Requests BOTTLES DRAWN AEROBIC AND ANAEROBIC 10 cc each  Final   Culture NO GROWTH 4 DAYS  Final   Report Status PENDING  Incomplete  Culture, blood (routine x 2) Call MD if unable to obtain prior to antibiotics being given     Status: None (Preliminary result)   Collection Time: 01/05/16 10:03 AM  Result Value Ref Range Status   Specimen Description BLOOD BLOOD LEFT HAND  Final   Special Requests BOTTLES DRAWN AEROBIC AND ANAEROBIC 6 cc each  Final   Culture NO GROWTH 4 DAYS  Final   Report Status PENDING  Incomplete  Culture, sputum-assessment     Status: None   Collection Time: 01/05/16  3:00 PM  Result Value Ref Range Status   Specimen Description EXPECTORATED SPUTUM  Final   Special Requests NONE  Final   Sputum evaluation   Final    THIS SPECIMEN IS ACCEPTABLE. RESPIRATORY CULTURE REPORT TO FOLLOW. [facility]    Report Status 01/05/2016 FINAL  Final  Culture, respiratory (NON-Expectorated)     Status: None   Collection Time: 01/05/16  3:00 PM  Result Value Ref Range Status   Specimen Description EXPECTORATED SPUTUM  Final   Special Requests NONE  Final   Gram Stain   Final    ABUNDANT WBC PRESENT, PREDOMINANTLY PMN ABUNDANT GRAM POSITIVE COCCI IN PAIRS IN CLUSTERS MODERATE GRAM NEGATIVE RODS FEW GRAM NEGATIVE COCCOBACILLI FEW GRAM POSITIVE RODS FEW GRAM VARIABLE ROD FEW SQUAMOUS EPITHELIAL CELLS PRESENT FEW GRAM NEGATIVE DIPLOCOCCI    Culture   Final    Consistent with normal respiratory flora. [facility]    Report Status 01/08/2016 FINAL  Final         Radiology Studies: Dg Chest Port 1 View  Result Date: 01/09/2016 CLINICAL DATA:  Patient with worsening shortness of breath. Follow-up chest radiograph. EXAM: PORTABLE CHEST 1 VIEW COMPARISON:  Chest radiograph 01/08/2016. FINDINGS: Monitoring leads overlie the patient. Stable enlarged cardiac and mediastinal contours with calcification of the thoracic aorta. Pulmonary vascular redistribution. Persistent small layering right pleural effusion with underlying pulmonary consolidation. No pneumothorax. IMPRESSION: Persistent small right pleural effusion and consolidative opacities within the right lower lobe which may represent pneumonia and/or atelectasis. Pulmonary vascular redistribution. Aortic atherosclerosis. Electronically Signed   By: Annia Beltrew  Davis M.D.   On: 01/09/2016 09:05   Dg Chest Port 1 View  Result Date: 01/08/2016 CLINICAL DATA:  Respiratory failure. History of COPD, former smoker, coronary artery disease, and atrial fibrillation. EXAM: PORTABLE CHEST 1 VIEW COMPARISON:  Portable chest x-ray of January 05, 2016 FINDINGS: There remains a small right pleural effusion. Infiltrate at the right lung base persists. The interstitial markings of both lungs are increased. There is no left pleural  effusion. The heart is mildly enlarged. The central pulmonary vascularity is prominent. There is calcification within the wall of the aortic arch. The observed bony thorax is unremarkable. IMPRESSION: Right lower lobe atelectasis or pneumonia with small right pleural effusion.  CHF with mild pulmonary interstitial edema which has become more conspicuous since the earlier study. Aortic atherosclerosis. Electronically Signed   By: David  [place] M.D.   On: 01/08/2016 09:21        Scheduled Meds: . apixaban  5 mg Oral BID  . arformoterol  15 mcg Nebulization BID  . budesonide (PULMICORT) nebulizer solution  0.5 mg Nebulization BID  . ceFEPime (MAXIPIME) IV  1 g Intravenous Q12H  . diltiazem  90 mg Oral Q6H  . furosemide  40 mg Intravenous Daily  . guaiFENesin  1,200 mg Oral BID  . Immune Globulin 10%  2 g/kg Intravenous Q24H  . insulin aspart  0-20 Units Subcutaneous TID WC  . insulin aspart  0-5 Units Subcutaneous QHS  . insulin glargine  15 Units Subcutaneous BID  . ipratropium  0.5 mg Nebulization Q4H  . levalbuterol  0.63 mg Nebulization Q4H  . levothyroxine  88 mcg Oral QAC breakfast  . methylPREDNISolone (SOLU-MEDROL) injection  60 mg Intravenous Q6H  . metoprolol tartrate  25 mg Oral TID  . pantoprazole  40 mg Oral Daily  . rosuvastatin  10 mg Oral QHS  . sodium chloride flush  3 mL Intravenous Q12H  . vancomycin  750 mg Intravenous Q12H   Continuous Infusions:   LOS: 4 days   Time spent: > 35 mins  Penny Pia, MD [facility] Pager [phone]  If 7PM-7AM, please contact night-coverage [email or web address] Password [email or web address] 01/09/2016, 10:27 AM

## 2016-01-09 NOTE — Progress Notes (Signed)
Subjective: She says she feels better. Less short of breath. Off BiPAP now. At home she has oxygen nebulizer but does not have CPAP or BiPAP. She does have risk for aspiration per speech therapy. She does have slightly low IgG level which may explain some of her problem with recurrent infections. Was systems no nausea vomiting diarrhea. No chest pain. No hemoptysis  Objective: Vital signs in last 24 hours: Temp:  [96.7 F (35.9 C)-97.7 F (36.5 C)] 97 F (36.1 C) (11/11 0745) Pulse Rate:  [76-123] 95 (11/11 0800) Resp:  [22-32] 23 (11/11 0800) BP: (124-131)/(70-77) 131/76 (11/11 0800) SpO2:  [88 %-98 %] 98 % (11/11 0800) FiO2 (%):  [40 %-45 %] 45 % (11/10 2345) Weight:  [71 kg (156 lb 8.4 oz)] 71 kg (156 lb 8.4 oz) (11/11 0505) Weight change: -2.4 kg (-5 lb 4.7 oz) Last BM Date: 01/07/16  Intake/Output from previous day: 11/10 0701 - 11/11 0700 In: 365 [P.O.:360; I.V.:5] Out: 2100 [Urine:2100]  PHYSICAL EXAM General appearance: alert, cooperative and no distress Resp: clear to auscultation bilaterally Cardio: irregularly irregular rhythm GI: soft, non-tender; bowel sounds normal; no masses,  no organomegaly Extremities: extremities normal, atraumatic, no cyanosis or edema Skin warm and dry. Mucous membranes are moist  Lab Results:  Results for orders placed or performed during the hospital encounter of 01/05/16 (from the past 48 hour(s))  Immunoglobulins, QN, A/E/G/M     Status: Abnormal   Collection Time: 01/07/16  9:52 AM  Result Value Ref Range   IgG (Immunoglobin G), Serum 528 (L) 700 - 1,600 mg/dL   IgA 311 64 - 422 mg/dL   IgM, Serum 34 26 - 217 mg/dL   IgE (Immunoglobulin E), Serum 343 (H) 0 - 100 IU/mL    Comment: (NOTE) Performed At: [facility] [address] [place], [place] [id number] Lindon Romp MD AT:[id number]   Glucose, capillary     Status: Abnormal   Collection Time: 01/07/16 11:42 AM  Result Value Ref Range   Glucose-Capillary 398 (H)  65 - 99 mg/dL   Comment 1 Notify RN    Comment 2 Document in Chart   Glucose, capillary     Status: Abnormal   Collection Time: 01/07/16  4:26 PM  Result Value Ref Range   Glucose-Capillary 270 (H) 65 - 99 mg/dL   Comment 1 Notify RN    Comment 2 Document in Chart   Glucose, capillary     Status: Abnormal   Collection Time: 01/07/16  9:34 PM  Result Value Ref Range   Glucose-Capillary 179 (H) 65 - 99 mg/dL  Vancomycin, trough     Status: None   Collection Time: 01/08/16  5:15 AM  Result Value Ref Range   Vancomycin Tr 18 15 - 20 ug/mL  Glucose, capillary     Status: Abnormal   Collection Time: 01/08/16  7:54 AM  Result Value Ref Range   Glucose-Capillary 119 (H) 65 - 99 mg/dL   Comment 1 Notify RN    Comment 2 Document in Chart   Glucose, capillary     Status: Abnormal   Collection Time: 01/08/16 12:00 PM  Result Value Ref Range   Glucose-Capillary 329 (H) 65 - 99 mg/dL   Comment 1 Notify RN    Comment 2 Document in Chart   Basic metabolic panel     Status: Abnormal   Collection Time: 01/08/16  1:46 PM  Result Value Ref Range   Sodium 135 135 - 145 mmol/L   Potassium  3.0 (L) 3.5 - 5.1 mmol/L    Comment: DELTA CHECK NOTED   Chloride 88 (L) 101 - 111 mmol/L   CO2 37 (H) 22 - 32 mmol/L   Glucose, Bld 425 (H) 65 - 99 mg/dL   BUN 31 (H) 6 - 20 mg/dL   Creatinine, Ser 1.01 (H) 0.44 - 1.00 mg/dL   Calcium 8.6 (L) 8.9 - 10.3 mg/dL   GFR calc non Af Amer 53 (L) >60 mL/min   GFR calc Af Amer >60 >60 mL/min    Comment: (NOTE) The eGFR has been calculated using the CKD EPI equation. This calculation has not been validated in all clinical situations. eGFR's persistently <60 mL/min signify possible Chronic Kidney Disease.    Anion gap 10 5 - 15  Glucose, capillary     Status: Abnormal   Collection Time: 01/08/16  4:30 PM  Result Value Ref Range   Glucose-Capillary 422 (H) 65 - 99 mg/dL   Comment 1 Notify RN    Comment 2 Document in Chart   Glucose, capillary     Status:  Abnormal   Collection Time: 01/08/16  8:45 PM  Result Value Ref Range   Glucose-Capillary 282 (H) 65 - 99 mg/dL   Comment 1 Notify RN    Comment 2 Document in Chart   CBC     Status: Abnormal   Collection Time: 01/09/16  5:02 AM  Result Value Ref Range   WBC 9.1 4.0 - 10.5 K/uL   RBC 4.23 3.87 - 5.11 MIL/uL   Hemoglobin 12.5 12.0 - 15.0 g/dL   HCT 41.9 36.0 - 46.0 %   MCV 99.1 78.0 - 100.0 fL   MCH 29.6 26.0 - 34.0 pg   MCHC 29.8 (L) 30.0 - 36.0 g/dL   RDW 14.3 11.5 - 15.5 %   Platelets 208 150 - 400 K/uL    ABGS No results for input(s): PHART, PO2ART, TCO2, HCO3 in the last 72 hours.  Invalid input(s): PCO2 CULTURES Recent Results (from the past 240 hour(s))  Culture, blood (Routine X 2) w Reflex to ID Panel     Status: None (Preliminary result)   Collection Time: 01/05/16  5:45 AM  Result Value Ref Range Status   Specimen Description BLOOD LEFT ANTECUBITAL  Final   Special Requests BOTTLES DRAWN AEROBIC AND ANAEROBIC [place]  Final   Culture NO GROWTH 4 DAYS  Final   Report Status PENDING  Incomplete  Culture, blood (Routine X 2) w Reflex to ID Panel     Status: None (Preliminary result)   Collection Time: 01/05/16  5:51 AM  Result Value Ref Range Status   Specimen Description   Final    BLOOD BLOOD RIGHT HAND AEROBIC RH BLOOD LEFT HAND ANAEROBIC LH   Special Requests BOTTLES DRAWN AEROBIC AND ANAEROBIC [place]  Final   Culture NO GROWTH 4 DAYS  Final   Report Status PENDING  Incomplete  Urine culture     Status: None   Collection Time: 01/05/16  6:11 AM  Result Value Ref Range Status   Specimen Description URINE, CATHETERIZED  Final   Special Requests NONE  Final   Culture NO GROWTH Performed at [facility]   Final   Report Status 01/06/2016 FINAL  Final  MRSA PCR Screening     Status: None   Collection Time: 01/05/16  8:57 AM  Result Value Ref Range Status   MRSA by PCR NEGATIVE NEGATIVE Final    Comment:  The GeneXpert MRSA Assay  (FDA approved for NASAL specimens only), is one component of a comprehensive MRSA colonization surveillance program. It is not intended to diagnose MRSA infection nor to guide or monitor treatment for MRSA infections.   Culture, blood (routine x 2) Call MD if unable to obtain prior to antibiotics being given     Status: None (Preliminary result)   Collection Time: 01/05/16  9:54 AM  Result Value Ref Range Status   Specimen Description BLOOD RIGHT ANTECUBITAL  Final   Special Requests BOTTLES DRAWN AEROBIC AND ANAEROBIC 10 cc each  Final   Culture NO GROWTH 4 DAYS  Final   Report Status PENDING  Incomplete  Culture, blood (routine x 2) Call MD if unable to obtain prior to antibiotics being given     Status: None (Preliminary result)   Collection Time: 01/05/16 10:03 AM  Result Value Ref Range Status   Specimen Description BLOOD BLOOD LEFT HAND  Final   Special Requests BOTTLES DRAWN AEROBIC AND ANAEROBIC 6 cc each  Final   Culture NO GROWTH 4 DAYS  Final   Report Status PENDING  Incomplete  Culture, sputum-assessment     Status: None   Collection Time: 01/05/16  3:00 PM  Result Value Ref Range Status   Specimen Description EXPECTORATED SPUTUM  Final   Special Requests NONE  Final   Sputum evaluation   Final    THIS SPECIMEN IS ACCEPTABLE. RESPIRATORY CULTURE REPORT TO FOLLOW. Performed at [facility]    Report Status 01/05/2016 FINAL  Final  Culture, respiratory (NON-Expectorated)     Status: None   Collection Time: 01/05/16  3:00 PM  Result Value Ref Range Status   Specimen Description EXPECTORATED SPUTUM  Final   Special Requests NONE  Final   Gram Stain   Final    ABUNDANT WBC PRESENT, PREDOMINANTLY PMN ABUNDANT GRAM POSITIVE COCCI IN PAIRS IN CLUSTERS MODERATE GRAM NEGATIVE RODS FEW GRAM NEGATIVE COCCOBACILLI FEW GRAM POSITIVE RODS FEW GRAM VARIABLE ROD FEW SQUAMOUS EPITHELIAL CELLS PRESENT FEW GRAM NEGATIVE DIPLOCOCCI    Culture   Final    Consistent with  normal respiratory flora. Performed at [facility]    Report Status 01/08/2016 FINAL  Final   Studies/Results: Dg Chest Port 1 View  Result Date: 01/08/2016 CLINICAL DATA:  Respiratory failure. History of COPD, former smoker, coronary artery disease, and atrial fibrillation. EXAM: PORTABLE CHEST 1 VIEW COMPARISON:  Portable chest x-ray of January 05, 2016 FINDINGS: There remains a small right pleural effusion. Infiltrate at the right lung base persists. The interstitial markings of both lungs are increased. There is no left pleural effusion. The heart is mildly enlarged. The central pulmonary vascularity is prominent. There is calcification within the wall of the aortic arch. The observed bony thorax is unremarkable. IMPRESSION: Right lower lobe atelectasis or pneumonia with small right pleural effusion. CHF with mild pulmonary interstitial edema which has become more conspicuous since the earlier study. Aortic atherosclerosis. Electronically Signed   By: David  Martinique M.D.   On: 01/08/2016 09:21    Medications:  Prior to Admission:  Prescriptions Prior to Admission  Medication Sig Dispense Refill Last Dose  . ALPRAZolam (XANAX) 0.5 MG tablet Take 0.5 mg by mouth 3 (three) times daily as needed for anxiety.   01/04/2016 at Unknown time  . dexlansoprazole (DEXILANT) 60 MG capsule Take 60 mg by mouth daily.     01/04/2016 at Unknown time  . diltiazem (CARDIZEM CD) 360 MG 24 hr  capsule Take 1 capsule (360 mg total) by mouth daily. 30 capsule 0 01/04/2016 at Unknown time  . ELIQUIS 5 MG TABS tablet TAKE 1 TABLET BY MOUTH TWICE DAILY 60 tablet 5 01/04/2016 at 1700  . Fluticasone Furoate-Vilanterol (BREO ELLIPTA) 100-25 MCG/INH AEPB Inhale 1 puff into the lungs daily as needed (wheezing/shortness of breath).    unknown  . furosemide (LASIX) 40 MG tablet Take 1 tablet (40 mg total) by mouth daily. 30 tablet 1 01/04/2016 at Unknown time  . glipiZIDE (GLUCOTROL) 10 MG tablet Take 1 tablet (10 mg  total) by mouth 2 (two) times daily before a meal. Do not take glipizide if blood sugar falls below 125.   01/04/2016 at Unknown time  . HYDROcodone-acetaminophen (NORCO/VICODIN) 5-325 MG tablet Take 1 tablet by mouth 2 (two) times daily as needed for moderate pain.   unknown  . insulin glargine (LANTUS) 100 UNIT/ML injection Inject 0.15 mLs (15 Units total) into the skin 2 (two) times daily. 10 mL 11 01/04/2016 at Unknown time  . Insulin Syringes, Disposable, U-100 1 ML MISC 15 Units by Does not apply route 2 (two) times daily. 200 each 6 01/04/2016 at Unknown time  . levalbuterol (XOPENEX) 0.63 MG/3ML nebulizer solution Take 3 mLs (0.63 mg total) by nebulization every 3 (three) hours as needed for wheezing or shortness of breath. 3 mL 12 01/04/2016 at Unknown time  . levothyroxine (SYNTHROID, LEVOTHROID) 88 MCG tablet Take 88 mcg by mouth daily before breakfast.   01/04/2016 at Unknown time  . meclizine (ANTIVERT) 25 MG tablet Take 12.5 mg by mouth daily as needed for dizziness.   unknown  . metoprolol tartrate (LOPRESSOR) 25 MG tablet Take 25 mg by mouth 2 (two) times daily.   01/04/2016 at 1700  . nitroGLYCERIN (NITROSTAT) 0.4 MG SL tablet Place 1 tablet (0.4 mg total) under the tongue every 5 (five) minutes x 3 doses as needed for chest pain. For severe chest pain 25 tablet 3 unknown  . OXYGEN Inhale 2-3 L into the lungs continuous.   01/04/2016 at Unknown time  . rosuvastatin (CRESTOR) 10 MG tablet Take 1 tablet (10 mg total) by mouth daily. (Patient taking differently: Take 10 mg by mouth at bedtime. ) 30 tablet 6 01/04/2016 at Unknown time  . Vitamin D, Ergocalciferol, (DRISDOL) 50000 units CAPS capsule Take 50,000 Units by mouth every 7 (seven) days. Takes on Mondays.   Past Week at Unknown time  . [DISCONTINUED] levofloxacin (LEVAQUIN) 500 MG tablet Take 1 tablet (500 mg total) by mouth daily. Antibiotic. Starting 12/12/15 take 1 tablet daily until completed. 4 tablet 0   . [DISCONTINUED] predniSONE  (DELTASONE) 10 MG tablet Starting 12/12/15, take 6 tablets daily for 1 day; then 5 tablets the next day for 1 day; then 4 tablets the next day for 1 day; then 3 tablets the next day for 1 day; then 2 tablets the next day for 1 day; then 1 tablet the next day for 1 day; then stop. 21 tablet 0    Scheduled: . apixaban  5 mg Oral BID  . arformoterol  15 mcg Nebulization BID  . budesonide (PULMICORT) nebulizer solution  0.5 mg Nebulization BID  . ceFEPime (MAXIPIME) IV  1 g Intravenous Q12H  . diltiazem  90 mg Oral Q6H  . furosemide  40 mg Intravenous Daily  . guaiFENesin  1,200 mg Oral BID  . insulin aspart  0-20 Units Subcutaneous TID WC  . insulin aspart  0-5 Units Subcutaneous QHS  .  insulin glargine  15 Units Subcutaneous BID  . ipratropium  0.5 mg Nebulization Q4H  . levalbuterol  0.63 mg Nebulization Q4H  . levothyroxine  88 mcg Oral QAC breakfast  . methylPREDNISolone (SOLU-MEDROL) injection  60 mg Intravenous Q6H  . metoprolol tartrate  25 mg Oral TID  . pantoprazole  40 mg Oral Daily  . rosuvastatin  10 mg Oral QHS  . sodium chloride flush  3 mL Intravenous Q12H  . vancomycin  750 mg Intravenous Q12H   Continuous:  XJO:ITGPQD chloride, ALPRAZolam, HYDROcodone-acetaminophen, levalbuterol, meclizine, sodium chloride flush  Assesment: She was admitted with recurrent pneumonia. She has significant COPD at baseline. She has acute on chronic hypoxic respiratory failure and she required BiPAP particularly at night. She is markedly better today.  Her IgG level is low and considering her multiple hospitalizations and infections I think it's reasonable to replace that to see if it makes a difference. IgE level is high suggesting an allergic phenomenon  She is at risk of aspiration.  She has atrial fib with rapid ventricular response and that is significantly better. Active Problems:   Essential hypertension, benign   COPD with acute exacerbation (HCC)   Atrial fibrillation with RVR  (HCC)   Chronic anticoagulation-CHADs VASc=6   Acute on chronic respiratory failure with hypoxia ([place])   Uncontrolled type 2 diabetes mellitus with complication ([place])   HCAP (healthcare-associated pneumonia)    Plan: Continue treatments. Provide IgG. No other changes. She has significantly improved in the last 24 hours    LOS: 4 days   Joye Wesenberg L 01/09/2016, 8:46 AM

## 2016-01-10 LAB — GLUCOSE, CAPILLARY
GLUCOSE-CAPILLARY: 197 mg/dL — AB (ref 65–99)
GLUCOSE-CAPILLARY: 377 mg/dL — AB (ref 65–99)
Glucose-Capillary: 309 mg/dL — ABNORMAL HIGH (ref 65–99)
Glucose-Capillary: 366 mg/dL — ABNORMAL HIGH (ref 65–99)

## 2016-01-10 LAB — CULTURE, BLOOD (ROUTINE X 2)
CULTURE: NO GROWTH
CULTURE: NO GROWTH
CULTURE: NO GROWTH
Culture: NO GROWTH

## 2016-01-10 LAB — BASIC METABOLIC PANEL
Anion gap: 7 (ref 5–15)
BUN: 34 mg/dL — ABNORMAL HIGH (ref 6–20)
CALCIUM: 8.7 mg/dL — AB (ref 8.9–10.3)
CO2: 44 mmol/L — AB (ref 22–32)
CREATININE: 1.09 mg/dL — AB (ref 0.44–1.00)
Chloride: 82 mmol/L — ABNORMAL LOW (ref 101–111)
GFR calc non Af Amer: 49 mL/min — ABNORMAL LOW (ref >60)
GFR, EST AFRICAN AMERICAN: 57 mL/min — AB (ref >60)
GLUCOSE: 326 mg/dL — AB (ref 65–99)
Potassium: 3.2 mmol/L — ABNORMAL LOW (ref 3.5–5.1)
Sodium: 133 mmol/L — ABNORMAL LOW (ref 135–145)

## 2016-01-10 MED ORDER — LEVALBUTEROL HCL 0.63 MG/3ML IN NEBU
0.6300 mg | INHALATION_SOLUTION | Freq: Four times a day (QID) | RESPIRATORY_TRACT | Status: DC
  Administered 2016-01-10 – 2016-01-13 (×14): 0.63 mg via RESPIRATORY_TRACT
  Filled 2016-01-10 (×13): qty 3

## 2016-01-10 MED ORDER — INSULIN GLARGINE 100 UNIT/ML ~~LOC~~ SOLN
18.0000 [IU] | Freq: Two times a day (BID) | SUBCUTANEOUS | Status: DC
  Administered 2016-01-10 – 2016-01-12 (×5): 18 [IU] via SUBCUTANEOUS
  Filled 2016-01-10 (×7): qty 0.18

## 2016-01-10 MED ORDER — IPRATROPIUM BROMIDE 0.02 % IN SOLN
0.5000 mg | Freq: Four times a day (QID) | RESPIRATORY_TRACT | Status: DC
  Administered 2016-01-10 – 2016-01-13 (×14): 0.5 mg via RESPIRATORY_TRACT
  Filled 2016-01-10 (×13): qty 2.5

## 2016-01-10 NOTE — Progress Notes (Addendum)
Subjective: She says she doesn't feel well. She's frustrated with her chronic illness. She wants to know what she could do to keep from coming into the hospital again and we discussed that at length. She might benefit from a rehabilitation stay but at this point she refuses. She is still short of breath. She says she feels palpitations. No chest pain. No nausea vomiting or diarrhea. She's coughing and still able to cough up some sputum  Objective: Vital signs in last 24 hours: Temp:  [96.3 F (35.7 C)-97.9 F (36.6 C)] 96.3 F (35.7 C) (11/12 0740) Pulse Rate:  [56-127] 89 (11/12 0930) Resp:  [16-34] 25 (11/12 0930) BP: (105-196)/(56-95) 151/95 (11/12 0800) SpO2:  [88 %-98 %] 95 % (11/12 0930) FiO2 (%):  [45 %] 45 % (11/12 0746) Weight:  [72.1 kg (158 lb 15.2 oz)] 72.1 kg (158 lb 15.2 oz) (11/12 0500) Weight change: 1.1 kg (2 lb 6.8 oz) Last BM Date: 01/07/16  Intake/Output from previous day: 11/11 0701 - 11/12 0700 In: 2000 [P.O.:600; I.V.:1400] Out: 2650 [Urine:2650]  PHYSICAL EXAM General appearance: alert, cooperative and moderate distress Resp: rhonchi bilaterally Cardio: irregularly irregular rhythm GI: soft, non-tender; bowel sounds normal; no masses,  no organomegaly Extremities: Less edema Skin warm and dry. Mucous membranes are moist. Pupils are reactive she is on nasal cannula  Lab Results:  Results for orders placed or performed during the hospital encounter of 01/05/16 (from the past 48 hour(s))  Glucose, capillary     Status: Abnormal   Collection Time: 01/08/16 12:00 PM  Result Value Ref Range   Glucose-Capillary 329 (H) 65 - 99 mg/dL   Comment 1 Notify RN    Comment 2 Document in Chart   Basic metabolic panel     Status: Abnormal   Collection Time: 01/08/16  1:46 PM  Result Value Ref Range   Sodium 135 135 - 145 mmol/L   Potassium 3.0 (L) 3.5 - 5.1 mmol/L    Comment: DELTA CHECK NOTED   Chloride 88 (L) 101 - 111 mmol/L   CO2 37 (H) 22 - 32 mmol/L    Glucose, Bld 425 (H) 65 - 99 mg/dL   BUN 31 (H) 6 - 20 mg/dL   Creatinine, Ser 1.01 (H) 0.44 - 1.00 mg/dL   Calcium 8.6 (L) 8.9 - 10.3 mg/dL   GFR calc non Af Amer 53 (L) >60 mL/min   GFR calc Af Amer >60 >60 mL/min    Comment: (NOTE) The eGFR has been calculated using the CKD EPI equation. This calculation has not been validated in all clinical situations. eGFR's persistently <60 mL/min signify possible Chronic Kidney Disease.    Anion gap 10 5 - 15  Glucose, capillary     Status: Abnormal   Collection Time: 01/08/16  4:30 PM  Result Value Ref Range   Glucose-Capillary 422 (H) 65 - 99 mg/dL   Comment 1 Notify RN    Comment 2 Document in Chart   Glucose, capillary     Status: Abnormal   Collection Time: 01/08/16  8:45 PM  Result Value Ref Range   Glucose-Capillary 282 (H) 65 - 99 mg/dL   Comment 1 Notify RN    Comment 2 Document in Chart   CBC     Status: Abnormal   Collection Time: 01/09/16  5:02 AM  Result Value Ref Range   WBC 9.1 4.0 - 10.5 K/uL   RBC 4.23 3.87 - 5.11 MIL/uL   Hemoglobin 12.5 12.0 - 15.0 g/dL  HCT 41.9 36.0 - 46.0 %   MCV 99.1 78.0 - 100.0 fL   MCH 29.6 26.0 - 34.0 pg   MCHC 29.8 (L) 30.0 - 36.0 g/dL   RDW 14.3 11.5 - 15.5 %   Platelets 208 150 - 400 K/uL  Glucose, capillary     Status: Abnormal   Collection Time: 01/09/16  5:17 PM  Result Value Ref Range   Glucose-Capillary 428 (H) 65 - 99 mg/dL  Glucose, capillary     Status: Abnormal   Collection Time: 01/09/16  8:33 PM  Result Value Ref Range   Glucose-Capillary 411 (H) 65 - 99 mg/dL   Comment 1 Notify RN   Glucose, random     Status: Abnormal   Collection Time: 01/09/16  9:08 PM  Result Value Ref Range   Glucose, Bld 392 (H) 65 - 99 mg/dL  Glucose, capillary     Status: Abnormal   Collection Time: 01/10/16  7:39 AM  Result Value Ref Range   Glucose-Capillary 197 (H) 65 - 99 mg/dL    ABGS No results for input(s): PHART, PO2ART, TCO2, HCO3 in the last 72 hours.  Invalid input(s):  PCO2 CULTURES Recent Results (from the past 240 hour(s))  Culture, blood (Routine X 2) w Reflex to ID Panel     Status: None   Collection Time: 01/05/16  5:45 AM  Result Value Ref Range Status   Specimen Description BLOOD LEFT ANTECUBITAL  Final   Special Requests BOTTLES DRAWN AEROBIC AND ANAEROBIC [place]  Final   Culture NO GROWTH 5 DAYS  Final   Report Status 01/10/2016 FINAL  Final  Culture, blood (Routine X 2) w Reflex to ID Panel     Status: None   Collection Time: 01/05/16  5:51 AM  Result Value Ref Range Status   Specimen Description   Final    BLOOD BLOOD RIGHT HAND AEROBIC RH BLOOD LEFT HAND ANAEROBIC LH   Special Requests BOTTLES DRAWN AEROBIC AND ANAEROBIC [place]  Final   Culture NO GROWTH 5 DAYS  Final   Report Status 01/10/2016 FINAL  Final  Urine culture     Status: None   Collection Time: 01/05/16  6:11 AM  Result Value Ref Range Status   Specimen Description URINE, CATHETERIZED  Final   Special Requests NONE  Final   Culture NO GROWTH Performed at [facility]   Final   Report Status 01/06/2016 FINAL  Final  MRSA PCR Screening     Status: None   Collection Time: 01/05/16  8:57 AM  Result Value Ref Range Status   MRSA by PCR NEGATIVE NEGATIVE Final    Comment:        The GeneXpert MRSA Assay (FDA approved for NASAL specimens only), is one component of a comprehensive MRSA colonization surveillance program. It is not intended to diagnose MRSA infection nor to guide or monitor treatment for MRSA infections.   Culture, blood (routine x 2) Call MD if unable to obtain prior to antibiotics being given     Status: None   Collection Time: 01/05/16  9:54 AM  Result Value Ref Range Status   Specimen Description BLOOD RIGHT ANTECUBITAL  Final   Special Requests BOTTLES DRAWN AEROBIC AND ANAEROBIC 10 cc each  Final   Culture NO GROWTH 5 DAYS  Final   Report Status 01/10/2016 FINAL  Final  Culture, blood (routine x 2) Call MD if unable to obtain prior to  antibiotics being given  Status: None   Collection Time: 01/05/16 10:03 AM  Result Value Ref Range Status   Specimen Description BLOOD BLOOD LEFT HAND  Final   Special Requests BOTTLES DRAWN AEROBIC AND ANAEROBIC 6 cc each  Final   Culture NO GROWTH 5 DAYS  Final   Report Status 01/10/2016 FINAL  Final  Culture, sputum-assessment     Status: None   Collection Time: 01/05/16  3:00 PM  Result Value Ref Range Status   Specimen Description EXPECTORATED SPUTUM  Final   Special Requests NONE  Final   Sputum evaluation   Final    THIS SPECIMEN IS ACCEPTABLE. RESPIRATORY CULTURE REPORT TO FOLLOW. Performed at [facility]    Report Status 01/05/2016 FINAL  Final  Culture, respiratory (NON-Expectorated)     Status: None   Collection Time: 01/05/16  3:00 PM  Result Value Ref Range Status   Specimen Description EXPECTORATED SPUTUM  Final   Special Requests NONE  Final   Gram Stain   Final    ABUNDANT WBC PRESENT, PREDOMINANTLY PMN ABUNDANT GRAM POSITIVE COCCI IN PAIRS IN CLUSTERS MODERATE GRAM NEGATIVE RODS FEW GRAM NEGATIVE COCCOBACILLI FEW GRAM POSITIVE RODS FEW GRAM VARIABLE ROD FEW SQUAMOUS EPITHELIAL CELLS PRESENT FEW GRAM NEGATIVE DIPLOCOCCI    Culture   Final    Consistent with normal respiratory flora. Performed at [facility]    Report Status 01/08/2016 FINAL  Final   Studies/Results: Dg Chest Port 1 View  Result Date: 01/09/2016 CLINICAL DATA:  Patient with worsening shortness of breath. Follow-up chest radiograph. EXAM: PORTABLE CHEST 1 VIEW COMPARISON:  Chest radiograph 01/08/2016. FINDINGS: Monitoring leads overlie the patient. Stable enlarged cardiac and mediastinal contours with calcification of the thoracic aorta. Pulmonary vascular redistribution. Persistent small layering right pleural effusion with underlying pulmonary consolidation. No pneumothorax. IMPRESSION: Persistent small right pleural effusion and consolidative opacities within the right  lower lobe which may represent pneumonia and/or atelectasis. Pulmonary vascular redistribution. Aortic atherosclerosis. Electronically Signed   By: Lovey Newcomer M.D.   On: 01/09/2016 09:05    Medications:  Prior to Admission:  Prescriptions Prior to Admission  Medication Sig Dispense Refill Last Dose  . ALPRAZolam (XANAX) 0.5 MG tablet Take 0.5 mg by mouth 3 (three) times daily as needed for anxiety.   01/04/2016 at Unknown time  . dexlansoprazole (DEXILANT) 60 MG capsule Take 60 mg by mouth daily.     01/04/2016 at Unknown time  . diltiazem (CARDIZEM CD) 360 MG 24 hr capsule Take 1 capsule (360 mg total) by mouth daily. 30 capsule 0 01/04/2016 at Unknown time  . ELIQUIS 5 MG TABS tablet TAKE 1 TABLET BY MOUTH TWICE DAILY 60 tablet 5 01/04/2016 at 1700  . Fluticasone Furoate-Vilanterol (BREO ELLIPTA) 100-25 MCG/INH AEPB Inhale 1 puff into the lungs daily as needed (wheezing/shortness of breath).    unknown  . furosemide (LASIX) 40 MG tablet Take 1 tablet (40 mg total) by mouth daily. 30 tablet 1 01/04/2016 at Unknown time  . glipiZIDE (GLUCOTROL) 10 MG tablet Take 1 tablet (10 mg total) by mouth 2 (two) times daily before a meal. Do not take glipizide if blood sugar falls below 125.   01/04/2016 at Unknown time  . HYDROcodone-acetaminophen (NORCO/VICODIN) 5-325 MG tablet Take 1 tablet by mouth 2 (two) times daily as needed for moderate pain.   unknown  . insulin glargine (LANTUS) 100 UNIT/ML injection Inject 0.15 mLs (15 Units total) into the skin 2 (two) times daily. 10 mL 11 01/04/2016 at Unknown  time  . Insulin Syringes, Disposable, U-100 1 ML MISC 15 Units by Does not apply route 2 (two) times daily. 200 each 6 01/04/2016 at Unknown time  . levalbuterol (XOPENEX) 0.63 MG/3ML nebulizer solution Take 3 mLs (0.63 mg total) by nebulization every 3 (three) hours as needed for wheezing or shortness of breath. 3 mL 12 01/04/2016 at Unknown time  . levothyroxine (SYNTHROID, LEVOTHROID) 88 MCG tablet Take 88 mcg  by mouth daily before breakfast.   01/04/2016 at Unknown time  . meclizine (ANTIVERT) 25 MG tablet Take 12.5 mg by mouth daily as needed for dizziness.   unknown  . metoprolol tartrate (LOPRESSOR) 25 MG tablet Take 25 mg by mouth 2 (two) times daily.   01/04/2016 at 1700  . nitroGLYCERIN (NITROSTAT) 0.4 MG SL tablet Place 1 tablet (0.4 mg total) under the tongue every 5 (five) minutes x 3 doses as needed for chest pain. For severe chest pain 25 tablet 3 unknown  . OXYGEN Inhale 2-3 L into the lungs continuous.   01/04/2016 at Unknown time  . rosuvastatin (CRESTOR) 10 MG tablet Take 1 tablet (10 mg total) by mouth daily. (Patient taking differently: Take 10 mg by mouth at bedtime. ) 30 tablet 6 01/04/2016 at Unknown time  . Vitamin D, Ergocalciferol, (DRISDOL) 50000 units CAPS capsule Take 50,000 Units by mouth every 7 (seven) days. Takes on Mondays.   Past Week at Unknown time  . [DISCONTINUED] levofloxacin (LEVAQUIN) 500 MG tablet Take 1 tablet (500 mg total) by mouth daily. Antibiotic. Starting 12/12/15 take 1 tablet daily until completed. 4 tablet 0   . [DISCONTINUED] predniSONE (DELTASONE) 10 MG tablet Starting 12/12/15, take 6 tablets daily for 1 day; then 5 tablets the next day for 1 day; then 4 tablets the next day for 1 day; then 3 tablets the next day for 1 day; then 2 tablets the next day for 1 day; then 1 tablet the next day for 1 day; then stop. 21 tablet 0    Scheduled: . apixaban  5 mg Oral BID  . arformoterol  15 mcg Nebulization BID  . budesonide (PULMICORT) nebulizer solution  0.5 mg Nebulization BID  . diltiazem  90 mg Oral Q6H  . doxycycline  100 mg Oral Q12H  . furosemide  40 mg Intravenous Daily  . guaiFENesin  1,200 mg Oral BID  . insulin aspart  0-20 Units Subcutaneous TID WC  . insulin aspart  0-5 Units Subcutaneous QHS  . insulin glargine  18 Units Subcutaneous BID  . ipratropium  0.5 mg Nebulization Q6H  . levalbuterol  0.63 mg Nebulization Q6H  . levothyroxine  88 mcg  Oral QAC breakfast  . methylPREDNISolone (SOLU-MEDROL) injection  60 mg Intravenous Q6H  . metoprolol tartrate  25 mg Oral TID  . pantoprazole  40 mg Oral Daily  . rosuvastatin  10 mg Oral QHS  . sodium chloride flush  3 mL Intravenous Q12H   Continuous:  GYF:VCBSWH chloride, ALPRAZolam, HYDROcodone-acetaminophen, levalbuterol, meclizine, sodium chloride flush  Assesment: She was admitted with acute on chronic hypoxic respiratory failure with healthcare associated pneumonia COPD exacerbation atrial fib with rapid ventricular response and she has some element of heart failure as well. She had low IgG level and that was replaced yesterday. She is very weak. Active Problems:   Essential hypertension, benign   COPD with acute exacerbation (HCC)   Atrial fibrillation with RVR (HCC)   Chronic anticoagulation-CHADs VASc=6   Acute on chronic respiratory failure with hypoxia ([place])  Uncontrolled type 2 diabetes mellitus with complication ([place])   HCAP (healthcare-associated pneumonia)    Plan: Continue current treatments. She is at risk of aspiration and that is being addressed. I don't think there is really anything else to add    LOS: 5 days   Maria Gallicchio L 01/10/2016, 10:06 AM

## 2016-01-10 NOTE — Progress Notes (Signed)
PROGRESS NOTE    [name] NormaShirley O [name]  ZOX:[id number] DOB: 03/07/1940 DOA: 01/05/2016 PCP: Ignatius Speckinghruv B Vyas, MD    Brief Narrative:  This is 75 year old female with a history of atrial fibrillation, COPD and chronic respiratory failure on home oxygen who presents to the hospital with 2 weeks of worsening shortness of breath. Chest x-ray indicated pneumonia and she was also found to have COPD exacerbation as well as rapid atrial fibrillation. She was placed on BiPAP and admitted to the stepdown unit for further treatment.  Assessment & Plan:   Active Problems:   Essential hypertension, benign   COPD with acute exacerbation (HCC)   Atrial fibrillation with RVR (HCC)   Chronic anticoagulation-CHADs VASc=6   Acute on chronic respiratory failure with hypoxia (HCC)   Uncontrolled type 2 diabetes mellitus with complication (HCC)   HCAP (healthcare-associated pneumonia)  1. Acute on chronic respiratory failure with hypoxia. Likely related to HCAP and COPD exacerbation. She is chronically on 2-3L at home.  - Pulmonology on Board and managing.  2. HCAP - Will narrow antibiotic regimen and place on doxycycline.   3. COPD exacerbation. Continue intravenous steroids. Continue bronchodilators and mucolytics. She is on antibiotics. She continues to wheeze. Add flutter valve  4. Uncontrolled diabetes. Started the patient on Lantus and resistant sliding-scale. Blood sugars have since improved  5. A. fib with RVR. CHADSVASc 6. She is anticoagulated with Eliquis.  Currently on cardizem 90 mg po q 6hr. Continue metoprolol. Cardiology assisting. HR fluctuating but has been mostly controlled on current regimen.  6. Hypertension. Blood pressures are currently stable.  DVT prophylaxis: eliquis Code Status: full Family Communication: discussed with grand daughter at the bedside Disposition Plan: discharge home once improved   Consultants:   Pulmonology  Procedures:     Antimicrobials:    Vancomycin 11/7>>  cefepime 11/7>>    Subjective: She reports her breathing is better.  Objective: Vitals:   01/10/16 0746 01/10/16 0800 01/10/16 0930 01/10/16 1140  BP:  (!) 151/95    Pulse:  68 89 89  Resp:  (!) 21 (!) 25 (!) 24  Temp:    97.2 F (36.2 C)  TempSrc:    Oral  SpO2: 94% 92% 95% 97%  Weight:      Height:        Intake/Output Summary (Last 24 hours) at 01/10/16 1350 Last data filed at 01/10/16 1212  Gross per 24 hour  Intake              190 ml  Output             2050 ml  Net            -1860 ml   Filed Weights   01/08/16 0500 01/09/16 0505 01/10/16 0500  Weight: 73.4 kg (161 lb 13.1 oz) 71 kg (156 lb 8.4 oz) 72.1 kg (158 lb 15.2 oz)    Examination:  General exam: Appears calm and comfortable, in nad. Respiratory system: expiratory wheeze BL mild. Respiratory effort normal. Equal chest rise Cardiovascular system: S1 & S2 heard, irregular. No JVD, murmurs, rubs, gallops or clicks. No pedal edema. Gastrointestinal system: Abdomen is nondistended, soft and nontender. No organomegaly or masses felt. Normal bowel sounds heard. Central nervous system: Alert and oriented. No focal neurological deficits. Extremities: Symmetric 5 x 5 power. Skin: No rashes, lesions or ulcers Psychiatry: Judgement and insight appear normal. Mood & affect appropriate.   Data Reviewed: I have personally reviewed following labs and imaging studies  CBC:  Recent Labs Lab 01/05/16 0551 01/06/16 0513 01/07/16 0555 01/09/16 0502  WBC 13.5* 9.5 9.3 9.1  NEUTROABS 11.2*  --   --   --   HGB 12.9 11.5* 11.8* 12.5  HCT 42.3 37.5 38.6 41.9  MCV 97.9 96.9 98.0 99.1  PLT 302 191 230 208   Basic Metabolic Panel:  Recent Labs Lab 01/05/16 0551 01/06/16 0513 01/07/16 0555 01/08/16 1346 01/09/16 2108 01/10/16 0912  NA 135 139 134* 135  --  133*  K 3.7 3.1* 3.8 3.0*  --  3.2*  CL 94* 96* 94* 88*  --  82*  CO2 32 37* 33* 37*  --  44*  GLUCOSE 399* 108* 334* 425* 392*  326*  BUN 29* 17 30* 31*  --  34*  CREATININE 0.93 0.78 0.98 1.01*  --  1.09*  CALCIUM 9.0 8.5* 8.6* 8.6*  --  8.7*   GFR: Estimated Creatinine Clearance: 42.1 mL/min (by C-G formula based on SCr of 1.09 mg/dL (H)). Liver Function Tests:  Recent Labs Lab 01/06/16 0513  AST 21  ALT 43  ALKPHOS 93  BILITOT 1.2  PROT 5.6*  ALBUMIN 3.1*   No results for input(s): LIPASE, AMYLASE in the last 168 hours. No results for input(s): AMMONIA in the last 168 hours. Coagulation Profile: No results for input(s): INR, PROTIME in the last 168 hours. Cardiac Enzymes: No results for input(s): CKTOTAL, CKMB, CKMBINDEX, TROPONINI in the last 168 hours. BNP (last 3 results) No results for input(s): PROBNP in the last 8760 hours. HbA1C: No results for input(s): HGBA1C in the last 72 hours. CBG:  Recent Labs Lab 01/08/16 2045 01/09/16 1717 01/09/16 2033 01/10/16 0739 01/10/16 1134  GLUCAP 282* 428* 411* 197* 309*   Lipid Profile: No results for input(s): CHOL, HDL, LDLCALC, TRIG, CHOLHDL, LDLDIRECT in the last 72 hours. Thyroid Function Tests: No results for input(s): TSH, T4TOTAL, FREET4, T3FREE, THYROIDAB in the last 72 hours. Anemia Panel: No results for input(s): VITAMINB12, FOLATE, FERRITIN, TIBC, IRON, RETICCTPCT in the last 72 hours. Sepsis Labs:  Recent Labs Lab 01/05/16 0604 01/06/16 1051  LATICACIDVEN 1.98* 2.1*    Recent Results (from the past 240 hour(s))  Culture, blood (Routine X 2) w Reflex to ID Panel     Status: None   Collection Time: 01/05/16  5:45 AM  Result Value Ref Range Status   Specimen Description BLOOD LEFT ANTECUBITAL  Final   Special Requests BOTTLES DRAWN AEROBIC AND ANAEROBIC 6CC EACH  Final   Culture NO GROWTH 5 DAYS  Final   Report Status 01/10/2016 FINAL  Final  Culture, blood (Routine X 2) w Reflex to ID Panel     Status: None   Collection Time: 01/05/16  5:51 AM  Result Value Ref Range Status   Specimen Description   Final    BLOOD BLOOD  RIGHT HAND AEROBIC RH BLOOD LEFT HAND ANAEROBIC LH   Special Requests BOTTLES DRAWN AEROBIC AND ANAEROBIC 6CC EACH  Final   Culture NO GROWTH 5 DAYS  Final   Report Status 01/10/2016 FINAL  Final  Urine culture     Status: None   Collection Time: 01/05/16  6:11 AM  Result Value Ref Range Status   Specimen Description URINE, CATHETERIZED  Final   Special Requests NONE  Final   Culture NO GROWTH Performed at [facility]   Final   Report Status 01/06/2016 FINAL  Final  MRSA PCR Screening     Status: None   Collection  Time: 01/05/16  8:57 AM  Result Value Ref Range Status   MRSA by PCR NEGATIVE NEGATIVE Final    Comment:        The GeneXpert MRSA Assay (FDA approved for NASAL specimens only), is one component of a comprehensive MRSA colonization surveillance program. It is not intended to diagnose MRSA infection nor to guide or monitor treatment for MRSA infections.   Culture, blood (routine x 2) Call MD if unable to obtain prior to antibiotics being given     Status: None   Collection Time: 01/05/16  9:54 AM  Result Value Ref Range Status   Specimen Description BLOOD RIGHT ANTECUBITAL  Final   Special Requests BOTTLES DRAWN AEROBIC AND ANAEROBIC 10 cc each  Final   Culture NO GROWTH 5 DAYS  Final   Report Status 01/10/2016 FINAL  Final  Culture, blood (routine x 2) Call MD if unable to obtain prior to antibiotics being given     Status: None   Collection Time: 01/05/16 10:03 AM  Result Value Ref Range Status   Specimen Description BLOOD BLOOD LEFT HAND  Final   Special Requests BOTTLES DRAWN AEROBIC AND ANAEROBIC 6 cc each  Final   Culture NO GROWTH 5 DAYS  Final   Report Status 01/10/2016 FINAL  Final  Culture, sputum-assessment     Status: None   Collection Time: 01/05/16  3:00 PM  Result Value Ref Range Status   Specimen Description EXPECTORATED SPUTUM  Final   Special Requests NONE  Final   Sputum evaluation   Final    THIS SPECIMEN IS ACCEPTABLE. RESPIRATORY  CULTURE REPORT TO FOLLOW. Performed at [facility]    Report Status 01/05/2016 FINAL  Final  Culture, respiratory (NON-Expectorated)     Status: None   Collection Time: 01/05/16  3:00 PM  Result Value Ref Range Status   Specimen Description EXPECTORATED SPUTUM  Final   Special Requests NONE  Final   Gram Stain   Final    ABUNDANT WBC PRESENT, PREDOMINANTLY PMN ABUNDANT GRAM POSITIVE COCCI IN PAIRS IN CLUSTERS MODERATE GRAM NEGATIVE RODS FEW GRAM NEGATIVE COCCOBACILLI FEW GRAM POSITIVE RODS FEW GRAM VARIABLE ROD FEW SQUAMOUS EPITHELIAL CELLS PRESENT FEW GRAM NEGATIVE DIPLOCOCCI    Culture   Final    Consistent with normal respiratory flora. Performed [facility]    Report Status 01/08/2016 FINAL  Final         Radiology Studies: Dg Chest Port 1 View  Result Date: 01/09/2016 CLINICAL DATA:  Patient with worsening shortness of breath. Follow-up chest radiograph. EXAM: PORTABLE CHEST 1 VIEW COMPARISON:  Chest radiograph 01/08/2016. FINDINGS: Monitoring leads overlie the patient. Stable enlarged cardiac and mediastinal contours with calcification of the thoracic aorta. Pulmonary vascular redistribution. Persistent small layering right pleural effusion with underlying pulmonary consolidation. No pneumothorax. IMPRESSION: Persistent small right pleural effusion and consolidative opacities within the right lower lobe which may represent pneumonia and/or atelectasis. Pulmonary vascular redistribution. Aortic atherosclerosis. Electronically Signed   By: Annia Beltrew  Davis M.D.   On: 01/09/2016 09:05        Scheduled Meds: . apixaban  5 mg Oral BID  . arformoterol  15 mcg Nebulization BID  . budesonide (PULMICORT) nebulizer solution  0.5 mg Nebulization BID  . diltiazem  90 mg Oral Q6H  . doxycycline  100 mg Oral Q12H  . furosemide  40 mg Intravenous Daily  . guaiFENesin  1,200 mg Oral BID  . insulin aspart  0-20 Units Subcutaneous TID WC  .  insulin aspart  0-5  Units Subcutaneous QHS  . insulin glargine  18 Units Subcutaneous BID  . ipratropium  0.5 mg Nebulization Q6H  . levalbuterol  0.63 mg Nebulization Q6H  . levothyroxine  88 mcg Oral QAC breakfast  . methylPREDNISolone (SOLU-MEDROL) injection  60 mg Intravenous Q6H  . metoprolol tartrate  25 mg Oral TID  . pantoprazole  40 mg Oral Daily  . rosuvastatin  10 mg Oral QHS  . sodium chloride flush  3 mL Intravenous Q12H   Continuous Infusions:   LOS: 5 days   Time spent: > 35 mins  Penny Pia, MD [facility] Pager [phone]  If 7PM-7AM, please contact night-coverage [email or web address] Password [email or web address] 01/10/2016, 1:50 PM

## 2016-01-11 ENCOUNTER — Inpatient Hospital Stay (HOSPITAL_COMMUNITY): Payer: Medicare Other

## 2016-01-11 DIAGNOSIS — R0600 Dyspnea, unspecified: Secondary | ICD-10-CM

## 2016-01-11 LAB — GLUCOSE, CAPILLARY
GLUCOSE-CAPILLARY: 172 mg/dL — AB (ref 65–99)
Glucose-Capillary: 194 mg/dL — ABNORMAL HIGH (ref 65–99)
Glucose-Capillary: 268 mg/dL — ABNORMAL HIGH (ref 65–99)
Glucose-Capillary: 357 mg/dL — ABNORMAL HIGH (ref 65–99)

## 2016-01-11 MED ORDER — METHYLPREDNISOLONE SODIUM SUCC 40 MG IJ SOLR
40.0000 mg | Freq: Two times a day (BID) | INTRAMUSCULAR | Status: DC
  Administered 2016-01-11 – 2016-01-12 (×2): 40 mg via INTRAVENOUS
  Filled 2016-01-11 (×2): qty 1

## 2016-01-11 MED ORDER — SPIRONOLACTONE 25 MG PO TABS
12.5000 mg | ORAL_TABLET | Freq: Every day | ORAL | Status: DC
  Administered 2016-01-11 – 2016-01-15 (×5): 12.5 mg via ORAL
  Filled 2016-01-11 (×4): qty 1

## 2016-01-11 MED ORDER — METOPROLOL TARTRATE 50 MG PO TABS
50.0000 mg | ORAL_TABLET | Freq: Two times a day (BID) | ORAL | Status: DC
  Administered 2016-01-11 – 2016-01-15 (×8): 50 mg via ORAL
  Filled 2016-01-11 (×9): qty 1

## 2016-01-11 MED ORDER — POTASSIUM CHLORIDE CRYS ER 20 MEQ PO TBCR
20.0000 meq | EXTENDED_RELEASE_TABLET | Freq: Once | ORAL | Status: AC
  Administered 2016-01-11: 20 meq via ORAL
  Filled 2016-01-11: qty 1

## 2016-01-11 NOTE — Progress Notes (Signed)
Patient Name: [name] [name] Date of Encounter: 01/11/2016  Primary Cardiologist: Nona DellMcDowell, Samuel MD  Hospital Problem List     Active Problems:   Essential hypertension, benign   COPD with acute exacerbation [facility](HCC)   Atrial fibrillation with RVR (HCC)   Chronic anticoagulation-CHADs VASc=6   Acute on chronic respiratory failure with hypoxia (HCC)   Uncontrolled type 2 diabetes mellitus with complication (HCC)   HCAP (healthcare-associated pneumonia)     Subjective   Feels weak but breathing better. Complaining of back pain.   Inpatient Medications    Scheduled Meds: . apixaban  5 mg Oral BID  . arformoterol  15 mcg Nebulization BID  . budesonide (PULMICORT) nebulizer solution  0.5 mg Nebulization BID  . diltiazem  90 mg Oral Q6H  . doxycycline  100 mg Oral Q12H  . furosemide  40 mg Intravenous Daily  . guaiFENesin  1,200 mg Oral BID  . insulin aspart  0-20 Units Subcutaneous TID WC  . insulin aspart  0-5 Units Subcutaneous QHS  . insulin glargine  18 Units Subcutaneous BID  . ipratropium  0.5 mg Nebulization Q6H  . levalbuterol  0.63 mg Nebulization Q6H  . levothyroxine  88 mcg Oral QAC breakfast  . methylPREDNISolone (SOLU-MEDROL) injection  40 mg Intravenous Q12H  . metoprolol tartrate  25 mg Oral TID  . pantoprazole  40 mg Oral Daily  . rosuvastatin  10 mg Oral QHS  . sodium chloride flush  3 mL Intravenous Q12H   Continuous Infusions:  PRN Meds: sodium chloride, ALPRAZolam, HYDROcodone-acetaminophen, levalbuterol, meclizine, sodium chloride flush   Vital Signs    Vitals:   01/11/16 0840 01/11/16 0845 01/11/16 0850 01/11/16 1117  BP:      Pulse:    (!) 123  Resp:    (!) 23  Temp:      TempSrc:      SpO2: 92% 92% 92% (!) 65%  Weight:      Height:        Intake/Output Summary (Last 24 hours) at 01/11/16 1133 Last data filed at 01/11/16 0800  Gross per 24 hour  Intake               10 ml  Output              300 ml  Net             -290 ml    Filed Weights   01/09/16 0505 01/10/16 0500 01/11/16 0500  Weight: 156 lb 8.4 oz (71 kg) 158 lb 15.2 oz (72.1 kg) 157 lb 13.6 oz (71.6 kg)    Physical Exam   GEN: Well nourished, well developed, in no acute distress. Frail HEENT: Grossly normal.  Neck: Supple, no JVD, carotid bruits, or masses. Cardiac: IRRR, tachycardic,1/6 systolic  murmur, no rubs, or gallops. No clubbing, cyanosis, edema.  Radials/DP/PT 2+ and equal bilaterally.  Respiratory:  Respirations regular and unlabored,scattered crackles, no wheezes. Wearing O2 liters. BiPAP at night.  GI: Soft, nontender, nondistended, BS + x 4. MS: no deformity or atrophy. Skin: warm and dry, no rash. Neuro:  Strength and sensation are intact. Psych: AAOx3.  Normal affect.  Labs    CBC  Recent Labs  01/09/16 0502  WBC 9.1  HGB 12.5  HCT 41.9  MCV 99.1  PLT 208   Basic Metabolic Panel  Recent Labs  01/08/16 1346 01/09/16 2108 01/10/16 0912  NA 135  --  133*  K 3.0*  --  3.2*  CL 88*  --  82*  CO2 37*  --  44*  GLUCOSE 425* 392* 326*  BUN 31*  --  34*  CREATININE 1.01*  --  1.09*  CALCIUM 8.6*  --  8.7*     Telemetry    Atrial fib with periods of atrial tachycardia.  Personally Reviewed  ECG     - Personally Reviewed  Radiology    No results found.  Cardiac Studies   None completed this admission. Will repeat echo due to breathing status.    Patient Profile     75 y/o female with chronic hypoxic respiratory failure, COPD, and diastolic CHF, with PAF on Eliquis, and CAD, admitted with COPD exacerbation and BiPAP dependent at night. Frequent hospitalizations for same.   Assessment & Plan    1. Atrial fib: Now on po diltiazem 90 mg Q 6 hours. Heart rate continues to have periods of rapid rhythm associated with increased work of breathing. BP has been slightly elevated. Continues on metoprolol 25 mg TID. May need to increase to 50 mg BID for better HR and BP control. No wheezes are noted.  Continue  apixaban. No bleeding issues.   2. Hypertension: Slightly elevated today. She is having improved breathing but continues to work at it. May be reflective in BP control..  3. Chronic Respiratory failure: Followed by Dr. Juanetta GoslingHawkins.    Signed, Joni ReiningKathryn Lawrence, NP  01/11/2016, 11:33 AM   Patient seen and examined I agree with findingas as noted above K Lawrence  I have amended above   Pt remains in Afib  Rates are better than last week but still high Breathing is improved  ON exam, Lungs with rhonchi, pops   Cardiac exam  Irreg irreg  No S3   Ext without edema  I would continue diltiazem  INcreased lppressor to 50 bid and follow    Continue Eliquis   Echo ordered to reeval LVEF    Dietrich PatesPaula Johntay Doolen

## 2016-01-11 NOTE — Progress Notes (Signed)
Inpatient Diabetes Program Recommendations  AACE/ADA: New Consensus Statement on Inpatient Glycemic Control (2015)  Target Ranges:  Prepandial:   less than 140 mg/dL      Peak postprandial:   less than 180 mg/dL (1-2 hours)      Critically ill patients:  140 - 180 mg/dL  Results for [name] [name], Creta O (MRN [id number]) as of 01/11/2016 10:29  Ref. Range 01/10/2016 07:39 01/10/2016 11:34 01/10/2016 15:50 01/10/2016 21:09 01/11/2016 07:55  Glucose-Capillary Latest Ref Range: 65 - 99 mg/dL 952197 (H) 841309 (H) 324366 (H) 377 (H) 194 (H)    Review of Glycemic Control  Diabetes history: DM2 Outpatient Diabetes medications: Glipizide 10 mg BID, Lantus 15 units BID Current orders for Inpatient glycemic control: Lantus 18 units BID, Novlog 0-20 units TID with meals, Novolog 0-5 units QHS  Inpatient Diabetes Program Recommendations: Insulin - Meal Coverage: If steroids are continued, please consider ordering Novolog 5 units TID with meals for meal coverage if patient eats at least 50% of meals.  Thanks, Orlando PennerMarie Lakeem Rozo, RN, MSN, CDE Diabetes Coordinator Inpatient Diabetes Program [phone] (Team Pager from 8am to 5pm)

## 2016-01-11 NOTE — Progress Notes (Signed)
PROGRESS NOTE    [name] [name]  VWU:[id number] DOB: 02/24/1941 DOA: 01/05/2016 PCP: Ignatius Speckinghruv B Vyas, MD    Brief Narrative:  This is 75 year old female with a history of atrial fibrillation, COPD and chronic respiratory failure on home oxygen who presents to the hospital with 2 weeks of worsening shortness of breath. Chest x-ray indicated pneumonia and she was also found to have COPD exacerbation as well as rapid atrial fibrillation. She was placed on BiPAP and admitted to the stepdown unit for further treatment.  Assessment & Plan:   Active Problems:   Essential hypertension, benign   COPD with acute exacerbation (HCC)   Atrial fibrillation with RVR (HCC)   Chronic anticoagulation-CHADs VASc=6   Acute on chronic respiratory failure with hypoxia (HCC)   Uncontrolled type 2 diabetes mellitus with complication (HCC)   HCAP (healthcare-associated pneumonia)  1. Acute on chronic respiratory failure with hypoxia. Likely related to HCAP and COPD exacerbation. She is chronically on 2-3L at home.  - Pulmonology on Board and managing.  2. HCAP - Will narrow antibiotic regimen and place on doxycycline.   3. COPD exacerbation. Continue intravenous steroids. Continue bronchodilators and mucolytics. She is on antibiotics. She continues to wheeze. Add flutter valve  4. Uncontrolled diabetes. Started the patient on Lantus and resistant sliding-scale. Blood sugars have since improved  5. A. fib with RVR. CHADSVASc 6. She is anticoagulated with Eliquis.  Currently on cardizem 90 mg po q 6hr. Continue metoprolol. Cardiology managing. HR fluctuating but has been mostly controlled on current regimen. Cards considering altering rate controlling medication regimen.  6. Hypertension. Blood pressures are currently stable.  7. Hypokalemia - replace orally and reassess - bmp ordered for next am.  DVT prophylaxis: eliquis Code Status: full Family Communication: discussed with grand daughter at the  bedside Disposition Plan: discharge home once improved   Consultants:   Pulmonology  Procedures:     Antimicrobials:   Vancomycin 11/7>>  cefepime 11/7>>    Subjective: She continues to reports her breathing is better.  Objective: Vitals:   01/11/16 0840 01/11/16 0845 01/11/16 0850 01/11/16 1117  BP:      Pulse:    (!) 123  Resp:    (!) 23  Temp:      TempSrc:      SpO2: 92% 92% 92% (!) 65%  Weight:      Height:        Intake/Output Summary (Last 24 hours) at 01/11/16 1210 Last data filed at 01/11/16 0800  Gross per 24 hour  Intake               10 ml  Output              300 ml  Net             -290 ml   Filed Weights   01/09/16 0505 01/10/16 0500 01/11/16 0500  Weight: 71 kg (156 lb 8.4 oz) 72.1 kg (158 lb 15.2 oz) 71.6 kg (157 lb 13.6 oz)    Examination:  General exam: Appears calm and comfortable, in nad. Respiratory system: no wheezes. Respiratory effort normal. Equal chest rise Cardiovascular system: S1 & S2 heard, irregular. No JVD, murmurs, rubs, gallops or clicks. No pedal edema. Gastrointestinal system: Abdomen is nondistended, soft and nontender. No organomegaly or masses felt. Normal bowel sounds heard. Central nervous system: Alert and oriented. No focal neurological deficits. Extremities: Symmetric 5 x 5 power. Skin: No rashes, lesions or ulcers Psychiatry: Judgement and insight  appear normal. Mood & affect appropriate.   Data Reviewed: I have personally reviewed following labs and imaging studies  CBC:  Recent Labs Lab 01/05/16 0551 01/06/16 0513 01/07/16 0555 01/09/16 0502  WBC 13.5* 9.5 9.3 9.1  NEUTROABS 11.2*  --   --   --   HGB 12.9 11.5* 11.8* 12.5  HCT 42.3 37.5 38.6 41.9  MCV 97.9 96.9 98.0 99.1  PLT 302 191 230 208   Basic Metabolic Panel:  Recent Labs Lab 01/05/16 0551 01/06/16 0513 01/07/16 0555 01/08/16 1346 01/09/16 2108 01/10/16 0912  NA 135 139 134* 135  --  133*  K 3.7 3.1* 3.8 3.0*  --  3.2*  CL 94*  96* 94* 88*  --  82*  CO2 32 37* 33* 37*  --  44*  GLUCOSE 399* 108* 334* 425* 392* 326*  BUN 29* 17 30* 31*  --  34*  CREATININE 0.93 0.78 0.98 1.01*  --  1.09*  CALCIUM 9.0 8.5* 8.6* 8.6*  --  8.7*   GFR: Estimated Creatinine Clearance: 42 mL/min (by C-G formula based on SCr of 1.09 mg/dL (H)). Liver Function Tests:  Recent Labs Lab 01/06/16 0513  AST 21  ALT 43  ALKPHOS 93  BILITOT 1.2  PROT 5.6*  ALBUMIN 3.1*   No results for input(s): LIPASE, AMYLASE in the last 168 hours. No results for input(s): AMMONIA in the last 168 hours. Coagulation Profile: No results for input(s): INR, PROTIME in the last 168 hours. Cardiac Enzymes: No results for input(s): CKTOTAL, CKMB, CKMBINDEX, TROPONINI in the last 168 hours. BNP (last 3 results) No results for input(s): PROBNP in the last 8760 hours. HbA1C: No results for input(s): HGBA1C in the last 72 hours. CBG:  Recent Labs Lab 01/10/16 1134 01/10/16 1550 01/10/16 2109 01/11/16 0755 01/11/16 1154  GLUCAP 309* 366* 377* 194* 172*   Lipid Profile: No results for input(s): CHOL, HDL, LDLCALC, TRIG, CHOLHDL, LDLDIRECT in the last 72 hours. Thyroid Function Tests: No results for input(s): TSH, T4TOTAL, FREET4, T3FREE, THYROIDAB in the last 72 hours. Anemia Panel: No results for input(s): VITAMINB12, FOLATE, FERRITIN, TIBC, IRON, RETICCTPCT in the last 72 hours. Sepsis Labs:  Recent Labs Lab 01/05/16 0604 01/06/16 1051  LATICACIDVEN 1.98* 2.1*    Recent Results (from the past 240 hour(s))  Culture, blood (Routine X 2) w Reflex to ID Panel     Status: None   Collection Time: 01/05/16  5:45 AM  Result Value Ref Range Status   Specimen Description BLOOD LEFT ANTECUBITAL  Final   Special Requests BOTTLES DRAWN AEROBIC AND ANAEROBIC 6CC EACH  Final   Culture NO GROWTH 5 DAYS  Final   Report Status 01/10/2016 FINAL  Final  Culture, blood (Routine X 2) w Reflex to ID Panel     Status: None   Collection Time: 01/05/16  5:51  AM  Result Value Ref Range Status   Specimen Description   Final    BLOOD BLOOD RIGHT HAND AEROBIC RH BLOOD LEFT HAND ANAEROBIC LH   Special Requests BOTTLES DRAWN AEROBIC AND ANAEROBIC 6CC EACH  Final   Culture NO GROWTH 5 DAYS  Final   Report Status 01/10/2016 FINAL  Final  Urine culture     Status: None   Collection Time: 01/05/16  6:11 AM  Result Value Ref Range Status   Specimen Description URINE, CATHETERIZED  Final   Special Requests NONE  Final   Culture NO GROWTH [facility]   Final  Report Status 01/06/2016 FINAL  Final  MRSA PCR Screening     Status: None   Collection Time: 01/05/16  8:57 AM  Result Value Ref Range Status   MRSA by PCR NEGATIVE NEGATIVE Final    Comment:        The GeneXpert MRSA Assay (FDA approved for NASAL specimens only), is one component of a comprehensive MRSA colonization surveillance program. It is not intended to diagnose MRSA infection nor to guide or monitor treatment for MRSA infections.   Culture, blood (routine x 2) Call MD if unable to obtain prior to antibiotics being given     Status: None   Collection Time: 01/05/16  9:54 AM  Result Value Ref Range Status   Specimen Description BLOOD RIGHT ANTECUBITAL  Final   Special Requests BOTTLES DRAWN AEROBIC AND ANAEROBIC 10 cc each  Final   Culture NO GROWTH 5 DAYS  Final   Report Status 01/10/2016 FINAL  Final  Culture, blood (routine x 2) Call MD if unable to obtain prior to antibiotics being given     Status: None   Collection Time: 01/05/16 10:03 AM  Result Value Ref Range Status   Specimen Description BLOOD BLOOD LEFT HAND  Final   Special Requests BOTTLES DRAWN AEROBIC AND ANAEROBIC 6 cc each  Final   Culture NO GROWTH 5 DAYS  Final   Report Status 01/10/2016 FINAL  Final  Culture, sputum-assessment     Status: None   Collection Time: 01/05/16  3:00 PM  Result Value Ref Range Status   Specimen Description EXPECTORATED SPUTUM  Final   Special Requests  NONE  Final   Sputum evaluation   Final    THIS SPECIMEN IS ACCEPTABLE. RESPIRATORY CULTURE REPORT TO FOLLOW. Performed at [facility]    Report Status 01/05/2016 FINAL  Final  Culture, respiratory (NON-Expectorated)     Status: None   Collection Time: 01/05/16  3:00 PM  Result Value Ref Range Status   Specimen Description EXPECTORATED SPUTUM  Final   Special Requests NONE  Final   Gram Stain   Final    ABUNDANT WBC PRESENT, PREDOMINANTLY PMN ABUNDANT GRAM POSITIVE COCCI IN PAIRS IN CLUSTERS MODERATE GRAM NEGATIVE RODS FEW GRAM NEGATIVE COCCOBACILLI FEW GRAM POSITIVE RODS FEW GRAM VARIABLE ROD FEW SQUAMOUS EPITHELIAL CELLS PRESENT FEW GRAM NEGATIVE DIPLOCOCCI    Culture   Final    Consistent with normal respiratory flora. Performed at [facility]    Report Status 01/08/2016 FINAL  Final         Radiology Studies: No results found.      Scheduled Meds: . apixaban  5 mg Oral BID  . arformoterol  15 mcg Nebulization BID  . budesonide (PULMICORT) nebulizer solution  0.5 mg Nebulization BID  . diltiazem  90 mg Oral Q6H  . doxycycline  100 mg Oral Q12H  . furosemide  40 mg Intravenous Daily  . guaiFENesin  1,200 mg Oral BID  . insulin aspart  0-20 Units Subcutaneous TID WC  . insulin aspart  0-5 Units Subcutaneous QHS  . insulin glargine  18 Units Subcutaneous BID  . ipratropium  0.5 mg Nebulization Q6H  . levalbuterol  0.63 mg Nebulization Q6H  . levothyroxine  88 mcg Oral QAC breakfast  . methylPREDNISolone (SOLU-MEDROL) injection  40 mg Intravenous Q12H  . metoprolol tartrate  50 mg Oral BID  . pantoprazole  40 mg Oral Daily  . rosuvastatin  10 mg Oral QHS  . sodium chloride flush  3 mL Intravenous Q12H  . spironolactone  12.5 mg Oral Daily   Continuous Infusions:   LOS: 6 days   Time spent: > 35 mins  Penny Pia, MD [facility] Pager [phone]  If 7PM-7AM, please contact night-coverage [email or web address] Password  [email or web address] 01/11/2016, 12:10 PM

## 2016-01-11 NOTE — Progress Notes (Signed)
Subjective: I think she is overall about the same. She is on BiPAP now but comes off after being on it overnight. She was able to sit up yesterday. She is still very weak. No other new complaints. No nausea vomiting diarrhea chest pain. She is coughing up sputum.  Objective: Vital signs in last 24 hours: Temp:  [96.3 F (35.7 C)-97.4 F (36.3 C)] 97 F (36.1 C) (11/13 0400) Pulse Rate:  [68-130] 99 (11/13 0218) Resp:  [15-34] 15 (11/13 0218) BP: (131-173)/(60-112) 143/89 (11/13 0513) SpO2:  [92 %-98 %] 97 % (11/13 0218) FiO2 (%):  [45 %-55 %] 55 % (11/13 0217) Weight:  [71.6 kg (157 lb 13.6 oz)] 71.6 kg (157 lb 13.6 oz) (11/13 0500) Weight change: -0.5 kg (-1 lb 1.6 oz) Last BM Date: 01/08/16  Intake/Output from previous day: 11/12 0701 - 11/13 0700 In: 10 [P.O.:10] Out: 600 [Urine:600]  PHYSICAL EXAM General appearance: alert, mild distress and On BiPAP Resp: Her chest is clear today Cardio: irregularly irregular rhythm GI: soft, non-tender; bowel sounds normal; no masses,  no organomegaly Extremities: Trace edema Skin warm and dry. Mucous membranes are moist  Lab Results:  Results for orders placed or performed during the hospital encounter of 01/05/16 (from the past 48 hour(s))  Glucose, capillary     Status: Abnormal   Collection Time: 01/09/16  5:17 PM  Result Value Ref Range   Glucose-Capillary 428 (H) 65 - 99 mg/dL  Glucose, capillary     Status: Abnormal   Collection Time: 01/09/16  8:33 PM  Result Value Ref Range   Glucose-Capillary 411 (H) 65 - 99 mg/dL   Comment 1 Notify RN   Glucose, random     Status: Abnormal   Collection Time: 01/09/16  9:08 PM  Result Value Ref Range   Glucose, Bld 392 (H) 65 - 99 mg/dL  Glucose, capillary     Status: Abnormal   Collection Time: 01/10/16  7:39 AM  Result Value Ref Range   Glucose-Capillary 197 (H) 65 - 99 mg/dL  Basic metabolic panel     Status: Abnormal   Collection Time: 01/10/16  9:12 AM  Result Value Ref Range    Sodium 133 (L) 135 - 145 mmol/L   Potassium 3.2 (L) 3.5 - 5.1 mmol/L   Chloride 82 (L) 101 - 111 mmol/L   CO2 44 (H) 22 - 32 mmol/L   Glucose, Bld 326 (H) 65 - 99 mg/dL   BUN 34 (H) 6 - 20 mg/dL   Creatinine, Ser 1.09 (H) 0.44 - 1.00 mg/dL   Calcium 8.7 (L) 8.9 - 10.3 mg/dL   GFR calc non Af Amer 49 (L) >60 mL/min   GFR calc Af Amer 57 (L) >60 mL/min    Comment: (NOTE) The eGFR has been calculated using the CKD EPI equation. This calculation has not been validated in all clinical situations. eGFR's persistently <60 mL/min signify possible Chronic Kidney Disease.    Anion gap 7 5 - 15  Glucose, capillary     Status: Abnormal   Collection Time: 01/10/16 11:34 AM  Result Value Ref Range   Glucose-Capillary 309 (H) 65 - 99 mg/dL   Comment 1 Notify RN    Comment 2 Document in Chart   Glucose, capillary     Status: Abnormal   Collection Time: 01/10/16  3:50 PM  Result Value Ref Range   Glucose-Capillary 366 (H) 65 - 99 mg/dL   Comment 1 Notify RN    Comment 2 Document in  Chart   Glucose, capillary     Status: Abnormal   Collection Time: 01/10/16  9:09 PM  Result Value Ref Range   Glucose-Capillary 377 (H) 65 - 99 mg/dL   Comment 1 Notify RN    Comment 2 Document in Chart     ABGS No results for input(s): PHART, PO2ART, TCO2, HCO3 in the last 72 hours.  Invalid input(s): PCO2 CULTURES Recent Results (from the past 240 hour(s))  Culture, blood (Routine X 2) w Reflex to ID Panel     Status: None   Collection Time: 01/05/16  5:45 AM  Result Value Ref Range Status   Specimen Description BLOOD LEFT ANTECUBITAL  Final   Special Requests BOTTLES DRAWN AEROBIC AND ANAEROBIC [facility]  Final   Culture NO GROWTH 5 DAYS  Final   Report Status 01/10/2016 FINAL  Final  Culture, blood (Routine X 2) w Reflex to ID Panel     Status: None   Collection Time: 01/05/16  5:51 AM  Result Value Ref Range Status   Specimen Description   Final    BLOOD BLOOD RIGHT HAND AEROBIC RH BLOOD LEFT  HAND ANAEROBIC LH   Special Requests BOTTLES DRAWN AEROBIC AND ANAEROBIC [place]  Final   Culture NO GROWTH 5 DAYS  Final   Report Status 01/10/2016 FINAL  Final  Urine culture     Status: None   Collection Time: 01/05/16  6:11 AM  Result Value Ref Range Status   Specimen Description URINE, CATHETERIZED  Final   Special Requests NONE  Final   Culture NO GROWTH Performed at [facility]   Final   Report Status 01/06/2016 FINAL  Final  MRSA PCR Screening     Status: None   Collection Time: 01/05/16  8:57 AM  Result Value Ref Range Status   MRSA by PCR NEGATIVE NEGATIVE Final    Comment:        The GeneXpert MRSA Assay (FDA approved for NASAL specimens only), is one component of a comprehensive MRSA colonization surveillance program. It is not intended to diagnose MRSA infection nor to guide or monitor treatment for MRSA infections.   Culture, blood (routine x 2) Call MD if unable to obtain prior to antibiotics being given     Status: None   Collection Time: 01/05/16  9:54 AM  Result Value Ref Range Status   Specimen Description BLOOD RIGHT ANTECUBITAL  Final   Special Requests BOTTLES DRAWN AEROBIC AND ANAEROBIC 10 cc each  Final   Culture NO GROWTH 5 DAYS  Final   Report Status 01/10/2016 FINAL  Final  Culture, blood (routine x 2) Call MD if unable to obtain prior to antibiotics being given     Status: None   Collection Time: 01/05/16 10:03 AM  Result Value Ref Range Status   Specimen Description BLOOD BLOOD LEFT HAND  Final   Special Requests BOTTLES DRAWN AEROBIC AND ANAEROBIC 6 cc each  Final   Culture NO GROWTH 5 DAYS  Final   Report Status 01/10/2016 FINAL  Final  Culture, sputum-assessment     Status: None   Collection Time: 01/05/16  3:00 PM  Result Value Ref Range Status   Specimen Description EXPECTORATED SPUTUM  Final   Special Requests NONE  Final   Sputum evaluation   Final    THIS SPECIMEN IS ACCEPTABLE. RESPIRATORY CULTURE REPORT TO  FOLLOW. Performed at [facility]    Report Status 01/05/2016 FINAL  Final  Culture, respiratory (NON-Expectorated)  Status: None   Collection Time: 01/05/16  3:00 PM  Result Value Ref Range Status   Specimen Description EXPECTORATED SPUTUM  Final   Special Requests NONE  Final   Gram Stain   Final    ABUNDANT WBC PRESENT, PREDOMINANTLY PMN ABUNDANT GRAM POSITIVE COCCI IN PAIRS IN CLUSTERS MODERATE GRAM NEGATIVE RODS FEW GRAM NEGATIVE COCCOBACILLI FEW GRAM POSITIVE RODS FEW GRAM VARIABLE ROD FEW SQUAMOUS EPITHELIAL CELLS PRESENT FEW GRAM NEGATIVE DIPLOCOCCI    Culture   Final    Consistent with normal respiratory flora. Performed at [facility]    Report Status 01/08/2016 FINAL  Final   Studies/Results: Dg Chest Port 1 View  Result Date: 01/09/2016 CLINICAL DATA:  Patient with worsening shortness of breath. Follow-up chest radiograph. EXAM: PORTABLE CHEST 1 VIEW COMPARISON:  Chest radiograph 01/08/2016. FINDINGS: Monitoring leads overlie the patient. Stable enlarged cardiac and mediastinal contours with calcification of the thoracic aorta. Pulmonary vascular redistribution. Persistent small layering right pleural effusion with underlying pulmonary consolidation. No pneumothorax. IMPRESSION: Persistent small right pleural effusion and consolidative opacities within the right lower lobe which may represent pneumonia and/or atelectasis. Pulmonary vascular redistribution. Aortic atherosclerosis. Electronically Signed   By: Lovey Newcomer M.D.   On: 01/09/2016 09:05    Medications:  Prior to Admission:  Prescriptions Prior to Admission  Medication Sig Dispense Refill Last Dose  . ALPRAZolam (XANAX) 0.5 MG tablet Take 0.5 mg by mouth 3 (three) times daily as needed for anxiety.   01/04/2016 at Unknown time  . dexlansoprazole (DEXILANT) 60 MG capsule Take 60 mg by mouth daily.     01/04/2016 at Unknown time  . diltiazem (CARDIZEM CD) 360 MG 24 hr capsule Take 1 capsule  (360 mg total) by mouth daily. 30 capsule 0 01/04/2016 at Unknown time  . ELIQUIS 5 MG TABS tablet TAKE 1 TABLET BY MOUTH TWICE DAILY 60 tablet 5 01/04/2016 at 1700  . Fluticasone Furoate-Vilanterol (BREO ELLIPTA) 100-25 MCG/INH AEPB Inhale 1 puff into the lungs daily as needed (wheezing/shortness of breath).    unknown  . furosemide (LASIX) 40 MG tablet Take 1 tablet (40 mg total) by mouth daily. 30 tablet 1 01/04/2016 at Unknown time  . glipiZIDE (GLUCOTROL) 10 MG tablet Take 1 tablet (10 mg total) by mouth 2 (two) times daily before a meal. Do not take glipizide if blood sugar falls below 125.   01/04/2016 at Unknown time  . HYDROcodone-acetaminophen (NORCO/VICODIN) 5-325 MG tablet Take 1 tablet by mouth 2 (two) times daily as needed for moderate pain.   unknown  . insulin glargine (LANTUS) 100 UNIT/ML injection Inject 0.15 mLs (15 Units total) into the skin 2 (two) times daily. 10 mL 11 01/04/2016 at Unknown time  . Insulin Syringes, Disposable, U-100 1 ML MISC 15 Units by Does not apply route 2 (two) times daily. 200 each 6 01/04/2016 at Unknown time  . levalbuterol (XOPENEX) 0.63 MG/3ML nebulizer solution Take 3 mLs (0.63 mg total) by nebulization every 3 (three) hours as needed for wheezing or shortness of breath. 3 mL 12 01/04/2016 at Unknown time  . levothyroxine (SYNTHROID, LEVOTHROID) 88 MCG tablet Take 88 mcg by mouth daily before breakfast.   01/04/2016 at Unknown time  . meclizine (ANTIVERT) 25 MG tablet Take 12.5 mg by mouth daily as needed for dizziness.   unknown  . metoprolol tartrate (LOPRESSOR) 25 MG tablet Take 25 mg by mouth 2 (two) times daily.   01/04/2016 at 1700  . nitroGLYCERIN (NITROSTAT) 0.4 MG SL tablet  Place 1 tablet (0.4 mg total) under the tongue every 5 (five) minutes x 3 doses as needed for chest pain. For severe chest pain 25 tablet 3 unknown  . OXYGEN Inhale 2-3 L into the lungs continuous.   01/04/2016 at Unknown time  . rosuvastatin (CRESTOR) 10 MG tablet Take 1 tablet (10  mg total) by mouth daily. (Patient taking differently: Take 10 mg by mouth at bedtime. ) 30 tablet 6 01/04/2016 at Unknown time  . Vitamin D, Ergocalciferol, (DRISDOL) 50000 units CAPS capsule Take 50,000 Units by mouth every 7 (seven) days. Takes on Mondays.   Past Week at Unknown time  . [DISCONTINUED] levofloxacin (LEVAQUIN) 500 MG tablet Take 1 tablet (500 mg total) by mouth daily. Antibiotic. Starting 12/12/15 take 1 tablet daily until completed. 4 tablet 0   . [DISCONTINUED] predniSONE (DELTASONE) 10 MG tablet Starting 12/12/15, take 6 tablets daily for 1 day; then 5 tablets the next day for 1 day; then 4 tablets the next day for 1 day; then 3 tablets the next day for 1 day; then 2 tablets the next day for 1 day; then 1 tablet the next day for 1 day; then stop. 21 tablet 0    Scheduled: . apixaban  5 mg Oral BID  . arformoterol  15 mcg Nebulization BID  . budesonide (PULMICORT) nebulizer solution  0.5 mg Nebulization BID  . diltiazem  90 mg Oral Q6H  . doxycycline  100 mg Oral Q12H  . furosemide  40 mg Intravenous Daily  . guaiFENesin  1,200 mg Oral BID  . insulin aspart  0-20 Units Subcutaneous TID WC  . insulin aspart  0-5 Units Subcutaneous QHS  . insulin glargine  18 Units Subcutaneous BID  . ipratropium  0.5 mg Nebulization Q6H  . levalbuterol  0.63 mg Nebulization Q6H  . levothyroxine  88 mcg Oral QAC breakfast  . methylPREDNISolone (SOLU-MEDROL) injection  60 mg Intravenous Q6H  . metoprolol tartrate  25 mg Oral TID  . pantoprazole  40 mg Oral Daily  . rosuvastatin  10 mg Oral QHS  . sodium chloride flush  3 mL Intravenous Q12H   Continuous:  SJG:GEZMOQ chloride, ALPRAZolam, HYDROcodone-acetaminophen, levalbuterol, meclizine, sodium chloride flush  Assesment: She was admitted with COPD exacerbation, acute on chronic hypoxic respiratory failure and healthcare associated pneumonia. She is being treated for all that and is slowly improving. She has diabetes and her blood sugars  still elevated I think at least partially related to steroids. She had atrial fibrillation with RVR and that's better but she still gets her heart rate up into the 110-120 range occasionally. She is very weak. I think she has some element of heart failure as well. Active Problems:   Essential hypertension, benign   COPD with acute exacerbation (HCC)   Atrial fibrillation with RVR (HCC)   Chronic anticoagulation-CHADs VASc=6   Acute on chronic respiratory failure with hypoxia ([place])   Uncontrolled type 2 diabetes mellitus with complication ([place])   HCAP (healthcare-associated pneumonia)    Plan: No change in treatments. Continue antibiotics. Cut steroid dose. I've taken the liberty of ordering physical therapy evaluation    LOS: 6 days   Joby Hershkowitz L 01/11/2016, 7:15 AM

## 2016-01-11 NOTE — Evaluation (Signed)
Physical Therapy Evaluation Patient Details Name: [name] NormaShirley O Coombes MRN: [id number] DOB: 02/17/1941 Today's Date: 01/11/2016   History of Present Illness  75 year old female with a history of atrial fibrillation, COPD and chronic respiratory failure on home oxygen who presents to the hospital with 2 weeks of worsening shortness of breath. Chest x-ray indicated pneumonia and she was also found to have COPD exacerbation as well as rapid atrial fibrillation. She was placed on BiPAP and admitted to the stepdown unit for further treatment.  Clinical Impression  Pt received sitting up in the chair, and was agreeable to PT evaluation.  Pt expressed that she has been living with her son, and she is independent with ambulation, dressing and bathing.  She wears 2-3 L of O2 at home.  During PT evaluation today she ambulated 2040ft with Min guard and RW.  Her HR ranged from 88bpm - 127bpm during ambulation.  She is recommended for HHPT upon d/c, and will likely need to use a RW to build her endurance.      Follow Up Recommendations Home health PT;Supervision - Intermittent    Equipment Recommendations  None recommended by PT    Recommendations for Other Services       Precautions / Restrictions Precautions Precautions: None Restrictions Weight Bearing Restrictions: No      Mobility  Bed Mobility Overal bed mobility:  (Not observed)                Transfers Overall transfer level: Needs assistance Equipment used: Rolling walker (2 wheeled) Transfers: Sit to/from Stand Sit to Stand: Min guard (vc's and tc's for hand placement. )            Ambulation/Gait Ambulation/Gait assistance: Min assist Ambulation Distance (Feet): 40 Feet Assistive device: Rolling walker (2 wheeled) Gait Pattern/deviations: Step-through pattern   Gait velocity interpretation: <1.8 ft/sec, indicative of risk for recurrent falls General Gait Details: Pt demonstrates slow cadence.  Pt's HR ranges from 88bpm -  127bpm.  Pt required assistance to turn RW.   BORG: 10/10 after ambulation Stairs            Wheelchair Mobility    Modified Rankin (Stroke Patients Only)       Balance Overall balance assessment: Needs assistance         Standing balance support: Bilateral upper extremity supported Standing balance-Leahy Scale: Fair                               Pertinent Vitals/Pain Pain Assessment: No/denies pain    Home Living   Living Arrangements: Children (son - who is there most of the time. )   Type of Home: House Home Access: Stairs to enter   [employer] CorporationEntrance Stairs-Number of Steps: 1 Home Layout: One level Home Equipment: Grab bars - tub/shower;Walker - 2 wheels;Cane - single point;Shower seat (home O2 on 2-3L)      Prior Function     Gait / Transfers Assistance Needed: independent   ADL's / Homemaking Assistance Needed: independent with dressing and bathing.  Pt states she is still driving.          Hand Dominance   Dominant Hand: Right    Extremity/Trunk Assessment   Upper Extremity Assessment: Generalized weakness           Lower Extremity Assessment: Generalized weakness         Communication   Communication: No difficulties  Cognition Arousal/Alertness: Awake/alert Behavior During Therapy:  WFL for tasks assessed/performed Overall Cognitive Status: Within Functional Limits for tasks assessed                      General Comments      Exercises     Assessment/Plan    PT Assessment Patient needs continued PT services  PT Problem List Decreased strength;Decreased activity tolerance;Decreased balance;Decreased mobility;Decreased knowledge of use of DME;Decreased safety awareness;Decreased knowledge of precautions;Cardiopulmonary status limiting activity          PT Treatment Interventions DME instruction;Gait training;Functional mobility training;Therapeutic activities;Therapeutic exercise;Balance training;Patient/family  education    PT Goals (Current goals can be found in the Care Plan section)  Acute Rehab PT Goals Patient Stated Goal: To go home.  PT Goal Formulation: With patient Time For Goal Achievement: 01/18/16 Potential to Achieve Goals: Good    Frequency Min 3X/week   Barriers to discharge        Co-evaluation               End of Session Equipment Utilized During Treatment: Gait belt;Oxygen Activity Tolerance: Patient limited by fatigue Patient left: in chair;with call bell/phone within reach Nurse Communication: Mobility status Leotis Shames(Lauren, RN notified of pt's mobility.  )    Functional Assessment Tool Used: [employer] AM-PAC "6-clicks"  Functional Limitation: Mobility: Walking and moving around Mobility: Walking and Moving Around Current Status [phone]([id number]): At least 40 percent but less than 60 percent impaired, limited or restricted Mobility: Walking and Moving Around Goal Status [phone]): At least 20 percent but less than 40 percent impaired, limited or restricted    Time: 1352-1421 PT Time Calculation (min) (ACUTE ONLY): 29 min   Charges:   PT Evaluation $PT Eval Moderate Complexity: 1 Procedure PT Treatments $Gait Training: 8-22 mins   PT G Codes:   PT G-Codes **NOT FOR INPATIENT CLASS** Functional Assessment Tool Used: [employer] AM-PAC "6-clicks"  Functional Limitation: Mobility: Walking and moving around Mobility: Walking and Moving Around Current Status [phone]([id number]): At least 40 percent but less than 60 percent impaired, limited or restricted Mobility: Walking and Moving Around Goal Status [phone]([phone]): At least 20 percent but less than 40 percent impaired, limited or restricted    Beth Daphine Loch, PT, DPT X: [phone]

## 2016-01-11 NOTE — Progress Notes (Signed)
*  PRELIMINARY RESULTS* Echocardiogram 2D Echocardiogram has been performed.  Stacey DrainWhite, Ayeshia Coppin J 01/11/2016, 4:25 PM

## 2016-01-11 NOTE — Progress Notes (Signed)
SLP Cancellation Note  Patient Details Name: Jeronimo NormaShirley O Mcconico MRN: [id number] DOB: 06/21/1940   Cancelled treatment:       Reason Eval/Treat Not Completed: Patient at procedure or test/unavailable ; Pt undergoing procedure (echocardiogram) and SLP unable to see pt at this time for follow up for dysphagia evaluation last week. Current chest x-ray shows: Persistent small right pleural effusion and consolidative opacities within the right lower lobe which may represent pneumonia and/or atelectasis. Pt continues with Bi-PAP at night and is still very weak per staff. SLP will attempt to see for diet tolerance later today or tomorrow.  Thank you,  Havery MorosDabney Chandria Rookstool, CCC-SLP [phone]   Malay Fantroy 01/11/2016, 3:57 PM

## 2016-01-12 LAB — CBC
HEMATOCRIT: 39 % (ref 36.0–46.0)
HEMOGLOBIN: 11.3 g/dL — AB (ref 12.0–15.0)
MCH: 29.4 pg (ref 26.0–34.0)
MCHC: 29 g/dL — ABNORMAL LOW (ref 30.0–36.0)
MCV: 101.6 fL — AB (ref 78.0–100.0)
Platelets: 150 10*3/uL (ref 150–400)
RBC: 3.84 MIL/uL — AB (ref 3.87–5.11)
RDW: 14.6 % (ref 11.5–15.5)
WBC: 9.8 10*3/uL (ref 4.0–10.5)

## 2016-01-12 LAB — GLUCOSE, CAPILLARY
GLUCOSE-CAPILLARY: 541 mg/dL — AB (ref 65–99)
GLUCOSE-CAPILLARY: 310 mg/dL — AB (ref 65–99)
GLUCOSE-CAPILLARY: 292 mg/dL — AB (ref 65–99)
Glucose-Capillary: 165 mg/dL — ABNORMAL HIGH (ref 65–99)
Glucose-Capillary: 527 mg/dL (ref 65–99)
Glucose-Capillary: 421 mg/dL — ABNORMAL HIGH (ref 65–99)
Glucose-Capillary: 340 mg/dL — ABNORMAL HIGH (ref 65–99)

## 2016-01-12 LAB — ECHOCARDIOGRAM COMPLETE
AVLVOTPG: 4 mmHg
CHL CUP RV SYS PRESS: 69 mmHg
EWDT: 127 ms
FS: 28 % (ref 28–44)
Height: 62 in
IVS/LV PW RATIO, ED: 1.06
LA diam end sys: 37 mm
LA vol A4C: 69.6 ml
LA vol index: 39.1 mL/m2
LADIAMINDEX: 2.06 cm/m2
LASIZE: 37 mm
LAVOL: 70.1 mL
LV dias vol index: 29 mL/m2
LVDIAVOL: 53 mL (ref 46–106)
LVOT SV: 43 mL
LVOT VTI: 17 cm
LVOT area: 2.54 cm2
LVOT diameter: 18 mm
LVOT peak vel: 96.1 cm/s
LVSYSVOL: 21 mL (ref 14–42)
LVSYSVOLIN: 12 mL/m2
MV Dec: 127
MV Peak grad: 7 mmHg
MVPKEVEL: 132 m/s
PW: 10.4 mm — AB (ref 0.6–1.1)
RV TAPSE: 12.2 mm
Reg peak vel: 369 cm/s
Simpson's disk: 61
Stroke v: 32 ml
TRMAXVEL: 369 cm/s
Weight: 2525.59 oz

## 2016-01-12 LAB — BASIC METABOLIC PANEL
BUN: 42 mg/dL — AB (ref 6–20)
BUN: 45 mg/dL — AB (ref 6–20)
CHLORIDE: 80 mmol/L — AB (ref 101–111)
CHLORIDE: 76 mmol/L — AB (ref 101–111)
CO2: >50 mmol/L — ABNORMAL HIGH (ref 22–32)
CREATININE: 1.09 mg/dL — AB (ref 0.44–1.00)
Calcium: 8.9 mg/dL (ref 8.9–10.3)
Calcium: 8.6 mg/dL — ABNORMAL LOW (ref 8.9–10.3)
Creatinine, Ser: 0.97 mg/dL (ref 0.44–1.00)
GFR calc Af Amer: 57 mL/min — ABNORMAL LOW (ref >60)
GFR calc non Af Amer: 56 mL/min — ABNORMAL LOW (ref >60)
GFR calc non Af Amer: 49 mL/min — ABNORMAL LOW (ref >60)
GLUCOSE: 465 mg/dL — AB (ref 65–99)
Glucose, Bld: 176 mg/dL — ABNORMAL HIGH (ref 65–99)
POTASSIUM: 3 mmol/L — AB (ref 3.5–5.1)
POTASSIUM: 2.5 mmol/L — AB (ref 3.5–5.1)
Sodium: 140 mmol/L (ref 135–145)
Sodium: 136 mmol/L (ref 135–145)

## 2016-01-12 LAB — MAGNESIUM: MAGNESIUM: 1.9 mg/dL (ref 1.7–2.4)

## 2016-01-12 MED ORDER — ALUM & MAG HYDROXIDE-SIMETH 200-200-20 MG/5ML PO SUSP
30.0000 mL | ORAL | Status: DC | PRN
  Administered 2016-01-12: 30 mL via ORAL
  Filled 2016-01-12: qty 30

## 2016-01-12 MED ORDER — POTASSIUM CHLORIDE CRYS ER 10 MEQ PO TBCR
10.0000 meq | EXTENDED_RELEASE_TABLET | Freq: Once | ORAL | Status: AC
  Administered 2016-01-12: 10 meq via ORAL

## 2016-01-12 MED ORDER — METHYLPREDNISOLONE SODIUM SUCC 125 MG IJ SOLR
60.0000 mg | INTRAMUSCULAR | Status: DC
  Administered 2016-01-13: 60 mg via INTRAVENOUS
  Filled 2016-01-12: qty 2

## 2016-01-12 MED ORDER — POTASSIUM CHLORIDE CRYS ER 20 MEQ PO TBCR
40.0000 meq | EXTENDED_RELEASE_TABLET | Freq: Once | ORAL | Status: AC
  Administered 2016-01-12: 40 meq via ORAL
  Filled 2016-01-12: qty 2

## 2016-01-12 MED ORDER — INSULIN GLARGINE 100 UNIT/ML ~~LOC~~ SOLN
20.0000 [IU] | Freq: Two times a day (BID) | SUBCUTANEOUS | Status: DC
  Administered 2016-01-12 – 2016-01-15 (×6): 20 [IU] via SUBCUTANEOUS
  Filled 2016-01-12 (×12): qty 0.2

## 2016-01-12 MED ORDER — SENNA 8.6 MG PO TABS
1.0000 | ORAL_TABLET | Freq: Once | ORAL | Status: AC
Start: 2016-01-12 — End: 2016-01-12
  Administered 2016-01-12: 8.6 mg via ORAL
  Filled 2016-01-12: qty 1

## 2016-01-12 MED ORDER — POTASSIUM CHLORIDE 10 MEQ/100ML IV SOLN
10.0000 meq | INTRAVENOUS | Status: AC
  Administered 2016-01-12 (×5): 10 meq via INTRAVENOUS
  Filled 2016-01-12: qty 100

## 2016-01-12 MED ORDER — INSULIN ASPART 100 UNIT/ML ~~LOC~~ SOLN
15.0000 [IU] | Freq: Once | SUBCUTANEOUS | Status: AC
  Administered 2016-01-12: 15 [IU] via SUBCUTANEOUS

## 2016-01-12 MED ORDER — POTASSIUM CHLORIDE CRYS ER 20 MEQ PO TBCR
20.0000 meq | EXTENDED_RELEASE_TABLET | Freq: Once | ORAL | Status: AC
  Administered 2016-01-12: 20 meq via ORAL
  Filled 2016-01-12: qty 1

## 2016-01-12 NOTE — Progress Notes (Signed)
Recheck CBG 527, patient is asymptomatic at this time. MD notified.

## 2016-01-12 NOTE — Progress Notes (Signed)
Patient Name: [name] [name] Date of Encounter: 01/12/2016  Primary Cardiologist: Nona DellMcDowell, Samuel MD  Hospital Problem List     Active Problems:   Essential hypertension, benign   COPD with acute exacerbation [facility](HCC)   Atrial fibrillation with RVR (HCC)   Chronic anticoagulation-CHADs VASc=6   Acute on chronic respiratory failure with hypoxia (HCC)   Uncontrolled type 2 diabetes mellitus with complication (HCC)   HCAP (healthcare-associated pneumonia)     Subjective  Breathing better but heart rate remains elevated.  Inpatient Medications    Scheduled Meds: . apixaban  5 mg Oral BID  . arformoterol  15 mcg Nebulization BID  . budesonide (PULMICORT) nebulizer solution  0.5 mg Nebulization BID  . diltiazem  90 mg Oral Q6H  . doxycycline  100 mg Oral Q12H  . furosemide  40 mg Intravenous Daily  . guaiFENesin  1,200 mg Oral BID  . insulin aspart  0-20 Units Subcutaneous TID WC  . insulin aspart  0-5 Units Subcutaneous QHS  . insulin glargine  18 Units Subcutaneous BID  . ipratropium  0.5 mg Nebulization Q6H  . levalbuterol  0.63 mg Nebulization Q6H  . levothyroxine  88 mcg Oral QAC breakfast  . methylPREDNISolone (SOLU-MEDROL) injection  40 mg Intravenous Q12H  . metoprolol tartrate  50 mg Oral BID  . pantoprazole  40 mg Oral Daily  . rosuvastatin  10 mg Oral QHS  . sodium chloride flush  3 mL Intravenous Q12H  . spironolactone  12.5 mg Oral Daily   Continuous Infusions:  PRN Meds: sodium chloride, ALPRAZolam, HYDROcodone-acetaminophen, levalbuterol, meclizine, sodium chloride flush   Vital Signs    Vitals:   01/12/16 0742 01/12/16 0750 01/12/16 0755 01/12/16 0810  BP:      Pulse:      Resp:      Temp:    97 F (36.1 C)  TempSrc:    Axillary  SpO2: 99% 99% 99%   Weight:      Height:        Intake/Output Summary (Last 24 hours) at 01/12/16 0825 Last data filed at 01/11/16 2100  Gross per 24 hour  Intake              600 ml  Output             2375 ml   Net            -1775 ml   Filed Weights   01/10/16 0500 01/11/16 0500 01/12/16 0500  Weight: 158 lb 15.2 oz (72.1 kg) 157 lb 13.6 oz (71.6 kg) 156 lb 4.9 oz (70.9 kg)    Physical Exam    GEN: Well nourished, well developed, in no acute distress.  HEENT: Grossly normal.  Neck: Supple, no JVD, carotid bruits, or masses. Cardiac: IRRR, tachycardic, 2/6 systolic murmurs, rubs, or gallops. No clubbing, cyanosis, edema.  Radials/DP/PT 2+ and equal bilaterally.  Respiratory:  Right middle and lower lobe crackles.  GI: Soft, nontender, nondistended, BS + x 4. MS: no deformity or atrophy. Skin: warm and dry, no rash. Neuro:  Strength and sensation are intact. Psych: AAOx3.  Normal affect.  Labs    CBC  Recent Labs  01/12/16 0547  WBC 9.8  HGB 11.3*  HCT 39.0  MCV 101.6*  PLT 150   Basic Metabolic Panel  Recent Labs  01/10/16 0912 01/12/16 0547  NA 133* 140  K 3.2* 3.0*  CL 82* 80*  CO2 44* >50*  GLUCOSE 326* 176*  BUN  34* 42*  CREATININE 1.09* 0.97  CALCIUM 8.7* 8.9     Telemetry    Atrial fib with rapid rates intermittent with normal rates.  - Personally Reviewed  ECG    Personally Reviewed  Radiology    No results found.  Cardiac Studies   Echo pending   Patient Profile    75 y/o female with chronic hypoxic respiratory failure, COPD, and diastolic CHF, with PAF on Eliquis, and CAD, admitted with COPD exacerbation and BiPAP dependent at night. Frequent hospitalizations for same.    Assessment & Plan    1. Atrial fib: On pol diltiazem and increased dose of metoprolol. No active wheezing. Heart rate has yet to be controlled. On steroids for COPD exacerbation which may be contributing to elevated HR in conjunction with significant deconditioning. Continue apixaban. No bleeding issues noted.   2. Hypertension: BP is stable. No changes on medications planned for now.   3. Hypokalemia: On spironolactone and received one dose of potassium yesterday. Will  start daily dose of 10 mEq po. Follow BMET. Check magnesium when last checked on 11/25/2015 1.5. ,   4. Chronic respiratory failure: Requires BiPAP at night. On O2 2/liters during the day.   5, Deconditioning: Physical therapy is working with her.       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 She will always be tachycardic due to comorbidities And COPD/Rx.  Only way to prevent likely would be AV node modification and  I don't think this is indicated. Continue current meds I don't think rate is impacting Her care. Continue eliquis for stroke prevention   Charlton HawsPeter Machele Deihl

## 2016-01-12 NOTE — Progress Notes (Signed)
CRITICAL VALUE ALERT  Critical value received:  K+ 2.5  Date of notification:  01/12/16  Time of notification:  1500  Critical value read back:Yes.    Nurse who received alert:  Zara Chessrystal Santrice Muzio, RN  MD notified (1st page):  Dr.Vega  Time of first page:  1501  MD notified (2nd page):  Time of second page:  Responding MD:  Dr.Vega   Time MD responded:  [phone]

## 2016-01-12 NOTE — Care Management Note (Signed)
Case Management Note  Patient Details  Name: Jeronimo NormaShirley O Sunde MRN: [id number] Date of Birth: 06/25/1940  If discussed at Long Length of Stay Meetings, dates discussed: 01/12/2016   Malcolm MetroChildress, Domnique Vanegas Demske, RN 01/12/2016, 10:18 AM

## 2016-01-12 NOTE — Progress Notes (Addendum)
PROGRESS NOTE    Jeronimo NormaShirley O Degracia  ZOX:[id number] DOB: 11/10/1940 DOA: 01/05/2016 PCP: Ignatius Speckinghruv B Vyas, MD    Brief Narrative:  This is 75 year old female with a history of atrial fibrillation, COPD and chronic respiratory failure on home oxygen who presents to the hospital with 2 weeks of worsening shortness of breath. Chest x-ray indicated pneumonia and she was also found to have COPD exacerbation as well as rapid atrial fibrillation. She was placed on BiPAP and admitted to the stepdown unit for further treatment. Pulmonology has been consulted and assisting with management and cardiology managing A. fib with RVR  Assessment & Plan:   Active Problems:   Essential hypertension, benign   COPD with acute exacerbation (HCC)   Atrial fibrillation with RVR (HCC)   Chronic anticoagulation-CHADs VASc=6   Acute on chronic respiratory failure with hypoxia (HCC)   Uncontrolled type 2 diabetes mellitus with complication (HCC)   HCAP (healthcare-associated pneumonia)  1. Acute on chronic respiratory failure with hypoxia. Likely related to HCAP and COPD exacerbation. She is chronically on 2-3L at home.  - Pulmonology on Board and managing. Currently plans for no change in current medical management  2. HCAP - Will narrow antibiotic regimen and place on doxycycline.   3. COPD exacerbation. Continue intravenous steroids. Continue bronchodilators and mucolytics. Continue patient on doxycycline  4. Uncontrolled diabetes. Started the patient on Lantus and resistant sliding-scale. Blood sugars have since improved  5. A. fib with RVR. CHADSVASc 6. She is anticoagulated with Eliquis.  Currently on cardizem 90 mg po q 6hr. Continue metoprolol. Cardiology managing. HR fluctuating but has been mostly controlled on current regimen. Cardiology on board  6. Hypertension. Blood pressures are currently stable.  7. Hypokalemia - replace orally and reassess - bmp ordered for next am.  DVT prophylaxis:  eliquis Code Status: full Family Communication: discussed with grand daughter at the bedside Disposition Plan: discharge home once improved   Consultants:   Pulmonology   Cardiology  Procedures:     Antimicrobials:   Vancomycin 11/7>>  cefepime 11/7>>    Subjective: She continues to reports her breathing is better. No new problems reported  Objective: Vitals:   01/12/16 0742 01/12/16 0750 01/12/16 0755 01/12/16 0810  BP:      Pulse:      Resp:      Temp:    97 F (36.1 C)  TempSrc:    Axillary  SpO2: 99% 99% 99%   Weight:      Height:        Intake/Output Summary (Last 24 hours) at 01/12/16 1013 Last data filed at 01/12/16 0933  Gross per 24 hour  Intake              720 ml  Output             2375 ml  Net            -1655 ml   Filed Weights   01/10/16 0500 01/11/16 0500 01/12/16 0500  Weight: 72.1 kg (158 lb 15.2 oz) 71.6 kg (157 lb 13.6 oz) 70.9 kg (156 lb 4.9 oz)    Examination:  General exam: Appears calm and comfortable, in nad. Respiratory system: no wheezes. Poor inspiratory effort. Equal chest rise, prolonged expiratory phase Cardiovascular system: S1 & S2 heard, irregular. No JVD, murmurs, rubs, gallops or clicks. No pedal edema. Gastrointestinal system: Abdomen is nondistended, soft and nontender. No organomegaly or masses felt. Normal bowel sounds heard. Central nervous system: Alert and oriented.  No focal neurological deficits. Extremities: Symmetric 5 x 5 power. Skin: No rashes, lesions or ulcers Psychiatry: Judgement and insight appear normal. Mood & affect appropriate.   Data Reviewed: I have personally reviewed following labs and imaging studies  CBC:  Recent Labs Lab 01/06/16 0513 01/07/16 0555 01/09/16 0502 01/12/16 0547  WBC 9.5 9.3 9.1 9.8  HGB 11.5* 11.8* 12.5 11.3*  HCT 37.5 38.6 41.9 39.0  MCV 96.9 98.0 99.1 101.6*  PLT 191 230 208 150   Basic Metabolic Panel:  Recent Labs Lab 01/06/16 0513 01/07/16 0555  01/08/16 1346 01/09/16 2108 01/10/16 0912 01/12/16 0547  NA 139 134* 135  --  133* 140  K 3.1* 3.8 3.0*  --  3.2* 3.0*  CL 96* 94* 88*  --  82* 80*  CO2 37* 33* 37*  --  44* >50*  GLUCOSE 108* 334* 425* 392* 326* 176*  BUN 17 30* 31*  --  34* 42*  CREATININE 0.78 0.98 1.01*  --  1.09* 0.97  CALCIUM 8.5* 8.6* 8.6*  --  8.7* 8.9  MG  --   --   --   --   --  1.9   GFR: Estimated Creatinine Clearance: 46.9 mL/min (by C-G formula based on SCr of 0.97 mg/dL). Liver Function Tests:  Recent Labs Lab 01/06/16 0513  AST 21  ALT 43  ALKPHOS 93  BILITOT 1.2  PROT 5.6*  ALBUMIN 3.1*   No results for input(s): LIPASE, AMYLASE in the last 168 hours. No results for input(s): AMMONIA in the last 168 hours. Coagulation Profile: No results for input(s): INR, PROTIME in the last 168 hours. Cardiac Enzymes: No results for input(s): CKTOTAL, CKMB, CKMBINDEX, TROPONINI in the last 168 hours. BNP (last 3 results) No results for input(s): PROBNP in the last 8760 hours. HbA1C: No results for input(s): HGBA1C in the last 72 hours. CBG:  Recent Labs Lab 01/11/16 0755 01/11/16 1154 01/11/16 1651 01/11/16 2146 01/12/16 0750  GLUCAP 194* 172* 268* 357* 165*   Lipid Profile: No results for input(s): CHOL, HDL, LDLCALC, TRIG, CHOLHDL, LDLDIRECT in the last 72 hours. Thyroid Function Tests: No results for input(s): TSH, T4TOTAL, FREET4, T3FREE, THYROIDAB in the last 72 hours. Anemia Panel: No results for input(s): VITAMINB12, FOLATE, FERRITIN, TIBC, IRON, RETICCTPCT in the last 72 hours. Sepsis Labs:  Recent Labs Lab 01/06/16 1051  LATICACIDVEN 2.1*    Recent Results (from the past 240 hour(s))  Culture, blood (Routine X 2) w Reflex to ID Panel     Status: None   Collection Time: 01/05/16  5:45 AM  Result Value Ref Range Status   Specimen Description BLOOD LEFT ANTECUBITAL  Final   Special Requests BOTTLES DRAWN AEROBIC AND ANAEROBIC [facility] EACH  Final   Culture NO GROWTH 5 DAYS   Final   Report Status 01/10/2016 FINAL  Final  Culture, blood (Routine X 2) w Reflex to ID Panel     Status: None   Collection Time: 01/05/16  5:51 AM  Result Value Ref Range Status   Specimen Description   Final    BLOOD BLOOD RIGHT HAND AEROBIC RH BLOOD LEFT HAND ANAEROBIC LH   Special Requests BOTTLES DRAWN AEROBIC AND ANAEROBIC [facility] EACH  Final   Culture NO GROWTH 5 DAYS  Final   Report Status 01/10/2016 FINAL  Final  Urine culture     Status: None   Collection Time: 01/05/16  6:11 AM  Result Value Ref Range Status   Specimen Description URINE, CATHETERIZED  Final   Special Requests NONE  Final   Culture NO GROWTH Performed at [facility]   Final   Report Status 01/06/2016 FINAL  Final  MRSA PCR Screening     Status: None   Collection Time: 01/05/16  8:57 AM  Result Value Ref Range Status   MRSA by PCR NEGATIVE NEGATIVE Final    Comment:        The GeneXpert MRSA Assay (FDA approved for NASAL specimens only), is one component of a comprehensive MRSA colonization surveillance program. It is not intended to diagnose MRSA infection nor to guide or monitor treatment for MRSA infections.   Culture, blood (routine x 2) Call MD if unable to obtain prior to antibiotics being given     Status: None   Collection Time: 01/05/16  9:54 AM  Result Value Ref Range Status   Specimen Description BLOOD RIGHT ANTECUBITAL  Final   Special Requests BOTTLES DRAWN AEROBIC AND ANAEROBIC 10 cc each  Final   Culture NO GROWTH 5 DAYS  Final   Report Status 01/10/2016 FINAL  Final  Culture, blood (routine x 2) Call MD if unable to obtain prior to antibiotics being given     Status: None   Collection Time: 01/05/16 10:03 AM  Result Value Ref Range Status   Specimen Description BLOOD BLOOD LEFT HAND  Final   Special Requests BOTTLES DRAWN AEROBIC AND ANAEROBIC 6 cc each  Final   Culture NO GROWTH 5 DAYS  Final   Report Status 01/10/2016 FINAL  Final  Culture, sputum-assessment      Status: None   Collection Time: 01/05/16  3:00 PM  Result Value Ref Range Status   Specimen Description EXPECTORATED SPUTUM  Final   Special Requests NONE  Final   Sputum evaluation   Final    THIS SPECIMEN IS ACCEPTABLE. RESPIRATORY CULTURE REPORT TO FOLLOW. Performed at [facility]    Report Status 01/05/2016 FINAL  Final  Culture, respiratory (NON-Expectorated)     Status: None   Collection Time: 01/05/16  3:00 PM  Result Value Ref Range Status   Specimen Description EXPECTORATED SPUTUM  Final   Special Requests NONE  Final   Gram Stain   Final    ABUNDANT WBC PRESENT, PREDOMINANTLY PMN ABUNDANT GRAM POSITIVE COCCI IN PAIRS IN CLUSTERS MODERATE GRAM NEGATIVE RODS FEW GRAM NEGATIVE COCCOBACILLI FEW GRAM POSITIVE RODS FEW GRAM VARIABLE ROD FEW SQUAMOUS EPITHELIAL CELLS PRESENT FEW GRAM NEGATIVE DIPLOCOCCI    Culture   Final    Consistent with normal respiratory flora. Performed at [facility]    Report Status 01/08/2016 FINAL  Final         Radiology Studies: No results found.      Scheduled Meds: . apixaban  5 mg Oral BID  . arformoterol  15 mcg Nebulization BID  . budesonide (PULMICORT) nebulizer solution  0.5 mg Nebulization BID  . diltiazem  90 mg Oral Q6H  . doxycycline  100 mg Oral Q12H  . furosemide  40 mg Intravenous Daily  . guaiFENesin  1,200 mg Oral BID  . insulin aspart  0-20 Units Subcutaneous TID WC  . insulin aspart  0-5 Units Subcutaneous QHS  . insulin glargine  18 Units Subcutaneous BID  . ipratropium  0.5 mg Nebulization Q6H  . levalbuterol  0.63 mg Nebulization Q6H  . levothyroxine  88 mcg Oral QAC breakfast  . methylPREDNISolone (SOLU-MEDROL) injection  40 mg Intravenous Q12H  . metoprolol tartrate  50 mg  Oral BID  . pantoprazole  40 mg Oral Daily  . potassium chloride  10 mEq Oral Once  . rosuvastatin  10 mg Oral QHS  . sodium chloride flush  3 mL Intravenous Q12H  . spironolactone  12.5 mg Oral Daily   Continuous  Infusions:   LOS: 7 days   Time spent: > 35 mins  Penny PiaVEGA, Anuradha Chabot, MD [facility] Pager [phone]  If 7PM-7AM, please contact night-coverage [email or web address] Password [email or web address] 01/12/2016, 10:13 AM   Addendum: Was called about hyperglycemia. Patient has had several rounds of short acting insulin. BMT ordered. Have increase long-acting insulin. At this point unable to use insulin drip because of hypokalemia. We'll administer potassium IV and orally. After potassium is readministered may consider placing on insulin drip should patient require this. Have also placed order for diabetic diet

## 2016-01-12 NOTE — Progress Notes (Signed)
Inpatient Diabetes Program Recommendations  AACE/ADA: New Consensus Statement on Inpatient Glycemic Control (2015)  Target Ranges:  Prepandial:   less than 140 mg/dL      Peak postprandial:   less than 180 mg/dL (1-2 hours)      Critically ill patients:  140 - 180 mg/dL  Results for [name] [name], [name] [name] (MRN [id number]) as of 01/12/2016 09:20  Ref. Range 01/11/2016 07:55 01/11/2016 11:54 01/11/2016 16:51 01/11/2016 21:46 01/12/2016 07:50  Glucose-Capillary Latest Ref Range: 65 - 99 mg/dL 324194 (H) 401172 (H) 027268 (H) 357 (H) 165 (H)    Review of Glycemic Control  Diabetes history: DM2 Outpatient Diabetes medications: Glipizide 10 mg BID, Lantus 15 units BID Current orders for Inpatient glycemic control: Lantus 18 units BID, Novlog 0-20 units TID with meals, Novolog 0-5 units QHS  Inpatient Diabetes Program Recommendations: Insulin - Meal Coverage: Glucose ranged from 172-357 mg/dl on 25/36/6411/13/17. If steroids are continued, please consider ordering Novolog 5 units TID with meals for meal coverage if patient eats at least 50% of meals.  Thanks, Orlando PennerMarie Elvera Almario, RN, MSN, CDE Diabetes Coordinator Inpatient Diabetes Program [phone] (Team Pager from 8am to 5pm)

## 2016-01-12 NOTE — Progress Notes (Signed)
1319 Spoke with Dr.Vega regarding patient's CBG 527. New orders given to administer Novolog Insulin 15 units SQ x 1 time only and recheck CBG in 1.5hrs

## 2016-01-12 NOTE — Progress Notes (Signed)
Came in room to check pt's BIPAP machine pt had machine off stated "she didn't like it and don't won't to wear it. Her breathing is alright."  Vital signs are hr 90 rr 24 spo2 99 on 10lpm hfnc. Will continue to monitor through out the night

## 2016-01-12 NOTE — Care Management Note (Signed)
Case Management Note  Patient Details  Name: Jeronimo NormaShirley O Giammona MRN: [id number] Date of Birth: 03/27/1940  Expected Discharge Date:      01/17/2016            Expected Discharge Plan:  Home w Home Health Services  In-House Referral:  NA  Discharge planning Services  CM Consult  Post Acute Care Choice:  Home Health Choice offered to:  Patient  DME Arranged:    DME Agency:     HH Arranged:  RN, PT HH Agency:  [facility]  Status of Service:  In process, will continue to follow  Additional Comments: PT has recommended HH PT. Pt is agreeable and has requested [facility] as she has used them in the past. Alroy BailiffLinda Lothian, [facility], is aware of referral and will obtain pt info from chart. Pt not yet ready for DC. Will cont to follow.   Malcolm Metrohildress, Kinze Labo Demske, RN 01/12/2016, 1:42 PM

## 2016-01-12 NOTE — Progress Notes (Signed)
1144 Spoke with Dr.Vega regarding patient's CBG 541. Orders given to give Novolog Insulin 20 units SQ x 1 time only and monitor CBG Q4H.

## 2016-01-12 NOTE — Progress Notes (Signed)
Subjective: She refused BiPAP last night but looks okay this morning. She is on nasal oxygen. She says she was able to walk a little bit yesterday. She feels like she is getting stronger. Her cough is less. No nausea vomiting diarrhea. No chest pain. No hemoptysis.  Objective: Vital signs in last 24 hours: Temp:  [96.5 F (35.8 C)-98.5 F (36.9 C)] 97.5 F (36.4 C) (11/14 0400) Pulse Rate:  [56-123] 73 (11/13 2100) Resp:  [23-34] 27 (11/13 2100) BP: (124-169)/(60-89) 134/60 (11/13 1700) SpO2:  [65 %-100 %] 96 % (11/14 0117) FiO2 (%):  [45 %] 45 % (11/13 2129) Weight:  [70.9 kg (156 lb 4.9 oz)] 70.9 kg (156 lb 4.9 oz) (11/14 0500) Weight change: -0.7 kg (-1 lb 8.7 oz) Last BM Date: 01/08/16  Intake/Output from previous day: 11/13 0701 - 11/14 0700 In: 600 [P.O.:600] Out: 2975 [Urine:2975]  PHYSICAL EXAM General appearance: alert, cooperative and mild distress Resp: Her chest shows rhonchi bilaterally but clearer than before Cardio: irregularly irregular rhythm GI: soft, non-tender; bowel sounds normal; no masses,  no organomegaly Extremities: Trace edema Skin warm and dry. Mucous membranes are moist. Pupils react  Lab Results:  Results for orders placed or performed during the hospital encounter of 01/05/16 (from the past 48 hour(s))  Basic metabolic panel     Status: Abnormal   Collection Time: 01/10/16  9:12 AM  Result Value Ref Range   Sodium 133 (L) 135 - 145 mmol/L   Potassium 3.2 (L) 3.5 - 5.1 mmol/L   Chloride 82 (L) 101 - 111 mmol/L   CO2 44 (H) 22 - 32 mmol/L   Glucose, Bld 326 (H) 65 - 99 mg/dL   BUN 34 (H) 6 - 20 mg/dL   Creatinine, Ser 1.09 (H) 0.44 - 1.00 mg/dL   Calcium 8.7 (L) 8.9 - 10.3 mg/dL   GFR calc non Af Amer 49 (L) >60 mL/min   GFR calc Af Amer 57 (L) >60 mL/min    Comment: (NOTE) The eGFR has been calculated using the CKD EPI equation. This calculation has not been validated in all clinical situations. eGFR's persistently <60 mL/min signify  possible Chronic Kidney Disease.    Anion gap 7 5 - 15  Glucose, capillary     Status: Abnormal   Collection Time: 01/10/16 11:34 AM  Result Value Ref Range   Glucose-Capillary 309 (H) 65 - 99 mg/dL   Comment 1 Notify RN    Comment 2 Document in Chart   Glucose, capillary     Status: Abnormal   Collection Time: 01/10/16  3:50 PM  Result Value Ref Range   Glucose-Capillary 366 (H) 65 - 99 mg/dL   Comment 1 Notify RN    Comment 2 Document in Chart   Glucose, capillary     Status: Abnormal   Collection Time: 01/10/16  9:09 PM  Result Value Ref Range   Glucose-Capillary 377 (H) 65 - 99 mg/dL   Comment 1 Notify RN    Comment 2 Document in Chart   Glucose, capillary     Status: Abnormal   Collection Time: 01/11/16  7:55 AM  Result Value Ref Range   Glucose-Capillary 194 (H) 65 - 99 mg/dL   Comment 1 Notify RN   Glucose, capillary     Status: Abnormal   Collection Time: 01/11/16 11:54 AM  Result Value Ref Range   Glucose-Capillary 172 (H) 65 - 99 mg/dL   Comment 1 Notify RN   Glucose, capillary  Status: Abnormal   Collection Time: 01/11/16  4:51 PM  Result Value Ref Range   Glucose-Capillary 268 (H) 65 - 99 mg/dL   Comment 1 Notify RN   Glucose, capillary     Status: Abnormal   Collection Time: 01/11/16  9:46 PM  Result Value Ref Range   Glucose-Capillary 357 (H) 65 - 99 mg/dL  Basic metabolic panel     Status: Abnormal   Collection Time: 01/12/16  5:47 AM  Result Value Ref Range   Sodium 140 135 - 145 mmol/L    Comment: DELTA CHECK NOTED   Potassium 3.0 (L) 3.5 - 5.1 mmol/L   Chloride 80 (L) 101 - 111 mmol/L   CO2 >50 (H) 22 - 32 mmol/L   Glucose, Bld 176 (H) 65 - 99 mg/dL   BUN 42 (H) 6 - 20 mg/dL   Creatinine, Ser 0.97 0.44 - 1.00 mg/dL   Calcium 8.9 8.9 - 10.3 mg/dL   GFR calc non Af Amer 56 (L) >60 mL/min   GFR calc Af Amer >60 >60 mL/min    Comment: (NOTE) The eGFR has been calculated using the CKD EPI equation. This calculation has not been validated in all  clinical situations. eGFR's persistently <60 mL/min signify possible Chronic Kidney Disease.   CBC     Status: Abnormal   Collection Time: 01/12/16  5:47 AM  Result Value Ref Range   WBC 9.8 4.0 - 10.5 K/uL   RBC 3.84 (L) 3.87 - 5.11 MIL/uL   Hemoglobin 11.3 (L) 12.0 - 15.0 g/dL   HCT 39.0 36.0 - 46.0 %   MCV 101.6 (H) 78.0 - 100.0 fL   MCH 29.4 26.0 - 34.0 pg   MCHC 29.0 (L) 30.0 - 36.0 g/dL   RDW 14.6 11.5 - 15.5 %   Platelets 150 150 - 400 K/uL    ABGS No results for input(s): PHART, PO2ART, TCO2, HCO3 in the last 72 hours.  Invalid input(s): PCO2 CULTURES Recent Results (from the past 240 hour(s))  Culture, blood (Routine X 2) w Reflex to ID Panel     Status: None   Collection Time: 01/05/16  5:45 AM  Result Value Ref Range Status   Specimen Description BLOOD LEFT ANTECUBITAL  Final   Special Requests BOTTLES DRAWN AEROBIC AND ANAEROBIC [place]  Final   Culture NO GROWTH 5 DAYS  Final   Report Status 01/10/2016 FINAL  Final  Culture, blood (Routine X 2) w Reflex to ID Panel     Status: None   Collection Time: 01/05/16  5:51 AM  Result Value Ref Range Status   Specimen Description   Final    BLOOD BLOOD RIGHT HAND AEROBIC RH BLOOD LEFT HAND ANAEROBIC LH   Special Requests BOTTLES DRAWN AEROBIC AND ANAEROBIC [place]  Final   Culture NO GROWTH 5 DAYS  Final   Report Status 01/10/2016 FINAL  Final  Urine culture     Status: None   Collection Time: 01/05/16  6:11 AM  Result Value Ref Range Status   Specimen Description URINE, CATHETERIZED  Final   Special Requests NONE  Final   Culture NO GROWTH Performed at [facility]   Final   Report Status 01/06/2016 FINAL  Final  MRSA PCR Screening     Status: None   Collection Time: 01/05/16  8:57 AM  Result Value Ref Range Status   MRSA by PCR NEGATIVE NEGATIVE Final    Comment:        The  GeneXpert MRSA Assay (FDA approved for NASAL specimens only), is one component of a comprehensive MRSA  colonization surveillance program. It is not intended to diagnose MRSA infection nor to guide or monitor treatment for MRSA infections.   Culture, blood (routine x 2) Call MD if unable to obtain prior to antibiotics being given     Status: None   Collection Time: 01/05/16  9:54 AM  Result Value Ref Range Status   Specimen Description BLOOD RIGHT ANTECUBITAL  Final   Special Requests BOTTLES DRAWN AEROBIC AND ANAEROBIC 10 cc each  Final   Culture NO GROWTH 5 DAYS  Final   Report Status 01/10/2016 FINAL  Final  Culture, blood (routine x 2) Call MD if unable to obtain prior to antibiotics being given     Status: None   Collection Time: 01/05/16 10:03 AM  Result Value Ref Range Status   Specimen Description BLOOD BLOOD LEFT HAND  Final   Special Requests BOTTLES DRAWN AEROBIC AND ANAEROBIC 6 cc each  Final   Culture NO GROWTH 5 DAYS  Final   Report Status 01/10/2016 FINAL  Final  Culture, sputum-assessment     Status: None   Collection Time: 01/05/16  3:00 PM  Result Value Ref Range Status   Specimen Description EXPECTORATED SPUTUM  Final   Special Requests NONE  Final   Sputum evaluation   Final    THIS SPECIMEN IS ACCEPTABLE. RESPIRATORY CULTURE REPORT TO FOLLOW. Performed at [facility]    Report Status 01/05/2016 FINAL  Final  Culture, respiratory (NON-Expectorated)     Status: None   Collection Time: 01/05/16  3:00 PM  Result Value Ref Range Status   Specimen Description EXPECTORATED SPUTUM  Final   Special Requests NONE  Final   Gram Stain   Final    ABUNDANT WBC PRESENT, PREDOMINANTLY PMN ABUNDANT GRAM POSITIVE COCCI IN PAIRS IN CLUSTERS MODERATE GRAM NEGATIVE RODS FEW GRAM NEGATIVE COCCOBACILLI FEW GRAM POSITIVE RODS FEW GRAM VARIABLE ROD FEW SQUAMOUS EPITHELIAL CELLS PRESENT FEW GRAM NEGATIVE DIPLOCOCCI    Culture   Final    Consistent with normal respiratory flora. Performed at [facility]    Report Status 01/08/2016 FINAL  Final    Studies/Results: No results found.  Medications:  Prior to Admission:  Prescriptions Prior to Admission  Medication Sig Dispense Refill Last Dose  . ALPRAZolam (XANAX) 0.5 MG tablet Take 0.5 mg by mouth 3 (three) times daily as needed for anxiety.   01/04/2016 at Unknown time  . dexlansoprazole (DEXILANT) 60 MG capsule Take 60 mg by mouth daily.     01/04/2016 at Unknown time  . diltiazem (CARDIZEM CD) 360 MG 24 hr capsule Take 1 capsule (360 mg total) by mouth daily. 30 capsule 0 01/04/2016 at Unknown time  . ELIQUIS 5 MG TABS tablet TAKE 1 TABLET BY MOUTH TWICE DAILY 60 tablet 5 01/04/2016 at 1700  . Fluticasone Furoate-Vilanterol (BREO ELLIPTA) 100-25 MCG/INH AEPB Inhale 1 puff into the lungs daily as needed (wheezing/shortness of breath).    unknown  . furosemide (LASIX) 40 MG tablet Take 1 tablet (40 mg total) by mouth daily. 30 tablet 1 01/04/2016 at Unknown time  . glipiZIDE (GLUCOTROL) 10 MG tablet Take 1 tablet (10 mg total) by mouth 2 (two) times daily before a meal. Do not take glipizide if blood sugar falls below 125.   01/04/2016 at Unknown time  . HYDROcodone-acetaminophen (NORCO/VICODIN) 5-325 MG tablet Take 1 tablet by mouth 2 (two) times daily as  needed for moderate pain.   unknown  . insulin glargine (LANTUS) 100 UNIT/ML injection Inject 0.15 mLs (15 Units total) into the skin 2 (two) times daily. 10 mL 11 01/04/2016 at Unknown time  . Insulin Syringes, Disposable, U-100 1 ML MISC 15 Units by Does not apply route 2 (two) times daily. 200 each 6 01/04/2016 at Unknown time  . levalbuterol (XOPENEX) 0.63 MG/3ML nebulizer solution Take 3 mLs (0.63 mg total) by nebulization every 3 (three) hours as needed for wheezing or shortness of breath. 3 mL 12 01/04/2016 at Unknown time  . levothyroxine (SYNTHROID, LEVOTHROID) 88 MCG tablet Take 88 mcg by mouth daily before breakfast.   01/04/2016 at Unknown time  . meclizine (ANTIVERT) 25 MG tablet Take 12.5 mg by mouth daily as needed for dizziness.    unknown  . metoprolol tartrate (LOPRESSOR) 25 MG tablet Take 25 mg by mouth 2 (two) times daily.   01/04/2016 at 1700  . nitroGLYCERIN (NITROSTAT) 0.4 MG SL tablet Place 1 tablet (0.4 mg total) under the tongue every 5 (five) minutes x 3 doses as needed for chest pain. For severe chest pain 25 tablet 3 unknown  . OXYGEN Inhale 2-3 L into the lungs continuous.   01/04/2016 at Unknown time  . rosuvastatin (CRESTOR) 10 MG tablet Take 1 tablet (10 mg total) by mouth daily. (Patient taking differently: Take 10 mg by mouth at bedtime. ) 30 tablet 6 01/04/2016 at Unknown time  . Vitamin D, Ergocalciferol, (DRISDOL) 50000 units CAPS capsule Take 50,000 Units by mouth every 7 (seven) days. Takes on Mondays.   Past Week at Unknown time  . [DISCONTINUED] levofloxacin (LEVAQUIN) 500 MG tablet Take 1 tablet (500 mg total) by mouth daily. Antibiotic. Starting 12/12/15 take 1 tablet daily until completed. 4 tablet 0   . [DISCONTINUED] predniSONE (DELTASONE) 10 MG tablet Starting 12/12/15, take 6 tablets daily for 1 day; then 5 tablets the next day for 1 day; then 4 tablets the next day for 1 day; then 3 tablets the next day for 1 day; then 2 tablets the next day for 1 day; then 1 tablet the next day for 1 day; then stop. 21 tablet 0    Scheduled: . apixaban  5 mg Oral BID  . arformoterol  15 mcg Nebulization BID  . budesonide (PULMICORT) nebulizer solution  0.5 mg Nebulization BID  . diltiazem  90 mg Oral Q6H  . doxycycline  100 mg Oral Q12H  . furosemide  40 mg Intravenous Daily  . guaiFENesin  1,200 mg Oral BID  . insulin aspart  0-20 Units Subcutaneous TID WC  . insulin aspart  0-5 Units Subcutaneous QHS  . insulin glargine  18 Units Subcutaneous BID  . ipratropium  0.5 mg Nebulization Q6H  . levalbuterol  0.63 mg Nebulization Q6H  . levothyroxine  88 mcg Oral QAC breakfast  . methylPREDNISolone (SOLU-MEDROL) injection  40 mg Intravenous Q12H  . metoprolol tartrate  50 mg Oral BID  . pantoprazole  40 mg  Oral Daily  . rosuvastatin  10 mg Oral QHS  . sodium chloride flush  3 mL Intravenous Q12H  . spironolactone  12.5 mg Oral Daily   Continuous:  [facility]:WEXHBZ chloride, ALPRAZolam, HYDROcodone-acetaminophen, levalbuterol, meclizine, sodium chloride flush  Assesment: She was admitted with acute on chronic hypoxic respiratory failure with healthcare associated pneumonia. She is at risk of aspiration. She is deconditioned but working with physical therapy and that seems to have helped she is doing better with that. She  had been on BiPAP but refused it last night however she seems okay without it at this point. She has atrial fib with rapid ventricular response and had echocardiogram done yesterday results are pending. Active Problems:   Essential hypertension, benign   COPD with acute exacerbation (HCC)   Atrial fibrillation with RVR (HCC)   Chronic anticoagulation-CHADs VASc=6   Acute on chronic respiratory failure with hypoxia ([place])   Uncontrolled type 2 diabetes mellitus with complication ([place])   HCAP (healthcare-associated pneumonia)    Plan: Continue current treatments. She is slowly improving.    LOS: 7 days   Voris Tigert L 01/12/2016, 7:45 AM

## 2016-01-13 DIAGNOSIS — D803 Selective deficiency of immunoglobulin G [IgG] subclasses: Secondary | ICD-10-CM | POA: Diagnosis present

## 2016-01-13 LAB — BASIC METABOLIC PANEL WITH GFR
Anion gap: 7 (ref 5–15)
BUN: 43 mg/dL — ABNORMAL HIGH (ref 6–20)
CO2: 48 mmol/L — ABNORMAL HIGH (ref 22–32)
Calcium: 9 mg/dL (ref 8.9–10.3)
Chloride: 83 mmol/L — ABNORMAL LOW (ref 101–111)
Creatinine, Ser: 0.99 mg/dL (ref 0.44–1.00)
GFR calc Af Amer: >60 mL/min
GFR calc non Af Amer: 55 mL/min — ABNORMAL LOW
Glucose, Bld: 248 mg/dL — ABNORMAL HIGH (ref 65–99)
Potassium: 4.2 mmol/L (ref 3.5–5.1)
Sodium: 138 mmol/L (ref 135–145)

## 2016-01-13 LAB — GLUCOSE, CAPILLARY
GLUCOSE-CAPILLARY: 198 mg/dL — AB (ref 65–99)
GLUCOSE-CAPILLARY: 272 mg/dL — AB (ref 65–99)
Glucose-Capillary: 142 mg/dL — ABNORMAL HIGH (ref 65–99)
Glucose-Capillary: 124 mg/dL — ABNORMAL HIGH (ref 65–99)
Glucose-Capillary: 260 mg/dL — ABNORMAL HIGH (ref 65–99)
Glucose-Capillary: 283 mg/dL — ABNORMAL HIGH (ref 65–99)

## 2016-01-13 MED ORDER — PREDNISONE 20 MG PO TABS
50.0000 mg | ORAL_TABLET | Freq: Every day | ORAL | Status: DC
  Administered 2016-01-14 – 2016-01-15 (×2): 50 mg via ORAL
  Filled 2016-01-13 (×2): qty 2

## 2016-01-13 MED ORDER — IPRATROPIUM BROMIDE 0.02 % IN SOLN
0.5000 mg | Freq: Three times a day (TID) | RESPIRATORY_TRACT | Status: DC
  Administered 2016-01-14 – 2016-01-15 (×4): 0.5 mg via RESPIRATORY_TRACT
  Filled 2016-01-13 (×5): qty 2.5

## 2016-01-13 MED ORDER — LEVALBUTEROL HCL 0.63 MG/3ML IN NEBU
0.6300 mg | INHALATION_SOLUTION | Freq: Three times a day (TID) | RESPIRATORY_TRACT | Status: DC
  Administered 2016-01-14 – 2016-01-15 (×4): 0.63 mg via RESPIRATORY_TRACT
  Filled 2016-01-13 (×5): qty 3

## 2016-01-13 MED ORDER — DOCUSATE SODIUM 100 MG PO CAPS
200.0000 mg | ORAL_CAPSULE | Freq: Two times a day (BID) | ORAL | Status: DC
  Administered 2016-01-13 – 2016-01-15 (×5): 200 mg via ORAL
  Filled 2016-01-13 (×5): qty 2

## 2016-01-13 NOTE — Progress Notes (Signed)
Inpatient Diabetes Program Recommendations  AACE/ADA: New Consensus Statement on Inpatient Glycemic Control (2015)  Target Ranges:  Prepandial:   less than 140 mg/dL      Peak postprandial:   less than 180 mg/dL (1-2 hours)      Critically ill patients:  140 - 180 mg/dL   Results for [name], Shaunette O (MRN [id number]) as of 01/13/2016 09:17  Ref. Range 01/12/2016 07:50 01/12/2016 11:22 01/12/2016 13:11 01/12/2016 15:09 01/12/2016 16:15 01/12/2016 17:41 01/12/2016 19:22 01/13/2016 01:04 01/13/2016 05:29 01/13/2016 07:50  Glucose-Capillary Latest Ref Range: 65 - 99 mg/dL 782165 (H) 956541 (HH) 213527 (HH) 421 (H) 340 (H) 310 (H) 292 (H) 198 (H) 142 (H) 124 (H)    Review of Glycemic Control  Diabetes history:DM2 Outpatient Diabetes medications: Glipizide 10 mg BID, Lantus 15 units BID Current orders for Inpatient glycemic control: Lantus 20 units BID, Novlog 0-20 units TID with meals, Novolog 0-5 units QHS  Inpatient Diabetes Program Recommendations: Insulin - Meal Coverage: Glucose ranged from 165-541 mg/dl on 08/65/7811/14/17 and post prandial glucose is consistently elevated. If steroids are continued, please consider ordering Novolog 6 units TID with meals for meal coverage if patient eats at least 50% of meals.  Thanks, Orlando PennerMarie Yazhini Mcaulay, RN, MSN, CDE Diabetes Coordinator Inpatient Diabetes Program [phone] (Team Pager from 8am to 5pm)

## 2016-01-13 NOTE — Progress Notes (Signed)
Physical Therapy Treatment Patient Details Name: [name] [name] MRN: [id number] DOB: 12/30/1940 Today's Date: 01/13/2016    History of Present Illness 75 year old female with a history of atrial fibrillation, COPD and chronic respiratory failure on home oxygen who presents to the hospital with 2 weeks of worsening shortness of breath. Chest x-ray indicated pneumonia and she was also found to have COPD exacerbation as well as rapid atrial fibrillation. She was placed on BiPAP and admitted to the stepdown unit for further treatment.    PT Comments    Pt received in bed, and was agreeable to PT tx.  Pt was only able to ambulate 3430ft with RW and Min A today due to desaturation of SpO2 down to 82% while on 6L HFNC.  Pt's HR ranged from 88bpm - 128bpm during PT tx today.  Long discussion regarding d/c plans today.  Pt expressed that his son has expressed that he would be at home with her if she needed, and pt stated that her dtr, who lives next door, has also volunteered to assist her as needed.  Pt expressed that she really wants to go home, and have her family care for her.  Pt 's greatest limitation continue to be her elevated HR during mobility, as well as SpO2 desaturation.  Will continue to recommend HHPT with supervision.    Follow Up Recommendations  Home health PT;Supervision - Intermittent     Equipment Recommendations  None recommended by PT    Recommendations for Other Services       Precautions / Restrictions Precautions Precautions: None Restrictions Weight Bearing Restrictions: No    Mobility  Bed Mobility Overal bed mobility: Modified Independent Bed Mobility: Supine to Sit;Sit to Supine     Supine to sit: HOB elevated;Modified independent (Device/Increase time) (increased time & use of bed rail. ) Sit to supine: Min assist (To get LE's into the bed. )      Transfers Overall transfer level: Needs assistance Equipment used: Rolling walker (2 wheeled) Transfers: Sit  to/from Stand Sit to Stand: Min guard            Ambulation/Gait Ambulation/Gait assistance: Min guard Ambulation Distance (Feet): 30 Feet Assistive device: Rolling walker (2 wheeled) Gait Pattern/deviations: Step-through pattern;Trunk flexed     General Gait Details: decreased gait speed.  PT requested pt to take 2 standing rest breaks due to SpO2 desaturating to 82% during ambulation while on 6L of HFNC.  Pt was unable to improve SpO2 >90 with standing rest and PLB, therefore O2 increased during mobility to 8L HFNC.  Upon return to the room, she returned back into the bed, and SpO2 improved to 93% on 5L.    Stairs            Wheelchair Mobility    Modified Rankin (Stroke Patients Only)       Balance Overall balance assessment: Needs assistance Sitting-balance support: Bilateral upper extremity supported;Feet supported Sitting balance-Leahy Scale: Fair     Standing balance support: Bilateral upper extremity supported Standing balance-Leahy Scale: Fair                      Cognition Arousal/Alertness: Awake/alert Behavior During Therapy: WFL for tasks assessed/performed Overall Cognitive Status: Within Functional Limits for tasks assessed                      Exercises      General Comments        Pertinent Vitals/Pain Pain  Assessment: No/denies pain    Home Living                      Prior Function            PT Goals (current goals can now be found in the care plan section) Acute Rehab PT Goals Patient Stated Goal: To go home.  PT Goal Formulation: With patient Time For Goal Achievement: 01/18/16 Potential to Achieve Goals: Good Progress towards PT goals: Progressing toward goals    Frequency    Min 3X/week      PT Plan Current plan remains appropriate    Co-evaluation             End of Session Equipment Utilized During Treatment: Gait belt;Oxygen Activity Tolerance: Patient limited by  fatigue Patient left: in bed;with call bell/phone within reach     Time: 1450-1525 PT Time Calculation (min) (ACUTE ONLY): 35 min  Charges:  $Gait Training: 8-22 mins $Therapeutic Activity: 8-22 mins                    G Codes:      Beth Danie Hannig, PT, DPT X: [phone]

## 2016-01-13 NOTE — Progress Notes (Signed)
01/13/16  0840  Patient bed in chair position while eating breakfast.

## 2016-01-13 NOTE — Progress Notes (Signed)
Informed by central telemetry that patient was in Afib.  Patient was assessed and denied chest pain or discomfort. Vital signs are as follows   01/13/16 1914  Vitals  Temp 97.6 F (36.4 C)  Temp Source Oral  BP (!) 141/74  BP Location Left Arm  BP Method Automatic  Patient Position (if appropriate) Lying  Pulse Rate 79  Pulse Rate Source Dinamap  Resp 17  Report given to oncoming RN. Will continue to monitor patient throughout the shift.

## 2016-01-13 NOTE — Progress Notes (Addendum)
PROGRESS NOTE    Jeronimo NormaShirley O Uselman  ZOX:[id number] DOB: 07/10/1940 DOA: 01/05/2016 PCP: Ignatius Speckinghruv B Vyas, MD    Brief Narrative:   This is 75 year old female with a history of atrial fibrillation, COPD and chronic respiratory failure on home oxygen who presents to the hospital with 2 weeks of worsening shortness of breath. Chest x-ray indicated pneumonia and she was also found to have COPD exacerbation as well as rapid atrial fibrillation. She was placed on BiPAP and admitted to the stepdown unit for further treatment. Pulmonology has been consulted and assisting with management and cardiology managing A. fib with RVR   Assessment & Plan:   1. Acute on chronic respiratory failure with hypoxia. Likely related to HCAP and COPD exacerbation along with possible undiagnosed sleep apnea. She is chronically on 2-3L at home.  Pulmonology on Board and managing. Much improved on IV steroids, antibiotics, is requiring nighttime BiPAP since admission, discussed with pulmonary, also discussed with case management. Nighttime BiPAP will be arranged with outpatient sleep study. Discharge once BiPAP was arranged.  2. HCAP - antibiotics tapered to doxycycline continue for 2 more days.  3. COPD exacerbation. Much improved, we'll continue doxycycline for 2 more days, switch to oral steroids.  4. Hypokalemia - replaced  5. A. fib with RVR. CHADSVASc 6. She is anticoagulated with Eliquis.  Currently on cardizem 90 mg po q 6hr & metoprolol. Cardiology managing.   6. Hypertension. Blood pressures are currently stable. Continue medications as in #5.  7. Uncontrolled diabetes. On Lantus and resistant sliding-scale. Blood sugars have since improved.  Lab Results  Component Value Date   HGBA1C 8.9 (H) 12/09/2015   CBG (last 3)   Recent Labs  01/13/16 0104 01/13/16 0529 01/13/16 0750  GLUCAP 198* 142* 124*    Addendum. I was called by patient's granddaughter at 10:50 AM on 01/13/2016. She informed me that  she is in the medical field and she will want me to order a stat chest x-ray and ABG to evaluate her grandmothers condition. I informed her that her grandmother was seen this morning and she was feeling much better in fact wanted to go home. She has already been titrated down on oxygen, I just confirmed with the nurse patient is absolutely comfortable. I had already discussed the plan with pulmonary physician Dr. Juanetta GoslingHawkins this morning. I informed the patient's granddaughter that clinically these tests are not warranted and will not be ordered. She was extremely disrespectful and condescending.   DVT prophylaxis: eliquis Code Status: Full Family Communication: previous MD discussed with grand daughter at the bedside Disposition Plan: discharge home in am, arrange for home BiPaP   Consultants:   Pulmonology   Cardiology  Procedures:     Anti-infectives    Start     Dose/Rate Route Frequency Ordered Stop   01/09/16 1045  doxycycline (VIBRA-TABS) tablet 100 mg     100 mg Oral Every 12 hours 01/09/16 1032     01/05/16 1800  ceFEPIme (MAXIPIME) 1 g in dextrose 5 % 50 mL IVPB  Status:  Discontinued     1 g 100 mL/hr over 30 Minutes Intravenous Every 12 hours 01/05/16 0909 01/09/16 1032   01/05/16 1800  vancomycin (VANCOCIN) IVPB 750 mg/150 ml premix  Status:  Discontinued     750 mg 150 mL/hr over 60 Minutes Intravenous Every 12 hours 01/05/16 0947 01/09/16 1032   01/05/16 0545  ceFEPIme (MAXIPIME) 2 g in dextrose 5 % 50 mL IVPB     2  g 100 mL/hr over 30 Minutes Intravenous  Once 01/05/16 0539 01/05/16 0716   01/05/16 0545  vancomycin (VANCOCIN) IVPB 1000 mg/200 mL premix     1,000 mg 200 mL/hr over 60 Minutes Intravenous  Once 01/05/16 0539 01/05/16 0816      Subjective: She continues to reports her breathing is better. No new problems reported  Objective: Vitals:   01/13/16 0752 01/13/16 0818 01/13/16 0838 01/13/16 0841  BP:  127/78    Pulse: (!) 121 (!) 112    Resp: (!) 22      Temp: 97.1 F (36.2 C)     TempSrc: Axillary     SpO2: 98%  96% 99%  Weight:      Height:        Intake/Output Summary (Last 24 hours) at 01/13/16 0952 Last data filed at 01/12/16 2300  Gross per 24 hour  Intake              520 ml  Output             1600 ml  Net            -1080 ml   Filed Weights   01/11/16 0500 01/12/16 0500 01/13/16 0500  Weight: 71.6 kg (157 lb 13.6 oz) 70.9 kg (156 lb 4.9 oz) 72.5 kg (159 lb 13.3 oz)    Examination:  General exam: Appears calm and comfortable, in nad. Respiratory system: no wheezes. Poor inspiratory effort. Equal chest rise, prolonged expiratory phase Cardiovascular system: S1 & S2 heard, irregular. No JVD, murmurs, rubs, gallops or clicks. No pedal edema. Gastrointestinal system: Abdomen is nondistended, soft and nontender. No organomegaly or masses felt. Normal bowel sounds heard. Central nervous system: Alert and oriented. No focal neurological deficits. Extremities: Symmetric 5 x 5 power. Skin: No rashes, lesions or ulcers Psychiatry: Judgement and insight appear normal. Mood & affect appropriate.   Data Reviewed: I have personally reviewed following labs and imaging studies  CBC:  Recent Labs Lab 01/07/16 0555 01/09/16 0502 01/12/16 0547  WBC 9.3 9.1 9.8  HGB 11.8* 12.5 11.3*  HCT 38.6 41.9 39.0  MCV 98.0 99.1 101.6*  PLT 230 208 150   Basic Metabolic Panel:  Recent Labs Lab 01/08/16 1346 01/09/16 2108 01/10/16 0912 01/12/16 0547 01/12/16 1411 01/12/16 2341  NA 135  --  133* 140 136 138  K 3.0*  --  3.2* 3.0* 2.5* 4.2  CL 88*  --  82* 80* 76* 83*  CO2 37*  --  44* >50* >50* 48*  GLUCOSE 425* 392* 326* 176* 465* 248*  BUN 31*  --  34* 42* 45* 43*  CREATININE 1.01*  --  1.09* 0.97 1.09* 0.99  CALCIUM 8.6*  --  8.7* 8.9 8.6* 9.0  MG  --   --   --  1.9  --   --    GFR: Estimated Creatinine Clearance: 46.5 mL/min (by C-G formula based on SCr of 0.99 mg/dL). Liver Function Tests: No results for input(s):  AST, ALT, ALKPHOS, BILITOT, PROT, ALBUMIN in the last 168 hours. No results for input(s): LIPASE, AMYLASE in the last 168 hours. No results for input(s): AMMONIA in the last 168 hours. Coagulation Profile: No results for input(s): INR, PROTIME in the last 168 hours. Cardiac Enzymes: No results for input(s): CKTOTAL, CKMB, CKMBINDEX, TROPONINI in the last 168 hours. BNP (last 3 results) No results for input(s): PROBNP in the last 8760 hours. HbA1C: No results for input(s): HGBA1C in the last 72 hours.  CBG:  Recent Labs Lab 01/12/16 1741 01/12/16 1922 01/13/16 0104 01/13/16 0529 01/13/16 0750  GLUCAP 310* 292* 198* 142* 124*   Lipid Profile: No results for input(s): CHOL, HDL, LDLCALC, TRIG, CHOLHDL, LDLDIRECT in the last 72 hours. Thyroid Function Tests: No results for input(s): TSH, T4TOTAL, FREET4, T3FREE, THYROIDAB in the last 72 hours. Anemia Panel: No results for input(s): VITAMINB12, FOLATE, FERRITIN, TIBC, IRON, RETICCTPCT in the last 72 hours. Sepsis Labs:  Recent Labs Lab 01/06/16 1051  LATICACIDVEN 2.1*    Recent Results (from the past 240 hour(s))  Culture, blood (Routine X 2) w Reflex to ID Panel     Status: None   Collection Time: 01/05/16  5:45 AM  Result Value Ref Range Status   Specimen Description BLOOD LEFT ANTECUBITAL  Final   Special Requests BOTTLES DRAWN AEROBIC AND ANAEROBIC 6CC EACH  Final   Culture NO GROWTH 5 DAYS  Final   Report Status 01/10/2016 FINAL  Final  Culture, blood (Routine X 2) w Reflex to ID Panel     Status: None   Collection Time: 01/05/16  5:51 AM  Result Value Ref Range Status   Specimen Description   Final    BLOOD BLOOD RIGHT HAND AEROBIC RH BLOOD LEFT HAND ANAEROBIC LH   Special Requests BOTTLES DRAWN AEROBIC AND ANAEROBIC 6CC EACH  Final   Culture NO GROWTH 5 DAYS  Final   Report Status 01/10/2016 FINAL  Final  Urine culture     Status: None   Collection Time: 01/05/16  6:11 AM  Result Value Ref Range Status    Specimen Description URINE, CATHETERIZED  Final   Special Requests NONE  Final   Culture NO GROWTH Performed at [facility]   Final   Report Status 01/06/2016 FINAL  Final  MRSA PCR Screening     Status: None   Collection Time: 01/05/16  8:57 AM  Result Value Ref Range Status   MRSA by PCR NEGATIVE NEGATIVE Final    Comment:        The GeneXpert MRSA Assay (FDA approved for NASAL specimens only), is one component of a comprehensive MRSA colonization surveillance program. It is not intended to diagnose MRSA infection nor to guide or monitor treatment for MRSA infections.   Culture, blood (routine x 2) Call MD if unable to obtain prior to antibiotics being given     Status: None   Collection Time: 01/05/16  9:54 AM  Result Value Ref Range Status   Specimen Description BLOOD RIGHT ANTECUBITAL  Final   Special Requests BOTTLES DRAWN AEROBIC AND ANAEROBIC 10 cc each  Final   Culture NO GROWTH 5 DAYS  Final   Report Status 01/10/2016 FINAL  Final  Culture, blood (routine x 2) Call MD if unable to obtain prior to antibiotics being given     Status: None   Collection Time: 01/05/16 10:03 AM  Result Value Ref Range Status   Specimen Description BLOOD BLOOD LEFT HAND  Final   Special Requests BOTTLES DRAWN AEROBIC AND ANAEROBIC 6 cc each  Final   Culture NO GROWTH 5 DAYS  Final   Report Status 01/10/2016 FINAL  Final  Culture, sputum-assessment     Status: None   Collection Time: 01/05/16  3:00 PM  Result Value Ref Range Status   Specimen Description EXPECTORATED SPUTUM  Final   Special Requests NONE  Final   Sputum evaluation   Final    THIS SPECIMEN IS ACCEPTABLE. RESPIRATORY CULTURE REPORT TO FOLLOW.  Performed at [facility]    Report Status 01/05/2016 FINAL  Final  Culture, respiratory (NON-Expectorated)     Status: None   Collection Time: 01/05/16  3:00 PM  Result Value Ref Range Status   Specimen Description EXPECTORATED SPUTUM  Final   Special Requests  NONE  Final   Gram Stain   Final    ABUNDANT WBC PRESENT, PREDOMINANTLY PMN ABUNDANT GRAM POSITIVE COCCI IN PAIRS IN CLUSTERS MODERATE GRAM NEGATIVE RODS FEW GRAM NEGATIVE COCCOBACILLI FEW GRAM POSITIVE RODS FEW GRAM VARIABLE ROD FEW SQUAMOUS EPITHELIAL CELLS PRESENT FEW GRAM NEGATIVE DIPLOCOCCI    Culture   Final    Consistent with normal respiratory flora. Performed at [facility]    Report Status 01/08/2016 FINAL  Final     Radiology Studies: No results found.   Scheduled Meds: . apixaban  5 mg Oral BID  . arformoterol  15 mcg Nebulization BID  . budesonide (PULMICORT) nebulizer solution  0.5 mg Nebulization BID  . diltiazem  90 mg Oral Q6H  . doxycycline  100 mg Oral Q12H  . furosemide  40 mg Intravenous Daily  . guaiFENesin  1,200 mg Oral BID  . insulin aspart  0-20 Units Subcutaneous TID WC  . insulin aspart  0-5 Units Subcutaneous QHS  . insulin glargine  20 Units Subcutaneous BID  . ipratropium  0.5 mg Nebulization Q6H  . levalbuterol  0.63 mg Nebulization Q6H  . levothyroxine  88 mcg Oral QAC breakfast  . methylPREDNISolone (SOLU-MEDROL) injection  60 mg Intravenous Q24H  . metoprolol tartrate  50 mg Oral BID  . pantoprazole  40 mg Oral Daily  . rosuvastatin  10 mg Oral QHS  . sodium chloride flush  3 mL Intravenous Q12H  . spironolactone  12.5 mg Oral Daily   Continuous Infusions:   LOS: 8 days   Time spent: > 35 mins  Signature  Susa Raring K M.D on 01/13/2016 at 9:52 AM  Between 7am to 7pm - Pager - [phone], After 7pm go to [email or web address] - password [facility]  [facility]  - Office  [phone]

## 2016-01-13 NOTE — Progress Notes (Signed)
Patient Name: [name] [name] Date of Encounter: 01/13/2016  Primary Cardiologist: Dr. Leroy LibmanMcDowell  Hospital Problem List     Active Problems:   Essential hypertension, benign   COPD with acute exacerbation (HCC)   Atrial fibrillation with RVR (HCC)   Chronic anticoagulation-CHADs VASc=6   Acute on chronic respiratory failure with hypoxia (HCC)   Uncontrolled type 2 diabetes mellitus with complication (HCC)   HCAP (healthcare-associated pneumonia)   Immunoglobulin G deficiency (HCC)     Subjective   Alert, no cardiac complaints  Inpatient Medications    Scheduled Meds: . apixaban  5 mg Oral BID  . arformoterol  15 mcg Nebulization BID  . budesonide (PULMICORT) nebulizer solution  0.5 mg Nebulization BID  . diltiazem  90 mg Oral Q6H  . doxycycline  100 mg Oral Q12H  . furosemide  40 mg Intravenous Daily  . guaiFENesin  1,200 mg Oral BID  . insulin aspart  0-20 Units Subcutaneous TID WC  . insulin aspart  0-5 Units Subcutaneous QHS  . insulin glargine  20 Units Subcutaneous BID  . ipratropium  0.5 mg Nebulization Q6H  . levalbuterol  0.63 mg Nebulization Q6H  . levothyroxine  88 mcg Oral QAC breakfast  . methylPREDNISolone (SOLU-MEDROL) injection  60 mg Intravenous Q24H  . metoprolol tartrate  50 mg Oral BID  . pantoprazole  40 mg Oral Daily  . rosuvastatin  10 mg Oral QHS  . sodium chloride flush  3 mL Intravenous Q12H  . spironolactone  12.5 mg Oral Daily   Continuous Infusions:  PRN Meds: sodium chloride, ALPRAZolam, alum & mag hydroxide-simeth, HYDROcodone-acetaminophen, levalbuterol, meclizine, sodium chloride flush   Vital Signs    Vitals:   01/13/16 0000 01/13/16 0400 01/13/16 0500 01/13/16 0752  BP: 115/60     Pulse: 63   (!) 121  Resp: (!) 22   (!) 22  Temp: 98.1 F (36.7 C) 97 F (36.1 C)  97.1 F (36.2 C)  TempSrc: Oral Axillary  Axillary  SpO2: 98%   98%  Weight:   159 lb 13.3 oz (72.5 kg)   Height:        Intake/Output Summary (Last 24  hours) at 01/13/16 0807 Last data filed at 01/12/16 2300  Gross per 24 hour  Intake              760 ml  Output             1600 ml  Net             -840 ml   Filed Weights   01/11/16 0500 01/12/16 0500 01/13/16 0500  Weight: 157 lb 13.6 oz (71.6 kg) 156 lb 4.9 oz (70.9 kg) 159 lb 13.3 oz (72.5 kg)    Physical Exam   GEN: Well nourished, well developed, in no acute distress.  Neck: Supple, no JVD, carotid bruits, or masses. Cardiac: irreg irreg no murmurs, rubs, or gallops. No clubbing, cyanosis, edema.  Radials/DP/PT 2+ and equal bilaterally.  Respiratory:  Decreased breath sounds with scattered rhonchi. GI: Soft, nontender, nondistended, BS + x 4. MS: no deformity or atrophy. Skin: warm and dry, no rash. Psych: AAOx3.  Normal affect.  Labs    CBC  Recent Labs  01/12/16 0547  WBC 9.8  HGB 11.3*  HCT 39.0  MCV 101.6*  PLT 150   Basic Metabolic Panel  Recent Labs  01/12/16 0547 01/12/16 1411 01/12/16 2341  NA 140 136 138  K 3.0* 2.5* 4.2  CL  80* 76* 83*  CO2 >50* >50* 48*  GLUCOSE 176* 465* 248*  BUN 42* 45* 43*  CREATININE 0.97 1.09* 0.99  CALCIUM 8.9 8.6* 9.0  MG 1.9  --   --    Liver Function Tests No results for input(s): AST, ALT, ALKPHOS, BILITOT, PROT, ALBUMIN in the last 72 hours. No results for input(s): LIPASE, AMYLASE in the last 72 hours. Cardiac Enzymes No results for input(s): CKTOTAL, CKMB, CKMBINDEX, TROPONINI in the last 72 hours. BNP Invalid input(s): POCBNP D-Dimer No results for input(s): DDIMER in the last 72 hours. Hemoglobin A1C No results for input(s): HGBA1C in the last 72 hours. Fasting Lipid Panel No results for input(s): CHOL, HDL, LDLCALC, TRIG, CHOLHDL, LDLDIRECT in the last 72 hours. Thyroid Function Tests No results for input(s): TSH, T4TOTAL, T3FREE, THYROIDAB in the last 72 hours.  Invalid input(s): FREET3  Telemetry    afib 108-120/m- Personally Reviewed  ECG      Radiology    No results  found.  Cardiac Studies  Echo 01/11/16: Study Conclusions   - Left ventricle: The cavity size was normal. Systolic function was   normal. The estimated ejection fraction was in the range of 60%   to 65%. Wall motion was normal; there were no regional wall   motion abnormalities. The study is not technically sufficient to   allow evaluation of LV diastolic function. - Mitral valve: There was mild regurgitation. - Left atrium: The atrium was mildly dilated. - Right atrium: The atrium was mildly dilated. - Atrial septum: No defect or patent foramen ovale was identified. - Pulmonary arteries: PA peak pressure: 69 mm Hg (S).    Echocardiogram 09/08/2015: Study Conclusions   - Left ventricle: The cavity size was normal. Wall thickness was   normal. Systolic function was normal. The estimated ejection   fraction was in the range of 60% to 65%. Wall motion was normal;   there were no regional wall motion abnormalities. - Left atrium: The atrium was mildly dilated. - Right ventricle: The cavity size was mildly dilated. Systolic   function was mildly reduced. - Right atrium: The atrium was mildly to moderately dilated.     Lexiscan Cardiolite 08/22/2014:  Atrial fibrillation present at baseline. There was no ST segment deviation noted during stress.  There is a fixed defect present in the mid inferoseptal location.  Nuclear stress EF: 44%.  Soft tissue attenuation without clear evidence of scar or ischemia. Intermediate risk study based on LVEF 44%, although this looks to be underestimated. Suggest echocardiogram to further clarify.   Carotid Dopplers 11/12/2014: Stable 1-39% bilateral ICA stenoses.   Patient Profile     75 y/o female with chronic hypoxic respiratory failure, COPD, and diastolic CHF, with PAF on Eliquis, and CAD, admitted with COPD exacerbation and BiPAP dependent at night. Frequent hospitalizations for same.   Assessment & Plan    1. Atrial fib: On po  diltiazem 90 mg q 6 and increased dose of metoprolol 50 mg BID.  Heart rate still 108-120/m. On steroids for COPD exacerbation which may be contributing to elevated HR in conjunction with significant deconditioning. Continue apixaban. No bleeding issues noted.    2. Hypertension: BP is stable. No changes on medications planned for now.    3. Hypokalemia: On spironolactone and daily K 10 mEq po. Normal today.   4. Chronic respiratory failure: Requires BiPAP at night. On O2 2/liters during the day.    5, Deconditioning: Physical therapy is working with her.  Signed, Jacolyn Reedy, PA-C  01/13/2016, 8:07 AM   NO further cardiology recs not a candidate for AV node modification and would not Chase better rate control given severity of COPD  Exp wheezing on exam   Charlton Haws

## 2016-01-13 NOTE — Progress Notes (Signed)
Subjective: She says she feels a little better. Discussed her situation with her daughter by phone yesterday. She is still requiring BiPAP at night. No other new complaints. No chest pain. No nausea vomiting diarrhea abdominal pain urinary symptoms  Objective: Vital signs in last 24 hours: Temp:  [97 F (36.1 C)-98.9 F (37.2 C)] 97 F (36.1 C) (11/15 0400) Pulse Rate:  [45-120] 63 (11/15 0000) Resp:  [18-28] 22 (11/15 0000) BP: (106-157)/(54-108) 115/60 (11/15 0000) SpO2:  [91 %-100 %] 98 % (11/15 0000) Weight:  [72.5 kg (159 lb 13.3 oz)] 72.5 kg (159 lb 13.3 oz) (11/15 0500) Weight change: 1.6 kg (3 lb 8.4 oz) Last BM Date: 01/08/16  Intake/Output from previous day: 11/14 0701 - 11/15 0700 In: 760 [P.O.:720; I.V.:40] Out: 1600 [Urine:1600]  PHYSICAL EXAM General appearance: alert, cooperative, mild distress and On BiPAP Resp: rhonchi bilaterally Cardio: irregularly irregular rhythm GI: soft, non-tender; bowel sounds normal; no masses,  no organomegaly Extremities: Trace edema Skin warm and dry. Pupils react. Mucous membranes are moist  Lab Results:  Results for orders placed or performed during the hospital encounter of 01/05/16 (from the past 48 hour(s))  Glucose, capillary     Status: Abnormal   Collection Time: 01/11/16  7:55 AM  Result Value Ref Range   Glucose-Capillary 194 (H) 65 - 99 mg/dL   Comment 1 Notify RN   Glucose, capillary     Status: Abnormal   Collection Time: 01/11/16 11:54 AM  Result Value Ref Range   Glucose-Capillary 172 (H) 65 - 99 mg/dL   Comment 1 Notify RN   Glucose, capillary     Status: Abnormal   Collection Time: 01/11/16  4:51 PM  Result Value Ref Range   Glucose-Capillary 268 (H) 65 - 99 mg/dL   Comment 1 Notify RN   Glucose, capillary     Status: Abnormal   Collection Time: 01/11/16  9:46 PM  Result Value Ref Range   Glucose-Capillary 357 (H) 65 - 99 mg/dL  Basic metabolic panel     Status: Abnormal   Collection Time: 01/12/16   5:47 AM  Result Value Ref Range   Sodium 140 135 - 145 mmol/L    Comment: DELTA CHECK NOTED   Potassium 3.0 (L) 3.5 - 5.1 mmol/L   Chloride 80 (L) 101 - 111 mmol/L   CO2 >50 (H) 22 - 32 mmol/L   Glucose, Bld 176 (H) 65 - 99 mg/dL   BUN 42 (H) 6 - 20 mg/dL   Creatinine, Ser 0.97 0.44 - 1.00 mg/dL   Calcium 8.9 8.9 - 10.3 mg/dL   GFR calc non Af Amer 56 (L) >60 mL/min   GFR calc Af Amer >60 >60 mL/min    Comment: (NOTE) The eGFR has been calculated using the CKD EPI equation. This calculation has not been validated in all clinical situations. eGFR's persistently <60 mL/min signify possible Chronic Kidney Disease.   CBC     Status: Abnormal   Collection Time: 01/12/16  5:47 AM  Result Value Ref Range   WBC 9.8 4.0 - 10.5 K/uL   RBC 3.84 (L) 3.87 - 5.11 MIL/uL   Hemoglobin 11.3 (L) 12.0 - 15.0 g/dL   HCT 39.0 36.0 - 46.0 %   MCV 101.6 (H) 78.0 - 100.0 fL   MCH 29.4 26.0 - 34.0 pg   MCHC 29.0 (L) 30.0 - 36.0 g/dL   RDW 14.6 11.5 - 15.5 %   Platelets 150 150 - 400 K/uL  Magnesium  Status: None   Collection Time: 01/12/16  5:47 AM  Result Value Ref Range   Magnesium 1.9 1.7 - 2.4 mg/dL  Glucose, capillary     Status: Abnormal   Collection Time: 01/12/16  7:50 AM  Result Value Ref Range   Glucose-Capillary 165 (H) 65 - 99 mg/dL   Comment 1 Notify RN    Comment 2 Document in Chart   Glucose, capillary     Status: Abnormal   Collection Time: 01/12/16 11:22 AM  Result Value Ref Range   Glucose-Capillary 541 (HH) 65 - 99 mg/dL   Comment 1 Notify RN    Comment 2 Document in Chart   Glucose, capillary     Status: Abnormal   Collection Time: 01/12/16  1:11 PM  Result Value Ref Range   Glucose-Capillary 527 (HH) 65 - 99 mg/dL   Comment 1 Notify RN    Comment 2 Document in Chart   Basic metabolic panel     Status: Abnormal   Collection Time: 01/12/16  2:11 PM  Result Value Ref Range   Sodium 136 135 - 145 mmol/L   Potassium 2.5 (LL) 3.5 - 5.1 mmol/L    Comment: CRITICAL  RESULT CALLED TO, READ BACK BY AND VERIFIED WITH: MCDANIELS,M AT 1455 ON 01/12/2016 BY ISLEY,B    Chloride 76 (L) 101 - 111 mmol/L   CO2 >50 (H) 22 - 32 mmol/L   Glucose, Bld 465 (H) 65 - 99 mg/dL   BUN 45 (H) 6 - 20 mg/dL   Creatinine, Ser 1.09 (H) 0.44 - 1.00 mg/dL   Calcium 8.6 (L) 8.9 - 10.3 mg/dL   GFR calc non Af Amer 49 (L) >60 mL/min   GFR calc Af Amer 57 (L) >60 mL/min    Comment: (NOTE) The eGFR has been calculated using the CKD EPI equation. This calculation has not been validated in all clinical situations. eGFR's persistently <60 mL/min signify possible Chronic Kidney Disease.   Glucose, capillary     Status: Abnormal   Collection Time: 01/12/16  3:09 PM  Result Value Ref Range   Glucose-Capillary 421 (H) 65 - 99 mg/dL  Glucose, capillary     Status: Abnormal   Collection Time: 01/12/16  4:15 PM  Result Value Ref Range   Glucose-Capillary 340 (H) 65 - 99 mg/dL   Comment 1 Notify RN    Comment 2 Document in Chart   Glucose, capillary     Status: Abnormal   Collection Time: 01/12/16  5:41 PM  Result Value Ref Range   Glucose-Capillary 310 (H) 65 - 99 mg/dL  Glucose, capillary     Status: Abnormal   Collection Time: 01/12/16  7:22 PM  Result Value Ref Range   Glucose-Capillary 292 (H) 65 - 99 mg/dL  Basic metabolic panel     Status: Abnormal   Collection Time: 01/12/16 11:41 PM  Result Value Ref Range   Sodium 138 135 - 145 mmol/L   Potassium 4.2 3.5 - 5.1 mmol/L    Comment: DELTA CHECK NOTED   Chloride 83 (L) 101 - 111 mmol/L   CO2 48 (H) 22 - 32 mmol/L   Glucose, Bld 248 (H) 65 - 99 mg/dL   BUN 43 (H) 6 - 20 mg/dL   Creatinine, Ser 0.99 0.44 - 1.00 mg/dL   Calcium 9.0 8.9 - 10.3 mg/dL   GFR calc non Af Amer 55 (L) >60 mL/min   GFR calc Af Amer >60 >60 mL/min    Comment: (NOTE) The eGFR  has been calculated using the CKD EPI equation. This calculation has not been validated in all clinical situations. eGFR's persistently <60 mL/min signify possible  Chronic Kidney Disease.    Anion gap 7 5 - 15  Glucose, capillary     Status: Abnormal   Collection Time: 01/13/16  1:04 AM  Result Value Ref Range   Glucose-Capillary 198 (H) 65 - 99 mg/dL  Glucose, capillary     Status: Abnormal   Collection Time: 01/13/16  5:29 AM  Result Value Ref Range   Glucose-Capillary 142 (H) 65 - 99 mg/dL    ABGS No results for input(s): PHART, PO2ART, TCO2, HCO3 in the last 72 hours.  Invalid input(s): PCO2 CULTURES Recent Results (from the past 240 hour(s))  Culture, blood (Routine X 2) w Reflex to ID Panel     Status: None   Collection Time: 01/05/16  5:45 AM  Result Value Ref Range Status   Specimen Description BLOOD LEFT ANTECUBITAL  Final   Special Requests BOTTLES DRAWN AEROBIC AND ANAEROBIC [place]  Final   Culture NO GROWTH 5 DAYS  Final   Report Status 01/10/2016 FINAL  Final  Culture, blood (Routine X 2) w Reflex to ID Panel     Status: None   Collection Time: 01/05/16  5:51 AM  Result Value Ref Range Status   Specimen Description   Final    BLOOD BLOOD RIGHT HAND AEROBIC RH BLOOD LEFT HAND ANAEROBIC LH   Special Requests BOTTLES DRAWN AEROBIC AND ANAEROBIC [place]  Final   Culture NO GROWTH 5 DAYS  Final   Report Status 01/10/2016 FINAL  Final  Urine culture     Status: None   Collection Time: 01/05/16  6:11 AM  Result Value Ref Range Status   Specimen Description URINE, CATHETERIZED  Final   Special Requests NONE  Final   Culture NO GROWTH Performed at [facility] [place] [facility]   Final   Report Status 01/06/2016 FINAL  Final  MRSA PCR Screening     Status: None   Collection Time: 01/05/16  8:57 AM  Result Value Ref Range Status   MRSA by PCR NEGATIVE NEGATIVE Final    Comment:        The GeneXpert MRSA Assay (FDA approved for NASAL specimens only), is one component of a comprehensive MRSA colonization surveillance program. It is not intended to diagnose MRSA infection nor to guide or monitor treatment for MRSA infections.    Culture, blood (routine x 2) Call MD if unable to obtain prior to antibiotics being given     Status: None   Collection Time: 01/05/16  9:54 AM  Result Value Ref Range Status   Specimen Description BLOOD RIGHT ANTECUBITAL  Final   Special Requests BOTTLES DRAWN AEROBIC AND ANAEROBIC 10 cc each  Final   Culture NO GROWTH 5 DAYS  Final   Report Status 01/10/2016 FINAL  Final  Culture, blood (routine x 2) Call MD if unable to obtain prior to antibiotics being given     Status: None   Collection Time: 01/05/16 10:03 AM  Result Value Ref Range Status   Specimen Description BLOOD BLOOD LEFT HAND  Final   Special Requests BOTTLES DRAWN AEROBIC AND ANAEROBIC 6 cc each  Final   Culture NO GROWTH 5 DAYS  Final   Report Status 01/10/2016 FINAL  Final  Culture, sputum-assessment     Status: None   Collection Time: 01/05/16  3:00 PM  Result Value Ref Range Status  Specimen Description EXPECTORATED SPUTUM  Final   Special Requests NONE  Final   Sputum evaluation   Final    THIS SPECIMEN IS ACCEPTABLE. RESPIRATORY CULTURE REPORT TO FOLLOW. Performed at [facility]    Report Status 01/05/2016 FINAL  Final  Culture, respiratory (NON-Expectorated)     Status: None   Collection Time: 01/05/16  3:00 PM  Result Value Ref Range Status   Specimen Description EXPECTORATED SPUTUM  Final   Special Requests NONE  Final   Gram Stain   Final    ABUNDANT WBC PRESENT, PREDOMINANTLY PMN ABUNDANT GRAM POSITIVE COCCI IN PAIRS IN CLUSTERS MODERATE GRAM NEGATIVE RODS FEW GRAM NEGATIVE COCCOBACILLI FEW GRAM POSITIVE RODS FEW GRAM VARIABLE ROD FEW SQUAMOUS EPITHELIAL CELLS PRESENT FEW GRAM NEGATIVE DIPLOCOCCI    Culture   Final    Consistent with normal respiratory flora. Performed at [facility]    Report Status 01/08/2016 FINAL  Final   Studies/Results: No results found.  Medications:  Prior to Admission:  Prescriptions Prior to Admission  Medication Sig Dispense Refill Last Dose   . ALPRAZolam (XANAX) 0.5 MG tablet Take 0.5 mg by mouth 3 (three) times daily as needed for anxiety.   01/04/2016 at Unknown time  . dexlansoprazole (DEXILANT) 60 MG capsule Take 60 mg by mouth daily.     01/04/2016 at Unknown time  . diltiazem (CARDIZEM CD) 360 MG 24 hr capsule Take 1 capsule (360 mg total) by mouth daily. 30 capsule 0 01/04/2016 at Unknown time  . ELIQUIS 5 MG TABS tablet TAKE 1 TABLET BY MOUTH TWICE DAILY 60 tablet 5 01/04/2016 at 1700  . Fluticasone Furoate-Vilanterol (BREO ELLIPTA) 100-25 MCG/INH AEPB Inhale 1 puff into the lungs daily as needed (wheezing/shortness of breath).    unknown  . furosemide (LASIX) 40 MG tablet Take 1 tablet (40 mg total) by mouth daily. 30 tablet 1 01/04/2016 at Unknown time  . glipiZIDE (GLUCOTROL) 10 MG tablet Take 1 tablet (10 mg total) by mouth 2 (two) times daily before a meal. Do not take glipizide if blood sugar falls below 125.   01/04/2016 at Unknown time  . HYDROcodone-acetaminophen (NORCO/VICODIN) 5-325 MG tablet Take 1 tablet by mouth 2 (two) times daily as needed for moderate pain.   unknown  . insulin glargine (LANTUS) 100 UNIT/ML injection Inject 0.15 mLs (15 Units total) into the skin 2 (two) times daily. 10 mL 11 01/04/2016 at Unknown time  . Insulin Syringes, Disposable, U-100 1 ML MISC 15 Units by Does not apply route 2 (two) times daily. 200 each 6 01/04/2016 at Unknown time  . levalbuterol (XOPENEX) 0.63 MG/3ML nebulizer solution Take 3 mLs (0.63 mg total) by nebulization every 3 (three) hours as needed for wheezing or shortness of breath. 3 mL 12 01/04/2016 at Unknown time  . levothyroxine (SYNTHROID, LEVOTHROID) 88 MCG tablet Take 88 mcg by mouth daily before breakfast.   01/04/2016 at Unknown time  . meclizine (ANTIVERT) 25 MG tablet Take 12.5 mg by mouth daily as needed for dizziness.   unknown  . metoprolol tartrate (LOPRESSOR) 25 MG tablet Take 25 mg by mouth 2 (two) times daily.   01/04/2016 at 1700  . nitroGLYCERIN (NITROSTAT) 0.4  MG SL tablet Place 1 tablet (0.4 mg total) under the tongue every 5 (five) minutes x 3 doses as needed for chest pain. For severe chest pain 25 tablet 3 unknown  . OXYGEN Inhale 2-3 L into the lungs continuous.   01/04/2016 at Unknown time  .  rosuvastatin (CRESTOR) 10 MG tablet Take 1 tablet (10 mg total) by mouth daily. (Patient taking differently: Take 10 mg by mouth at bedtime. ) 30 tablet 6 01/04/2016 at Unknown time  . Vitamin D, Ergocalciferol, (DRISDOL) 50000 units CAPS capsule Take 50,000 Units by mouth every 7 (seven) days. Takes on Mondays.   Past Week at Unknown time  . [DISCONTINUED] levofloxacin (LEVAQUIN) 500 MG tablet Take 1 tablet (500 mg total) by mouth daily. Antibiotic. Starting 12/12/15 take 1 tablet daily until completed. 4 tablet 0   . [DISCONTINUED] predniSONE (DELTASONE) 10 MG tablet Starting 12/12/15, take 6 tablets daily for 1 day; then 5 tablets the next day for 1 day; then 4 tablets the next day for 1 day; then 3 tablets the next day for 1 day; then 2 tablets the next day for 1 day; then 1 tablet the next day for 1 day; then stop. 21 tablet 0    Scheduled: . apixaban  5 mg Oral BID  . arformoterol  15 mcg Nebulization BID  . budesonide (PULMICORT) nebulizer solution  0.5 mg Nebulization BID  . diltiazem  90 mg Oral Q6H  . doxycycline  100 mg Oral Q12H  . furosemide  40 mg Intravenous Daily  . guaiFENesin  1,200 mg Oral BID  . insulin aspart  0-20 Units Subcutaneous TID WC  . insulin aspart  0-5 Units Subcutaneous QHS  . insulin glargine  20 Units Subcutaneous BID  . ipratropium  0.5 mg Nebulization Q6H  . levalbuterol  0.63 mg Nebulization Q6H  . levothyroxine  88 mcg Oral QAC breakfast  . methylPREDNISolone (SOLU-MEDROL) injection  60 mg Intravenous Q24H  . metoprolol tartrate  50 mg Oral BID  . pantoprazole  40 mg Oral Daily  . rosuvastatin  10 mg Oral QHS  . sodium chloride flush  3 mL Intravenous Q12H  . spironolactone  12.5 mg Oral Daily    Continuous:  JKD:TOIZTI chloride, ALPRAZolam, alum & mag hydroxide-simeth, HYDROcodone-acetaminophen, levalbuterol, meclizine, sodium chloride flush  Assesment: She was admitted with COPD exacerbation and acute on chronic hypoxic/hypercapnic respiratory failure. She has healthcare associated pneumonia. She is slowly improving. She is still requiring BiPAP at night. She had atrial fib with RVR and that has improved with treatment. Her breathing is improving. She may have some element of diastolic heart failure but echocardiogram was not able to really assess that. Active Problems:   Essential hypertension, benign   COPD with acute exacerbation (HCC)   Atrial fibrillation with RVR (HCC)   Chronic anticoagulation-CHADs VASc=6   Acute on chronic respiratory failure with hypoxia ([place])   Uncontrolled type 2 diabetes mellitus with complication ([place])   HCAP (healthcare-associated pneumonia)    Plan: Continue current treatments. She is slowly improving. She may require BiPAP at home.    LOS: 8 days   Nillie Bartolotta L 01/13/2016, 7:24 AM

## 2016-01-13 NOTE — Progress Notes (Signed)
01/13/16  1420  Patient sat in chair from 11am to 2pm..  Patient's vital sign stayed within normal limits. BP 119/79  HR 93  R 21 95% HFNC 5L.

## 2016-01-13 NOTE — Progress Notes (Signed)
Dr. Clearence PedSchorr paged about patient's HR increasing to 155, but not sustaining. Paged second time for HR increasing to 164 and 169, but not sustaining. Patient asymptomatic. VS per flow sheet.

## 2016-01-14 LAB — GLUCOSE, CAPILLARY
Glucose-Capillary: 124 mg/dL — ABNORMAL HIGH (ref 65–99)
Glucose-Capillary: 182 mg/dL — ABNORMAL HIGH (ref 65–99)
Glucose-Capillary: 225 mg/dL — ABNORMAL HIGH (ref 65–99)
Glucose-Capillary: 247 mg/dL — ABNORMAL HIGH (ref 65–99)
Glucose-Capillary: 350 mg/dL — ABNORMAL HIGH (ref 65–99)
Glucose-Capillary: 359 mg/dL — ABNORMAL HIGH (ref 65–99)

## 2016-01-14 LAB — BLOOD GAS, ARTERIAL
ACID-BASE EXCESS: 24.6 mmol/L — AB (ref 0.0–2.0)
BICARBONATE: 47.8 mmol/L — AB (ref 20.0–28.0)
Drawn by: 27733
O2 CONTENT: 2 L/min
O2 Saturation: 92.2 %
PCO2 ART: 61.1 mmHg — AB (ref 32.0–48.0)
PH ART: 7.52 — AB (ref 7.350–7.450)
PO2 ART: 62 mmHg — AB (ref 83.0–108.0)
Patient temperature: 37

## 2016-01-14 MED ORDER — FUROSEMIDE 10 MG/ML IJ SOLN
40.0000 mg | Freq: Two times a day (BID) | INTRAMUSCULAR | Status: DC
  Administered 2016-01-14 – 2016-01-15 (×3): 40 mg via INTRAVENOUS
  Filled 2016-01-14 (×3): qty 4

## 2016-01-14 NOTE — Progress Notes (Signed)
Subjective: She says she feels okay. No new complaints. She had elevated heart rate during the night. She has atrial fib with RVR on admission. At baseline she has COPD. She has been using BiPAP at night and there was concern that she may need that at home but she has not agreed to allow a blood gas to be done so we don't have any documentation that she actually needs to be on BiPAP. I'm not sure she is going to qualify.  Objective: Vital signs in last 24 hours: Temp:  [97.5 F (36.4 C)-98.3 F (36.8 C)] 98.3 F (36.8 C) (11/16 0535) Pulse Rate:  [48-128] 75 (11/16 0535) Resp:  [12-28] 21 (11/16 0535) BP: (119-153)/(49-91) 153/91 (11/16 0535) SpO2:  [82 %-100 %] 96 % (11/16 0648) Weight:  [71.2 kg (156 lb 15.5 oz)] 71.2 kg (156 lb 15.5 oz) (11/15 1914) Weight change: -1.3 kg (-2 lb 13.9 oz) Last BM Date: 01/12/16  Intake/Output from previous day: 11/15 0701 - 11/16 0700 In: 720 [P.O.:720] Out: 1500 [Urine:1500]  PHYSICAL EXAM General appearance: alert, cooperative and mild distress Resp: rhonchi bilaterally Cardio: irregularly irregular rhythm GI: soft, non-tender; bowel sounds normal; no masses,  no organomegaly Extremities: extremities normal, atraumatic, no cyanosis or edema Skin warm and dry. Mucous membranes moist. She has dentures. Her pupils react.  Lab Results:  Results for orders placed or performed during the hospital encounter of 01/05/16 (from the past 48 hour(s))  Glucose, capillary     Status: Abnormal   Collection Time: 01/12/16 11:22 AM  Result Value Ref Range   Glucose-Capillary 541 (HH) 65 - 99 mg/dL   Comment 1 Notify RN    Comment 2 Document in Chart   Glucose, capillary     Status: Abnormal   Collection Time: 01/12/16  1:11 PM  Result Value Ref Range   Glucose-Capillary 527 (HH) 65 - 99 mg/dL   Comment 1 Notify RN    Comment 2 Document in Chart   Basic metabolic panel     Status: Abnormal   Collection Time: 01/12/16  2:11 PM  Result Value Ref Range    Sodium 136 135 - 145 mmol/L   Potassium 2.5 (LL) 3.5 - 5.1 mmol/L    Comment: CRITICAL RESULT CALLED TO, READ BACK BY AND VERIFIED WITH: MCDANIELS,M AT 1455 ON 01/12/2016 BY ISLEY,B    Chloride 76 (L) 101 - 111 mmol/L   CO2 >50 (H) 22 - 32 mmol/L   Glucose, Bld 465 (H) 65 - 99 mg/dL   BUN 45 (H) 6 - 20 mg/dL   Creatinine, Ser 1.09 (H) 0.44 - 1.00 mg/dL   Calcium 8.6 (L) 8.9 - 10.3 mg/dL   GFR calc non Af Amer 49 (L) >60 mL/min   GFR calc Af Amer 57 (L) >60 mL/min    Comment: (NOTE) The eGFR has been calculated using the CKD EPI equation. This calculation has not been validated in all clinical situations. eGFR's persistently <60 mL/min signify possible Chronic Kidney Disease.   Glucose, capillary     Status: Abnormal   Collection Time: 01/12/16  3:09 PM  Result Value Ref Range   Glucose-Capillary 421 (H) 65 - 99 mg/dL  Glucose, capillary     Status: Abnormal   Collection Time: 01/12/16  4:15 PM  Result Value Ref Range   Glucose-Capillary 340 (H) 65 - 99 mg/dL   Comment 1 Notify RN    Comment 2 Document in Chart   Glucose, capillary     Status: Abnormal  Collection Time: 01/12/16  5:41 PM  Result Value Ref Range   Glucose-Capillary 310 (H) 65 - 99 mg/dL  Glucose, capillary     Status: Abnormal   Collection Time: 01/12/16  7:22 PM  Result Value Ref Range   Glucose-Capillary 292 (H) 65 - 99 mg/dL  Basic metabolic panel     Status: Abnormal   Collection Time: 01/12/16 11:41 PM  Result Value Ref Range   Sodium 138 135 - 145 mmol/L   Potassium 4.2 3.5 - 5.1 mmol/L    Comment: DELTA CHECK NOTED   Chloride 83 (L) 101 - 111 mmol/L   CO2 48 (H) 22 - 32 mmol/L   Glucose, Bld 248 (H) 65 - 99 mg/dL   BUN 43 (H) 6 - 20 mg/dL   Creatinine, Ser 0.99 0.44 - 1.00 mg/dL   Calcium 9.0 8.9 - 10.3 mg/dL   GFR calc non Af Amer 55 (L) >60 mL/min   GFR calc Af Amer >60 >60 mL/min    Comment: (NOTE) The eGFR has been calculated using the CKD EPI equation. This calculation has not been  validated in all clinical situations. eGFR's persistently <60 mL/min signify possible Chronic Kidney Disease.    Anion gap 7 5 - 15  Glucose, capillary     Status: Abnormal   Collection Time: 01/13/16  1:04 AM  Result Value Ref Range   Glucose-Capillary 198 (H) 65 - 99 mg/dL  Glucose, capillary     Status: Abnormal   Collection Time: 01/13/16  5:29 AM  Result Value Ref Range   Glucose-Capillary 142 (H) 65 - 99 mg/dL  Glucose, capillary     Status: Abnormal   Collection Time: 01/13/16  7:50 AM  Result Value Ref Range   Glucose-Capillary 124 (H) 65 - 99 mg/dL  Glucose, capillary     Status: Abnormal   Collection Time: 01/13/16 11:25 AM  Result Value Ref Range   Glucose-Capillary 260 (H) 65 - 99 mg/dL  Glucose, capillary     Status: Abnormal   Collection Time: 01/13/16  4:09 PM  Result Value Ref Range   Glucose-Capillary 283 (H) 65 - 99 mg/dL   Comment 1 Notify RN    Comment 2 Document in Chart   Glucose, capillary     Status: Abnormal   Collection Time: 01/13/16  8:50 PM  Result Value Ref Range   Glucose-Capillary 272 (H) 65 - 99 mg/dL  Glucose, capillary     Status: Abnormal   Collection Time: 01/14/16 12:46 AM  Result Value Ref Range   Glucose-Capillary 247 (H) 65 - 99 mg/dL   Comment 1 Notify RN    Comment 2 Document in Chart   Glucose, capillary     Status: Abnormal   Collection Time: 01/14/16  4:23 AM  Result Value Ref Range   Glucose-Capillary 182 (H) 65 - 99 mg/dL   Comment 1 Notify RN    Comment 2 Document in Chart     ABGS No results for input(s): PHART, PO2ART, TCO2, HCO3 in the last 72 hours.  Invalid input(s): PCO2 CULTURES Recent Results (from the past 240 hour(s))  Culture, blood (Routine X 2) w Reflex to ID Panel     Status: None   Collection Time: 01/05/16  5:45 AM  Result Value Ref Range Status   Specimen Description BLOOD LEFT ANTECUBITAL  Final   Special Requests BOTTLES DRAWN AEROBIC AND ANAEROBIC [place]  Final   Culture NO GROWTH 5 DAYS   Final   Report  Status 01/10/2016 FINAL  Final  Culture, blood (Routine X 2) w Reflex to ID Panel     Status: None   Collection Time: 01/05/16  5:51 AM  Result Value Ref Range Status   Specimen Description   Final    BLOOD BLOOD RIGHT HAND AEROBIC RH BLOOD LEFT HAND ANAEROBIC LH   Special Requests BOTTLES DRAWN AEROBIC AND ANAEROBIC [place]  Final   Culture NO GROWTH 5 DAYS  Final   Report Status 01/10/2016 FINAL  Final  Urine culture     Status: None   Collection Time: 01/05/16  6:11 AM  Result Value Ref Range Status   Specimen Description URINE, CATHETERIZED  Final   Special Requests NONE  Final   Culture NO GROWTH Performed at [facility]   Final   Report Status 01/06/2016 FINAL  Final  MRSA PCR Screening     Status: None   Collection Time: 01/05/16  8:57 AM  Result Value Ref Range Status   MRSA by PCR NEGATIVE NEGATIVE Final    Comment:        The GeneXpert MRSA Assay (FDA approved for NASAL specimens only), is one component of a comprehensive MRSA colonization surveillance program. It is not intended to diagnose MRSA infection nor to guide or monitor treatment for MRSA infections.   Culture, blood (routine x 2) Call MD if unable to obtain prior to antibiotics being given     Status: None   Collection Time: 01/05/16  9:54 AM  Result Value Ref Range Status   Specimen Description BLOOD RIGHT ANTECUBITAL  Final   Special Requests BOTTLES DRAWN AEROBIC AND ANAEROBIC 10 cc each  Final   Culture NO GROWTH 5 DAYS  Final   Report Status 01/10/2016 FINAL  Final  Culture, blood (routine x 2) Call MD if unable to obtain prior to antibiotics being given     Status: None   Collection Time: 01/05/16 10:03 AM  Result Value Ref Range Status   Specimen Description BLOOD BLOOD LEFT HAND  Final   Special Requests BOTTLES DRAWN AEROBIC AND ANAEROBIC 6 cc each  Final   Culture NO GROWTH 5 DAYS  Final   Report Status 01/10/2016 FINAL  Final  Culture, sputum-assessment      Status: None   Collection Time: 01/05/16  3:00 PM  Result Value Ref Range Status   Specimen Description EXPECTORATED SPUTUM  Final   Special Requests NONE  Final   Sputum evaluation   Final    THIS SPECIMEN IS ACCEPTABLE. RESPIRATORY CULTURE REPORT TO FOLLOW. Performed at [facility]    Report Status 01/05/2016 FINAL  Final  Culture, respiratory (NON-Expectorated)     Status: None   Collection Time: 01/05/16  3:00 PM  Result Value Ref Range Status   Specimen Description EXPECTORATED SPUTUM  Final   Special Requests NONE  Final   Gram Stain   Final    ABUNDANT WBC PRESENT, PREDOMINANTLY PMN ABUNDANT GRAM POSITIVE COCCI IN PAIRS IN CLUSTERS MODERATE GRAM NEGATIVE RODS FEW GRAM NEGATIVE COCCOBACILLI FEW GRAM POSITIVE RODS FEW GRAM VARIABLE ROD FEW SQUAMOUS EPITHELIAL CELLS PRESENT FEW GRAM NEGATIVE DIPLOCOCCI    Culture   Final    Consistent with normal respiratory flora. Performed at [facility]    Report Status 01/08/2016 FINAL  Final   Studies/Results: No results found.  Medications:  Prior to Admission:  Prescriptions Prior to Admission  Medication Sig Dispense Refill Last Dose  . ALPRAZolam (XANAX) 0.5 MG tablet  Take 0.5 mg by mouth 3 (three) times daily as needed for anxiety.   01/04/2016 at Unknown time  . dexlansoprazole (DEXILANT) 60 MG capsule Take 60 mg by mouth daily.     01/04/2016 at Unknown time  . diltiazem (CARDIZEM CD) 360 MG 24 hr capsule Take 1 capsule (360 mg total) by mouth daily. 30 capsule 0 01/04/2016 at Unknown time  . ELIQUIS 5 MG TABS tablet TAKE 1 TABLET BY MOUTH TWICE DAILY 60 tablet 5 01/04/2016 at 1700  . Fluticasone Furoate-Vilanterol (BREO ELLIPTA) 100-25 MCG/INH AEPB Inhale 1 puff into the lungs daily as needed (wheezing/shortness of breath).    unknown  . furosemide (LASIX) 40 MG tablet Take 1 tablet (40 mg total) by mouth daily. 30 tablet 1 01/04/2016 at Unknown time  . glipiZIDE (GLUCOTROL) 10 MG tablet Take 1 tablet (10 mg  total) by mouth 2 (two) times daily before a meal. Do not take glipizide if blood sugar falls below 125.   01/04/2016 at Unknown time  . HYDROcodone-acetaminophen (NORCO/VICODIN) 5-325 MG tablet Take 1 tablet by mouth 2 (two) times daily as needed for moderate pain.   unknown  . insulin glargine (LANTUS) 100 UNIT/ML injection Inject 0.15 mLs (15 Units total) into the skin 2 (two) times daily. 10 mL 11 01/04/2016 at Unknown time  . Insulin Syringes, Disposable, U-100 1 ML MISC 15 Units by Does not apply route 2 (two) times daily. 200 each 6 01/04/2016 at Unknown time  . levalbuterol (XOPENEX) 0.63 MG/3ML nebulizer solution Take 3 mLs (0.63 mg total) by nebulization every 3 (three) hours as needed for wheezing or shortness of breath. 3 mL 12 01/04/2016 at Unknown time  . levothyroxine (SYNTHROID, LEVOTHROID) 88 MCG tablet Take 88 mcg by mouth daily before breakfast.   01/04/2016 at Unknown time  . meclizine (ANTIVERT) 25 MG tablet Take 12.5 mg by mouth daily as needed for dizziness.   unknown  . metoprolol tartrate (LOPRESSOR) 25 MG tablet Take 25 mg by mouth 2 (two) times daily.   01/04/2016 at 1700  . nitroGLYCERIN (NITROSTAT) 0.4 MG SL tablet Place 1 tablet (0.4 mg total) under the tongue every 5 (five) minutes x 3 doses as needed for chest pain. For severe chest pain 25 tablet 3 unknown  . OXYGEN Inhale 2-3 L into the lungs continuous.   01/04/2016 at Unknown time  . rosuvastatin (CRESTOR) 10 MG tablet Take 1 tablet (10 mg total) by mouth daily. (Patient taking differently: Take 10 mg by mouth at bedtime. ) 30 tablet 6 01/04/2016 at Unknown time  . Vitamin D, Ergocalciferol, (DRISDOL) 50000 units CAPS capsule Take 50,000 Units by mouth every 7 (seven) days. Takes on Mondays.   Past Week at Unknown time  . [DISCONTINUED] levofloxacin (LEVAQUIN) 500 MG tablet Take 1 tablet (500 mg total) by mouth daily. Antibiotic. Starting 12/12/15 take 1 tablet daily until completed. 4 tablet 0   . [DISCONTINUED] predniSONE  (DELTASONE) 10 MG tablet Starting 12/12/15, take 6 tablets daily for 1 day; then 5 tablets the next day for 1 day; then 4 tablets the next day for 1 day; then 3 tablets the next day for 1 day; then 2 tablets the next day for 1 day; then 1 tablet the next day for 1 day; then stop. 21 tablet 0    Scheduled: . apixaban  5 mg Oral BID  . arformoterol  15 mcg Nebulization BID  . budesonide (PULMICORT) nebulizer solution  0.5 mg Nebulization BID  . diltiazem  90  mg Oral Q6H  . docusate sodium  200 mg Oral BID  . doxycycline  100 mg Oral Q12H  . furosemide  40 mg Intravenous Daily  . guaiFENesin  1,200 mg Oral BID  . insulin aspart  0-20 Units Subcutaneous TID WC  . insulin aspart  0-5 Units Subcutaneous QHS  . insulin glargine  20 Units Subcutaneous BID  . ipratropium  0.5 mg Nebulization TID  . levalbuterol  0.63 mg Nebulization TID  . levothyroxine  88 mcg Oral QAC breakfast  . metoprolol tartrate  50 mg Oral BID  . pantoprazole  40 mg Oral Daily  . predniSONE  50 mg Oral Q breakfast  . rosuvastatin  10 mg Oral QHS  . sodium chloride flush  3 mL Intravenous Q12H  . spironolactone  12.5 mg Oral Daily   Continuous:  ZJQ:BHALPF chloride, ALPRAZolam, alum & mag hydroxide-simeth, HYDROcodone-acetaminophen, levalbuterol, meclizine, sodium chloride flush  Assesment: She was admitted with COPD with acute exacerbation, atrial fibrillation with RVR and healthcare associated pneumonia. She's had acute on chronic hypoxic respiratory failure but as mentioned has refused to have arterial blood gas drawn. It's not clear that she's going to qualify for home BiPAP.  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  She is generally slowly improving. She had more trouble with atrial fib with RVR last night. 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 Blood gas if she'll allow it. Continue other medications. She has refused consideration of placement for rehabilitation.    LOS: 9 days   Jeananne Bedwell L 01/14/2016, 8:07 AM

## 2016-01-14 NOTE — Progress Notes (Signed)
PROGRESS NOTE    [name] [name]  ZOX:[id number] DOB: 01/16/1941 DOA: 01/05/2016 PCP: Ignatius Speckinghruv B Vyas, MD    Brief Narrative:   This is 75 year old female with a history of atrial fibrillation, COPD and chronic respiratory failure on home oxygen who presents to the hospital with 2 weeks of worsening shortness of breath. Chest x-ray indicated pneumonia and she was also found to have COPD exacerbation as well as rapid atrial fibrillation. She was placed on BiPAP and admitted to the stepdown unit for further treatment. Pulmonology has been consulted and assisting with management and cardiology managing A. fib with RVR   Assessment & Plan:   1. Acute on chronic respiratory failure with hypoxia. Likely related to HCAP and COPD exacerbation along with possible undiagnosed sleep apnea. She is chronically on 2-3L at home.  Pulmonology on Board and managing. Much improved on IV steroids, antibiotics, is requiring nighttime BiPAP since admission, discussed with pulmonary, also discussed with case management.   Per pulmonary as we are having problems with her ranging nighttime BiPAP due to lack of seizure study and insurance likely refusing BiPAP without it, we will check a baseline ABG on 01/14/2016, do not use BiPAP at night of 01/14/2016 and repeat ABG in the morning of 01/15/2016. We will do continuous pulse ox tonight with telemetry to monitor closely while she is off of BiPAP and oxygen.    2. HCAP - antibiotics tapered to doxycycline continue for 1 more day. Infection clinically resolved.  3. COPD exacerbation. Much improved, we'll continue doxycycline for 1 more day, switched to oral steroids.  4. Hypokalemia - replaced  5. A. fib with RVR. CHADSVASc 6. She is anticoagulated with Eliquis.  Currently on cardizem 90 mg po q 6hr & metoprolol. Cardiology managing.   6. Hypertension. Blood pressures are currently stable. Continue medications as in #5.  7. Acute on chronic systolic CHF EF 65%. Stop  all IV fluids, IV Lasix and monitor.   8. Uncontrolled diabetes. On Lantus and resistant sliding-scale. Blood sugars have since improved.  Lab Results  Component Value Date   HGBA1C 8.9 (H) 12/09/2015   CBG (last 3)   Recent Labs  01/14/16 0046 01/14/16 0423 01/14/16 0808  GLUCAP 247* 182* 124*     I was called by patient's granddaughter at 10:50 AM on 01/13/2016. She informed me that she is in the medical field and she will want me to order a stat chest x-ray and ABG to evaluate her grandmothers condition. I informed her that her grandmother was seen this morning and she was feeling much better in fact wanted to go home. She has already been titrated down on oxygen, I just confirmed with the nurse patient is absolutely comfortable. I had already discussed the plan with pulmonary physician Dr. Juanetta GoslingHawkins this morning. I informed the patient's granddaughter that clinically these tests are not warranted and will not be ordered. She was extremely disrespectful and condescending.   DVT prophylaxis: eliquis Code Status: Full Family Communication: previous MD discussed with grand daughter at the bedside Disposition Plan: discharge home in am if tolerates being off BiPaP, if not arrange for home BiPaP   Consultants:   Pulmonology   Cardiology  Procedures:   TTE - Left ventricle: The cavity size was normal. Systolic function was normal. The estimated ejection fraction was in the range of 60% to 65%. Wall motion was normal; there were no regional wall motion abnormalities. The study is not technically sufficient to allow evaluation of LV diastolic  function. - Mitral valve: There was mild regurgitation. - Left atrium: The atrium was mildly dilated. - Right atrium: The atrium was mildly dilated. - Atrial septum: No defect or patent foramen ovale was identified. - Pulmonary arteries: PA peak pressure: 69 mm Hg (S).  Anti-infectives    Start     Dose/Rate Route Frequency Ordered Stop    01/09/16 1045  doxycycline (VIBRA-TABS) tablet 100 mg     100 mg Oral Every 12 hours 01/09/16 1032     01/05/16 1800  ceFEPIme (MAXIPIME) 1 g in dextrose 5 % 50 mL IVPB  Status:  Discontinued     1 g 100 mL/hr over 30 Minutes Intravenous Every 12 hours 01/05/16 0909 01/09/16 1032   01/05/16 1800  vancomycin (VANCOCIN) IVPB 750 mg/150 ml premix  Status:  Discontinued     750 mg 150 mL/hr over 60 Minutes Intravenous Every 12 hours 01/05/16 0947 01/09/16 1032   01/05/16 0545  ceFEPIme (MAXIPIME) 2 g in dextrose 5 % 50 mL IVPB     2 g 100 mL/hr over 30 Minutes Intravenous  Once 01/05/16 0539 01/05/16 0716   01/05/16 0545  vancomycin (VANCOCIN) IVPB 1000 mg/200 mL premix     1,000 mg 200 mL/hr over 60 Minutes Intravenous  Once 01/05/16 0539 01/05/16 0816      Subjective:  In bed, denies any headache, no fever or chills, denies any chest pain, shortness of breath has considerably improved. No abdominal pain or weakness.  Objective: Vitals:   01/14/16 0535 01/14/16 0648 01/14/16 0902 01/14/16 0913  BP: (!) 153/91     Pulse: 75     Resp: (!) 21     Temp: 98.3 F (36.8 C)     TempSrc: Oral     SpO2: 98% 96% 90% 93%  Weight:      Height:        Intake/Output Summary (Last 24 hours) at 01/14/16 0940 Last data filed at 01/14/16 0842  Gross per 24 hour  Intake              960 ml  Output             2000 ml  Net            -1040 ml   Filed Weights   01/12/16 0500 01/13/16 0500 01/13/16 1914  Weight: 70.9 kg (156 lb 4.9 oz) 72.5 kg (159 lb 13.3 oz) 71.2 kg (156 lb 15.5 oz)    Examination:  General exam: Appears calm and comfortable, in nad. Respiratory system: no wheezes. Poor inspiratory effort. Equal chest rise, prolonged expiratory phase, few rales Cardiovascular system: S1 & S2 heard, irregular. No JVD, murmurs, rubs, gallops or clicks. No pedal edema. Gastrointestinal system: Abdomen is nondistended, soft and nontender. No organomegaly or masses felt. Normal bowel sounds  heard. Central nervous system: Alert and oriented. No focal neurological deficits. Extremities: Symmetric 5 x 5 power. Skin: No rashes, lesions or ulcers Psychiatry: Judgement and insight appear normal. Mood & affect appropriate.   Data Reviewed: I have personally reviewed following labs and imaging studies  CBC:  Recent Labs Lab 01/09/16 0502 01/12/16 0547  WBC 9.1 9.8  HGB 12.5 11.3*  HCT 41.9 39.0  MCV 99.1 101.6*  PLT 208 150   Basic Metabolic Panel:  Recent Labs Lab 01/08/16 1346 01/09/16 2108 01/10/16 0912 01/12/16 0547 01/12/16 1411 01/12/16 2341  NA 135  --  133* 140 136 138  K 3.0*  --  3.2* 3.0* 2.5*  4.2  CL 88*  --  82* 80* 76* 83*  CO2 37*  --  44* >50* >50* 48*  GLUCOSE 425* 392* 326* 176* 465* 248*  BUN 31*  --  34* 42* 45* 43*  CREATININE 1.01*  --  1.09* 0.97 1.09* 0.99  CALCIUM 8.6*  --  8.7* 8.9 8.6* 9.0  MG  --   --   --  1.9  --   --    GFR: Estimated Creatinine Clearance: 46 mL/min (by C-G formula based on SCr of 0.99 mg/dL). Liver Function Tests: No results for input(s): AST, ALT, ALKPHOS, BILITOT, PROT, ALBUMIN in the last 168 hours. No results for input(s): LIPASE, AMYLASE in the last 168 hours. No results for input(s): AMMONIA in the last 168 hours. Coagulation Profile: No results for input(s): INR, PROTIME in the last 168 hours. Cardiac Enzymes: No results for input(s): CKTOTAL, CKMB, CKMBINDEX, TROPONINI in the last 168 hours. BNP (last 3 results) No results for input(s): PROBNP in the last 8760 hours. HbA1C: No results for input(s): HGBA1C in the last 72 hours. CBG:  Recent Labs Lab 01/13/16 1609 01/13/16 2050 01/14/16 0046 01/14/16 0423 01/14/16 0808  GLUCAP 283* 272* 247* 182* 124*   Lipid Profile: No results for input(s): CHOL, HDL, LDLCALC, TRIG, CHOLHDL, LDLDIRECT in the last 72 hours. Thyroid Function Tests: No results for input(s): TSH, T4TOTAL, FREET4, T3FREE, THYROIDAB in the last 72 hours. Anemia Panel: No  results for input(s): VITAMINB12, FOLATE, FERRITIN, TIBC, IRON, RETICCTPCT in the last 72 hours. Sepsis Labs: No results for input(s): PROCALCITON, LATICACIDVEN in the last 168 hours.  Recent Results (from the past 240 hour(s))  Culture, blood (Routine X 2) w Reflex to ID Panel     Status: None   Collection Time: 01/05/16  5:45 AM  Result Value Ref Range Status   Specimen Description BLOOD LEFT ANTECUBITAL  Final   Special Requests BOTTLES DRAWN AEROBIC AND ANAEROBIC 6CC EACH  Final   Culture NO GROWTH 5 DAYS  Final   Report Status 01/10/2016 FINAL  Final  Culture, blood (Routine X 2) w Reflex to ID Panel     Status: None   Collection Time: 01/05/16  5:51 AM  Result Value Ref Range Status   Specimen Description   Final    BLOOD BLOOD RIGHT HAND AEROBIC RH BLOOD LEFT HAND ANAEROBIC LH   Special Requests BOTTLES DRAWN AEROBIC AND ANAEROBIC 6CC EACH  Final   Culture NO GROWTH 5 DAYS  Final   Report Status 01/10/2016 FINAL  Final  Urine culture     Status: None   Collection Time: 01/05/16  6:11 AM  Result Value Ref Range Status   Specimen Description URINE, CATHETERIZED  Final   Special Requests NONE  Final   Culture NO GROWTH Performed at [facility]   Final   Report Status 01/06/2016 FINAL  Final  MRSA PCR Screening     Status: None   Collection Time: 01/05/16  8:57 AM  Result Value Ref Range Status   MRSA by PCR NEGATIVE NEGATIVE Final    Comment:        The GeneXpert MRSA Assay (FDA approved for NASAL specimens only), is one component of a comprehensive MRSA colonization surveillance program. It is not intended to diagnose MRSA infection nor to guide or monitor treatment for MRSA infections.   Culture, blood (routine x 2) Call MD if unable to obtain prior to antibiotics being given     Status: None  Collection Time: 01/05/16  9:54 AM  Result Value Ref Range Status   Specimen Description BLOOD RIGHT ANTECUBITAL  Final   Special Requests BOTTLES DRAWN AEROBIC  AND ANAEROBIC 10 cc each  Final   Culture NO GROWTH 5 DAYS  Final   Report Status 01/10/2016 FINAL  Final  Culture, blood (routine x 2) Call MD if unable to obtain prior to antibiotics being given     Status: None   Collection Time: 01/05/16 10:03 AM  Result Value Ref Range Status   Specimen Description BLOOD BLOOD LEFT HAND  Final   Special Requests BOTTLES DRAWN AEROBIC AND ANAEROBIC 6 cc each  Final   Culture NO GROWTH 5 DAYS  Final   Report Status 01/10/2016 FINAL  Final  Culture, sputum-assessment     Status: None   Collection Time: 01/05/16  3:00 PM  Result Value Ref Range Status   Specimen Description EXPECTORATED SPUTUM  Final   Special Requests NONE  Final   Sputum evaluation   Final    THIS SPECIMEN IS ACCEPTABLE. RESPIRATORY CULTURE REPORT TO FOLLOW. Performed at [facility]    Report Status 01/05/2016 FINAL  Final  Culture, respiratory (NON-Expectorated)     Status: None   Collection Time: 01/05/16  3:00 PM  Result Value Ref Range Status   Specimen Description EXPECTORATED SPUTUM  Final   Special Requests NONE  Final   Gram Stain   Final    ABUNDANT WBC PRESENT, PREDOMINANTLY PMN ABUNDANT GRAM POSITIVE COCCI IN PAIRS IN CLUSTERS MODERATE GRAM NEGATIVE RODS FEW GRAM NEGATIVE COCCOBACILLI FEW GRAM POSITIVE RODS FEW GRAM VARIABLE ROD FEW SQUAMOUS EPITHELIAL CELLS PRESENT FEW GRAM NEGATIVE DIPLOCOCCI    Culture   Final    Consistent with normal respiratory flora. [facility]    Report Status 01/08/2016 FINAL  Final     Radiology Studies: No results found.   Scheduled Meds: . apixaban  5 mg Oral BID  . arformoterol  15 mcg Nebulization BID  . budesonide (PULMICORT) nebulizer solution  0.5 mg Nebulization BID  . diltiazem  90 mg Oral Q6H  . docusate sodium  200 mg Oral BID  . doxycycline  100 mg Oral Q12H  . furosemide  40 mg Intravenous BID  . guaiFENesin  1,200 mg Oral BID  . insulin aspart  0-20 Units Subcutaneous TID WC  .  insulin aspart  0-5 Units Subcutaneous QHS  . insulin glargine  20 Units Subcutaneous BID  . ipratropium  0.5 mg Nebulization TID  . levalbuterol  0.63 mg Nebulization TID  . levothyroxine  88 mcg Oral QAC breakfast  . metoprolol tartrate  50 mg Oral BID  . pantoprazole  40 mg Oral Daily  . predniSONE  50 mg Oral Q breakfast  . rosuvastatin  10 mg Oral QHS  . spironolactone  12.5 mg Oral Daily   Continuous Infusions:   LOS: 9 days   Time spent: > 35 mins  Signature  Susa RaringSINGH,Teriann Livingood K M.D on 01/14/2016 at 9:40 AM  Between 7am to 7pm - Pager - [phone], After 7pm go to [email or web address] - password [email or web address]  [facility]  - Office  [phone]

## 2016-01-14 NOTE — Progress Notes (Signed)
Inpatient Diabetes Program Recommendations  AACE/ADA: New Consensus Statement on Inpatient Glycemic Control (2015)  Target Ranges:  Prepandial:   less than 140 mg/dL      Peak postprandial:   less than 180 mg/dL (1-2 hours)      Critically ill patients:  140 - 180 mg/dL   Results for [name] [name], [name] [name] (MRN [id number]) as of 01/14/2016 09:38  Ref. Range 01/13/2016 07:50 01/13/2016 11:25 01/13/2016 16:09 01/13/2016 20:50 01/14/2016 00:46 01/14/2016 04:23 01/14/2016 08:08  Glucose-Capillary Latest Ref Range: 65 - 99 mg/dL 409124 (H)  Novolog 3 units  Lantus 20 units 260 (H)  Novolog 11 units 283 (H)  Novolog 11 units 272 (H)  Novolog 3 units  Lantus 20 units 247 (H) 182 (H) 124 (H)  Novolog 3 units  Lantus 20 units   Review of Glycemic Control  Diabetes history:DM2 Outpatient Diabetes medications: Glipizide 10 mg BID, Lantus 15 units BID Current orders for Inpatient glycemic control: Lantus 20 units BID, Novlog 0-20 units TID with meals, Novolog 0-5 units QHS  Inpatient Diabetes Program Recommendations: Insulin - Meal Coverage:Glucose ranged from 124-283 mg/dl on 81/19/1411/15/17 and post prandial glucose is consistently elevated due to steroids. Patient received a total of Novolog 28 units for correction on 01/13/16.  If steroids are continued, please consider ordering Novolog 6 units TID with meals for meal coverage if patient eats at least 50% of meals.  Thanks, Orlando PennerMarie Kadar Chance, RN, MSN, CDE Diabetes Coordinator Inpatient Diabetes Program [phone] (Team Pager from 8am to 5pm)

## 2016-01-15 ENCOUNTER — Encounter (INDEPENDENT_AMBULATORY_CARE_PROVIDER_SITE_OTHER): Payer: Self-pay | Admitting: Internal Medicine

## 2016-01-15 LAB — GLUCOSE, CAPILLARY
GLUCOSE-CAPILLARY: 176 mg/dL — AB (ref 65–99)
Glucose-Capillary: 326 mg/dL — ABNORMAL HIGH (ref 65–99)
Glucose-Capillary: 108 mg/dL — ABNORMAL HIGH (ref 65–99)
Glucose-Capillary: 270 mg/dL — ABNORMAL HIGH (ref 65–99)

## 2016-01-15 LAB — BLOOD GAS, ARTERIAL
Acid-Base Excess: 26 mmol/L — ABNORMAL HIGH (ref 0.0–2.0)
Bicarbonate: 48.9 mmol/L — ABNORMAL HIGH (ref 20.0–28.0)
Drawn by: 221791
O2 CONTENT: 6 L/min
O2 SAT: 96.4 %
PCO2 ART: 76.4 mmHg — AB (ref 32.0–48.0)
PO2 ART: 86.3 mmHg (ref 83.0–108.0)
Patient temperature: 37
pH, Arterial: 7.451 — ABNORMAL HIGH (ref 7.350–7.450)

## 2016-01-15 MED ORDER — PREDNISONE 5 MG PO TABS: ORAL_TABLET | ORAL | 0 refills | Status: DC

## 2016-01-15 MED ORDER — SPIRONOLACTONE 25 MG PO TABS: 12.5000 mg | ORAL_TABLET | Freq: Every day | ORAL | 0 refills | Status: AC

## 2016-01-15 NOTE — Progress Notes (Signed)
Subjective: Marland Kitchen She is still coughing. Still complains of shortness of breath. she is refusing BiPAP now.  Objective: Vital signs in last 24 hours: Temp:  [97.6 F (36.4 C)-99.1 F (37.3 C)] 98.2 F (36.8 C) (11/17 0409) Pulse Rate:  [40-152] 101 (11/17 0409) Resp:  [15-16] 15 (11/17 0409) BP: (108-133)/(61-82) 108/61 (11/17 0409) SpO2:  [9 %-100 %] 97 % (11/17 0816) Weight change:  Last BM Date: 01/14/16  Intake/Output from previous day: 11/16 0701 - 11/17 0700 In: 240 [P.O.:240] Out: 2850 [Urine:2850]  PHYSICAL EXAM General appearance: alert and mild distress Resp: rhonchi bilaterally Cardio: regular rate and rhythm, S1, S2 normal, no murmur, click, rub or gallop GI: soft, non-tender; bowel sounds normal; no masses,  no organomegaly Extremities: extremities normal, atraumatic, no cyanosis or edema Skin warm and dry. She is on nasal oxygen.  Lab Results:  Results for orders placed or performed during the hospital encounter of 01/05/16 (from the past 48 hour(s))  Glucose, capillary     Status: Abnormal   Collection Time: 01/13/16 11:25 AM  Result Value Ref Range   Glucose-Capillary 260 (H) 65 - 99 mg/dL  Glucose, capillary     Status: Abnormal   Collection Time: 01/13/16  4:09 PM  Result Value Ref Range   Glucose-Capillary 283 (H) 65 - 99 mg/dL   Comment 1 Notify RN    Comment 2 Document in Chart   Glucose, capillary     Status: Abnormal   Collection Time: 01/13/16  8:50 PM  Result Value Ref Range   Glucose-Capillary 272 (H) 65 - 99 mg/dL  Glucose, capillary     Status: Abnormal   Collection Time: 01/14/16 12:46 AM  Result Value Ref Range   Glucose-Capillary 247 (H) 65 - 99 mg/dL   Comment 1 Notify RN    Comment 2 Document in Chart   Glucose, capillary     Status: Abnormal   Collection Time: 01/14/16  4:23 AM  Result Value Ref Range   Glucose-Capillary 182 (H) 65 - 99 mg/dL   Comment 1 Notify RN    Comment 2 Document in Chart   Glucose, capillary     Status:  Abnormal   Collection Time: 01/14/16  8:08 AM  Result Value Ref Range   Glucose-Capillary 124 (H) 65 - 99 mg/dL   Comment 1 Notify RN    Comment 2 Document in Chart   Blood gas, arterial     Status: Abnormal   Collection Time: 01/14/16  8:15 AM  Result Value Ref Range   O2 Content 2.0 L/min   Delivery systems NASAL CANNULA    pH, Arterial 7.52 (H) 7.350 - 7.450   pCO2 arterial 61.1 (H) 32.0 - 48.0 mmHg   pO2, Arterial 62.0 (L) 83.0 - 108.0 mmHg   Bicarbonate 47.8 (H) 20.0 - 28.0 mmol/L   Acid-Base Excess 24.6 (H) 0.0 - 2.0 mmol/L   O2 Saturation 92.2 %   Patient temperature 37.0    Collection site LEFT RADIAL    Drawn by [id number]    Sample type ARTERIAL DRAW    Allens test (pass/fail) PASS PASS  Glucose, capillary     Status: Abnormal   Collection Time: 01/14/16 11:27 AM  Result Value Ref Range   Glucose-Capillary 225 (H) 65 - 99 mg/dL  Glucose, capillary     Status: Abnormal   Collection Time: 01/14/16  6:27 PM  Result Value Ref Range   Glucose-Capillary 350 (H) 65 - 99 mg/dL   Comment 1 Notify RN  Comment 2 Document in Chart   Glucose, capillary     Status: Abnormal   Collection Time: 01/14/16  8:08 PM  Result Value Ref Range   Glucose-Capillary 359 (H) 65 - 99 mg/dL   Comment 1 Notify RN    Comment 2 Document in Chart   Glucose, capillary     Status: Abnormal   Collection Time: 01/15/16  1:10 AM  Result Value Ref Range   Glucose-Capillary 326 (H) 65 - 99 mg/dL   Comment 1 Notify RN    Comment 2 Document in Chart   Glucose, capillary     Status: Abnormal   Collection Time: 01/15/16  4:07 AM  Result Value Ref Range   Glucose-Capillary 176 (H) 65 - 99 mg/dL   Comment 1 Notify RN    Comment 2 Document in Chart   Blood gas, arterial     Status: Abnormal   Collection Time: 01/15/16  7:47 AM  Result Value Ref Range   O2 Content 6.0 L/min   Delivery systems NASAL CANNULA    pH, Arterial 7.451 (H) 7.350 - 7.450   pCO2 arterial 76.4 (HH) 32.0 - 48.0 mmHg    Comment:  RBV C.MCGIBBONY,RN AT 0757 BY V.LAWSON,RRT ON 7 RBV C.MCGIBBONY,RN AT 0757 BY V.LAWSON,RRT ON 01/15/16    pO2, Arterial 86.3 83.0 - 108.0 mmHg   Bicarbonate 48.9 (H) 20.0 - 28.0 mmol/L   Acid-Base Excess 26.0 (H) 0.0 - 2.0 mmol/L   O2 Saturation 96.4 %   Patient temperature 37.0    Collection site LEFT RADIAL    Drawn by [phone]    Sample type ARTERIAL    Allens test (pass/fail) PASS PASS  Glucose, capillary     Status: Abnormal   Collection Time: 01/15/16  8:10 AM  Result Value Ref Range   Glucose-Capillary 108 (H) 65 - 99 mg/dL    ABGS  Recent Labs  29/52/8411/17/17 0747  PHART 7.451*  PO2ART 86.3  HCO3 48.9*   CULTURES Recent Results (from the past 240 hour(s))  MRSA PCR Screening     Status: None   Collection Time: 01/05/16  8:57 AM  Result Value Ref Range Status   MRSA by PCR NEGATIVE NEGATIVE Final    Comment:        The GeneXpert MRSA Assay (FDA approved for NASAL specimens only), is one component of a comprehensive MRSA colonization surveillance program. It is not intended to diagnose MRSA infection nor to guide or monitor treatment for MRSA infections.   Culture, blood (routine x 2) Call MD if unable to obtain prior to antibiotics being given     Status: None   Collection Time: 01/05/16  9:54 AM  Result Value Ref Range Status   Specimen Description BLOOD RIGHT ANTECUBITAL  Final   Special Requests BOTTLES DRAWN AEROBIC AND ANAEROBIC 10 cc each  Final   Culture NO GROWTH 5 DAYS  Final   Report Status 01/10/2016 FINAL  Final  Culture, blood (routine x 2) Call MD if unable to obtain prior to antibiotics being given     Status: None   Collection Time: 01/05/16 10:03 AM  Result Value Ref Range Status   Specimen Description BLOOD BLOOD LEFT HAND  Final   Special Requests BOTTLES DRAWN AEROBIC AND ANAEROBIC 6 cc each  Final   Culture NO GROWTH 5 DAYS  Final   Report Status 01/10/2016 FINAL  Final  Culture, sputum-assessment     Status: None   Collection Time:  01/05/16  3:00 PM  Result Value Ref Range Status   Specimen Description EXPECTORATED SPUTUM  Final   Special Requests NONE  Final   Sputum evaluation   Final    THIS SPECIMEN IS ACCEPTABLE. RESPIRATORY CULTURE REPORT TO FOLLOW. Performed at [facility]    Report Status 01/05/2016 FINAL  Final  Culture, respiratory (NON-Expectorated)     Status: None   Collection Time: 01/05/16  3:00 PM  Result Value Ref Range Status   Specimen Description EXPECTORATED SPUTUM  Final   Special Requests NONE  Final   Gram Stain   Final    ABUNDANT WBC PRESENT, PREDOMINANTLY PMN ABUNDANT GRAM POSITIVE COCCI IN PAIRS IN CLUSTERS MODERATE GRAM NEGATIVE RODS FEW GRAM NEGATIVE COCCOBACILLI FEW GRAM POSITIVE RODS FEW GRAM VARIABLE ROD FEW SQUAMOUS EPITHELIAL CELLS PRESENT FEW GRAM NEGATIVE DIPLOCOCCI    Culture   Final    Consistent with normal respiratory flora. Performed at [facility]    Report Status 01/08/2016 FINAL  Final   Studies/Results: No results found.  Medications:  Prior to Admission:  Prescriptions Prior to Admission  Medication Sig Dispense Refill Last Dose  . ALPRAZolam (XANAX) 0.5 MG tablet Take 0.5 mg by mouth 3 (three) times daily as needed for anxiety.   01/04/2016 at Unknown time  . dexlansoprazole (DEXILANT) 60 MG capsule Take 60 mg by mouth daily.     01/04/2016 at Unknown time  . diltiazem (CARDIZEM CD) 360 MG 24 hr capsule Take 1 capsule (360 mg total) by mouth daily. 30 capsule 0 01/04/2016 at Unknown time  . ELIQUIS 5 MG TABS tablet TAKE 1 TABLET BY MOUTH TWICE DAILY 60 tablet 5 01/04/2016 at 1700  . Fluticasone Furoate-Vilanterol (BREO ELLIPTA) 100-25 MCG/INH AEPB Inhale 1 puff into the lungs daily as needed (wheezing/shortness of breath).    unknown  . furosemide (LASIX) 40 MG tablet Take 1 tablet (40 mg total) by mouth daily. 30 tablet 1 01/04/2016 at Unknown time  . glipiZIDE (GLUCOTROL) 10 MG tablet Take 1 tablet (10 mg total) by mouth 2 (two) times daily  before a meal. Do not take glipizide if blood sugar falls below 125.   01/04/2016 at Unknown time  . HYDROcodone-acetaminophen (NORCO/VICODIN) 5-325 MG tablet Take 1 tablet by mouth 2 (two) times daily as needed for moderate pain.   unknown  . insulin glargine (LANTUS) 100 UNIT/ML injection Inject 0.15 mLs (15 Units total) into the skin 2 (two) times daily. 10 mL 11 01/04/2016 at Unknown time  . Insulin Syringes, Disposable, U-100 1 ML MISC 15 Units by Does not apply route 2 (two) times daily. 200 each 6 01/04/2016 at Unknown time  . levalbuterol (XOPENEX) 0.63 MG/3ML nebulizer solution Take 3 mLs (0.63 mg total) by nebulization every 3 (three) hours as needed for wheezing or shortness of breath. 3 mL 12 01/04/2016 at Unknown time  . levothyroxine (SYNTHROID, LEVOTHROID) 88 MCG tablet Take 88 mcg by mouth daily before breakfast.   01/04/2016 at Unknown time  . meclizine (ANTIVERT) 25 MG tablet Take 12.5 mg by mouth daily as needed for dizziness.   unknown  . metoprolol tartrate (LOPRESSOR) 25 MG tablet Take 25 mg by mouth 2 (two) times daily.   01/04/2016 at 1700  . nitroGLYCERIN (NITROSTAT) 0.4 MG SL tablet Place 1 tablet (0.4 mg total) under the tongue every 5 (five) minutes x 3 doses as needed for chest pain. For severe chest pain 25 tablet 3 unknown  . OXYGEN Inhale 2-3 L into the lungs continuous.  01/04/2016 at Unknown time  . rosuvastatin (CRESTOR) 10 MG tablet Take 1 tablet (10 mg total) by mouth daily. (Patient taking differently: Take 10 mg by mouth at bedtime. ) 30 tablet 6 01/04/2016 at Unknown time  . Vitamin D, Ergocalciferol, (DRISDOL) 50000 units CAPS capsule Take 50,000 Units by mouth every 7 (seven) days. Takes on Mondays.   Past Week at Unknown time  . [DISCONTINUED] levofloxacin (LEVAQUIN) 500 MG tablet Take 1 tablet (500 mg total) by mouth daily. Antibiotic. Starting 12/12/15 take 1 tablet daily until completed. 4 tablet 0   . [DISCONTINUED] predniSONE (DELTASONE) 10 MG tablet Starting  12/12/15, take 6 tablets daily for 1 day; then 5 tablets the next day for 1 day; then 4 tablets the next day for 1 day; then 3 tablets the next day for 1 day; then 2 tablets the next day for 1 day; then 1 tablet the next day for 1 day; then stop. 21 tablet 0    Scheduled: . apixaban  5 mg Oral BID  . arformoterol  15 mcg Nebulization BID  . budesonide (PULMICORT) nebulizer solution  0.5 mg Nebulization BID  . diltiazem  90 mg Oral Q6H  . docusate sodium  200 mg Oral BID  . doxycycline  100 mg Oral Q12H  . furosemide  40 mg Intravenous BID  . guaiFENesin  1,200 mg Oral BID  . insulin aspart  0-20 Units Subcutaneous TID WC  . insulin aspart  0-5 Units Subcutaneous QHS  . insulin glargine  20 Units Subcutaneous BID  . ipratropium  0.5 mg Nebulization TID  . levalbuterol  0.63 mg Nebulization TID  . levothyroxine  88 mcg Oral QAC breakfast  . metoprolol tartrate  50 mg Oral BID  . pantoprazole  40 mg Oral Daily  . predniSONE  50 mg Oral Q breakfast  . rosuvastatin  10 mg Oral QHS  . spironolactone  12.5 mg Oral Daily   Continuous:  WGN:[id number]:ALPRAZolam, alum & mag hydroxide-simeth, HYDROcodone-acetaminophen, levalbuterol, meclizine  Assesment: She was admitted with COPD with acute exacerbation. She has healthcare associated pneumonia. Active Problems:   Essential hypertension, benign   COPD with acute exacerbation (HCC)   Atrial fibrillation with RVR (HCC)   Chronic anticoagulation-CHADs VASc=6   Acute on chronic respiratory failure with hypoxia (HCC)   Uncontrolled type 2 diabetes mellitus with complication (HCC)   HCAP (healthcare-associated pneumonia)   Immunoglobulin G deficiency (HCC)    Plan: Continue current treatments.    LOS: 10 days   Paarth Cropper L 01/15/2016, 8:41 AM

## 2016-01-15 NOTE — Progress Notes (Signed)
[name] [name], RT called to report abnormal blood gas results. MD notified.

## 2016-01-15 NOTE — Progress Notes (Signed)
I went to place patient on her BIPAP and explained the importance of continuing to wear it through the night. At that point patient was on  HFNC at 5lpm. She refused to wear the device at that time and she was aware of the effects of not wearing it. I checked her saturation on the cannula and it was 98% HR 97. Patient was montiored throughout the night and saturations have maintianed in the 90s.

## 2016-01-15 NOTE — Progress Notes (Signed)
1442 d/c instructions, paperwork and hard Rx given to patient and patient's daughter. IV catheter removed from patient's RIGHT arm, intact with no s/s of infection noted. Telemetry box & wiring removed from patient and central telemetry called and made aware. Patient's daughter arrived with patient's home O2 tank and will be transported home by her daughter.

## 2016-01-15 NOTE — Care Management Important Message (Signed)
Important Message  Patient Details  Name: [name] [name] MRN: [id number] Date of Birth: 07/20/1940   Medicare Important Message Given:  Yes    Malvika Tung, Chrystine OilerSharley Diane, RN 01/15/2016, 12:41 PM

## 2016-01-15 NOTE — Progress Notes (Signed)
Late Entry- At approximately [id number] RN notified of patient's ABG results- elevated CO2 level of 76.4 on 6L HFNC. Per Dr.Singh's order (regarding the results ) the patients oxygen was to be decreased to  3L. RT administered Brovana with air. At 0825 patients SpO2 on 3L HFNC was 90-91%.

## 2016-01-15 NOTE — Discharge Summary (Addendum)
[name] [name] UEA:[id number] DOB: 09-19-1940 DOA: 01/05/2016  PCP: Ignatius Specking, MD  Admit date: 01/05/2016  Discharge date: 01/15/2016  Admitted From: Home   Disposition:  Home   Recommendations for Outpatient Follow-up:   Follow up with PCP in 1-2 weeks  PCP Please obtain BMP/CBC, 2 view CXR in 1week,  (see Discharge instructions)   PCP Please follow up on the following pending results: Follow CBC, BMP, CXR in 3-4 days   Home Health: PT, RN, RT  Equipment/Devices: NIV QHS, start 12/6 and adjust as needed Consultations: Pulm, Cards Discharge Condition: Stable, her chances of readmission are high due to advanced baseline COPD and noncompliance with prescribed therapies such as BiPAP in the hospital.  CODE STATUS: Full  Diet Recommendation: Heart Healthy- Low Carb, 1.5lit/day fluid restriction daily   Chief Complaint  Patient presents with  . Shortness of Breath     Brief history of present illness from the day of admission and additional interim summary    This is 75 year old female with a history of atrial fibrillation, COPD and chronic respiratory failure on home oxygen who presents to the hospital with 2 weeks of worsening shortness of breath. Chest x-ray indicated pneumonia and she was also found to have COPD exacerbation as well as rapid atrial fibrillation. She was placed on BiPAP and admitted to the stepdown unit for further treatment. Pulmonology has been consulted and assisting with management and cardiology managing A. fib with RVR  Hospital issues addressed     1. Acute on chronic respiratory failure with hypoxia. Likely related to HCAP and COPD exacerbation along with possible undiagnosed sleep apnea. She is chronically on 2-3L at home and now down to 3 L nasal cannula per minute oxygen.    pulmonary followed the patient as well she is now much improved after IV steroids and antibiotics, she required nighttime BiPAP but for the last 2 days she has refused to same.  Discussed her case in detail with pulmonologist Dr. Juanetta Gosling today at the time of discharge at 8:50 AM after he had seen the patient, discussed patient's ABGs, by Dr. Juanetta Gosling would be appropriate to discharge the patient on her home oxygen and if possible nighttime NIV for CO2 retention, she will eventually require outpatient sleep study. She is currently compensated.  She will be placed on oral steroid taper, NIV will be ordered for COPD with Hypercapnic Resp failure acute on chronic, she will follow with PCP and Dr. Juanetta Gosling within a week. She is currently feeling better and eager to be discharged.    2. HCAP - she has finished her antibiotic treatment . Infection clinically resolved.  Request PCP to repeat 2 view chest x-ray in 3-4 days.  3. COPD exacerbation. Much improved, switched to oral steroids yesterday, continue taper upon discharge.  4. Hypokalemia - replaced stable.  5. A. fib with RVR. CHADSVASc6. She is anticoagulated with Eliquis which will be continued, going to be rate control, continue home dose Cardizem & metoprolol. Cardiology saw the patient here.  6. Hypertension. Blood pressures are currently stable. Continue medications as in #5.  7. Acute on chronic systolic CHF EF 65%. Resolved after IV Lasix, continue home dose Lasix upon discharge along with salt and fluid restriction and beta blocker.   8. DM2. Continue home regimen.  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)    Discharge instructions    Discharge Instructions    Discharge instructions    Complete by:  As directed    Follow with Primary MD Ignatius Speckinghruv B Vyas, MD in 2-3 days   Get CBC, CMP, 2 view Chest X ray checked  by Primary MD in 2-3 days ( we routinely change or add medications that can affect your baseline labs and fluid status, therefore we recommend that you get the mentioned basic workup next visit with your PCP, your PCP may decide not to get them or add new tests based on their clinical decision)   Activity: As tolerated with Full fall precautions use walker/cane & assistance as needed   Disposition Home     Diet:   Heart healthy low carbohydrate. Check your Weight same time everyday, if you gain over 2 pounds, or you develop in leg swelling, experience more shortness of breath or chest pain, call your Primary MD immediately. Follow Cardiac Low Salt Diet and 1.5 lit/day fluid restriction.  Accuchecks 4 times/day, Once in AM empty stomach and then before each meal. Log in all results and show them to your Prim.MD in 3 days. If any glucose reading is under 80 or above 300 call your Prim MD immidiately. Follow Low glucose instructions for glucose under 80 as instructed.  On your next visit with your primary care physician please Get Medicines reviewed and adjusted.   Please request your Prim.MD to go over all Hospital Tests and Procedure/Radiological results at the follow up, please get all Hospital records sent to your Prim MD by signing hospital release before you go home.   If you experience worsening of your admission symptoms, develop shortness of breath, life threatening emergency, suicidal or homicidal thoughts you must seek medical attention immediately by calling 911 or  calling your MD immediately  if symptoms less severe.  You Must read complete instructions/literature along with all the possible adverse reactions/side effects for all the Medicines you take and that have been prescribed to you. Take any new Medicines after you have completely understood and accpet all the possible adverse reactions/side effects.   Do not drive, operate heavy machinery, perform activities at heights, swimming or participation in water activities or provide baby sitting services if your were admitted for syncope or siezures until you have seen by Primary MD or a Neurologist and advised to do so again.  Do not drive when taking Pain medications.    Do not take more than prescribed Pain, Sleep and Anxiety Medications  Special Instructions: If you have smoked or chewed Tobacco  in the last 2 yrs please  stop smoking, stop any regular Alcohol  and or any Recreational drug use.  Wear Seat belts while driving.   Please note  You were cared for by a hospitalist during your hospital stay. If you have any questions about your discharge medications or the care you received while you were in the hospital after you are discharged, you can call the unit and asked to speak with the hospitalist on call if the hospitalist that took care of you is not available. Once you are discharged, your primary care physician will handle any further medical issues. Please note that NO REFILLS for any discharge medications will be authorized once you are discharged, as it is imperative that you return to your primary care physician (or establish a relationship with a primary care physician if you do not have one) for your aftercare needs so that they can reassess your need for medications and monitor your lab values.   Increase activity slowly    Complete by:  As directed       Discharge Medications     Medication List    TAKE these medications   ALPRAZolam 0.5 MG tablet Commonly known as:   XANAX Take 0.5 mg by mouth 3 (three) times daily as needed for anxiety.   BREO ELLIPTA 100-25 MCG/INH Aepb Generic drug:  fluticasone furoate-vilanterol Inhale 1 puff into the lungs daily as needed (wheezing/shortness of breath).   DEXILANT 60 MG capsule Generic drug:  dexlansoprazole Take 60 mg by mouth daily.   diltiazem 360 MG 24 hr capsule Commonly known as:  CARDIZEM CD Take 1 capsule (360 mg total) by mouth daily.   ELIQUIS 5 MG Tabs tablet Generic drug:  apixaban TAKE 1 TABLET BY MOUTH TWICE DAILY   furosemide 40 MG tablet Commonly known as:  LASIX Take 1 tablet (40 mg total) by mouth daily.   glipiZIDE 10 MG tablet Commonly known as:  GLUCOTROL Take 1 tablet (10 mg total) by mouth 2 (two) times daily before a meal. Do not take glipizide if blood sugar falls below 125.   HYDROcodone-acetaminophen 5-325 MG tablet Commonly known as:  NORCO/VICODIN Take 1 tablet by mouth 2 (two) times daily as needed for moderate pain.   insulin glargine 100 UNIT/ML injection Commonly known as:  LANTUS Inject 0.15 mLs (15 Units total) into the skin 2 (two) times daily.   Insulin Syringes (Disposable) U-100 1 ML Misc 15 Units by Does not apply route 2 (two) times daily.   levalbuterol 0.63 MG/3ML nebulizer solution Commonly known as:  XOPENEX Take 3 mLs (0.63 mg total) by nebulization every 3 (three) hours as needed for wheezing or shortness of breath.   levothyroxine 88 MCG tablet Commonly known as:  SYNTHROID, LEVOTHROID Take 88 mcg by mouth daily before breakfast.   meclizine 25 MG tablet Commonly known as:  ANTIVERT Take 12.5 mg by mouth daily as needed for dizziness.   metoprolol tartrate 25 MG tablet Commonly known as:  LOPRESSOR Take 25 mg by mouth 2 (two) times daily.   nitroGLYCERIN 0.4 MG SL tablet Commonly known as:  NITROSTAT Place 1 tablet (0.4 mg total) under the tongue every 5 (five) minutes x 3 doses as needed for chest pain. For severe chest pain    OXYGEN Inhale 2-3 L into the lungs continuous.   predniSONE 5 MG tablet Commonly known as:  DELTASONE Label  & dispense according to the schedule below. 10 Pills PO for 3 days then, 8 Pills PO for 3 days, 6  Pills PO for 3 days, 4 Pills PO for 3 days, 2 Pills PO for 3 days, 1 Pills PO for 3 days, 1/2 Pill  PO for 3 days then STOP. Total 95 pills.   rosuvastatin 10 MG tablet Commonly known as:  CRESTOR Take 1 tablet (10 mg total) by mouth daily. What changed:  when to take this   spironolactone 25 MG tablet Commonly known as:  ALDACTONE Take 0.5 tablets (12.5 mg total) by mouth daily. Start taking on:  01/16/2016   Vitamin D (Ergocalciferol) 50000 units Caps capsule Commonly known as:  DRISDOL Take 50,000 Units by mouth every 7 (seven) days. Takes on Mondays.            Durable Medical Equipment        Start     Ordered   01/06/16 1428  For home use only DME Other see comment  Once    Comments:  Evaluate for portable concentrator and Simply Go Mini   01/06/16 1427      Follow-up Information    Dhruv B Vyas, MD. Schedule an appointment as soon as possible for a visit in 3 day(s).   Specialty:  Internal Medicine Contact information: [address] [place] [address] [phone]        HAWKINS,EDWARD L, MD. Schedule an appointment as soon as possible for a visit in 1 week(s).   Specialty:  Pulmonary Disease Contact information: [address]  [place] [address] [phone]           Major procedures and Radiology Reports - PLEASE review detailed and final reports thoroughly  -      TTE -  - Left ventricle: The cavity size was normal. Systolic function was normal. The estimated ejection fraction was in the range of 60%to 65%. Wall motion was normal; there were no regional wallmotion abnormalities. The study is not technically sufficient toallow evaluation of LV diastolic function. - Mitral valve: There was mild regurgitation. - Left  atrium: The atrium was mildly dilated. - Right atrium: The atrium was mildly dilated. - Atrial septum: No defect or patent foramen ovale was identified. - Pulmonary arteries: PA peak pressure: 69 mm Hg (S).   Dg Chest Port 1 View  Result Date: 01/09/2016 CLINICAL DATA:  Patient with worsening shortness of breath. Follow-up chest radiograph. EXAM: PORTABLE CHEST 1 VIEW COMPARISON:  Chest radiograph 01/08/2016. FINDINGS: Monitoring leads overlie the patient. Stable enlarged cardiac and mediastinal contours with calcification of the thoracic aorta. Pulmonary vascular redistribution. Persistent small layering right pleural effusion with underlying pulmonary consolidation. No pneumothorax. IMPRESSION: Persistent small right pleural effusion and consolidative opacities within the right lower lobe which may represent pneumonia and/or atelectasis. Pulmonary vascular redistribution. Aortic atherosclerosis. Electronically Signed   By: Annia Belt M.D.   On: 01/09/2016 09:05   Dg Chest Port 1 View  Result Date: 01/08/2016 CLINICAL DATA:  Respiratory failure. History of COPD, former smoker, coronary artery disease, and atrial fibrillation. EXAM: PORTABLE CHEST 1 VIEW COMPARISON:  Portable chest x-ray of January 05, 2016 FINDINGS: There remains a small right pleural effusion. Infiltrate at the right lung base persists. The interstitial markings of both lungs are increased. There is no left pleural effusion. The heart is mildly enlarged. The central pulmonary vascularity is prominent. There is calcification within the wall of the aortic arch. The observed bony thorax is unremarkable. IMPRESSION: Right lower lobe atelectasis or pneumonia with small right pleural effusion. CHF with mild pulmonary interstitial edema which  has become more conspicuous since the earlier study. Aortic atherosclerosis. Electronically Signed   By: David  [place] M.D.   On: 01/08/2016 09:21   Dg Chest Port 1 View  Result Date:  01/05/2016 CLINICAL DATA:  Shortness of breath.  Recent diagnosis of pneumonia. EXAM: PORTABLE CHEST 1 VIEW COMPARISON:  12/09/2015 FINDINGS: Cardiac enlargement without vascular congestion. Small right pleural effusion with consolidation in the right lung base, increasing since prior study. Findings suggest progression of pneumonia. Left lung is clear. No pneumothorax. Calcified and tortuous aorta. Degenerative changes in the shoulders. IMPRESSION: Small right pleural effusion with increasing consolidation in the right lung base suggesting progressing pneumonia. Followup PA and lateral chest X-ray is recommended in 3-4 weeks following appropriate clinical therapy to ensure resolution and exclude underlying malignancy. Electronically Signed   By: Burman Nieves M.D.   On: 01/05/2016 04:52    Micro Results     Recent Results (from the past 240 hour(s))  Culture, blood (routine x 2) Call MD if unable to obtain prior to antibiotics being given     Status: None   Collection Time: 01/05/16  9:54 AM  Result Value Ref Range Status   Specimen Description BLOOD RIGHT ANTECUBITAL  Final   Special Requests BOTTLES DRAWN AEROBIC AND ANAEROBIC 10 cc each  Final   Culture NO GROWTH 5 DAYS  Final   Report Status 01/10/2016 FINAL  Final  Culture, blood (routine x 2) Call MD if unable to obtain prior to antibiotics being given     Status: None   Collection Time: 01/05/16 10:03 AM  Result Value Ref Range Status   Specimen Description BLOOD BLOOD LEFT HAND  Final   Special Requests BOTTLES DRAWN AEROBIC AND ANAEROBIC 6 cc each  Final   Culture NO GROWTH 5 DAYS  Final   Report Status 01/10/2016 FINAL  Final  Culture, sputum-assessment     Status: None   Collection Time: 01/05/16  3:00 PM  Result Value Ref Range Status   Specimen Description EXPECTORATED SPUTUM  Final   Special Requests NONE  Final   Sputum evaluation   Final    THIS SPECIMEN IS ACCEPTABLE. RESPIRATORY CULTURE REPORT TO FOLLOW. Performed at  [facility]    Report Status 01/05/2016 FINAL  Final  Culture, respiratory (NON-Expectorated)     Status: None   Collection Time: 01/05/16  3:00 PM  Result Value Ref Range Status   Specimen Description EXPECTORATED SPUTUM  Final   Special Requests NONE  Final   Gram Stain   Final    ABUNDANT WBC PRESENT, PREDOMINANTLY PMN ABUNDANT GRAM POSITIVE COCCI IN PAIRS IN CLUSTERS MODERATE GRAM NEGATIVE RODS FEW GRAM NEGATIVE COCCOBACILLI FEW GRAM POSITIVE RODS FEW GRAM VARIABLE ROD FEW SQUAMOUS EPITHELIAL CELLS PRESENT FEW GRAM NEGATIVE DIPLOCOCCI    Culture   Final    Consistent with normal respiratory flora. Performed at [facility]    Report Status 01/08/2016 FINAL  Final    Today   Subjective    Mickeal Skinner today has no headache,no chest abdominal pain,no new weakness tingling or numbness, feels much better wants to go home today.    Objective   Blood pressure 108/61, pulse (!) 101, temperature 98.2 F (36.8 C), temperature source Oral, resp. rate 15, height 5\' 2"  (1.575 m), weight 71.2 kg (156 lb 15.5 oz), SpO2 97 %.   Intake/Output Summary (Last 24 hours) at 01/15/16 0939 Last data filed at 01/15/16 0659  Gross per 24 hour  Intake  0 ml  Output             2350 ml  Net            -2350 ml    Exam Awake Alert, Oriented x 3, No new F.N deficits, Normal affect [place].AT,PERRAL Supple Neck,No JVD, No cervical lymphadenopathy appriciated.  Symmetrical Chest wall movement, Good air movement bilaterally, minimal wheezes RRR,No Gallops,Rubs or new Murmurs, No Parasternal Heave +ve B.Sounds, Abd Soft, Non tender, No organomegaly appriciated, No rebound -guarding or rigidity. No Cyanosis, Clubbing or edema, No new Rash or bruise   Data Review   CBC w Diff: Lab Results  Component Value Date   WBC 9.8 01/12/2016   HGB 11.3 (L) 01/12/2016   HCT 39.0 01/12/2016   PLT 150 01/12/2016   LYMPHOPCT 10 01/05/2016   MONOPCT 6 01/05/2016   EOSPCT 1  01/05/2016   BASOPCT 0 01/05/2016    CMP: Lab Results  Component Value Date   NA 138 01/12/2016   K 4.2 01/12/2016   CL 83 (L) 01/12/2016   CO2 48 (H) 01/12/2016   BUN 43 (H) 01/12/2016   CREATININE 0.99 01/12/2016   PROT 5.6 (L) 01/06/2016   ALBUMIN 3.1 (L) 01/06/2016   BILITOT 1.2 01/06/2016   ALKPHOS 93 01/06/2016   AST 21 01/06/2016   ALT 43 01/06/2016  .   Total Time in preparing paper work, data evaluation and todays exam - 35 minutes  Leroy SeaSINGH,Dontavious Emily K M.D on 01/15/2016 at 9:39 AM  [facility]   Office  [phone]

## 2016-01-15 NOTE — Discharge Instructions (Signed)
Follow with Primary MD Ignatius Speckinghruv B Vyas, MD in 2-3 days   Get CBC, CMP, 2 view Chest X ray checked  by Primary MD in 2-3 days ( we routinely change or add medications that can affect your baseline labs and fluid status, therefore we recommend that you get the mentioned basic workup next visit with your PCP, your PCP may decide not to get them or add new tests based on their clinical decision)   Activity: As tolerated with Full fall precautions use walker/cane & assistance as needed   Disposition Home     Diet:   Heart healthy low carbohydrate. Check your Weight same time everyday, if you gain over 2 pounds, or you develop in leg swelling, experience more shortness of breath or chest pain, call your Primary MD immediately. Follow Cardiac Low Salt Diet and 1.5 lit/day fluid restriction.  Accuchecks 4 times/day, Once in AM empty stomach and then before each meal. Log in all results and show them to your Prim.MD in 3 days. If any glucose reading is under 80 or above 300 call your Prim MD immidiately. Follow Low glucose instructions for glucose under 80 as instructed.  On your next visit with your primary care physician please Get Medicines reviewed and adjusted.   Please request your Prim.MD to go over all Hospital Tests and Procedure/Radiological results at the follow up, please get all Hospital records sent to your Prim MD by signing hospital release before you go home.   If you experience worsening of your admission symptoms, develop shortness of breath, life threatening emergency, suicidal or homicidal thoughts you must seek medical attention immediately by calling 911 or calling your MD immediately  if symptoms less severe.  You Must read complete instructions/literature along with all the possible adverse reactions/side effects for all the Medicines you take and that have been prescribed to you. Take any new Medicines after you have completely understood and accpet all the possible adverse  reactions/side effects.   Do not drive, operate heavy machinery, perform activities at heights, swimming or participation in water activities or provide baby sitting services if your were admitted for syncope or siezures until you have seen by Primary MD or a Neurologist and advised to do so again.  Do not drive when taking Pain medications.    Do not take more than prescribed Pain, Sleep and Anxiety Medications  Special Instructions: If you have smoked or chewed Tobacco  in the last 2 yrs please stop smoking, stop any regular Alcohol  and or any Recreational drug use.  Wear Seat belts while driving.   Please note  You were cared for by a hospitalist during your hospital stay. If you have any questions about your discharge medications or the care you received while you were in the hospital after you are discharged, you can call the unit and asked to speak with the hospitalist on call if the hospitalist that took care of you is not available. Once you are discharged, your primary care physician will handle any further medical issues. Please note that NO REFILLS for any discharge medications will be authorized once you are discharged, as it is imperative that you return to your primary care physician (or establish a relationship with a primary care physician if you do not have one) for your aftercare needs so that they can reassess your need for medications and monitor your lab values.

## 2016-01-15 NOTE — Progress Notes (Signed)
New order per Dr.Singh to drop patient's O2 via [place] to 3L via [place]. Erie NoeVanessa, RT made aware & patient's O2 dropped to 3L [place]. Will continue to monitor.

## 2016-01-15 NOTE — Care Management (Signed)
CM has spoke with patient and patient's daughter. Patient will discharge home today with home health PT. She will continue her oxygen at home and [facility] is working to get patient NIV at home. Patient and daughter have been explained all this in detail. Everyone agreeable with this plan. Patient's daughter will come to pick up patient and bring O2 tank for transport. RN updated.

## 2016-01-28 ENCOUNTER — Institutional Professional Consult (permissible substitution): Payer: Medicare Other | Admitting: Pulmonary Disease

## 2016-02-02 ENCOUNTER — Ambulatory Visit (INDEPENDENT_AMBULATORY_CARE_PROVIDER_SITE_OTHER): Payer: Medicare Other | Admitting: Internal Medicine

## 2016-02-08 ENCOUNTER — Ambulatory Visit: Payer: Medicare Other | Admitting: Cardiology

## 2016-03-02 ENCOUNTER — Encounter (HOSPITAL_COMMUNITY): Payer: Self-pay

## 2016-03-02 ENCOUNTER — Inpatient Hospital Stay (HOSPITAL_COMMUNITY)
Admission: EM | Admit: 2016-03-02 | Discharge: 2016-03-08 | DRG: 186 | Disposition: A | Discharge status: Skilled Nursing Facility | Payer: Medicare Other | Attending: Internal Medicine | Admitting: Internal Medicine

## 2016-03-02 ENCOUNTER — Emergency Department (HOSPITAL_COMMUNITY): Payer: Medicare Other

## 2016-03-02 DIAGNOSIS — Z794 Long term (current) use of insulin: Secondary | ICD-10-CM

## 2016-03-02 DIAGNOSIS — I5032 Chronic diastolic (congestive) heart failure: Secondary | ICD-10-CM | POA: Diagnosis present

## 2016-03-02 DIAGNOSIS — R609 Edema, unspecified: Secondary | ICD-10-CM

## 2016-03-02 DIAGNOSIS — J441 Chronic obstructive pulmonary disease with (acute) exacerbation: Secondary | ICD-10-CM | POA: Diagnosis present

## 2016-03-02 DIAGNOSIS — J42 Unspecified chronic bronchitis: Secondary | ICD-10-CM | POA: Diagnosis not present

## 2016-03-02 DIAGNOSIS — I248 Other forms of acute ischemic heart disease: Secondary | ICD-10-CM | POA: Diagnosis present

## 2016-03-02 DIAGNOSIS — Z9981 Dependence on supplemental oxygen: Secondary | ICD-10-CM | POA: Diagnosis not present

## 2016-03-02 DIAGNOSIS — Z85820 Personal history of malignant melanoma of skin: Secondary | ICD-10-CM | POA: Diagnosis not present

## 2016-03-02 DIAGNOSIS — Z808 Family history of malignant neoplasm of other organs or systems: Secondary | ICD-10-CM | POA: Diagnosis not present

## 2016-03-02 DIAGNOSIS — J9621 Acute and chronic respiratory failure with hypoxia: Secondary | ICD-10-CM | POA: Diagnosis present

## 2016-03-02 DIAGNOSIS — J449 Chronic obstructive pulmonary disease, unspecified: Secondary | ICD-10-CM | POA: Diagnosis present

## 2016-03-02 DIAGNOSIS — E118 Type 2 diabetes mellitus with unspecified complications: Secondary | ICD-10-CM

## 2016-03-02 DIAGNOSIS — Z800 Family history of malignant neoplasm of digestive organs: Secondary | ICD-10-CM

## 2016-03-02 DIAGNOSIS — R600 Localized edema: Secondary | ICD-10-CM | POA: Diagnosis present

## 2016-03-02 DIAGNOSIS — Z79899 Other long term (current) drug therapy: Secondary | ICD-10-CM

## 2016-03-02 DIAGNOSIS — J90 Pleural effusion, not elsewhere classified: Principal | ICD-10-CM

## 2016-03-02 DIAGNOSIS — M6281 Muscle weakness (generalized): Secondary | ICD-10-CM

## 2016-03-02 DIAGNOSIS — Z8249 Family history of ischemic heart disease and other diseases of the circulatory system: Secondary | ICD-10-CM

## 2016-03-02 DIAGNOSIS — E785 Hyperlipidemia, unspecified: Secondary | ICD-10-CM | POA: Diagnosis present

## 2016-03-02 DIAGNOSIS — Z90710 Acquired absence of both cervix and uterus: Secondary | ICD-10-CM | POA: Diagnosis not present

## 2016-03-02 DIAGNOSIS — E1165 Type 2 diabetes mellitus with hyperglycemia: Secondary | ICD-10-CM | POA: Diagnosis present

## 2016-03-02 DIAGNOSIS — Z9889 Other specified postprocedural states: Secondary | ICD-10-CM

## 2016-03-02 DIAGNOSIS — D899 Disorder involving the immune mechanism, unspecified: Secondary | ICD-10-CM | POA: Diagnosis present

## 2016-03-02 DIAGNOSIS — I4891 Unspecified atrial fibrillation: Secondary | ICD-10-CM | POA: Diagnosis not present

## 2016-03-02 DIAGNOSIS — E039 Hypothyroidism, unspecified: Secondary | ICD-10-CM | POA: Diagnosis present

## 2016-03-02 DIAGNOSIS — Z7901 Long term (current) use of anticoagulants: Secondary | ICD-10-CM

## 2016-03-02 DIAGNOSIS — Z955 Presence of coronary angioplasty implant and graft: Secondary | ICD-10-CM

## 2016-03-02 DIAGNOSIS — E11649 Type 2 diabetes mellitus with hypoglycemia without coma: Secondary | ICD-10-CM | POA: Diagnosis present

## 2016-03-02 DIAGNOSIS — I2510 Atherosclerotic heart disease of native coronary artery without angina pectoris: Secondary | ICD-10-CM | POA: Diagnosis present

## 2016-03-02 DIAGNOSIS — I429 Cardiomyopathy, unspecified: Secondary | ICD-10-CM | POA: Diagnosis present

## 2016-03-02 DIAGNOSIS — Z87891 Personal history of nicotine dependence: Secondary | ICD-10-CM

## 2016-03-02 DIAGNOSIS — IMO0002 Reserved for concepts with insufficient information to code with codable children: Secondary | ICD-10-CM | POA: Diagnosis present

## 2016-03-02 DIAGNOSIS — I110 Hypertensive heart disease with heart failure: Secondary | ICD-10-CM | POA: Diagnosis present

## 2016-03-02 DIAGNOSIS — K5900 Constipation, unspecified: Secondary | ICD-10-CM | POA: Diagnosis present

## 2016-03-02 DIAGNOSIS — L8990 Pressure ulcer of unspecified site, unspecified stage: Secondary | ICD-10-CM | POA: Diagnosis present

## 2016-03-02 DIAGNOSIS — Z09 Encounter for follow-up examination after completed treatment for conditions other than malignant neoplasm: Secondary | ICD-10-CM

## 2016-03-02 DIAGNOSIS — Z7982 Long term (current) use of aspirin: Secondary | ICD-10-CM

## 2016-03-02 DIAGNOSIS — R0602 Shortness of breath: Secondary | ICD-10-CM | POA: Diagnosis present

## 2016-03-02 DIAGNOSIS — Z7951 Long term (current) use of inhaled steroids: Secondary | ICD-10-CM

## 2016-03-02 LAB — CBC
HEMATOCRIT: 38.8 % (ref 36.0–46.0)
HEMOGLOBIN: 11.9 g/dL — AB (ref 12.0–15.0)
MCH: 29.3 pg (ref 26.0–34.0)
MCHC: 30.7 g/dL (ref 30.0–36.0)
MCV: 95.6 fL (ref 78.0–100.0)
Platelets: 371 10*3/uL (ref 150–400)
RBC: 4.06 MIL/uL (ref 3.87–5.11)
RDW: 18.5 % — ABNORMAL HIGH (ref 11.5–15.5)
WBC: 15.9 10*3/uL — ABNORMAL HIGH (ref 4.0–10.5)

## 2016-03-02 LAB — BASIC METABOLIC PANEL
ANION GAP: 8 (ref 5–15)
BUN: 25 mg/dL — ABNORMAL HIGH (ref 6–20)
CO2: 28 mmol/L (ref 22–32)
Calcium: 8.6 mg/dL — ABNORMAL LOW (ref 8.9–10.3)
Chloride: 96 mmol/L — ABNORMAL LOW (ref 101–111)
Creatinine, Ser: 1.05 mg/dL — ABNORMAL HIGH (ref 0.44–1.00)
GFR calc Af Amer: 59 mL/min — ABNORMAL LOW (ref >60)
GFR, EST NON AFRICAN AMERICAN: 51 mL/min — AB (ref >60)
GLUCOSE: 263 mg/dL — AB (ref 65–99)
POTASSIUM: 3.6 mmol/L (ref 3.5–5.1)
Sodium: 132 mmol/L — ABNORMAL LOW (ref 135–145)

## 2016-03-02 LAB — D-DIMER, QUANTITATIVE (NOT AT ARMC): D DIMER QUANT: 2.16 ug{FEU}/mL — AB (ref 0.00–0.50)

## 2016-03-02 LAB — TROPONIN I
TROPONIN I: 0.04 ng/mL — AB (ref <0.03)
TROPONIN I: 0.04 ng/mL — AB (ref <0.03)
TROPONIN I: 0.03 ng/mL — AB (ref <0.03)

## 2016-03-02 LAB — LACTIC ACID, PLASMA
LACTIC ACID, VENOUS: 1.5 mmol/L (ref 0.5–1.9)
Lactic Acid, Venous: 1.4 mmol/L (ref 0.5–1.9)

## 2016-03-02 LAB — BRAIN NATRIURETIC PEPTIDE: B Natriuretic Peptide: 263 pg/mL — ABNORMAL HIGH (ref 0.0–100.0)

## 2016-03-02 LAB — GLUCOSE, CAPILLARY: Glucose-Capillary: 344 mg/dL — ABNORMAL HIGH (ref 65–99)

## 2016-03-02 LAB — MRSA PCR SCREENING: MRSA BY PCR: NEGATIVE

## 2016-03-02 MED ORDER — ACETAMINOPHEN 325 MG PO TABS: 650.0000 mg | ORAL_TABLET | ORAL | Status: DC | PRN

## 2016-03-02 MED ORDER — APIXABAN 5 MG PO TABS
5.0000 mg | ORAL_TABLET | Freq: Two times a day (BID) | ORAL | Status: DC
  Administered 2016-03-02 – 2016-03-08 (×12): 5 mg via ORAL
  Filled 2016-03-02 (×12): qty 1

## 2016-03-02 MED ORDER — INSULIN ASPART 100 UNIT/ML ~~LOC~~ SOLN
0.0000 [IU] | Freq: Every day | SUBCUTANEOUS | Status: DC
  Administered 2016-03-02: 4 [IU] via SUBCUTANEOUS
  Administered 2016-03-03: 3 [IU] via SUBCUTANEOUS
  Administered 2016-03-04: 2 [IU] via SUBCUTANEOUS
  Administered 2016-03-05 – 2016-03-06 (×2): 3 [IU] via SUBCUTANEOUS
  Administered 2016-03-07: 2 [IU] via SUBCUTANEOUS

## 2016-03-02 MED ORDER — INSULIN ASPART 100 UNIT/ML ~~LOC~~ SOLN
0.0000 [IU] | Freq: Three times a day (TID) | SUBCUTANEOUS | Status: DC
  Administered 2016-03-03: 11 [IU] via SUBCUTANEOUS

## 2016-03-02 MED ORDER — ALPRAZOLAM 0.5 MG PO TABS
0.5000 mg | ORAL_TABLET | Freq: Three times a day (TID) | ORAL | Status: DC | PRN
  Administered 2016-03-02 – 2016-03-07 (×10): 0.5 mg via ORAL
  Filled 2016-03-02 (×10): qty 1

## 2016-03-02 MED ORDER — HYDROCODONE-ACETAMINOPHEN 5-325 MG PO TABS
1.0000 | ORAL_TABLET | Freq: Two times a day (BID) | ORAL | Status: DC | PRN
  Filled 2016-03-02: qty 1

## 2016-03-02 MED ORDER — METHYLPREDNISOLONE SODIUM SUCC 125 MG IJ SOLR
60.0000 mg | Freq: Two times a day (BID) | INTRAMUSCULAR | Status: DC
  Administered 2016-03-02 – 2016-03-04 (×4): 60 mg via INTRAVENOUS
  Filled 2016-03-02 (×4): qty 2

## 2016-03-02 MED ORDER — MECLIZINE HCL 12.5 MG PO TABS: 12.5000 mg | ORAL_TABLET | Freq: Every day | ORAL | Status: DC | PRN

## 2016-03-02 MED ORDER — DILTIAZEM HCL 25 MG/5ML IV SOLN
10.0000 mg | Freq: Once | INTRAVENOUS | Status: AC
  Administered 2016-03-02: 10 mg via INTRAVENOUS
  Filled 2016-03-02: qty 5

## 2016-03-02 MED ORDER — POTASSIUM CHLORIDE CRYS ER 20 MEQ PO TBCR
20.0000 meq | EXTENDED_RELEASE_TABLET | Freq: Every day | ORAL | Status: DC
  Administered 2016-03-03 – 2016-03-08 (×6): 20 meq via ORAL
  Filled 2016-03-02 (×6): qty 1

## 2016-03-02 MED ORDER — IOPAMIDOL (ISOVUE-370) INJECTION 76%
100.0000 mL | Freq: Once | INTRAVENOUS | Status: AC | PRN
  Administered 2016-03-02: 100 mL via INTRAVENOUS

## 2016-03-02 MED ORDER — DILTIAZEM HCL 100 MG IV SOLR
5.0000 mg/h | INTRAVENOUS | Status: DC
  Administered 2016-03-02: 15 mg/h via INTRAVENOUS
  Administered 2016-03-03: 12.5 mg/h via INTRAVENOUS
  Administered 2016-03-03 – 2016-03-04 (×4): 15 mg/h via INTRAVENOUS
  Administered 2016-03-04: 7.5 mg/h via INTRAVENOUS
  Administered 2016-03-05 – 2016-03-06 (×4): 10 mg/h via INTRAVENOUS
  Filled 2016-03-02 (×11): qty 100

## 2016-03-02 MED ORDER — DILTIAZEM HCL 100 MG IV SOLR
10.0000 mg/h | Freq: Once | INTRAVENOUS | Status: AC
  Administered 2016-03-02: 10 mg/h via INTRAVENOUS
  Filled 2016-03-02: qty 100

## 2016-03-02 MED ORDER — INSULIN GLARGINE 100 UNIT/ML ~~LOC~~ SOLN
15.0000 [IU] | Freq: Two times a day (BID) | SUBCUTANEOUS | Status: DC
  Administered 2016-03-02: 15 [IU] via SUBCUTANEOUS
  Filled 2016-03-02 (×4): qty 0.15

## 2016-03-02 MED ORDER — INSULIN GLARGINE 100 UNIT/ML ~~LOC~~ SOLN
SUBCUTANEOUS | Status: AC
  Filled 2016-03-02: qty 10

## 2016-03-02 MED ORDER — FUROSEMIDE 40 MG PO TABS
40.0000 mg | ORAL_TABLET | Freq: Every day | ORAL | Status: DC
  Administered 2016-03-03 – 2016-03-07 (×5): 40 mg via ORAL
  Filled 2016-03-02 (×5): qty 1

## 2016-03-02 MED ORDER — ASPIRIN 81 MG PO CHEW
81.0000 mg | CHEWABLE_TABLET | Freq: Every day | ORAL | Status: DC
  Administered 2016-03-03 – 2016-03-08 (×6): 81 mg via ORAL
  Filled 2016-03-02 (×6): qty 1

## 2016-03-02 MED ORDER — LEVOFLOXACIN 500 MG PO TABS
500.0000 mg | ORAL_TABLET | Freq: Every day | ORAL | Status: DC
Start: 2016-03-03 — End: 2016-03-08
  Administered 2016-03-03 – 2016-03-08 (×6): 500 mg via ORAL
  Filled 2016-03-02 (×6): qty 1

## 2016-03-02 MED ORDER — ALBUTEROL SULFATE (2.5 MG/3ML) 0.083% IN NEBU
2.5000 mg | INHALATION_SOLUTION | RESPIRATORY_TRACT | Status: DC | PRN
  Administered 2016-03-05 – 2016-03-06 (×3): 2.5 mg via RESPIRATORY_TRACT
  Filled 2016-03-02 (×3): qty 3

## 2016-03-02 MED ORDER — PANTOPRAZOLE SODIUM 40 MG PO TBEC
80.0000 mg | DELAYED_RELEASE_TABLET | Freq: Every day | ORAL | Status: DC
  Administered 2016-03-03 – 2016-03-08 (×6): 80 mg via ORAL
  Filled 2016-03-02 (×6): qty 2

## 2016-03-02 MED ORDER — LEVOTHYROXINE SODIUM 100 MCG PO TABS
100.0000 ug | ORAL_TABLET | Freq: Every day | ORAL | Status: DC
  Administered 2016-03-03 – 2016-03-08 (×6): 100 ug via ORAL
  Filled 2016-03-02 (×6): qty 1

## 2016-03-02 MED ORDER — ROSUVASTATIN CALCIUM 10 MG PO TABS
10.0000 mg | ORAL_TABLET | Freq: Every day | ORAL | Status: DC
  Administered 2016-03-02 – 2016-03-07 (×6): 10 mg via ORAL
  Filled 2016-03-02 (×6): qty 1

## 2016-03-02 MED ORDER — METOPROLOL TARTRATE 25 MG PO TABS
25.0000 mg | ORAL_TABLET | Freq: Every day | ORAL | Status: DC
Start: 2016-03-03 — End: 2016-03-05
  Administered 2016-03-03 – 2016-03-05 (×3): 25 mg via ORAL
  Filled 2016-03-02 (×3): qty 1

## 2016-03-02 MED ORDER — SPIRONOLACTONE 25 MG PO TABS
12.5000 mg | ORAL_TABLET | Freq: Every day | ORAL | Status: DC
  Administered 2016-03-03 – 2016-03-08 (×6): 12.5 mg via ORAL
  Filled 2016-03-02 (×6): qty 1

## 2016-03-02 MED ORDER — ONDANSETRON HCL 4 MG/2ML IJ SOLN: 4.0000 mg | Freq: Four times a day (QID) | INTRAMUSCULAR | Status: DC | PRN

## 2016-03-02 MED ORDER — IPRATROPIUM-ALBUTEROL 0.5-2.5 (3) MG/3ML IN SOLN
3.0000 mL | Freq: Four times a day (QID) | RESPIRATORY_TRACT | Status: DC
  Administered 2016-03-02 – 2016-03-05 (×12): 3 mL via RESPIRATORY_TRACT
  Filled 2016-03-02 (×13): qty 3

## 2016-03-02 NOTE — ED Triage Notes (Signed)
EMS states patient with COPD, worsening SHOB over the past few days, used Duoneb x2 with no relief, initial o2 sat 90 on @ 2.5lpm - up to 96% - EMS placed patient on CPAP per her request for relief of symptoms. Pt is in AFIB @ 130. EMS also gave Solumedrol 125mg . 20RAC placed by EMS.

## 2016-03-02 NOTE — ED Notes (Signed)
CRITICAL VALUE ALERT  Critical value received:  Troponin 0.04  Date of notification:  03/02/16  Time of notification:  1340  Critical value read back:YES  Nurse who received alert:  Jeannine KittenHaskins RN   MD notified (1st page):  Jacubowitz  Time of first page:  1340  MD notified (2nd page):  Time of second page:  Responding MD:  Ethelda ChickJacubowitz  Time MD responded:  1340

## 2016-03-02 NOTE — ED Provider Notes (Signed)
[facility]-EMERGENCY DEPT Provider Note   CSN: [id number] Arrival date & time: 03/02/16  1237  By signing my name below, I, Clovis PuAvnee Patel, attest that this documentation has been prepared under the direction and in the presence of Doug SouSam Nabeeha Badertscher, MD  Electronically Signed: Clovis PuAvnee Patel, ED Scribe. 03/02/16. 1:08 PM.   History   Chief Complaint Chief Complaint  Patient presents with  . Shortness of Breath   The history is provided by the patient. No language interpreter was used.   HPI Comments:  [name] [name] is a 76 y.o. female, with a hx of COPD, Dm, a-fib and CAD, who presents to the Emergency Department complaining  SOB which began around 11:30 AM today. She also reports a subjective fever, a dry cough, nasal congestion and nausea which began around 11:30 AM today. Pt reports she is on oxygen at home (2-3 liters). She tried a home duo nebulizer treatment with no relief. Pt was also given a duo treatment and CPAP by EMS personal which provided  relief. She presently states she is breathing at baseline. Pt denies a hx of blood clots, blood in her stool, any other associated symptoms and modifying factors at this time. Pt state she quit smoking 1 year ago. Pt is on Eliquis.  PCP: Ignatius Speckinghruv B Vyas, MD  Past Medical History:  Diagnosis Date  . CAD S/P percutaneous coronary angioplasty 2005, 2012   RCA DES,CFX DES-low risk Nuc June 2016  . Cardiomyopathy (HCC)    EF 65-70% Aug 2016 echo  . Carotid artery disease (HCC)   . Chronic a-fib (HCC)    Eliquis started July 2016  . COPD (chronic obstructive pulmonary disease) (HCC)    Home O2  . Essential hypertension, benign   . Hyperlipidemia   . Hypothyroidism   . Sinus bradycardia    HR 30-40s with associated lightheadedness/presyncoe. AVN blockers held with improvement.  . Type 2 diabetes mellitus (HCC)   Home oxygen 3 L  Patient Active Problem List   Diagnosis Date Noted  . 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 HCAP  (healthcare-associated pneumonia) 01/05/2016  . COPD (chronic obstructive pulmonary disease) (HCC) 12/09/2015  . Diastolic CHF, acute on chronic (HCC)   . Uncontrolled type 2 diabetes mellitus with complication (HCC)   . Uncontrolled type 2 diabetes mellitus with complication, without long-term current use of insulin (HCC)   . Acute on chronic respiratory failure with hypoxia (HCC)   . CAP (community acquired pneumonia)   . Acute on chronic diastolic CHF (congestive heart failure) (HCC)   . Other specified hypothyroidism   . Chronic atrial fibrillation (HCC) 09/07/2015  . Atrial fibrillation with RVR (HCC) 09/07/2015  . Chronic anticoagulation-CHADs VASc=6 09/07/2015  . Type 2 diabetes with decreased circulation (HCC) 09/07/2015  . Sinus bradycardia 09/07/2012  . Paroxysmal atrial fibrillation (HCC) 09/07/2012  . TOBACCO ABUSE 04/09/2009  . CAROTID ARTERY DISEASE 04/09/2009  . COPD with acute exacerbation (HCC) 04/09/2009  . Dyslipidemia 11/20/2008  . Essential hypertension, benign 11/20/2008  . CAD S/P percutaneous coronary angioplasty 11/20/2008    Past Surgical History:  Procedure Laterality Date  . ABDOMINAL HYSTERECTOMY    . APPENDECTOMY    . CHOLECYSTECTOMY    . Excision of Melanoma  04/29/2010   Lower left neck    OB History    Gravida Para Term Preterm AB Living   2 2 2          SAB TAB Ectopic Multiple Live Births  Home Medications    Prior to Admission medications   Medication Sig Start Date End Date Taking? Authorizing Provider  ALPRAZolam Prudy Feeler) 0.5 MG tablet Take 0.5 mg by mouth 3 (three) times daily as needed for anxiety.    Historical Provider, MD  dexlansoprazole (DEXILANT) 60 MG capsule Take 60 mg by mouth daily.      Historical Provider, MD  diltiazem (CARDIZEM CD) 360 MG 24 hr capsule Take 1 capsule (360 mg total) by mouth daily. 09/11/15   Drema Dallas, MD  ELIQUIS 5 MG TABS tablet TAKE 1 TABLET BY MOUTH TWICE DAILY 09/30/14   Rollene Rotunda, MD  Fluticasone Furoate-Vilanterol (BREO ELLIPTA) 100-25 MCG/INH AEPB Inhale 1 puff into the lungs daily as needed (wheezing/shortness of breath).     Historical Provider, MD  furosemide (LASIX) 40 MG tablet Take 1 tablet (40 mg total) by mouth daily. 11/28/15   Houston Siren, MD  glipiZIDE (GLUCOTROL) 10 MG tablet Take 1 tablet (10 mg total) by mouth 2 (two) times daily before a meal. Do not take glipizide if blood sugar falls below 125. 12/11/15   Elliot Cousin, MD  HYDROcodone-acetaminophen (NORCO/VICODIN) 5-325 MG tablet Take 1 tablet by mouth 2 (two) times daily as needed for moderate pain.    Historical Provider, MD  insulin glargine (LANTUS) 100 UNIT/ML injection Inject 0.15 mLs (15 Units total) into the skin 2 (two) times daily. 12/11/15   Elliot Cousin, MD  Insulin Syringes, Disposable, U-100 1 ML MISC 15 Units by Does not apply route 2 (two) times daily. 12/11/15   Elliot Cousin, MD  levalbuterol Pauline Aus) 0.63 MG/3ML nebulizer solution Take 3 mLs (0.63 mg total) by nebulization every 3 (three) hours as needed for wheezing or shortness of breath. 09/11/15   Drema Dallas, MD  levothyroxine (SYNTHROID, LEVOTHROID) 88 MCG tablet Take 88 mcg by mouth daily before breakfast.    Historical Provider, MD  meclizine (ANTIVERT) 25 MG tablet Take 12.5 mg by mouth daily as needed for dizziness.    Historical Provider, MD  metoprolol tartrate (LOPRESSOR) 25 MG tablet Take 25 mg by mouth 2 (two) times daily.    Historical Provider, MD  nitroGLYCERIN (NITROSTAT) 0.4 MG SL tablet Place 1 tablet (0.4 mg total) under the tongue every 5 (five) minutes x 3 doses as needed for chest pain. For severe chest pain 08/07/14   Jonelle Sidle, MD  OXYGEN Inhale 2-3 L into the lungs continuous.    Historical Provider, MD  predniSONE (DELTASONE) 5 MG tablet Label  & dispense according to the schedule below. 10 Pills PO for 3 days then, 8 Pills PO for 3 days, 6 Pills PO for 3 days, 4 Pills PO for 3 days, 2 Pills PO  for 3 days, 1 Pills PO for 3 days, 1/2 Pill  PO for 3 days then STOP. Total 95 pills. 01/15/16   Leroy Sea, MD  rosuvastatin (CRESTOR) 10 MG tablet Take 1 tablet (10 mg total) by mouth daily. Patient taking differently: Take 10 mg by mouth at bedtime.  10/05/10   June Leap, MD  spironolactone (ALDACTONE) 25 MG tablet Take 0.5 tablets (12.5 mg total) by mouth daily. 01/16/16   Leroy Sea, MD  Vitamin D, Ergocalciferol, (DRISDOL) 50000 units CAPS capsule Take 50,000 Units by mouth every 7 (seven) days. Takes on Mondays.    Historical Provider, MD    Family History Family History  Problem Relation Age of Onset  . Colon cancer Mother   .  Throat cancer Father   . Heart disease Brother     Social History Social History  Substance Use Topics  . Smoking status: Former Smoker    Packs/day: 0.50    Years: 45.00    Types: Cigarettes    Start date: 02/29/1964    Quit date: 06/08/2012  . Smokeless tobacco: Never Used  . Alcohol use No     Allergies   Adalat [nifedipine]; Claritin-d 12 hour [loratadine-pseudoephedrine er]; Codeine; Plavix [clopidogrel bisulfate]; Wellbutrin [bupropion]; Ciprofloxacin; and Metformin and related   Review of Systems Review of Systems  HENT: Positive for congestion.   Respiratory: Positive for cough and shortness of breath.   Gastrointestinal: Positive for nausea.  Allergic/Immunologic: Positive for immunocompromised state.       Diabetic     Physical Exam Updated Vital Signs BP 139/71 (BP Location: Right Arm)   Pulse (!) 38   Temp 97.7 F (36.5 C) (Oral)   Resp (!) 36   Ht 5\' 3"  (1.6 m)   Wt 157 lb (71.2 kg)   SpO2 95%   BMI 27.81 kg/m   Physical Exam  Constitutional: She appears well-developed and well-nourished.  HENT:  Head: Normocephalic and atraumatic.  Eyes: Conjunctivae are normal. Pupils are equal, round, and reactive to light.  Neck: Neck supple. No tracheal deviation present. No thyromegaly present.  Cardiovascular:    No murmur heard. Tachycardic irregularly irregular  Pulmonary/Chest: Effort normal.  Scant diffuse rhonchi  Abdominal: Soft. Bowel sounds are normal. She exhibits no distension. There is no tenderness.  Musculoskeletal: Normal range of motion. She exhibits no edema or tenderness.  Neurological: She is alert. Coordination normal.  Skin: Skin is warm and dry. No rash noted.  Psychiatric: She has a normal mood and affect.  Nursing note and vitals reviewed.    ED Treatments / Results  DIAGNOSTIC STUDIES:  Oxygen Saturation is 95% on [place], normal by my interpretation.    COORDINATION OF CARE:  1:08 PM Discussed treatment plan with pt at bedside and pt agreed to plan.  Labs (all labs ordered are listed, but only abnormal results are displayed) Labs Reviewed  BASIC METABOLIC PANEL  CBC  TROPONIN I  LACTIC ACID, PLASMA  LACTIC ACID, PLASMA    EKG  EKG Interpretation  Date/Time:  Wednesday March 02 2016 12:42:50 EST Ventricular Rate:  122 PR Interval:    QRS Duration: 95 QT Interval:  318 QTC Calculation: 453 R Axis:   120 Text Interpretation:  Atrial fibrillation Left posterior fascicular block Low voltage, precordial leads Consider anterior infarct Borderline repolarization abnormality Since last tracing rate slower Confirmed by Ethelda Chick  MD, Ranson Belluomini [phone]) on 03/02/2016 1:07:56 PM       Radiology No results found.  Procedures Procedures (including critical care time)  Medications Ordered in ED Medications - No data to display  Chest x-ray viewed by me Results for orders placed or performed during the hospital encounter of 03/02/16  Basic metabolic panel  Result Value Ref Range   Sodium 132 (L) 135 - 145 mmol/L   Potassium 3.6 3.5 - 5.1 mmol/L   Chloride 96 (L) 101 - 111 mmol/L   CO2 28 22 - 32 mmol/L   Glucose, Bld 263 (H) 65 - 99 mg/dL   BUN 25 (H) 6 - 20 mg/dL   Creatinine, Ser 1.91 (H) 0.44 - 1.00 mg/dL   Calcium 8.6 (L) 8.9 - 10.3 mg/dL   GFR calc non  Af Amer 51 (L) >60 mL/min   GFR calc  Af Amer 59 (L) >60 mL/min   Anion gap 8 5 - 15  CBC  Result Value Ref Range   WBC 15.9 (H) 4.0 - 10.5 K/uL   RBC 4.06 3.87 - 5.11 MIL/uL   Hemoglobin 11.9 (L) 12.0 - 15.0 g/dL   HCT 40.9 81.1 - 91.4 %   MCV 95.6 78.0 - 100.0 fL   MCH 29.3 26.0 - 34.0 pg   MCHC 30.7 30.0 - 36.0 g/dL   RDW 78.2 (H) 95.6 - 21.3 %   Platelets 371 150 - 400 K/uL  Troponin I  Result Value Ref Range   Troponin I 0.04 (HH) <0.03 ng/mL  Lactic acid, plasma  Result Value Ref Range   Lactic Acid, Venous 1.5 0.5 - 1.9 mmol/L  D-dimer, quantitative (not at [facility])  Result Value Ref Range   D-Dimer, Quant 2.16 (H) 0.00 - 0.50 ug/mL-FEU  Brain natriuretic peptide  Result Value Ref Range   B Natriuretic Peptide 263.0 (H) 0.0 - 100.0 pg/mL   Dg Chest 2 View  Result Date: 03/02/2016 CLINICAL DATA:  COPD.  Worsening shortness of breath. EXAM: CHEST  2 VIEW COMPARISON:  01/09/2016. FINDINGS: Mediastinum hilar structures are unremarkable. Persistent cardiomegaly. Interval significant increase in right-sided pleural effusion. Large pleural effusion is now present. Underlying right lower lung disease cannot be excluded. No pneumothorax. IMPRESSION: 1. Interim significant increase in right-sided pleural effusion. Large right pleural effusion is now present. Underlying right lower lung disease cannot be excluded. 2. Stable cardiomegaly . Electronically Signed   By: Maisie Fus  Register   On: 03/02/2016 13:35   Ct Angio Chest Pe W Or Wo Contrast  Result Date: 03/02/2016 CLINICAL DATA:  Sudden onset of dyspnea and fever. Elevated D-dimer. EXAM: CT ANGIOGRAPHY CHEST WITH CONTRAST TECHNIQUE: Multidetector CT imaging of the chest was performed using the standard protocol during bolus administration of intravenous contrast. Multiplanar CT image reconstructions and MIPs were obtained to evaluate the vascular anatomy. CONTRAST:  100 cc Isovue 370 IV COMPARISON:  CXR 03/02/2016, chest CT 11/25/2015  FINDINGS: Cardiovascular: Heart is top normal in size. Diffuse coronary arteriosclerosis. No pericardial effusion. Aortic atherosclerosis without aneurysm or dissection. No large central pulmonary embolus. Mediastinum/Nodes: Stable 8 mm precarinal mediastinal lymph node with smaller upper paratracheal lymph nodes present. No hilar lymphadenopathy. Trachea is patent as are the mainstem bronchi. No definite esophageal abnormality. Lungs/Pleura: Large right effusion with compressive atelectasis sparing portions of the right upper lobe and right lower lobe. Centrilobular emphysema of the aerated left lung with trace left effusion. No pneumonic consolidation or dominant mass. Upper Abdomen: Nodular thickening of the left adrenal gland possibly associated with a benign adenoma. No acute abnormality in the upper abdomen. Musculoskeletal: Mid thoracic degenerative disc disease. No acute osseous abnormality nor bone destruction. Review of the MIP images confirms the above findings. IMPRESSION: Large right effusion with compressive atelectasis sparing portions of the right upper and lower lobes. Centrilobular emphysema of the aerated left lung with trace left effusion. Aortic atherosclerosis with coronary arteriosclerosis. No acute pulmonary embolus. Stable nodular thickening of the left adrenal gland possibly associated with a stable adenoma or possibly mild hyperplasia. Electronically Signed   By: Tollie Eth M.D.   On: 03/02/2016 14:42   Initial Impression / Assessment and Plan / ED Course  I have reviewed the triage vital signs and the nursing notes.  Pertinent labs & imaging results that were available during my care of the patient were reviewed by me and considered in my medical decision making (  see chart for details).  Clinical Course   3:20 PM patient resting comfortably heart rate remains  140 bpm, atrial fibrillation. IV Cardizem ordered 3:50 PM heart rate 10 7 bpm patient resting comfortably, remains  atrial fibrillation. IV Cardizem intravenous drip ordered. Patient is in no respiratory distress and does not require emergent thoracentesis Dr. Ophelia Charter consulted. Plan admit step down unit Intravenous Cardizem drip. Final Clinical Impressions(s) / ED Diagnoses  Diagnosis #1 atrial fibrillation with rapid ventricular response #2 hyperglycemia #3 right-sided pleural effusion . Final diagnoses:  None   CRITICAL CARE Performed by: Doug Sou Total critical care time: 30 minutes Critical care time was exclusive of separately billable procedures and treating other patients. Critical care was necessary to treat or prevent imminent or life-threatening deterioration. Critical care was time spent personally by me on the following activities: development of treatment plan with patient and/or surrogate as well as nursing, discussions with consultants, evaluation of patient's response to treatment, examination of patient, obtaining history from patient or surrogate, ordering and performing treatments and interventions, ordering and review of laboratory studies, ordering and review of radiographic studies, pulse oximetry and re-evaluation of patient's condition. New Prescriptions New Prescriptions   No medications on file     Doug Sou, MD 03/02/16 1625

## 2016-03-02 NOTE — H&P (Signed)
History and Physical    [name] NormaShirley O Hutchins WGN:[id number] DOB: 03/07/1940 DOA: 03/02/2016  PCP: Ignatius Speckinghruv B Vyas, MD Consultants:  Juanetta GoslingHawkins - pulmonology; Diona BrownerMcDowell - cardiology Patient coming from: home - lives with son; [facility], [phone]  Chief Complaint: "I couldn't breathe."  HPI: [name] [name] is a 76 y.o. female with medical history significant of DM, hypothyroidism, HLD, HTN, COPD on home O2, and afib presenting because "I couldn't breathe."  Has had several spells in the last week. Laid down but had to get up today because it was getting worse and worse.  EMS checked O2 sats and it was 90%.  +cough - non productive.  After arrival in the ER, was able to get up 2 good chunks of mucus that were medium color.  Face got clammy and hair got sweaty, subjective fever - lasted 15-20 minutes several times over the last few days.  Denies chest pain.  She knows it is out of rhythm, can tell it is beating fast.  Has had that problem for quite a while.   ED Course: Per Dr. Ethelda ChickJacubowitz:  3:20 PM patient resting comfortably heart rate remains 140 bpm, atrial fibrillation. IV Cardizem ordered  3:50 PM heart rate 10 7 bpm patient resting comfortably, remains atrial fibrillation. IV Cardizem intravenous drip ordered.  Patient is in no respiratory distress and does not require emergent thoracentesis  Dr. Ophelia CharterYates consulted. Plan admit step down unit  Intravenous Cardizem drip.  Final Clinical Impressions(s) / ED Diagnoses  Diagnosis #1 atrial fibrillation with rapid ventricular response  #2 hyperglycemia  #3 right-sided pleural effusion   Review of Systems: As per HPI; otherwise 10 point review of systems reviewed and negative.   Ambulatory Status:  Ambulates with a walker  Past Medical History:  Diagnosis Date  . CAD S/P percutaneous coronary angioplasty 2005, 2012   RCA DES,CFX DES-low risk Nuc June 2016  . Cardiomyopathy (HCC)    EF 65-70% Aug 2016 echo  . Carotid artery disease (HCC)   . Chronic  a-fib (HCC)    Eliquis started July 2016  . COPD (chronic obstructive pulmonary disease) (HCC)    Home O2  . Essential hypertension, benign   . Hyperlipidemia   . Hypothyroidism   . Sinus bradycardia    HR 30-40s with associated lightheadedness/presyncoe. AVN blockers held with improvement.  . Type 2 diabetes mellitus (HCC)     Past Surgical History:  Procedure Laterality Date  . ABDOMINAL HYSTERECTOMY    . APPENDECTOMY    . CHOLECYSTECTOMY    . Excision of Melanoma  04/29/2010   Lower left neck    Social History   Social History  . Marital status: Widowed    Spouse name: N/A  . Number of children: N/A  . Years of education: N/A   Occupational History  . retired    Social History Main Topics  . Smoking status: Former Smoker    Packs/day: 0.50    Years: 45.00    Types: Cigarettes    Start date: 02/29/1964    Quit date: 06/08/2012  . Smokeless tobacco: Never Used  . Alcohol use No  . Drug use: No  . Sexual activity: Not on file   Other Topics Concern  . Not on file   Social History Narrative  . No narrative on file    Allergies  Allergen Reactions  . Adalat [Nifedipine] Nausea And Vomiting  . Claritin-D 12 Hour [Loratadine-Pseudoephedrine Er] Nausea And Vomiting  . Codeine  REACTION: stomach upset  . Plavix [Clopidogrel Bisulfate] Other (See Comments)    Legs hurt  . Wellbutrin [Bupropion] Other (See Comments)    nightmares  . Ciprofloxacin Nausea And Vomiting  . Metformin And Related Other (See Comments)    Fatigue      Family History  Problem Relation Age of Onset  . Colon cancer Mother   . Throat cancer Father   . Heart disease Brother     Prior to Admission medications   Medication Sig Start Date End Date Taking? Authorizing Provider  albuterol (PROVENTIL HFA;VENTOLIN HFA) 108 (90 Base) MCG/ACT inhaler Inhale 1-2 puffs into the lungs every 6 (six) hours as needed for wheezing or shortness of breath.   Yes Historical Provider, MD  albuterol  (PROVENTIL) (2.5 MG/3ML) 0.083% nebulizer solution Take 2.5 mg by nebulization every 6 (six) hours as needed for wheezing or shortness of breath.   Yes Historical Provider, MD  ALPRAZolam Prudy Feeler) 0.5 MG tablet Take 0.5 mg by mouth 3 (three) times daily as needed for anxiety.   Yes Historical Provider, MD  dexlansoprazole (DEXILANT) 60 MG capsule Take 60 mg by mouth daily.     Yes Historical Provider, MD  diltiazem (TIAZAC) 180 MG 24 hr capsule Take 180 mg by mouth 2 (two) times daily.   Yes Historical Provider, MD  ELIQUIS 5 MG TABS tablet TAKE 1 TABLET BY MOUTH TWICE DAILY 09/30/14  Yes Rollene Rotunda, MD  furosemide (LASIX) 40 MG tablet Take 1 tablet (40 mg total) by mouth daily. 11/28/15  Yes Houston Siren, MD  glipiZIDE (GLUCOTROL) 10 MG tablet Take 1 tablet (10 mg total) by mouth 2 (two) times daily before a meal. Do not take glipizide if blood sugar falls below 125. 12/11/15  Yes Elliot Cousin, MD  HYDROcodone-acetaminophen (NORCO/VICODIN) 5-325 MG tablet Take 1 tablet by mouth 2 (two) times daily as needed for moderate pain.   Yes Historical Provider, MD  insulin glargine (LANTUS) 100 UNIT/ML injection Inject 0.15 mLs (15 Units total) into the skin 2 (two) times daily. 12/11/15  Yes Elliot Cousin, MD  levalbuterol Pauline Aus) 0.63 MG/3ML nebulizer solution Take 3 mLs (0.63 mg total) by nebulization every 3 (three) hours as needed for wheezing or shortness of breath. 09/11/15  Yes Drema Dallas, MD  levofloxacin (LEVAQUIN) 500 MG tablet Take 500 mg by mouth daily.   Yes Historical Provider, MD  levothyroxine (SYNTHROID, LEVOTHROID) 100 MCG tablet Take 100 mcg by mouth daily before breakfast.   Yes Historical Provider, MD  meclizine (ANTIVERT) 25 MG tablet Take 12.5 mg by mouth daily as needed for dizziness.   Yes Historical Provider, MD  metoprolol tartrate (LOPRESSOR) 25 MG tablet Take 25 mg by mouth daily.    Yes Historical Provider, MD  nitroGLYCERIN (NITROSTAT) 0.4 MG SL tablet Place 1 tablet (0.4 mg  total) under the tongue every 5 (five) minutes x 3 doses as needed for chest pain. For severe chest pain 08/07/14  Yes Jonelle Sidle, MD  potassium chloride SA (K-DUR,KLOR-CON) 20 MEQ tablet Take 20 mEq by mouth daily.   Yes Historical Provider, MD  rosuvastatin (CRESTOR) 10 MG tablet Take 1 tablet (10 mg total) by mouth daily. Patient taking differently: Take 10 mg by mouth at bedtime.  10/05/10  Yes June Leap, MD  spironolactone (ALDACTONE) 25 MG tablet Take 0.5 tablets (12.5 mg total) by mouth daily. 01/16/16  Yes Leroy Sea, MD  Vitamin D, Ergocalciferol, (DRISDOL) 50000 units CAPS capsule Take 50,000 Units  by mouth every 7 (seven) days. Takes on Mondays.   Yes Historical Provider, MD  diltiazem (CARDIZEM CD) 360 MG 24 hr capsule Take 1 capsule (360 mg total) by mouth daily. Patient not taking: Reported on 03/02/2016 09/11/15   Drema Dallas, MD  Insulin Syringes, Disposable, U-100 1 ML MISC 15 Units by Does not apply route 2 (two) times daily. 12/11/15   Elliot Cousin, MD  OXYGEN Inhale 2-3 L into the lungs continuous.    Historical Provider, MD  predniSONE (DELTASONE) 5 MG tablet Label  & dispense according to the schedule below. 10 Pills PO for 3 days then, 8 Pills PO for 3 days, 6 Pills PO for 3 days, 4 Pills PO for 3 days, 2 Pills PO for 3 days, 1 Pills PO for 3 days, 1/2 Pill  PO for 3 days then STOP. Total 95 pills. Patient not taking: Reported on 03/02/2016 01/15/16   Leroy Sea, MD    Physical Exam: Vitals:   03/02/16 1803 03/02/16 1858 03/02/16 2100 03/02/16 2123  BP: 150/79  (!) 136/92   Pulse: 119 (!) 121 99   Resp: (!) 39 (!) 32 (!) 28   Temp: 97.6 F (36.4 C)     TempSrc: Oral     SpO2: (!) 88% 91% 92% 90%  Weight:  71.5 kg (157 lb 10.1 oz)    Height:  5\' 3"  (1.6 m)       General:  Appears to be in mild respiratory distress Eyes:  PERRL, EOMI, normal lids, iris ENT:  grossly normal hearing, lips & tongue, mmm Neck:  no LAD, masses or  thyromegaly Cardiovascular:  Irregularly irregular with tachycardia to 110s, no m/r/g. Respiratory: Diffuse expiratory wheezes bilaterally. Tachypnea, mild increased WOB. Abdomen: soft, ntnd, NABS Skin:  no rash or induration seen on limited exam Musculoskeletal:  grossly normal tone BUE/BLE, good ROM, no bony abnormality.  +RLE edema, right calf measures 34 cm vs. 30 on the left.  No erythema, negative Homan's, negative squeeze, no warmth. Psychiatric:  grossly normal mood and affect, speech fluent and appropriate, AOx3 Neurologic:  CN 2-12 grossly intact, moves all extremities in coordinated fashion, sensation intact  Labs on Admission: I have personally reviewed following labs and imaging studies  CBC:  Recent Labs Lab 03/02/16 1251  WBC 15.9*  HGB 11.9*  HCT 38.8  MCV 95.6  PLT 371   Basic Metabolic Panel:  Recent Labs Lab 03/02/16 1251  NA 132*  K 3.6  CL 96*  CO2 28  GLUCOSE 263*  BUN 25*  CREATININE 1.05*  CALCIUM 8.6*   GFR: Estimated Creatinine Clearance: 44.5 mL/min (by C-G formula based on SCr of 1.05 mg/dL (H)). Liver Function Tests: No results for input(s): AST, ALT, ALKPHOS, BILITOT, PROT, ALBUMIN in the last 168 hours. No results for input(s): LIPASE, AMYLASE in the last 168 hours. No results for input(s): AMMONIA in the last 168 hours. Coagulation Profile: No results for input(s): INR, PROTIME in the last 168 hours. Cardiac Enzymes:  Recent Labs Lab 03/02/16 1251 03/02/16 1541 03/02/16 1907  TROPONINI 0.04* 0.04* 0.03*   BNP (last 3 results) No results for input(s): PROBNP in the last 8760 hours. HbA1C: No results for input(s): HGBA1C in the last 72 hours. CBG:  Recent Labs Lab 03/02/16 2047  GLUCAP 344*   Lipid Profile: No results for input(s): CHOL, HDL, LDLCALC, TRIG, CHOLHDL, LDLDIRECT in the last 72 hours. Thyroid Function Tests: No results for input(s): TSH, T4TOTAL, FREET4, T3FREE, THYROIDAB in  the last 72 hours. Anemia  Panel: No results for input(s): VITAMINB12, FOLATE, FERRITIN, TIBC, IRON, RETICCTPCT in the last 72 hours. Urine analysis:    Component Value Date/Time   COLORURINE YELLOW 01/05/2016 0611   APPEARANCEUR CLEAR 01/05/2016 0611   LABSPEC 1.025 01/05/2016 0611   PHURINE 6.0 01/05/2016 0611   GLUCOSEU 500 (A) 01/05/2016 0611   HGBUR SMALL (A) 01/05/2016 0611   BILIRUBINUR NEGATIVE 01/05/2016 0611   KETONESUR NEGATIVE 01/05/2016 0611   PROTEINUR 100 (A) 01/05/2016 0611   NITRITE NEGATIVE 01/05/2016 0611   LEUKOCYTESUR NEGATIVE 01/05/2016 0611    Creatinine Clearance: Estimated Creatinine Clearance: 44.5 mL/min (by C-G formula based on SCr of 1.05 mg/dL (H)).  Sepsis Labs: @LABRCNTIP (procalcitonin:4,lacticidven:4) ) Recent Results (from the past 240 hour(s))  MRSA PCR Screening     Status: None   Collection Time: 03/02/16  6:24 PM  Result Value Ref Range Status   MRSA by PCR NEGATIVE NEGATIVE Final    Comment:        The GeneXpert MRSA Assay (FDA approved for NASAL specimens only), is one component of a comprehensive MRSA colonization surveillance program. It is not intended to diagnose MRSA infection nor to guide or monitor treatment for MRSA infections.      Radiological Exams on Admission: Dg Chest 2 View  Result Date: 03/02/2016 CLINICAL DATA:  COPD.  Worsening shortness of breath. EXAM: CHEST  2 VIEW COMPARISON:  01/09/2016. FINDINGS: Mediastinum hilar structures are unremarkable. Persistent cardiomegaly. Interval significant increase in right-sided pleural effusion. Large pleural effusion is now present. Underlying right lower lung disease cannot be excluded. No pneumothorax. IMPRESSION: 1. Interim significant increase in right-sided pleural effusion. Large right pleural effusion is now present. Underlying right lower lung disease cannot be excluded. 2. Stable cardiomegaly . Electronically Signed   By: Maisie Fus  Register   On: 03/02/2016 13:35   Ct Angio Chest Pe W Or Wo  Contrast  Result Date: 03/02/2016 CLINICAL DATA:  Sudden onset of dyspnea and fever. Elevated D-dimer. EXAM: CT ANGIOGRAPHY CHEST WITH CONTRAST TECHNIQUE: Multidetector CT imaging of the chest was performed using the standard protocol during bolus administration of intravenous contrast. Multiplanar CT image reconstructions and MIPs were obtained to evaluate the vascular anatomy. CONTRAST:  100 cc Isovue 370 IV COMPARISON:  CXR 03/02/2016, chest CT 11/25/2015 FINDINGS: Cardiovascular: Heart is top normal in size. Diffuse coronary arteriosclerosis. No pericardial effusion. Aortic atherosclerosis without aneurysm or dissection. No large central pulmonary embolus. Mediastinum/Nodes: Stable 8 mm precarinal mediastinal lymph node with smaller upper paratracheal lymph nodes present. No hilar lymphadenopathy. Trachea is patent as are the mainstem bronchi. No definite esophageal abnormality. Lungs/Pleura: Large right effusion with compressive atelectasis sparing portions of the right upper lobe and right lower lobe. Centrilobular emphysema of the aerated left lung with trace left effusion. No pneumonic consolidation or dominant mass. Upper Abdomen: Nodular thickening of the left adrenal gland possibly associated with a benign adenoma. No acute abnormality in the upper abdomen. Musculoskeletal: Mid thoracic degenerative disc disease. No acute osseous abnormality nor bone destruction. Review of the MIP images confirms the above findings. IMPRESSION: Large right effusion with compressive atelectasis sparing portions of the right upper and lower lobes. Centrilobular emphysema of the aerated left lung with trace left effusion. Aortic atherosclerosis with coronary arteriosclerosis. No acute pulmonary embolus. Stable nodular thickening of the left adrenal gland possibly associated with a stable adenoma or possibly mild hyperplasia. Electronically Signed   By: Tollie Eth M.D.   On: 03/02/2016 14:42  EKG: Independently  reviewed.  Afib with rate 122; nonspecific ST changes with no evidence of acute ischemia  Assessment/Plan Principal Problem:   Atrial fibrillation with rapid ventricular response (HCC) Active Problems:   Chronic anticoagulation-CHADs VASc=6   Acute on chronic respiratory failure with hypoxia (HCC)   Uncontrolled type 2 diabetes mellitus with complication (HCC)   COPD (chronic obstructive pulmonary disease) (HCC)   Pressure injury of skin   Pleural effusion on right   Edema of right lower extremity   Afib with RVR -Patient presenting with recurrent afib.  -Etiology is thought to be related to respiratory failure/COPD/pleural effusion.  -Will admit to SDU for Diltiazem drip as per protocol with plan to transition to PO Diltiazem once heart rate is controlled.   -Will cycle cardiac enzymes and provide daily ASA 81 mg PO daily.   -Mild troponin elevation is likely demand ischemia. -Normal TSH in 9/17 so will not repeat.   - Will repeat EKG in AM and plan to consult cardiology at that time if needed. -CHA2DS2-VASc Score is >2 and so patient is on oral anticoagulation. Patient is on  Eliquis at home. Will continue.   Pleural effusion -While she is having respiratory compromise, she is not in acute distress and so does not require emergent thoracentesis. -If she decompensates overnight, she will either need thoracentesis here or will need transfer to [facility]. -In the meantime, will place IR consult for drainage tomorrow. -If her afib with RVR remains uncontrolled, she may need further delay in drainage of the effusion.  COPD -Recent diagnosis of HCAP/COPD exacerbation. -She has recently been treated again with Levaquin and completes a 7-day course tomorrow. -She currently denies cardinal symptoms which would indicate need for further antibiotics.   -If the pleural effusion appears to be parapneumonic in nature, then she may need additional antibiotics. -Will treat for now with steroids and  nebulizer treatments as well as supplemental O2.  RLE edema -Her leg is clearly larger than the left -She reports that the change and either may swell intermittently or they more both swell -She is already effectively anticoagulated -For now, will not image - but if any additional concerns for DVT exist then she will need an ultrasound  DM -Glucose 344 -A1c was 8.9 in 10/17, indicating suboptimal control -Continue Lantus, hold oral meds, and cover with SSI   DVT prophylaxis: Eliquis Code Status: Full - confirmed with patient/family Family Communication: Son-in-law present throughout evaluation  Disposition Plan:  Home once clinically improved Consults called: Pulmonology; Nutrition; RT; CM Admission status: Admit - It is my clinical opinion that admission to INPATIENT is reasonable and necessary because this patient will require at least 2 midnights in the hospital to treat this condition based on the medical complexity of the problems presented.  Given the aforementioned information, the predictability of an adverse outcome is felt to be significant.    Jonah Blue MD [facility]  If 7PM-7AM, please contact night-coverage [email or web address] Password [email or web address]  03/02/2016, 10:58 PM

## 2016-03-03 ENCOUNTER — Inpatient Hospital Stay (HOSPITAL_COMMUNITY): Payer: Medicare Other

## 2016-03-03 DIAGNOSIS — E1165 Type 2 diabetes mellitus with hyperglycemia: Secondary | ICD-10-CM

## 2016-03-03 DIAGNOSIS — J42 Unspecified chronic bronchitis: Secondary | ICD-10-CM

## 2016-03-03 DIAGNOSIS — J9621 Acute and chronic respiratory failure with hypoxia: Secondary | ICD-10-CM

## 2016-03-03 DIAGNOSIS — Z7901 Long term (current) use of anticoagulants: Secondary | ICD-10-CM

## 2016-03-03 DIAGNOSIS — E118 Type 2 diabetes mellitus with unspecified complications: Secondary | ICD-10-CM

## 2016-03-03 LAB — GRAM STAIN: GRAM STAIN: NONE SEEN

## 2016-03-03 LAB — BASIC METABOLIC PANEL
Anion gap: 7 (ref 5–15)
BUN: 21 mg/dL — AB (ref 6–20)
CHLORIDE: 95 mmol/L — AB (ref 101–111)
CO2: 31 mmol/L (ref 22–32)
CREATININE: 0.87 mg/dL (ref 0.44–1.00)
Calcium: 8.8 mg/dL — ABNORMAL LOW (ref 8.9–10.3)
Glucose, Bld: 346 mg/dL — ABNORMAL HIGH (ref 65–99)
POTASSIUM: 4.2 mmol/L (ref 3.5–5.1)
SODIUM: 133 mmol/L — AB (ref 135–145)

## 2016-03-03 LAB — BODY FLUID CELL COUNT WITH DIFFERENTIAL
Eos, Fluid: 0 %
LYMPHS FL: 55 %
Monocyte-Macrophage-Serous Fluid: 11 % — ABNORMAL LOW (ref 50–90)
NEUTROPHIL FLUID: 34 % — AB (ref 0–25)
WBC FLUID: 109 uL (ref 0–1000)

## 2016-03-03 LAB — CBC
HCT: 36.6 % (ref 36.0–46.0)
Hemoglobin: 11.3 g/dL — ABNORMAL LOW (ref 12.0–15.0)
MCH: 29.6 pg (ref 26.0–34.0)
MCHC: 30.9 g/dL (ref 30.0–36.0)
MCV: 95.8 fL (ref 78.0–100.0)
PLATELETS: 247 10*3/uL (ref 150–400)
RBC: 3.82 MIL/uL — AB (ref 3.87–5.11)
RDW: 18.3 % — AB (ref 11.5–15.5)
WBC: 9.4 10*3/uL (ref 4.0–10.5)

## 2016-03-03 LAB — GLUCOSE, SEROUS FLUID: GLUCOSE FL: 332 mg/dL

## 2016-03-03 LAB — GLUCOSE, CAPILLARY
GLUCOSE-CAPILLARY: 250 mg/dL — AB (ref 65–99)
GLUCOSE-CAPILLARY: 282 mg/dL — AB (ref 65–99)
Glucose-Capillary: 320 mg/dL — ABNORMAL HIGH (ref 65–99)
Glucose-Capillary: 273 mg/dL — ABNORMAL HIGH (ref 65–99)

## 2016-03-03 LAB — LACTATE DEHYDROGENASE, PLEURAL OR PERITONEAL FLUID: LD, Fluid: 56 U/L — ABNORMAL HIGH (ref 3–23)

## 2016-03-03 LAB — TROPONIN I
TROPONIN I: 0.03 ng/mL — AB (ref <0.03)
Troponin I: 0.04 ng/mL (ref <0.03)

## 2016-03-03 LAB — PROTEIN, BODY FLUID

## 2016-03-03 MED ORDER — ORAL CARE MOUTH RINSE
15.0000 mL | Freq: Two times a day (BID) | OROMUCOSAL | Status: DC
  Administered 2016-03-03 – 2016-03-08 (×7): 15 mL via OROMUCOSAL

## 2016-03-03 MED ORDER — GLUCERNA SHAKE PO LIQD
237.0000 mL | Freq: Three times a day (TID) | ORAL | Status: DC
  Administered 2016-03-03 – 2016-03-08 (×11): 237 mL via ORAL

## 2016-03-03 MED ORDER — INSULIN ASPART 100 UNIT/ML ~~LOC~~ SOLN
0.0000 [IU] | Freq: Three times a day (TID) | SUBCUTANEOUS | Status: DC
  Administered 2016-03-03: 7 [IU] via SUBCUTANEOUS
  Administered 2016-03-03: 11 [IU] via SUBCUTANEOUS
  Administered 2016-03-04: 15 [IU] via SUBCUTANEOUS
  Administered 2016-03-04 (×2): 20 [IU] via SUBCUTANEOUS
  Administered 2016-03-05: 15 [IU] via SUBCUTANEOUS
  Administered 2016-03-05 (×2): 7 [IU] via SUBCUTANEOUS
  Administered 2016-03-06 (×2): 11 [IU] via SUBCUTANEOUS
  Administered 2016-03-06 – 2016-03-07 (×2): 15 [IU] via SUBCUTANEOUS
  Administered 2016-03-07: 4 [IU] via SUBCUTANEOUS
  Administered 2016-03-08: 7 [IU] via SUBCUTANEOUS
  Administered 2016-03-08: 4 [IU] via SUBCUTANEOUS

## 2016-03-03 MED ORDER — INSULIN GLARGINE 100 UNIT/ML ~~LOC~~ SOLN
20.0000 [IU] | Freq: Two times a day (BID) | SUBCUTANEOUS | Status: DC
  Administered 2016-03-03 – 2016-03-04 (×3): 20 [IU] via SUBCUTANEOUS
  Filled 2016-03-03 (×9): qty 0.2

## 2016-03-03 MED ORDER — INSULIN ASPART 100 UNIT/ML ~~LOC~~ SOLN
3.0000 [IU] | Freq: Three times a day (TID) | SUBCUTANEOUS | Status: DC
  Administered 2016-03-03 – 2016-03-04 (×3): 3 [IU] via SUBCUTANEOUS

## 2016-03-03 NOTE — Progress Notes (Signed)
PROGRESS NOTE    [name] NormaShirley O Goyne  JYN:[id number] DOB: 12/24/1940 DOA: 03/02/2016 PCP: Ignatius Speckinghruv B Vyas, MD    Brief Narrative: [name] [name] is a 76 y.o. female with medical history significant of DM, hypothyroidism, HLD, HTN, COPD on home O2, and afib presents with difficulty  Breathing, and non productive cough, associated with afib with RVR.  She was admitted to step down, started on cardizem gtt and pulmonary consulted.   Assessment & Plan:   Principal Problem:   Atrial fibrillation with rapid ventricular response (HCC) Active Problems:   Chronic anticoagulation-CHADs VASc=6   Acute on chronic respiratory failure with hypoxia (HCC)   Uncontrolled type 2 diabetes mellitus with complication (HCC)   COPD (chronic obstructive pulmonary disease) (HCC)   Pressure injury of skin   Pleural effusion on right   Edema of right lower extremity   Acute on chronic respiratory failure with hypoxia; secondary to COPD exacerbation and right large pleural effusion.  Admitted to step down, started on IV steroids, duonebs, levaquin.  IR consulted for thoracentesis and the pleural fluid  Will be sent for analysis.     Atrial fibrillation with RVR:  On Cardizem gtt for rate control. Resume po lopressor.  Resume eliquis for anti coagulation.  Probably brought on by increased work of breathing and duonebs.    Uncontrolled DM: hgba1c ordered.  SSI .  CBG (last 3)   Recent Labs  03/02/16 2047 03/03/16 0715  GLUCAP 344* 320*    Increased lantus to 20 units BID, added novolog 3 units tidac, and increased SSI to resistant scale.     Chronic diastolic heart failure/ CAD S/P PCI:  Resume lasix, spironolactone, aspirin and statin.   Hypothyroidism: resume synthroid.   DVT prophylaxis: Eliquis.  Code Status: full code.  Family Communication: none at bedside.  Disposition Plan: pending further eval.    Consultants:   Pulmonary consult.    Procedures: thoracentesis.     Antimicrobials: levaquin   Subjective: Breathing is the same.   Objective: Vitals:   03/03/16 0700 03/03/16 0717 03/03/16 0800 03/03/16 0945  BP: 114/82  (!) 124/58 117/62  Pulse: 94 (!) 109 95 100  Resp: 18 (!) 28 (!) 28   Temp:  97.9 F (36.6 C)    TempSrc:  Oral    SpO2: 96% 96% 93%   Weight:      Height:        Intake/Output Summary (Last 24 hours) at 03/03/16 0946 Last data filed at 03/03/16 0831  Gross per 24 hour  Intake           199.33 ml  Output              800 ml  Net          -600.67 ml   Filed Weights   03/02/16 1243 03/02/16 1858 03/03/16 0449  Weight: 71.2 kg (157 lb) 71.5 kg (157 lb 10.1 oz) 71.8 kg (158 lb 4.6 oz)    Examination:  General exam: Appears exhausted.  Respiratory system: diminished air entry on the right when compared to left Cardiovascular system: S1 & S2 heard, irregular. No JVD, murmurs, rubs, gallops or clicks. No pedal edema. Gastrointestinal system: Abdomen is nondistended, soft and nontender. No organomegaly or masses felt. Normal bowel sounds heard. Central nervous system: Alert and oriented. No focal neurological deficits. Extremities: Symmetric 5 x 5 power. Skin: No rashes, lesions or ulcers Psychiatry: Judgement and insight appear normal. Mood & affect appropriate.  Data Reviewed: I have personally reviewed following labs and imaging studies  CBC:  Recent Labs Lab 03/02/16 1251 03/03/16 0629  WBC 15.9* 9.4  HGB 11.9* 11.3*  HCT 38.8 36.6  MCV 95.6 95.8  PLT 371 247   Basic Metabolic Panel:  Recent Labs Lab 03/02/16 1251 03/03/16 0629  NA 132* 133*  K 3.6 4.2  CL 96* 95*  CO2 28 31  GLUCOSE 263* 346*  BUN 25* 21*  CREATININE 1.05* 0.87  CALCIUM 8.6* 8.8*   GFR: Estimated Creatinine Clearance: 53.9 mL/min (by C-G formula based on SCr of 0.87 mg/dL). Liver Function Tests: No results for input(s): AST, ALT, ALKPHOS, BILITOT, PROT, ALBUMIN in the last 168 hours. No results for input(s): LIPASE,  AMYLASE in the last 168 hours. No results for input(s): AMMONIA in the last 168 hours. Coagulation Profile: No results for input(s): INR, PROTIME in the last 168 hours. Cardiac Enzymes:  Recent Labs Lab 03/02/16 1251 03/02/16 1541 03/02/16 1907 03/03/16 0055 03/03/16 0634  TROPONINI 0.04* 0.04* 0.03* 0.04* 0.03*   BNP (last 3 results) No results for input(s): PROBNP in the last 8760 hours. HbA1C: No results for input(s): HGBA1C in the last 72 hours. CBG:  Recent Labs Lab 03/02/16 2047 03/03/16 0715  GLUCAP 344* 320*   Lipid Profile: No results for input(s): CHOL, HDL, LDLCALC, TRIG, CHOLHDL, LDLDIRECT in the last 72 hours. Thyroid Function Tests: No results for input(s): TSH, T4TOTAL, FREET4, T3FREE, THYROIDAB in the last 72 hours. Anemia Panel: No results for input(s): VITAMINB12, FOLATE, FERRITIN, TIBC, IRON, RETICCTPCT in the last 72 hours. Sepsis Labs:  Recent Labs Lab 03/02/16 1251 03/02/16 1541  LATICACIDVEN 1.5 1.4    Recent Results (from the past 240 hour(s))  MRSA PCR Screening     Status: None   Collection Time: 03/02/16  6:24 PM  Result Value Ref Range Status   MRSA by PCR NEGATIVE NEGATIVE Final    Comment:        The GeneXpert MRSA Assay (FDA approved for NASAL specimens only), is one component of a comprehensive MRSA colonization surveillance program. It is not intended to diagnose MRSA infection nor to guide or monitor treatment for MRSA infections.          Radiology Studies: Dg Chest 2 View  Result Date: 03/02/2016 CLINICAL DATA:  COPD.  Worsening shortness of breath. EXAM: CHEST  2 VIEW COMPARISON:  01/09/2016. FINDINGS: Mediastinum hilar structures are unremarkable. Persistent cardiomegaly. Interval significant increase in right-sided pleural effusion. Large pleural effusion is now present. Underlying right lower lung disease cannot be excluded. No pneumothorax. IMPRESSION: 1. Interim significant increase in right-sided pleural  effusion. Large right pleural effusion is now present. Underlying right lower lung disease cannot be excluded. 2. Stable cardiomegaly . Electronically Signed   By: Maisie Fus  Register   On: 03/02/2016 13:35   Ct Angio Chest Pe W Or Wo Contrast  Result Date: 03/02/2016 CLINICAL DATA:  Sudden onset of dyspnea and fever. Elevated D-dimer. EXAM: CT ANGIOGRAPHY CHEST WITH CONTRAST TECHNIQUE: Multidetector CT imaging of the chest was performed using the standard protocol during bolus administration of intravenous contrast. Multiplanar CT image reconstructions and MIPs were obtained to evaluate the vascular anatomy. CONTRAST:  100 cc Isovue 370 IV COMPARISON:  CXR 03/02/2016, chest CT 11/25/2015 FINDINGS: Cardiovascular: Heart is top normal in size. Diffuse coronary arteriosclerosis. No pericardial effusion. Aortic atherosclerosis without aneurysm or dissection. No large central pulmonary embolus. Mediastinum/Nodes: Stable 8 mm precarinal mediastinal lymph node with smaller upper paratracheal lymph  nodes present. No hilar lymphadenopathy. Trachea is patent as are the mainstem bronchi. No definite esophageal abnormality. Lungs/Pleura: Large right effusion with compressive atelectasis sparing portions of the right upper lobe and right lower lobe. Centrilobular emphysema of the aerated left lung with trace left effusion. No pneumonic consolidation or dominant mass. Upper Abdomen: Nodular thickening of the left adrenal gland possibly associated with a benign adenoma. No acute abnormality in the upper abdomen. Musculoskeletal: Mid thoracic degenerative disc disease. No acute osseous abnormality nor bone destruction. Review of the MIP images confirms the above findings. IMPRESSION: Large right effusion with compressive atelectasis sparing portions of the right upper and lower lobes. Centrilobular emphysema of the aerated left lung with trace left effusion. Aortic atherosclerosis with coronary arteriosclerosis. No acute pulmonary  embolus. Stable nodular thickening of the left adrenal gland possibly associated with a stable adenoma or possibly mild hyperplasia. Electronically Signed   By: Tollie Eth M.D.   On: 03/02/2016 14:42        Scheduled Meds: . apixaban  5 mg Oral BID  . aspirin  81 mg Oral Daily  . furosemide  40 mg Oral Daily  . insulin aspart  0-20 Units Subcutaneous TID WC  . insulin aspart  0-5 Units Subcutaneous QHS  . insulin aspart  3 Units Subcutaneous TID WC  . insulin glargine  20 Units Subcutaneous BID  . ipratropium-albuterol  3 mL Nebulization Q6H  . levofloxacin  500 mg Oral Daily  . levothyroxine  100 mcg Oral QAC breakfast  . mouth rinse  15 mL Mouth Rinse BID  . methylPREDNISolone (SOLU-MEDROL) injection  60 mg Intravenous Q12H  . metoprolol tartrate  25 mg Oral Daily  . pantoprazole  80 mg Oral Daily  . potassium chloride SA  20 mEq Oral Daily  . rosuvastatin  10 mg Oral QHS  . spironolactone  12.5 mg Oral Daily   Continuous Infusions: . diltiazem (CARDIZEM) infusion 15 mg/hr (03/03/16 0327)     LOS: 1 day    Time spent: 35 minutes.     Kathlen Mody, MD [facility] Pager [phone]   If 7PM-7AM, please contact night-coverage [email or web address] Password [facility] 03/03/2016, 9:46 AM

## 2016-03-03 NOTE — Progress Notes (Signed)
Initial Nutrition Assessment  INTERVENTION:  Glucerna Shake po BID, each supplement provides 220 kcal and 10 grams of protein   Provide diabetic diet review if needed based on her A1C results  NUTRITION DIAGNOSIS:   Increased protein needs; chronic.  GOAL:  Pt to meet >/= 90% of their estimated nutrition needs      MONITOR:  Po intake, labs and wt trends     REASON FOR ASSESSMENT:   Consult Assessment of nutrition requirement/status  ASSESSMENT:  Ms Yetta BarreJones presents short of breath. Her hx includes DM2, HLD, COPD, and HTN.   Patient says that son helps her with meals and shopping for food. She affirms a  good appetite and reports she ate 75% of her meal today but that is not what is reflected in the meal documentation.  Her weight hx is stable in the 155- 158# for most of the last year.  US thoracentesis today- right PE: 1.5 liters removed.  Recent Labs Lab 03/02/16 1251 03/03/16 0629  NA 132* 133*  K 3.6 4.2  CL 96* 95*  CO2 28 31  BUN 25* 21*  CREATININE 1.05* 0.87  CALCIUM 8.6* 8.8*  GLUCOSE 263* 346*   Labs: sodium 133, Glucose 346  Meds: lasix, insulin, PPI, Potassium  NFPE- WDL  Diet Order:  Diet heart healthy/carb modified Room service appropriate? Yes; Fluid consistency: Thin  Skin:   stage I   Last BM:  prior to admission  Height:   Ht Readings from Last 1 Encounters:  03/02/16 5\' 3"  (1.6 m)    Weight:   Wt Readings from Last 1 Encounters:  03/03/16 158 lb 4.6 oz (71.8 kg)    Ideal Body Weight:  52.2 kg  BMI:  Body mass index is 28.04 kg/m.  Estimated Nutritional Needs:   Kcal:  [id number]  Protein:  85-90 gr  Fluid:  >1.5 liters daily  EDUCATION NEEDS: none at this time. Will follow up results of A1C- she may need review of diabetic diet    Royann ShiversLynn Veryl Winemiller MS,RD,CSG,LDN Office: [phone] Pager: [phone]

## 2016-03-03 NOTE — Progress Notes (Signed)
Inpatient Diabetes Program Recommendations  AACE/ADA: New Consensus Statement on Inpatient Glycemic Control (2015)  Target Ranges:  Prepandial:   less than 140 mg/dL      Peak postprandial:   less than 180 mg/dL (1-2 hours)      Critically ill patients:  140 - 180 mg/dL  Results for [name] [name], [name] [name] (MRN [id number]) as of 03/03/2016 08:08  Ref. Range 03/02/2016 20:47 03/03/2016 07:15  Glucose-Capillary Latest Ref Range: 65 - 99 mg/dL 409344 (H) 811320 (H)    Review of Glycemic Control  Diabetes history: DM2 Outpatient Diabetes medications: Lantus 15 units BID, Glipizide 10 mg BID Current orders for Inpatient glycemic control: Lantus 15 units BID, Novolog 0-15 units TID with meals, Novolog 0-5 units QHS  Inpatient Diabetes Program Recommendations: Insulin - Basal: Please consider increasing Lantus to 20 units BID. Correction (SSI): Please consider changing frequency to Q4H if patient continues to be NPO. Insulin - Meal Coverage: Once diet is resumed, if steroids are continued please consider ordering Novolog 4 units TID with meals for meal coverage.  Thanks, Orlando PennerMarie Heddy Vidana, RN, MSN, CDE Diabetes Coordinator Inpatient Diabetes Program [phone] (Team Pager from 8am to 5pm)

## 2016-03-03 NOTE — Procedures (Signed)
PreOperative Dx: RT pleural effusion Postoperative Dx: RT pleural effusion Procedure:   US guided RT thoracentesis Radiologist:  Tyron RussellBoles Anesthesia:  10 ml of 1% lidocaine Specimen:  1.5 L of clear yellow colored fluid EBL:   < 1 ml Complications: None

## 2016-03-03 NOTE — Consult Note (Signed)
Consult requested by: [facility] Consult requested for: Respiratory failure/pleural effusion  HPI: This is a 76 year old who had been in her usual state of health at home when she started noticing increased heart rate and that her heart was irregular. She became more short of breath. She did not have any fever. She did however bring up some mucus she also had PND and orthopnea. She said she felt like she had some fever and she did get somewhat diaphoretic. When she came to the emergency room she was noted to have large pleural effusion on the right. I personally reviewed the chest x-ray she has a history of COPD and chronic A. fib. She has other problems including what I believe is diastolic heart failure. She has been on chronic oxygen at home. She had previous right pleural effusion that was getting much better and at that time seemed to be parapneumonic. She has been on antibiotics from her primary care provider that she's supposed to finish today. She has been chronically anticoagulated. She has not had any hemoptysis. She has brought up some yellowish sputum. She has a bruise on her right breast. No nausea vomiting or diarrhea. No abdominal pain. No chest pain.  Past Medical History:  Diagnosis Date  . CAD S/P percutaneous coronary angioplasty 2005, 2012   RCA DES,CFX DES-low risk Nuc June 2016  . Cardiomyopathy (HCC)    EF 65-70% Aug 2016 echo  . Carotid artery disease (HCC)   . Chronic a-fib (HCC)    Eliquis started July 2016  . COPD (chronic obstructive pulmonary disease) (HCC)    Home O2  . Essential hypertension, benign   . Hyperlipidemia   . Hypothyroidism   . Sinus bradycardia    HR 30-40s with associated lightheadedness/presyncoe. AVN blockers held with improvement.  . Type 2 diabetes mellitus (HCC)      Family History  Problem Relation Age of Onset  . Colon cancer Mother   . Throat cancer Father   . Heart disease Brother      Social History   Social History  .  Marital status: Widowed    Spouse name: N/A  . Number of children: N/A  . Years of education: N/A   Occupational History  . retired    Social History Main Topics  . Smoking status: Former Smoker    Packs/day: 0.50    Years: 45.00    Types: Cigarettes    Start date: 02/29/1964    Quit date: 06/08/2012  . Smokeless tobacco: Never Used  . Alcohol use No  . Drug use: No  . Sexual activity: Not Asked   Other Topics Concern  . None   Social History Narrative  . None     ROS: Except as mentioned 10 point review of systems is negative    Objective: Vital signs in last 24 hours: Temp:  [97.6 F (36.4 C)-98.9 F (37.2 C)] 97.9 F (36.6 C) (01/04 0717) Pulse Rate:  [36-123] 109 (01/04 0717) Resp:  [18-39] 28 (01/04 0717) BP: (86-161)/(47-106) 86/51 (01/04 0600) SpO2:  [82 %-97 %] 96 % (01/04 0717) Weight:  [71.2 kg (157 lb)-71.8 kg (158 lb 4.6 oz)] 71.8 kg (158 lb 4.6 oz) (01/04 0449) Weight change:     Intake/Output from previous day: 01/03 0701 - 01/04 0700 In: 199.3 [I.V.:199.3] Out: 800 [Urine:800]  PHYSICAL EXAM Constitutional: She is awake and alert. Her heart rate is still elevated and she appears in mild distress. Eyes: Pupils react. EOMI. Ears nose mouth and  throat: Her mucous membranes are moist. Hearing is grossly normal. Cardiovascular: She is in atrial fib with RVR at a rate of about 1:15. I don't hear a gallop. Respiratory: Her respiratory effort is slightly increased. She has diminished breath sounds on the right. Gastrointestinal: Her abdomen is soft without masses. Skin: She has a bruise on her right breast which she says is getting somewhat better. Musculoskeletal: Strength grossly normal neurological: No focal abnormalities psychiatric: She admits to being somewhat anxious  Lab Results: Basic Metabolic Panel:  Recent Labs  16/10/96 1251 03/03/16 0629  NA 132* 133*  K 3.6 4.2  CL 96* 95*  CO2 28 31  GLUCOSE 263* 346*  BUN 25* 21*  CREATININE 1.05*  0.87  CALCIUM 8.6* 8.8*   Liver Function Tests: No results for input(s): AST, ALT, ALKPHOS, BILITOT, PROT, ALBUMIN in the last 72 hours. No results for input(s): LIPASE, AMYLASE in the last 72 hours. No results for input(s): AMMONIA in the last 72 hours. CBC:  Recent Labs  03/02/16 1251 03/03/16 0629  WBC 15.9* 9.4  HGB 11.9* 11.3*  HCT 38.8 36.6  MCV 95.6 95.8  PLT 371 247   Cardiac Enzymes:  Recent Labs  03/02/16 1907 03/03/16 0055 03/03/16 0634  TROPONINI 0.03* 0.04* 0.03*   BNP: No results for input(s): PROBNP in the last 72 hours. D-Dimer:  Recent Labs  03/02/16 1251  DDIMER 2.16*   CBG:  Recent Labs  03/02/16 2047 03/03/16 0715  GLUCAP 344* 320*   Hemoglobin A1C: No results for input(s): HGBA1C in the last 72 hours. Fasting Lipid Panel: No results for input(s): CHOL, HDL, LDLCALC, TRIG, CHOLHDL, LDLDIRECT in the last 72 hours. Thyroid Function Tests: No results for input(s): TSH, T4TOTAL, FREET4, T3FREE, THYROIDAB in the last 72 hours. Anemia Panel: No results for input(s): VITAMINB12, FOLATE, FERRITIN, TIBC, IRON, RETICCTPCT in the last 72 hours. Coagulation: No results for input(s): LABPROT, INR in the last 72 hours. Urine Drug Screen: Drugs of Abuse  No results found for: LABOPIA, COCAINSCRNUR, LABBENZ, AMPHETMU, THCU, LABBARB  Alcohol Level: No results for input(s): ETH in the last 72 hours. Urinalysis: No results for input(s): COLORURINE, LABSPEC, PHURINE, GLUCOSEU, HGBUR, BILIRUBINUR, KETONESUR, PROTEINUR, UROBILINOGEN, NITRITE, LEUKOCYTESUR in the last 72 hours.  Invalid input(s): APPERANCEUR Misc. Labs:   ABGS: No results for input(s): PHART, PO2ART, TCO2, HCO3 in the last 72 hours.  Invalid input(s): PCO2   MICROBIOLOGY: Recent Results (from the past 240 hour(s))  MRSA PCR Screening     Status: None   Collection Time: 03/02/16  6:24 PM  Result Value Ref Range Status   MRSA by PCR NEGATIVE NEGATIVE Final    Comment:         The GeneXpert MRSA Assay (FDA approved for NASAL specimens only), is one component of a comprehensive MRSA colonization surveillance program. It is not intended to diagnose MRSA infection nor to guide or monitor treatment for MRSA infections.     Studies/Results: Dg Chest 2 View  Result Date: 03/02/2016 CLINICAL DATA:  COPD.  Worsening shortness of breath. EXAM: CHEST  2 VIEW COMPARISON:  01/09/2016. FINDINGS: Mediastinum hilar structures are unremarkable. Persistent cardiomegaly. Interval significant increase in right-sided pleural effusion. Large pleural effusion is now present. Underlying right lower lung disease cannot be excluded. No pneumothorax. IMPRESSION: 1. Interim significant increase in right-sided pleural effusion. Large right pleural effusion is now present. Underlying right lower lung disease cannot be excluded. 2. Stable cardiomegaly . Electronically Signed   By: Maisie Fus  Register  On: 03/02/2016 13:35   Ct Angio Chest Pe W Or Wo Contrast  Result Date: 03/02/2016 CLINICAL DATA:  Sudden onset of dyspnea and fever. Elevated D-dimer. EXAM: CT ANGIOGRAPHY CHEST WITH CONTRAST TECHNIQUE: Multidetector CT imaging of the chest was performed using the standard protocol during bolus administration of intravenous contrast. Multiplanar CT image reconstructions and MIPs were obtained to evaluate the vascular anatomy. CONTRAST:  100 cc Isovue 370 IV COMPARISON:  CXR 03/02/2016, chest CT 11/25/2015 FINDINGS: Cardiovascular: Heart is top normal in size. Diffuse coronary arteriosclerosis. No pericardial effusion. Aortic atherosclerosis without aneurysm or dissection. No large central pulmonary embolus. Mediastinum/Nodes: Stable 8 mm precarinal mediastinal lymph node with smaller upper paratracheal lymph nodes present. No hilar lymphadenopathy. Trachea is patent as are the mainstem bronchi. No definite esophageal abnormality. Lungs/Pleura: Large right effusion with compressive atelectasis sparing  portions of the right upper lobe and right lower lobe. Centrilobular emphysema of the aerated left lung with trace left effusion. No pneumonic consolidation or dominant mass. Upper Abdomen: Nodular thickening of the left adrenal gland possibly associated with a benign adenoma. No acute abnormality in the upper abdomen. Musculoskeletal: Mid thoracic degenerative disc disease. No acute osseous abnormality nor bone destruction. Review of the MIP images confirms the above findings. IMPRESSION: Large right effusion with compressive atelectasis sparing portions of the right upper and lower lobes. Centrilobular emphysema of the aerated left lung with trace left effusion. Aortic atherosclerosis with coronary arteriosclerosis. No acute pulmonary embolus. Stable nodular thickening of the left adrenal gland possibly associated with a stable adenoma or possibly mild hyperplasia. Electronically Signed   By: Tollie Eth M.D.   On: 03/02/2016 14:42    Medications:  Prior to Admission:  Prescriptions Prior to Admission  Medication Sig Dispense Refill Last Dose  . albuterol (PROVENTIL HFA;VENTOLIN HFA) 108 (90 Base) MCG/ACT inhaler Inhale 1-2 puffs into the lungs every 6 (six) hours as needed for wheezing or shortness of breath.   03/02/2016 at Unknown time  . albuterol (PROVENTIL) (2.5 MG/3ML) 0.083% nebulizer solution Take 2.5 mg by nebulization every 6 (six) hours as needed for wheezing or shortness of breath.   03/02/2016 at Unknown time  . ALPRAZolam (XANAX) 0.5 MG tablet Take 0.5 mg by mouth 3 (three) times daily as needed for anxiety.   03/01/2016 at Unknown time  . dexlansoprazole (DEXILANT) 60 MG capsule Take 60 mg by mouth daily.     03/02/2016 at Unknown time  . diltiazem (TIAZAC) 180 MG 24 hr capsule Take 180 mg by mouth 2 (two) times daily.   03/02/2016 at Unknown time  . ELIQUIS 5 MG TABS tablet TAKE 1 TABLET BY MOUTH TWICE DAILY 60 tablet 5 03/02/2016 at Unknown time  . furosemide (LASIX) 40 MG tablet Take 1 tablet  (40 mg total) by mouth daily. 30 tablet 1 03/02/2016 at Unknown time  . glipiZIDE (GLUCOTROL) 10 MG tablet Take 1 tablet (10 mg total) by mouth 2 (two) times daily before a meal. Do not take glipizide if blood sugar falls below 125.   03/02/2016 at Unknown time  . HYDROcodone-acetaminophen (NORCO/VICODIN) 5-325 MG tablet Take 1 tablet by mouth 2 (two) times daily as needed for moderate pain.   unknown  . insulin glargine (LANTUS) 100 UNIT/ML injection Inject 0.15 mLs (15 Units total) into the skin 2 (two) times daily. 10 mL 11 unknown at Unknown time  . levalbuterol (XOPENEX) 0.63 MG/3ML nebulizer solution Take 3 mLs (0.63 mg total) by nebulization every 3 (three) hours as needed for wheezing  or shortness of breath. 3 mL 12 03/02/2016 at Unknown time  . levofloxacin (LEVAQUIN) 500 MG tablet Take 500 mg by mouth daily.   03/02/2016 at Unknown time  . levothyroxine (SYNTHROID, LEVOTHROID) 100 MCG tablet Take 100 mcg by mouth daily before breakfast.   03/02/2016 at Unknown time  . meclizine (ANTIVERT) 25 MG tablet Take 12.5 mg by mouth daily as needed for dizziness.   unknown  . metoprolol tartrate (LOPRESSOR) 25 MG tablet Take 25 mg by mouth daily.    03/02/2016 at 0730  . nitroGLYCERIN (NITROSTAT) 0.4 MG SL tablet Place 1 tablet (0.4 mg total) under the tongue every 5 (five) minutes x 3 doses as needed for chest pain. For severe chest pain 25 tablet 3 unknown  . potassium chloride SA (K-DUR,KLOR-CON) 20 MEQ tablet Take 20 mEq by mouth daily.   03/02/2016 at Unknown time  . rosuvastatin (CRESTOR) 10 MG tablet Take 1 tablet (10 mg total) by mouth daily. (Patient taking differently: Take 10 mg by mouth at bedtime. ) 30 tablet 6 03/01/2016 at Unknown time  . spironolactone (ALDACTONE) 25 MG tablet Take 0.5 tablets (12.5 mg total) by mouth daily. 30 tablet 0 03/02/2016 at Unknown time  . Vitamin D, Ergocalciferol, (DRISDOL) 50000 units CAPS capsule Take 50,000 Units by mouth every 7 (seven) days. Takes on Mondays.   Past Week  at Unknown time  . Insulin Syringes, Disposable, U-100 1 ML MISC 15 Units by Does not apply route 2 (two) times daily. 200 each 6 01/04/2016 at Unknown time  . OXYGEN Inhale 2-3 L into the lungs continuous.   01/04/2016 at Unknown time  . predniSONE (DELTASONE) 5 MG tablet Label  & dispense according to the schedule below. 10 Pills PO for 3 days then, 8 Pills PO for 3 days, 6 Pills PO for 3 days, 4 Pills PO for 3 days, 2 Pills PO for 3 days, 1 Pills PO for 3 days, 1/2 Pill  PO for 3 days then STOP. Total 95 pills. (Patient not taking: Reported on 03/02/2016) 95 tablet 0 Completed Course at Unknown time   Scheduled: . apixaban  5 mg Oral BID  . aspirin  81 mg Oral Daily  . furosemide  40 mg Oral Daily  . insulin aspart  0-15 Units Subcutaneous TID WC  . insulin aspart  0-5 Units Subcutaneous QHS  . insulin glargine  15 Units Subcutaneous BID  . ipratropium-albuterol  3 mL Nebulization Q6H  . levofloxacin  500 mg Oral Daily  . levothyroxine  100 mcg Oral QAC breakfast  . mouth rinse  15 mL Mouth Rinse BID  . methylPREDNISolone (SOLU-MEDROL) injection  60 mg Intravenous Q12H  . metoprolol tartrate  25 mg Oral Daily  . pantoprazole  80 mg Oral Daily  . potassium chloride SA  20 mEq Oral Daily  . rosuvastatin  10 mg Oral QHS  . spironolactone  12.5 mg Oral Daily   Continuous: . diltiazem (CARDIZEM) infusion 15 mg/hr (03/03/16 0327)   ZOX:[id number], albuterol, ALPRAZolam, HYDROcodone-acetaminophen, meclizine, ondansetron (ZOFRAN) IV  Assesment: She was admitted with atrial fib with rapid ventricular response. She has acute on chronic hypoxic respiratory failure with COPD exacerbation and a pleural effusion on the right. She had pneumonia previously and her pleural effusion seem to be parapneumonic. Discussed with radiology today and she will have thoracentesis. Agree with using Levaquin and giving her IV steroids. She has nebulizer treatments ordered and will see how that does with her rapid  ventricular response. Principal  Problem:   Atrial fibrillation with rapid ventricular response (HCC) Active Problems:   Chronic anticoagulation-CHADs VASc=6   Acute on chronic respiratory failure with hypoxia (HCC)   Uncontrolled type 2 diabetes mellitus with complication (HCC)   COPD (chronic obstructive pulmonary disease) (HCC)   Pressure injury of skin   Pleural effusion on right   Edema of right lower extremity    Plan: As above    LOS: 1 day   Nelsie Domino L 03/03/2016, 8:13 AM

## 2016-03-04 LAB — CBC
HCT: 37.6 % (ref 36.0–46.0)
Hemoglobin: 11.5 g/dL — ABNORMAL LOW (ref 12.0–15.0)
MCH: 29.2 pg (ref 26.0–34.0)
MCHC: 30.6 g/dL (ref 30.0–36.0)
MCV: 95.4 fL (ref 78.0–100.0)
PLATELETS: 240 10*3/uL (ref 150–400)
RBC: 3.94 MIL/uL (ref 3.87–5.11)
RDW: 18.3 % — AB (ref 11.5–15.5)
WBC: 16.7 10*3/uL — AB (ref 4.0–10.5)

## 2016-03-04 LAB — GLUCOSE, CAPILLARY
GLUCOSE-CAPILLARY: 340 mg/dL — AB (ref 65–99)
GLUCOSE-CAPILLARY: 234 mg/dL — AB (ref 65–99)
Glucose-Capillary: 368 mg/dL — ABNORMAL HIGH (ref 65–99)
Glucose-Capillary: 405 mg/dL — ABNORMAL HIGH (ref 65–99)

## 2016-03-04 LAB — BASIC METABOLIC PANEL
ANION GAP: 6 (ref 5–15)
BUN: 27 mg/dL — ABNORMAL HIGH (ref 6–20)
CALCIUM: 8.8 mg/dL — AB (ref 8.9–10.3)
CO2: 31 mmol/L (ref 22–32)
CREATININE: 0.84 mg/dL (ref 0.44–1.00)
Chloride: 95 mmol/L — ABNORMAL LOW (ref 101–111)
GFR calc Af Amer: >60 mL/min (ref >60)
GLUCOSE: 373 mg/dL — AB (ref 65–99)
Potassium: 3.6 mmol/L (ref 3.5–5.1)
Sodium: 132 mmol/L — ABNORMAL LOW (ref 135–145)

## 2016-03-04 LAB — PH, BODY FLUID: PH, BODY FLUID: 7.8

## 2016-03-04 LAB — GLUCOSE, RANDOM: Glucose, Bld: 420 mg/dL — ABNORMAL HIGH (ref 65–99)

## 2016-03-04 LAB — HEMOGLOBIN A1C
Hgb A1c MFr Bld: 9.1 % — ABNORMAL HIGH (ref 4.8–5.6)
Mean Plasma Glucose: 214 mg/dL

## 2016-03-04 MED ORDER — PREDNISONE 20 MG PO TABS
40.0000 mg | ORAL_TABLET | Freq: Every day | ORAL | Status: AC
  Administered 2016-03-05 – 2016-03-07 (×3): 40 mg via ORAL
  Filled 2016-03-04 (×3): qty 2

## 2016-03-04 MED ORDER — INSULIN GLARGINE 100 UNIT/ML ~~LOC~~ SOLN
25.0000 [IU] | Freq: Two times a day (BID) | SUBCUTANEOUS | Status: DC
  Administered 2016-03-04 – 2016-03-06 (×4): 25 [IU] via SUBCUTANEOUS
  Filled 2016-03-04 (×6): qty 0.25

## 2016-03-04 MED ORDER — INSULIN ASPART 100 UNIT/ML ~~LOC~~ SOLN
5.0000 [IU] | Freq: Three times a day (TID) | SUBCUTANEOUS | Status: DC
  Administered 2016-03-04 – 2016-03-08 (×12): 5 [IU] via SUBCUTANEOUS

## 2016-03-04 NOTE — Progress Notes (Signed)
Subjective: She was admitted with atrial fibrillation with rapid ventricular response and a large right pleural effusion. She is substantially better. She had thoracentesis and 1.5 L was removed. She has no new complaints. She's not having any chest pain. Her breathing is much improved.  Objective: Vital signs in last 24 hours: Temp:  [97.6 F (36.4 C)-98.8 F (37.1 C)] 98.5 F (36.9 C) (01/05 0400) Pulse Rate:  [73-137] 90 (01/05 0600) Resp:  [16-34] 17 (01/05 0600) BP: (88-137)/(53-104) 88/54 (01/05 0600) SpO2:  [91 %-99 %] 98 % (01/05 0600) Weight:  [72.5 kg (159 lb 13.3 oz)] 72.5 kg (159 lb 13.3 oz) (01/05 0500) Weight change: 1.285 kg (2 lb 13.3 oz)    Intake/Output from previous day: 01/04 0701 - 01/05 0700 In: 855 [P.O.:580; I.V.:275] Out: 2650 [Urine:1150]  PHYSICAL EXAM General appearance: alert, cooperative and no distress Resp: clear to auscultation bilaterally Cardio: Heart is irregularly irregular but heart rate now about 100 GI: soft, non-tender; bowel sounds normal; no masses,  no organomegaly Extremities: extremities normal, atraumatic, no cyanosis or edema Skin warm and dry. Mucous membranes are moist.  Lab Results:  Results for orders placed or performed during the hospital encounter of 03/02/16 (from the past 48 hour(s))  Basic metabolic panel     Status: Abnormal   Collection Time: 03/02/16 12:51 PM  Result Value Ref Range   Sodium 132 (L) 135 - 145 mmol/L   Potassium 3.6 3.5 - 5.1 mmol/L   Chloride 96 (L) 101 - 111 mmol/L   CO2 28 22 - 32 mmol/L   Glucose, Bld 263 (H) 65 - 99 mg/dL   BUN 25 (H) 6 - 20 mg/dL   Creatinine, Ser 1.05 (H) 0.44 - 1.00 mg/dL   Calcium 8.6 (L) 8.9 - 10.3 mg/dL   GFR calc non Af Amer 51 (L) >60 mL/min   GFR calc Af Amer 59 (L) >60 mL/min    Comment: (NOTE) The eGFR has been calculated using the CKD EPI equation. This calculation has not been validated in all clinical situations. eGFR's persistently <60 mL/min signify  possible Chronic Kidney Disease.    Anion gap 8 5 - 15  CBC     Status: Abnormal   Collection Time: 03/02/16 12:51 PM  Result Value Ref Range   WBC 15.9 (H) 4.0 - 10.5 K/uL   RBC 4.06 3.87 - 5.11 MIL/uL   Hemoglobin 11.9 (L) 12.0 - 15.0 g/dL   HCT 38.8 36.0 - 46.0 %   MCV 95.6 78.0 - 100.0 fL   MCH 29.3 26.0 - 34.0 pg   MCHC 30.7 30.0 - 36.0 g/dL   RDW 18.5 (H) 11.5 - 15.5 %   Platelets 371 150 - 400 K/uL  Troponin I     Status: Abnormal   Collection Time: 03/02/16 12:51 PM  Result Value Ref Range   Troponin I 0.04 (HH) <0.03 ng/mL    Comment: CRITICAL RESULT CALLED TO, READ BACK BY AND VERIFIED WITH: HASKINS,K AT 1340 ON 03/02/16 BY ISLEY,B   Lactic acid, plasma     Status: None   Collection Time: 03/02/16 12:51 PM  Result Value Ref Range   Lactic Acid, Venous 1.5 0.5 - 1.9 mmol/L  D-dimer, quantitative (not at [facility])     Status: Abnormal   Collection Time: 03/02/16 12:51 PM  Result Value Ref Range   D-Dimer, Quant 2.16 (H) 0.00 - 0.50 ug/mL-FEU    Comment: (NOTE) At the manufacturer cut-off of 0.50 ug/mL FEU, this assay has been  documented to exclude PE with a sensitivity and negative predictive value of 97 to 99%.  At this time, this assay has not been approved by the FDA to exclude DVT/VTE. Results should be correlated with clinical presentation.   Brain natriuretic peptide     Status: Abnormal   Collection Time: 03/02/16 12:51 PM  Result Value Ref Range   B Natriuretic Peptide 263.0 (H) 0.0 - 100.0 pg/mL  Lactic acid, plasma     Status: None   Collection Time: 03/02/16  3:41 PM  Result Value Ref Range   Lactic Acid, Venous 1.4 0.5 - 1.9 mmol/L  Troponin I     Status: Abnormal   Collection Time: 03/02/16  3:41 PM  Result Value Ref Range   Troponin I 0.04 (HH) <0.03 ng/mL    Comment: CRITICAL VALUE NOTED.  VALUE IS CONSISTENT WITH PREVIOUSLY REPORTED AND CALLED VALUE.  MRSA PCR Screening     Status: None   Collection Time: 03/02/16  6:24 PM  Result Value Ref Range    MRSA by PCR NEGATIVE NEGATIVE    Comment:        The GeneXpert MRSA Assay (FDA approved for NASAL specimens only), is one component of a comprehensive MRSA colonization surveillance program. It is not intended to diagnose MRSA infection nor to guide or monitor treatment for MRSA infections.   Troponin I     Status: Abnormal   Collection Time: 03/02/16  7:07 PM  Result Value Ref Range   Troponin I 0.03 (HH) <0.03 ng/mL    Comment: CRITICAL VALUE NOTED.  VALUE IS CONSISTENT WITH PREVIOUSLY REPORTED AND CALLED VALUE.  Glucose, capillary     Status: Abnormal   Collection Time: 03/02/16  8:47 PM  Result Value Ref Range   Glucose-Capillary 344 (H) 65 - 99 mg/dL  Troponin I     Status: Abnormal   Collection Time: 03/03/16 12:55 AM  Result Value Ref Range   Troponin I 0.04 (HH) <0.03 ng/mL    Comment: CRITICAL VALUE NOTED.  VALUE IS CONSISTENT WITH PREVIOUSLY REPORTED AND CALLED VALUE.  Basic metabolic panel     Status: Abnormal   Collection Time: 03/03/16  6:29 AM  Result Value Ref Range   Sodium 133 (L) 135 - 145 mmol/L   Potassium 4.2 3.5 - 5.1 mmol/L   Chloride 95 (L) 101 - 111 mmol/L   CO2 31 22 - 32 mmol/L   Glucose, Bld 346 (H) 65 - 99 mg/dL   BUN 21 (H) 6 - 20 mg/dL   Creatinine, Ser 0.87 0.44 - 1.00 mg/dL   Calcium 8.8 (L) 8.9 - 10.3 mg/dL   GFR calc non Af Amer >60 >60 mL/min   GFR calc Af Amer >60 >60 mL/min    Comment: (NOTE) The eGFR has been calculated using the CKD EPI equation. This calculation has not been validated in all clinical situations. eGFR's persistently <60 mL/min signify possible Chronic Kidney Disease.    Anion gap 7 5 - 15  CBC     Status: Abnormal   Collection Time: 03/03/16  6:29 AM  Result Value Ref Range   WBC 9.4 4.0 - 10.5 K/uL   RBC 3.82 (L) 3.87 - 5.11 MIL/uL   Hemoglobin 11.3 (L) 12.0 - 15.0 g/dL   HCT 36.6 36.0 - 46.0 %   MCV 95.8 78.0 - 100.0 fL   MCH 29.6 26.0 - 34.0 pg   MCHC 30.9 30.0 - 36.0 g/dL   RDW 18.3 (H) 11.5 - 15.5  %  Platelets 247 150 - 400 K/uL  Troponin I     Status: Abnormal   Collection Time: 03/03/16  6:34 AM  Result Value Ref Range   Troponin I 0.03 (HH) <0.03 ng/mL    Comment: CRITICAL VALUE NOTED.  VALUE IS CONSISTENT WITH PREVIOUSLY REPORTED AND CALLED VALUE.  Hemoglobin A1c     Status: Abnormal   Collection Time: 03/03/16  6:39 AM  Result Value Ref Range   Hgb A1c MFr Bld 9.1 (H) 4.8 - 5.6 %    Comment: (NOTE)         Pre-diabetes: 5.7 - 6.4         Diabetes: >6.4         Glycemic control for adults with diabetes: <7.0    Mean Plasma Glucose 214 mg/dL    Comment: (NOTE) Performed At: [facility] [place], [place] [id number] Lindon Romp MD NI:[id number]   Glucose, capillary     Status: Abnormal   Collection Time: 03/03/16  7:15 AM  Result Value Ref Range   Glucose-Capillary 320 (H) 65 - 99 mg/dL  Glucose, capillary     Status: Abnormal   Collection Time: 03/03/16 11:41 AM  Result Value Ref Range   Glucose-Capillary 273 (H) 65 - 99 mg/dL  Lactate dehydrogenase (CSF, pleural or peritoneal fluid)     Status: Abnormal   Collection Time: 03/03/16 12:05 PM  Result Value Ref Range   LD, Fluid 56 (H) 3 - 23 U/L    Comment: (NOTE) Results should be evaluated in conjunction with serum values    Fluid Type-FLDH Pleural R   Protein, pleural or peritoneal fluid     Status: None   Collection Time: 03/03/16 12:05 PM  Result Value Ref Range   Total protein, fluid <3.0 g/dL    Comment: (NOTE) No normal range established for this test Results should be evaluated in conjunction with serum values    Fluid Type-FTP Pleural R   Body fluid cell count with differential     Status: Abnormal   Collection Time: 03/03/16 12:05 PM  Result Value Ref Range   Fluid Type-FCT PLEURAL    Color, Fluid YELLOW (A) YELLOW   Appearance, Fluid CLEAR CLEAR   WBC, Fluid 109 0 - 1,000 cu mm   Neutrophil Count, Fluid 34 (H) 0 - 25 %   Lymphs, Fluid 55 %    Monocyte-Macrophage-Serous Fluid 11 (L) 50 - 90 %   Eos, Fluid 0 %   Other Cells, Fluid OTHER CELLS IDENTIFIED AS MESOTHELIAL CELLS %  Glucose, pleural or peritoneal fluid     Status: None   Collection Time: 03/03/16 12:05 PM  Result Value Ref Range   Glucose, Fluid 332 mg/dL    Comment: (NOTE) No normal range established for this test Results should be evaluated in conjunction with serum values    Fluid Type-FGLU Pleural R   Gram stain     Status: None   Collection Time: 03/03/16 12:05 PM  Result Value Ref Range   Specimen Description PLEURAL COLLECTED BY DOCTOR    Special Requests BOTTLES DRAWN AEROBIC AND ANAEROBIC    Gram Stain      NO ORGANISMS SEEN WBC PRESENT,BOTH PMN AND MONONUCLEAR CYTOSPIN SMEAR    Report Status 03/03/2016 FINAL   Glucose, capillary     Status: Abnormal   Collection Time: 03/03/16  3:39 PM  Result Value Ref Range   Glucose-Capillary 250 (H) 65 - 99 mg/dL   Comment 1 Notify  RN    Comment 2 Document in Chart   Glucose, capillary     Status: Abnormal   Collection Time: 03/03/16  8:33 PM  Result Value Ref Range   Glucose-Capillary 282 (H) 65 - 99 mg/dL   Comment 1 Notify RN    Comment 2 Document in Chart   CBC     Status: Abnormal   Collection Time: 03/04/16  4:43 AM  Result Value Ref Range   WBC 16.7 (H) 4.0 - 10.5 K/uL   RBC 3.94 3.87 - 5.11 MIL/uL   Hemoglobin 11.5 (L) 12.0 - 15.0 g/dL   HCT 37.6 36.0 - 46.0 %   MCV 95.4 78.0 - 100.0 fL   MCH 29.2 26.0 - 34.0 pg   MCHC 30.6 30.0 - 36.0 g/dL   RDW 18.3 (H) 11.5 - 15.5 %   Platelets 240 150 - 400 K/uL  Basic metabolic panel     Status: Abnormal   Collection Time: 03/04/16  4:43 AM  Result Value Ref Range   Sodium 132 (L) 135 - 145 mmol/L   Potassium 3.6 3.5 - 5.1 mmol/L   Chloride 95 (L) 101 - 111 mmol/L   CO2 31 22 - 32 mmol/L   Glucose, Bld 373 (H) 65 - 99 mg/dL   BUN 27 (H) 6 - 20 mg/dL   Creatinine, Ser 0.84 0.44 - 1.00 mg/dL   Calcium 8.8 (L) 8.9 - 10.3 mg/dL   GFR calc non Af  Amer >60 >60 mL/min   GFR calc Af Amer >60 >60 mL/min    Comment: (NOTE) The eGFR has been calculated using the CKD EPI equation. This calculation has not been validated in all clinical situations. eGFR's persistently <60 mL/min signify possible Chronic Kidney Disease.    Anion gap 6 5 - 15  Glucose, capillary     Status: Abnormal   Collection Time: 03/04/16  7:58 AM  Result Value Ref Range   Glucose-Capillary 368 (H) 65 - 99 mg/dL   Comment 1 Notify RN    Comment 2 Document in Chart     ABGS No results for input(s): PHART, PO2ART, TCO2, HCO3 in the last 72 hours.  Invalid input(s): PCO2 CULTURES Recent Results (from the past 240 hour(s))  MRSA PCR Screening     Status: None   Collection Time: 03/02/16  6:24 PM  Result Value Ref Range Status   MRSA by PCR NEGATIVE NEGATIVE Final    Comment:        The GeneXpert MRSA Assay (FDA approved for NASAL specimens only), is one component of a comprehensive MRSA colonization surveillance program. It is not intended to diagnose MRSA infection nor to guide or monitor treatment for MRSA infections.   Gram stain     Status: None   Collection Time: 03/03/16 12:05 PM  Result Value Ref Range Status   Specimen Description PLEURAL COLLECTED BY DOCTOR  Final   Special Requests BOTTLES DRAWN AEROBIC AND ANAEROBIC  Final   Gram Stain   Final    NO ORGANISMS SEEN WBC PRESENT,BOTH PMN AND MONONUCLEAR CYTOSPIN SMEAR    Report Status 03/03/2016 FINAL  Final   Studies/Results: Dg Chest 2 View  Result Date: 03/02/2016 CLINICAL DATA:  COPD.  Worsening shortness of breath. EXAM: CHEST  2 VIEW COMPARISON:  01/09/2016. FINDINGS: Mediastinum hilar structures are unremarkable. Persistent cardiomegaly. Interval significant increase in right-sided pleural effusion. Large pleural effusion is now present. Underlying right lower lung disease cannot be excluded. No pneumothorax. IMPRESSION: 1. Interim significant  increase in right-sided pleural effusion.  Large right pleural effusion is now present. Underlying right lower lung disease cannot be excluded. 2. Stable cardiomegaly . Electronically Signed   By: Marcello Moores  Register   On: 03/02/2016 13:35   Ct Angio Chest Pe W Or Wo Contrast  Result Date: 03/02/2016 CLINICAL DATA:  Sudden onset of dyspnea and fever. Elevated D-dimer. EXAM: CT ANGIOGRAPHY CHEST WITH CONTRAST TECHNIQUE: Multidetector CT imaging of the chest was performed using the standard protocol during bolus administration of intravenous contrast. Multiplanar CT image reconstructions and MIPs were obtained to evaluate the vascular anatomy. CONTRAST:  100 cc Isovue 370 IV COMPARISON:  CXR 03/02/2016, chest CT 11/25/2015 FINDINGS: Cardiovascular: Heart is top normal in size. Diffuse coronary arteriosclerosis. No pericardial effusion. Aortic atherosclerosis without aneurysm or dissection. No large central pulmonary embolus. Mediastinum/Nodes: Stable 8 mm precarinal mediastinal lymph node with smaller upper paratracheal lymph nodes present. No hilar lymphadenopathy. Trachea is patent as are the mainstem bronchi. No definite esophageal abnormality. Lungs/Pleura: Large right effusion with compressive atelectasis sparing portions of the right upper lobe and right lower lobe. Centrilobular emphysema of the aerated left lung with trace left effusion. No pneumonic consolidation or dominant mass. Upper Abdomen: Nodular thickening of the left adrenal gland possibly associated with a benign adenoma. No acute abnormality in the upper abdomen. Musculoskeletal: Mid thoracic degenerative disc disease. No acute osseous abnormality nor bone destruction. Review of the MIP images confirms the above findings. IMPRESSION: Large right effusion with compressive atelectasis sparing portions of the right upper and lower lobes. Centrilobular emphysema of the aerated left lung with trace left effusion. Aortic atherosclerosis with coronary arteriosclerosis. No acute pulmonary embolus.  Stable nodular thickening of the left adrenal gland possibly associated with a stable adenoma or possibly mild hyperplasia. Electronically Signed   By: Ashley Royalty M.D.   On: 03/02/2016 14:42   US Venous Img Lower Unilateral Right  Result Date: 03/03/2016 CLINICAL DATA:  Right lower extremity swelling for 2 weeks with intermittent pain EXAM: RIGHT LOWER EXTREMITY VENOUS DOPPLER ULTRASOUND TECHNIQUE: Gray-scale sonography with graded compression, as well as color Doppler and duplex ultrasound were performed to evaluate the lower extremity deep venous systems from the level of the common femoral vein and including the common femoral, femoral, profunda femoral, popliteal and calf veins including the posterior tibial, peroneal and gastrocnemius veins when visible. The superficial great saphenous vein was also interrogated. Spectral Doppler was utilized to evaluate flow at rest and with distal augmentation maneuvers in the common femoral, femoral and popliteal veins. COMPARISON:  None. FINDINGS: Contralateral Common Femoral Vein: Respiratory phasicity is normal and symmetric with the symptomatic side. No evidence of thrombus. Normal compressibility. Common Femoral Vein: No evidence of thrombus. Normal compressibility, respiratory phasicity and response to augmentation. Saphenofemoral Junction: No evidence of thrombus. Normal compressibility and flow on color Doppler imaging. Profunda Femoral Vein: No evidence of thrombus. Normal compressibility and flow on color Doppler imaging. Femoral Vein: No evidence of thrombus. Normal compressibility, respiratory phasicity and response to augmentation. Popliteal Vein: No evidence of thrombus. Normal compressibility, respiratory phasicity and response to augmentation. Calf Veins: No evidence of thrombus. Normal compressibility and flow on color Doppler imaging. Superficial Great Saphenous Vein: No evidence of thrombus. Normal compressibility and flow on color Doppler imaging.  Venous Reflux:  None. Other Findings:  Subcutaneous calf and ankle ankle edema noted. IMPRESSION: No evidence of deep venous thrombosis. Electronically Signed   By: Jerilynn Mages.  Shick M.D.   On: 03/03/2016 15:42   Dg Chest  Port 1 View  Result Date: 03/03/2016 CLINICAL DATA:  Status post right thoracentesis today. EXAM: PORTABLE CHEST 1 VIEW COMPARISON:  CT chest and PA and lateral chest 03/02/2016. FINDINGS: Right pleural effusion is markedly decreased after thoracentesis. No pneumothorax. Small residual right pleural effusion and basilar atelectasis are noted. Mild atelectasis is present in the left lung base. There is cardiomegaly. Aortic atherosclerosis noted. IMPRESSION: Marked decrease right pleural effusion after thoracentesis. Negative for pneumothorax. Cardiomegaly without edema. Electronically Signed   By: Inge Rise M.D.   On: 03/03/2016 12:27   US Thoracentesis Asp Pleural Space W/img Guide  Result Date: 03/03/2016 INDICATION: RIGHT pleural effusion EXAM: ULTRASOUND GUIDED DIAGNOSTIC AND THERAPEUTIC RIGHT THORACENTESIS MEDICATIONS: None. COMPLICATIONS: None immediate. PROCEDURE: Procedure, benefits, and risks of procedure were discussed with patient. Written informed consent for procedure was obtained. Time out protocol followed. Pleural effusion localized by ultrasound at the posterior RIGHT hemithorax. Skin prepped and draped in usual sterile fashion. Skin and soft tissues anesthetized with 10 mL of 1% lidocaine. 8 French thoracentesis catheter placed into the RIGHT pleural space. 1.5 L of clear yellow aspirated by syringe pump. Procedure tolerated well by patient without immediate complication. FINDINGS: A total of approximately 1.5 L of RIGHT pleural fluid was removed. Samples were sent to the laboratory as requested by the clinical team. IMPRESSION: Successful ultrasound guided RIGHT thoracentesis yielding 1.5 L of pleural fluid. Electronically Signed   By: Lavonia Dana M.D.   On: 03/03/2016 12:28     Medications:  Prior to Admission:  Prescriptions Prior to Admission  Medication Sig Dispense Refill Last Dose  . albuterol (PROVENTIL HFA;VENTOLIN HFA) 108 (90 Base) MCG/ACT inhaler Inhale 1-2 puffs into the lungs every 6 (six) hours as needed for wheezing or shortness of breath.   03/02/2016 at Unknown time  . albuterol (PROVENTIL) (2.5 MG/3ML) 0.083% nebulizer solution Take 2.5 mg by nebulization every 6 (six) hours as needed for wheezing or shortness of breath.   03/02/2016 at Unknown time  . ALPRAZolam (XANAX) 0.5 MG tablet Take 0.5 mg by mouth 3 (three) times daily as needed for anxiety.   03/01/2016 at Unknown time  . dexlansoprazole (DEXILANT) 60 MG capsule Take 60 mg by mouth daily.     03/02/2016 at Unknown time  . diltiazem (TIAZAC) 180 MG 24 hr capsule Take 180 mg by mouth 2 (two) times daily.   03/02/2016 at Unknown time  . ELIQUIS 5 MG TABS tablet TAKE 1 TABLET BY MOUTH TWICE DAILY 60 tablet 5 03/02/2016 at Unknown time  . furosemide (LASIX) 40 MG tablet Take 1 tablet (40 mg total) by mouth daily. 30 tablet 1 03/02/2016 at Unknown time  . glipiZIDE (GLUCOTROL) 10 MG tablet Take 1 tablet (10 mg total) by mouth 2 (two) times daily before a meal. Do not take glipizide if blood sugar falls below 125.   03/02/2016 at Unknown time  . HYDROcodone-acetaminophen (NORCO/VICODIN) 5-325 MG tablet Take 1 tablet by mouth 2 (two) times daily as needed for moderate pain.   unknown  . insulin glargine (LANTUS) 100 UNIT/ML injection Inject 0.15 mLs (15 Units total) into the skin 2 (two) times daily. 10 mL 11 unknown at Unknown time  . levalbuterol (XOPENEX) 0.63 MG/3ML nebulizer solution Take 3 mLs (0.63 mg total) by nebulization every 3 (three) hours as needed for wheezing or shortness of breath. 3 mL 12 03/02/2016 at Unknown time  . levofloxacin (LEVAQUIN) 500 MG tablet Take 500 mg by mouth daily.   03/02/2016 at Unknown time  .  levothyroxine (SYNTHROID, LEVOTHROID) 100 MCG tablet Take 100 mcg by mouth daily before  breakfast.   03/02/2016 at Unknown time  . meclizine (ANTIVERT) 25 MG tablet Take 12.5 mg by mouth daily as needed for dizziness.   unknown  . metoprolol tartrate (LOPRESSOR) 25 MG tablet Take 25 mg by mouth daily.    03/02/2016 at 0730  . nitroGLYCERIN (NITROSTAT) 0.4 MG SL tablet Place 1 tablet (0.4 mg total) under the tongue every 5 (five) minutes x 3 doses as needed for chest pain. For severe chest pain 25 tablet 3 unknown  . potassium chloride SA (K-DUR,KLOR-CON) 20 MEQ tablet Take 20 mEq by mouth daily.   03/02/2016 at Unknown time  . rosuvastatin (CRESTOR) 10 MG tablet Take 1 tablet (10 mg total) by mouth daily. (Patient taking differently: Take 10 mg by mouth at bedtime. ) 30 tablet 6 03/01/2016 at Unknown time  . spironolactone (ALDACTONE) 25 MG tablet Take 0.5 tablets (12.5 mg total) by mouth daily. 30 tablet 0 03/02/2016 at Unknown time  . Vitamin D, Ergocalciferol, (DRISDOL) 50000 units CAPS capsule Take 50,000 Units by mouth every 7 (seven) days. Takes on Mondays.   Past Week at Unknown time  . Insulin Syringes, Disposable, U-100 1 ML MISC 15 Units by Does not apply route 2 (two) times daily. 200 each 6 01/04/2016 at Unknown time  . OXYGEN Inhale 2-3 L into the lungs continuous.   01/04/2016 at Unknown time  . predniSONE (DELTASONE) 5 MG tablet Label  & dispense according to the schedule below. 10 Pills PO for 3 days then, 8 Pills PO for 3 days, 6 Pills PO for 3 days, 4 Pills PO for 3 days, 2 Pills PO for 3 days, 1 Pills PO for 3 days, 1/2 Pill  PO for 3 days then STOP. Total 95 pills. (Patient not taking: Reported on 03/02/2016) 95 tablet 0 Completed Course at Unknown time   Scheduled: . apixaban  5 mg Oral BID  . aspirin  81 mg Oral Daily  . feeding supplement (GLUCERNA SHAKE)  237 mL Oral TID BM  . furosemide  40 mg Oral Daily  . insulin aspart  0-20 Units Subcutaneous TID WC  . insulin aspart  0-5 Units Subcutaneous QHS  . insulin aspart  3 Units Subcutaneous TID WC  . insulin glargine  20  Units Subcutaneous BID  . ipratropium-albuterol  3 mL Nebulization Q6H  . levofloxacin  500 mg Oral Daily  . levothyroxine  100 mcg Oral QAC breakfast  . mouth rinse  15 mL Mouth Rinse BID  . methylPREDNISolone (SOLU-MEDROL) injection  60 mg Intravenous Q12H  . metoprolol tartrate  25 mg Oral Daily  . pantoprazole  80 mg Oral Daily  . potassium chloride SA  20 mEq Oral Daily  . rosuvastatin  10 mg Oral QHS  . spironolactone  12.5 mg Oral Daily   Continuous: . diltiazem (CARDIZEM) infusion 15 mg/hr (03/04/16 0448)   VFI:EPPIRJJOACZYS, albuterol, ALPRAZolam, HYDROcodone-acetaminophen, meclizine, ondansetron (ZOFRAN) IV  Assesment: She was admitted with atrial fibrillation with rapid ventricular response COPD exacerbation acute on chronic hypoxic respiratory failure and a large right pleural effusion. She is improving. Her centesis removed about 1.5 L. She wants to know if her pleural effusion is likely to continue to be a problem and I told her that we don't have the answer to that yet and we will have to see how she does as an outpatient. Her respiratory status is better. Her atrial fib is better. Principal  Problem:   Atrial fibrillation with rapid ventricular response (HCC) Active Problems:   Chronic anticoagulation-CHADs VASc=6   Acute on chronic respiratory failure with hypoxia (HCC)   Uncontrolled type 2 diabetes mellitus with complication (HCC)   COPD (chronic obstructive pulmonary disease) (HCC)   Pressure injury of skin   Pleural effusion on right   Edema of right lower extremity    Plan: Continue current treatments. I will be out of town starting about 1:00 today and return on Monday morning.    LOS: 2 days   Tavarion Babington L 03/04/2016, 8:08 AM

## 2016-03-04 NOTE — Progress Notes (Signed)
PROGRESS NOTE    [name] [name]  ZOX:[id number] DOB: [birth year] DOA: 03/02/2016 PCP: Ignatius Specking, MD    Brief Narrative: [name] [name] is a 76 y.o. female with medical history significant of DM, hypothyroidism, HLD, HTN, COPD on home O2, and afib presents with difficulty  Breathing, and non productive cough, associated with afib with RVR.  She was admitted to step down, started on cardizem gtt and pulmonary consulted.   Assessment & Plan:   Principal Problem:   Atrial fibrillation with rapid ventricular response (HCC) Active Problems:   Chronic anticoagulation-CHADs VASc=6   Acute on chronic respiratory failure with hypoxia (HCC)   Uncontrolled type 2 diabetes mellitus with complication (HCC)   COPD (chronic obstructive pulmonary disease) (HCC)   Pressure injury of skin   Pleural effusion on right   Edema of right lower extremity   Acute on chronic respiratory failure with hypoxia; secondary to COPD exacerbation and right large pleural effusion.  Admitted to step down, started on IV steroids, duonebs, levaquin.  IR consulted for thoracentesis and the pleural fluid  About 1.5 lit of yellow serous fluid drained and sent for analysis. Pleural fluid protein is less than 3 g/dl, and gram stain is negative. Awaiting culture report. Would transition to po steroids as she is not wheezing.      Atrial fibrillation with RVR:  On Cardizem gtt for rate control. Resume po lopressor.  Resume eliquis for anti coagulation.  Probably brought on by increased work of breathing and duonebs.  Still remains ont he gtt.   Uncontrolled DM: hgba1c 9.1 not well controlled.  SSI .  CBG (last 3)   Recent Labs  03/03/16 2033 03/04/16 0758 03/04/16 1214  GLUCAP 282* 368* 405*    Increased lantus to 25 units BID, added novolog 5 units tidac, and increased SSI to resistant scale.     Chronic diastolic heart failure/ CAD S/P PCI:  Resume lasix, spironolactone, aspirin and statin.    Hypothyroidism: resume synthroid.   DVT prophylaxis: Eliquis.  Code Status: full code.  Family Communication: none at bedside.  Disposition Plan: pending further eval.    Consultants:   Pulmonary consult.    Procedures: thoracentesis.    Antimicrobials: levaquin   Subjective: Feels better currently on 4 lit of [place] oxygen.   Objective: Vitals:   03/04/16 1029 03/04/16 1107 03/04/16 1130 03/04/16 1438  BP:  114/77 128/74   Pulse:  100 (!) 104   Resp:   (!) 32   Temp:   97.4 F (36.3 C)   TempSrc:   Oral   SpO2: 97%  97% 91%  Weight:      Height:        Intake/Output Summary (Last 24 hours) at 03/04/16 1451 Last data filed at 03/04/16 1200  Gross per 24 hour  Intake           611.72 ml  Output             1150 ml  Net          -538.28 ml   Filed Weights   03/02/16 1858 03/03/16 0449 03/04/16 0500  Weight: 71.5 kg (157 lb 10.1 oz) 71.8 kg (158 lb 4.6 oz) 72.5 kg (159 lb 13.3 oz)    Examination:  General exam: comfortable sitting in the chair.  Respiratory system: air entry fair bilateral.  Cardiovascular system: S1 & S2 heard, irregular. No JVD, murmurs, rubs, gallops or clicks. No pedal edema. Gastrointestinal system: Abdomen is nondistended,  soft and nontender. No organomegaly or masses felt. Normal bowel sounds heard. Central nervous system: Alert and oriented. No focal neurological deficits. Extremities: Symmetric 5 x 5 power. Skin: No rashes, lesions or ulcers Psychiatry: Judgement and insight appear normal. Mood & affect appropriate.     Data Reviewed: I have personally reviewed following labs and imaging studies  CBC:  Recent Labs Lab 03/02/16 1251 03/03/16 0629 03/04/16 0443  WBC 15.9* 9.4 16.7*  HGB 11.9* 11.3* 11.5*  HCT 38.8 36.6 37.6  MCV 95.6 95.8 95.4  PLT 371 247 240   Basic Metabolic Panel:  Recent Labs Lab 03/02/16 1251 03/03/16 0629 03/04/16 0443 03/04/16 1240  NA 132* 133* 132*  --   K 3.6 4.2 3.6  --   CL 96* 95*  95*  --   CO2 28 31 31   --   GLUCOSE 263* 346* 373* 420*  BUN 25* 21* 27*  --   CREATININE 1.05* 0.87 0.84  --   CALCIUM 8.6* 8.8* 8.8*  --    GFR: Estimated Creatinine Clearance: 56 mL/min (by C-G formula based on SCr of 0.84 mg/dL). Liver Function Tests: No results for input(s): AST, ALT, ALKPHOS, BILITOT, PROT, ALBUMIN in the last 168 hours. No results for input(s): LIPASE, AMYLASE in the last 168 hours. No results for input(s): AMMONIA in the last 168 hours. Coagulation Profile: No results for input(s): INR, PROTIME in the last 168 hours. Cardiac Enzymes:  Recent Labs Lab 03/02/16 1251 03/02/16 1541 03/02/16 1907 03/03/16 0055 03/03/16 0634  TROPONINI 0.04* 0.04* 0.03* 0.04* 0.03*   BNP (last 3 results) No results for input(s): PROBNP in the last 8760 hours. HbA1C:  Recent Labs  03/03/16 0639  HGBA1C 9.1*   CBG:  Recent Labs Lab 03/03/16 1141 03/03/16 1539 03/03/16 2033 03/04/16 0758 03/04/16 1214  GLUCAP 273* 250* 282* 368* 405*   Lipid Profile: No results for input(s): CHOL, HDL, LDLCALC, TRIG, CHOLHDL, LDLDIRECT in the last 72 hours. Thyroid Function Tests: No results for input(s): TSH, T4TOTAL, FREET4, T3FREE, THYROIDAB in the last 72 hours. Anemia Panel: No results for input(s): VITAMINB12, FOLATE, FERRITIN, TIBC, IRON, RETICCTPCT in the last 72 hours. Sepsis Labs:  Recent Labs Lab 03/02/16 1251 03/02/16 1541  LATICACIDVEN 1.5 1.4    Recent Results (from the past 240 hour(s))  MRSA PCR Screening     Status: None   Collection Time: 03/02/16  6:24 PM  Result Value Ref Range Status   MRSA by PCR NEGATIVE NEGATIVE Final    Comment:        The GeneXpert MRSA Assay (FDA approved for NASAL specimens only), is one component of a comprehensive MRSA colonization surveillance program. It is not intended to diagnose MRSA infection nor to guide or monitor treatment for MRSA infections.   Gram stain     Status: None   Collection Time: 03/03/16  12:05 PM  Result Value Ref Range Status   Specimen Description PLEURAL COLLECTED BY DOCTOR  Final   Special Requests BOTTLES DRAWN AEROBIC AND ANAEROBIC  Final   Gram Stain   Final    NO ORGANISMS SEEN WBC PRESENT,BOTH PMN AND MONONUCLEAR CYTOSPIN SMEAR    Report Status 03/03/2016 FINAL  Final  Culture, body fluid-bottle     Status: None (Preliminary result)   Collection Time: 03/03/16 12:05 PM  Result Value Ref Range Status   Specimen Description PLEURAL COLLECTED BY DOCTOR  Final   Special Requests BOTTLES DRAWN AEROBIC AND ANAEROBIC 10CC EACH  Final   Culture  NO GROWTH < 24 HOURS  Final   Report Status PENDING  Incomplete         Radiology Studies: US Venous Img Lower Unilateral Right  Result Date: 03/03/2016 CLINICAL DATA:  Right lower extremity swelling for 2 weeks with intermittent pain EXAM: RIGHT LOWER EXTREMITY VENOUS DOPPLER ULTRASOUND TECHNIQUE: Gray-scale sonography with graded compression, as well as color Doppler and duplex ultrasound were performed to evaluate the lower extremity deep venous systems from the level of the common femoral vein and including the common femoral, femoral, profunda femoral, popliteal and calf veins including the posterior tibial, peroneal and gastrocnemius veins when visible. The superficial great saphenous vein was also interrogated. Spectral Doppler was utilized to evaluate flow at rest and with distal augmentation maneuvers in the common femoral, femoral and popliteal veins. COMPARISON:  None. FINDINGS: Contralateral Common Femoral Vein: Respiratory phasicity is normal and symmetric with the symptomatic side. No evidence of thrombus. Normal compressibility. Common Femoral Vein: No evidence of thrombus. Normal compressibility, respiratory phasicity and response to augmentation. Saphenofemoral Junction: No evidence of thrombus. Normal compressibility and flow on color Doppler imaging. Profunda Femoral Vein: No evidence of thrombus. Normal  compressibility and flow on color Doppler imaging. Femoral Vein: No evidence of thrombus. Normal compressibility, respiratory phasicity and response to augmentation. Popliteal Vein: No evidence of thrombus. Normal compressibility, respiratory phasicity and response to augmentation. Calf Veins: No evidence of thrombus. Normal compressibility and flow on color Doppler imaging. Superficial Great Saphenous Vein: No evidence of thrombus. Normal compressibility and flow on color Doppler imaging. Venous Reflux:  None. Other Findings:  Subcutaneous calf and ankle ankle edema noted. IMPRESSION: No evidence of deep venous thrombosis. Electronically Signed   By: Judie Petit.  Shick M.D.   On: 03/03/2016 15:42   Dg Chest Port 1 View  Result Date: 03/03/2016 CLINICAL DATA:  Status post right thoracentesis today. EXAM: PORTABLE CHEST 1 VIEW COMPARISON:  CT chest and PA and lateral chest 03/02/2016. FINDINGS: Right pleural effusion is markedly decreased after thoracentesis. No pneumothorax. Small residual right pleural effusion and basilar atelectasis are noted. Mild atelectasis is present in the left lung base. There is cardiomegaly. Aortic atherosclerosis noted. IMPRESSION: Marked decrease right pleural effusion after thoracentesis. Negative for pneumothorax. Cardiomegaly without edema. Electronically Signed   By: Drusilla Kanner M.D.   On: 03/03/2016 12:27   US Thoracentesis Asp Pleural Space W/img Guide  Result Date: 03/03/2016 INDICATION: RIGHT pleural effusion EXAM: ULTRASOUND GUIDED DIAGNOSTIC AND THERAPEUTIC RIGHT THORACENTESIS MEDICATIONS: None. COMPLICATIONS: None immediate. PROCEDURE: Procedure, benefits, and risks of procedure were discussed with patient. Written informed consent for procedure was obtained. Time out protocol followed. Pleural effusion localized by ultrasound at the posterior RIGHT hemithorax. Skin prepped and draped in usual sterile fashion. Skin and soft tissues anesthetized with 10 mL of 1% lidocaine. 8  French thoracentesis catheter placed into the RIGHT pleural space. 1.5 L of clear yellow aspirated by syringe pump. Procedure tolerated well by patient without immediate complication. FINDINGS: A total of approximately 1.5 L of RIGHT pleural fluid was removed. Samples were sent to the laboratory as requested by the clinical team. IMPRESSION: Successful ultrasound guided RIGHT thoracentesis yielding 1.5 L of pleural fluid. Electronically Signed   By: Ulyses Southward M.D.   On: 03/03/2016 12:28        Scheduled Meds: . apixaban  5 mg Oral BID  . aspirin  81 mg Oral Daily  . feeding supplement (GLUCERNA SHAKE)  237 mL Oral TID BM  . furosemide  40 mg  Oral Daily  . insulin aspart  0-20 Units Subcutaneous TID WC  . insulin aspart  0-5 Units Subcutaneous QHS  . insulin aspart  5 Units Subcutaneous TID WC  . insulin glargine  20 Units Subcutaneous BID  . ipratropium-albuterol  3 mL Nebulization Q6H  . levofloxacin  500 mg Oral Daily  . levothyroxine  100 mcg Oral QAC breakfast  . mouth rinse  15 mL Mouth Rinse BID  . metoprolol tartrate  25 mg Oral Daily  . pantoprazole  80 mg Oral Daily  . potassium chloride SA  20 mEq Oral Daily  . [START ON 03/05/2016] predniSONE  40 mg Oral QAC breakfast  . rosuvastatin  10 mg Oral QHS  . spironolactone  12.5 mg Oral Daily   Continuous Infusions: . diltiazem (CARDIZEM) infusion 15 mg/hr (03/04/16 1224)     LOS: 2 days    Time spent: 35 minutes.     Kathlen Mody, MD [facility] Pager [phone]   If 7PM-7AM, please contact night-coverage [email or web address] Password [email or web address] 03/04/2016, 2:51 PM

## 2016-03-04 NOTE — Progress Notes (Signed)
Inpatient Diabetes Program Recommendations  AACE/ADA: New Consensus Statement on Inpatient Glycemic Control (2015)  Target Ranges:  Prepandial:   less than 140 mg/dL      Peak postprandial:   less than 180 mg/dL (1-2 hours)      Critically ill patients:  140 - 180 mg/dL   Results for [name], [name] (MRN [id number]) as of 03/04/2016 08:36  Ref. Range 03/03/2016 07:15 03/03/2016 11:41 03/03/2016 15:39 03/03/2016 20:33 03/04/2016 07:58  Glucose-Capillary Latest Ref Range: 65 - 99 mg/dL 409320 (H) 811273 (H) 914250 (H) 282 (H) 368 (H)   Review of Glycemic Control  Diabetes history: DM2 Outpatient Diabetes medications: Lantus 15 units BID, Glipizide 10 mg BID Current orders for Inpatient glycemic control: Lantus 20 units BID, Novolog 0-20 units TID with meals, Novolog 0-5 units QHS, Novolog 3 units TID with meals for meal coverage  Inpatient Diabetes Program Recommendations: Insulin - Basal: If steroids are continued as ordered, please consider increasing Lantus to 25 units BID. Insulin - Meal Coverage: If steroids are continued as ordered, please consider increasing meal coverage to Novolog 7 units TID with meals for meal coverage.  Thanks, Orlando PennerMarie Daly Whipkey, RN, MSN, CDE Diabetes Coordinator Inpatient Diabetes Program [phone] (Team Pager from 8am to 5pm)

## 2016-03-04 NOTE — Evaluation (Signed)
Physical Therapy Evaluation Patient Details Name: [name] [name] MRN: [id number] DOB: 11/18/1940 Today's Date: 03/04/2016   History of Present Illness  76 y.o. female with medical history significant of DM, hypothyroidism, HLD, HTN, COPD on home O2, and afib presenting because "I couldn't breathe."  Has had several spells in the last week. Laid down but had to get up today because it was getting worse and worse.  EMS checked O2 sats and it was 90%.  +cough - non productive.  After arrival in the ER, was able to get up 2 good chunks of mucus that were medium color.  Dx: Afib with RVR, pleural effusion s/p US guided R thoracentesis on 03/03/2016, HCAP, and COPD exacerbation  Clinical Impression  Pt received in bed, and was agreeable to PT evaluation.  Pt lives with her son, and is currently receiving assistance from a home health nurse, and a home health physical therapist.  She uses a RW for ambulation, and has required assistance for dressing and bathing.  During PT evaluation she requires Min A for bed mobility and transfer sit<>stand.  She used a RW for ambulation x 4950ft with Min guard.  Further gait distance was limited due to fatigue with HR ranging from 97bpm to 141bpm.  When finished, pt expressed her rate of perceived exertion on the modified BORG was 10/10.  It is noted that she has had 5 admissions in the past 6 months.  Due to her continued need for assistance with daily functional activities and decreased endurance, she is recommended for SNF.  Pt expresses that her desire is to go home, but she will discuss SNF with her son and her home health nurse.  If she decides to go home, she would benefit from continuation of HHPT.     Follow Up Recommendations SNF    Equipment Recommendations  None recommended by PT    Recommendations for Other Services       Precautions / Restrictions Precautions Precautions: Fall Precaution Comments: due to immobility and recent hospitalizations.   Restrictions Weight Bearing Restrictions: No      Mobility  Bed Mobility Overal bed mobility: Needs Assistance Bed Mobility: Supine to Sit     Supine to sit: Min assist     General bed mobility comments: HOB raised, and increased time.   Transfers Overall transfer level: Needs assistance Equipment used: Rolling walker (2 wheeled) Transfers: Sit to/from Stand Sit to Stand: Min assist            Ambulation/Gait Ambulation/Gait assistance: Min guard Ambulation Distance (Feet): 50 Feet Assistive device: Rolling walker (2 wheeled) Gait Pattern/deviations: Step-to pattern   Gait velocity interpretation: <1.8 ft/sec, indicative of risk for recurrent falls General Gait Details: Gait distance limited due to fatigue, as well as elevated HR up to 141bpm.  Pt expressed that her rate of perceived exertion on the modified BORG was 10/10 after ambulation.    Stairs            Wheelchair Mobility    Modified Rankin (Stroke Patients Only)       Balance Overall balance assessment: Needs assistance Sitting-balance support: Feet unsupported;Bilateral upper extremity supported Sitting balance-Leahy Scale: Fair     Standing balance support: Bilateral upper extremity supported Standing balance-Leahy Scale: Poor                               Pertinent Vitals/Pain Pain Assessment: No/denies pain    Home  Living   Living Arrangements: Children (son is at home most of the time.  ) Available Help at Discharge: Home health (RN and PT) Type of Home: House Home Access: Stairs to enter   [employer] of Steps: 1 Home Layout: One level Home Equipment: Walker - 2 wheels;Cane - single point;Shower seat (O2 at home 2-3L)      Prior Function Level of Independence: Independent with assistive device(s)   Gait / Transfers Assistance Needed: Uses RW for ambulation - states she can get from one end of the house to the other, and was doing her exercises.     ADL's / Homemaking Assistance Needed: Assistance for dressing and bathing.  Has not driven since last admission.         Hand Dominance   Dominant Hand: Right    Extremity/Trunk Assessment   Upper Extremity Assessment Upper Extremity Assessment: Generalized weakness    Lower Extremity Assessment Lower Extremity Assessment: Generalized weakness       Communication   Communication: No difficulties  Cognition Arousal/Alertness: Awake/alert Behavior During Therapy: WFL for tasks assessed/performed Overall Cognitive Status: Within Functional Limits for tasks assessed                      General Comments      Exercises     Assessment/Plan    PT Assessment Patient needs continued PT services  PT Problem List Decreased strength;Decreased activity tolerance;Decreased balance;Decreased mobility;Cardiopulmonary status limiting activity          PT Treatment Interventions Gait training;Functional mobility training;Therapeutic activities;Therapeutic exercise;Balance training;Patient/family education    PT Goals (Current goals can be found in the Care Plan section)  Acute Rehab PT Goals Patient Stated Goal: Pt expressed that she would like to go home.  PT Goal Formulation: With patient Time For Goal Achievement: 03/11/16 Potential to Achieve Goals: Fair    Frequency Min 3X/week   Barriers to discharge        Co-evaluation               End of Session Equipment Utilized During Treatment: Gait belt;Oxygen Activity Tolerance: Patient limited by fatigue Patient left: in chair;with call bell/phone within reach Nurse Communication: Mobility status (mobility sheet hunt in patient's room. )    Functional Assessment Tool Used: [employer] AM-PAC "6-clicks"  Functional Limitation: Mobility: Walking and moving around Mobility: Walking and Moving Around Current Status [phone]): At least 40 percent but less than 60 percent impaired, limited or  restricted Mobility: Walking and Moving Around Goal Status [phone]): At least 20 percent but less than 40 percent impaired, limited or restricted    Time: 0933-1001 PT Time Calculation (min) (ACUTE ONLY): 28 min   Charges:   PT Evaluation $PT Eval Moderate Complexity: 1 Procedure PT Treatments $Gait Training: 8-22 mins   PT G Codes:   PT G-Codes **NOT FOR INPATIENT CLASS** Functional Assessment Tool Used: [employer] "6-clicks"  Functional Limitation: Mobility: Walking and moving around Mobility: Walking and Moving Around Current Status [phone]): At least 40 percent but less than 60 percent impaired, limited or restricted Mobility: Walking and Moving Around Goal Status [phone]): At least 20 percent but less than 40 percent impaired, limited or restricted    Beth [place] Straus, PT, DPT X: [phone]

## 2016-03-04 NOTE — Care Management Important Message (Signed)
Important Message  Patient Details  Name: [name] [name] MRN: [id number] Date of Birth: 05/20/1940   Medicare Important Message Given:  Yes    Shashana Fullington, Chrystine OilerSharley Diane, [name] [name] 03/04/2016, 4:28 PM

## 2016-03-04 NOTE — Care Management Note (Signed)
Case Management Note  Patient Details  Name: [name] [name] MRN: [id number] Date of Birth: 01/15/1941  Subjective/Objective:  Patient adm from home with Afib with RVR. She lives with her son, has walker PTA. [facility] PT has recommended SNF. Patient at this time is unsure if she would agree to SNF.                Action/Plan: CM will order resumption of home health in case patient does elect to go home. CSW aware of SNF referral.    Expected Discharge Date:       03/07/2016          Expected Discharge Plan:  Home w Home Health Services  In-House Referral:  NA  Discharge planning Services  CM Consult  Post Acute Care Choice:  Home Health, Resumption of Svcs/PTA Provider Choice offered to:     DME Arranged:    DME Agency:     HH Arranged:    HH Agency:  [facility]  Status of Service:  In process, will continue to follow  If discussed at Long Length of Stay Meetings, dates discussed:    Additional Comments:  [name] [name], [name] OilerSharley Diane, RN 03/04/2016, 4:23 PM

## 2016-03-04 NOTE — Evaluation (Signed)
Occupational Therapy Evaluation Patient Details Name: Jeronimo NormaShirley O Lawhead MRN: [id number] DOB: 08/13/1940 Today's Date: 03/04/2016    History of Present Illness 76 y.o. female with medical history significant of DM, hypothyroidism, HLD, HTN, COPD on home O2, and afib presenting because "I couldn't breathe."  Has had several spells in the last week. Laid down but had to get up today because it was getting worse and worse.  EMS checked O2 sats and it was 90%.  +cough - non productive.  After arrival in the ER, was able to get up 2 good chunks of mucus that were medium color.  Dx: Afib with RVR, pleural effusion s/p US guided R thoracentesis on 03/03/2016, HCAP, and COPD exacerbation   Clinical Impression   Pt awake, alert, oriented x4 this afternoon, agreeable to OT evaluation. Pt has required increased levels of assistance with ADL completion since previous admission, has a friend who comes to assist with bathing and dressing tasks. Discussed HHOT versus SNF, pt is contemplating SNF however would prefer to return home, stating "my nurse and therapist will help me." Educated pt on [facility]. Pt would benefit from SNF to improve safety and independence in B/IADL completion and as well as improve strength and activity tolerance. If pt refuses SNF recommend HHOT services on discharge.     Follow Up Recommendations  SNF;Home health OT    Equipment Recommendations  Tub/shower seat       Precautions / Restrictions Precautions Precautions: Fall Precaution Comments: due to immobility and recent hospitalizations.  Restrictions Weight Bearing Restrictions: No      Mobility Bed Mobility Overal bed mobility: Needs Assistance Bed Mobility: Supine to Sit     Supine to sit: Min assist     General bed mobility comments: HOB raised  Transfers         General transfer comment: not tested         ADL Overall ADL's : Needs  assistance/impaired     Grooming: Set up;Sitting   Upper Body Bathing: Maximal assistance;Sitting (per pt report)   Lower Body Bathing: Maximal assistance;Sitting/lateral leans (per pt report)   Upper Body Dressing : Minimal assistance;Sitting (per pt report)                     General ADL Comments: Pt reports she requires greater assistance now than before previous admission. Friend comes to assist with dressing and bathing, son assists with meal preparation and household chores     Vision Vision Assessment?: No apparent visual deficits          Pertinent Vitals/Pain Pain Assessment: No/denies pain     Hand Dominance Right   Extremity/Trunk Assessment Upper Extremity Assessment Upper Extremity Assessment: Generalized weakness   Lower Extremity Assessment Lower Extremity Assessment: Defer to PT evaluation       Communication Communication Communication: No difficulties   Cognition Arousal/Alertness: Awake/alert Behavior During Therapy: WFL for tasks assessed/performed Overall Cognitive Status: Within Functional Limits for tasks assessed                                Home Living   Living Arrangements: Children (son-with pt majority of time) Available Help at Discharge: Home health (RN/PT) Type of Home: House Home Access: Stairs to enter Entergy CorporationEntrance Stairs-Number of Steps: 1 Entrance Stairs-Rails: None Home Layout: One level     Bathroom Shower/Tub: Tub/shower unit (  uses BSC to sit on in shower)   Bathroom Toilet: Standard     Home Equipment: Environmental consultant - 2 wheels;Cane - single point;Shower seat (O2, 2-3L)          Prior Functioning/Environment Level of Independence: Independent with assistive device(s)  Gait / Transfers Assistance Needed: Uses RW for ambulation - states she can get from one end of the house to the other, and was doing her exercises.   ADL's / Homemaking Assistance Needed: Has personal care assistant (family friend) who  assists with bathing and dressing. Pt only wears gown and slippers            OT Problem List: Decreased strength;Decreased activity tolerance;Impaired balance (sitting and/or standing);Decreased safety awareness   OT Treatment/Interventions:      OT Goals(Current goals can be found in the care plan section) Acute Rehab OT Goals Patient Stated Goal: Pt expressed that she would like to go home.    End of Session    Activity Tolerance: Patient tolerated treatment well Patient left: in bed;with call bell/phone within reach   Time: 1318-1331 OT Time Calculation (min): 13 min Charges:  OT General Charges $OT Visit: 1 Procedure OT Evaluation $OT Eval Low Complexity: 1 Procedure Ezra Sites, OTR/L  [phone]  03/04/2016, 1:39 PM

## 2016-03-05 LAB — GLUCOSE, CAPILLARY
GLUCOSE-CAPILLARY: 212 mg/dL — AB (ref 65–99)
GLUCOSE-CAPILLARY: 280 mg/dL — AB (ref 65–99)
Glucose-Capillary: 311 mg/dL — ABNORMAL HIGH (ref 65–99)
Glucose-Capillary: 217 mg/dL — ABNORMAL HIGH (ref 65–99)

## 2016-03-05 MED ORDER — POLYETHYLENE GLYCOL 3350 17 G PO PACK
17.0000 g | PACK | Freq: Every day | ORAL | Status: DC
  Administered 2016-03-08: 17 g via ORAL
  Filled 2016-03-05 (×2): qty 1

## 2016-03-05 MED ORDER — METOPROLOL TARTRATE 25 MG PO TABS
25.0000 mg | ORAL_TABLET | Freq: Two times a day (BID) | ORAL | Status: DC
  Administered 2016-03-05 – 2016-03-07 (×4): 25 mg via ORAL
  Filled 2016-03-05 (×4): qty 1

## 2016-03-05 MED ORDER — SENNOSIDES-DOCUSATE SODIUM 8.6-50 MG PO TABS
2.0000 | ORAL_TABLET | Freq: Two times a day (BID) | ORAL | Status: DC
  Administered 2016-03-05 – 2016-03-06 (×3): 2 via ORAL
  Filled 2016-03-05 (×5): qty 2

## 2016-03-05 NOTE — Progress Notes (Signed)
PROGRESS NOTE    [name] [name]  ZOX:[id number] DOB: 05-10-1940 DOA: 03/02/2016 PCP: Ignatius Specking, MD    Brief Narrative: [name] [name] is a 76 y.o. female with medical history significant of DM, hypothyroidism, HLD, HTN, COPD on home O2, and afib presents with difficulty  Breathing, and non productive cough, associated with afib with RVR.  She was admitted to step down, started on cardizem gtt and pulmonary consulted.   Assessment & Plan:   Principal Problem:   Atrial fibrillation with rapid ventricular response (HCC) Active Problems:   Chronic anticoagulation-CHADs VASc=6   Acute on chronic respiratory failure with hypoxia (HCC)   Uncontrolled type 2 diabetes mellitus with complication (HCC)   COPD (chronic obstructive pulmonary disease) (HCC)   Pressure injury of skin   Pleural effusion on right   Edema of right lower extremity   Acute on chronic respiratory failure with hypoxia; secondary to COPD exacerbation and right large pleural effusion.  Admitted to step down, started on IV steroids, duonebs, levaquin.  IR consulted for thoracentesis and the pleural fluid  About 1.5 lit of yellow serous fluid drained and sent for analysis. Pleural fluid protein is less than 3 g/dl, and gram stain is negative. Awaiting culture report. Would transition to po steroids as she is not wheezing.   Resume prednisone.     Atrial fibrillation with RVR:  On Cardizem gtt for rate control. Resume po lopressor. Unable to wean her off cardizem.  Resume eliquis for anti coagulation.  Probably brought on by increased work of breathing and duonebs.  Still remains ont he gtt.   Uncontrolled DM: hgba1c 9.1 not well controlled.  SSI .  CBG (last 3)   Recent Labs  03/05/16 0728 03/05/16 1113 03/05/16 1657  GLUCAP 212* 311* 217*    Increased lantus to 25 units BID, added novolog 5 units tidac, and increased SSI to resistant scale.     Chronic diastolic heart failure/ CAD S/P PCI:  Resume  lasix, spironolactone, aspirin and statin.   Hypothyroidism: resume synthroid.   Constipation: resume stool softeners.   Right leg swelling: venous duplex negative for DVT.   DVT prophylaxis: Eliquis.  Code Status: full code.  Family Communication: none at bedside.  Disposition Plan: pending further eval.    Consultants:   Pulmonary consult.    Procedures: thoracentesis.    Antimicrobials: levaquin   Subjective: Feels better currently on 3.5 lit of [place] oxygen. No new complaints.   Objective: Vitals:   03/05/16 1644 03/05/16 1800 03/05/16 1830 03/05/16 1900  BP:  126/75 124/77 (!) 120/56  Pulse: (!) 103 (!) 110 (!) 106 99  Resp: (!) 30 (!) 30 (!) 29 (!) 26  Temp: 98.1 F (36.7 C)     TempSrc: Oral     SpO2: 97% 92% 95% 96%  Weight:      Height:        Intake/Output Summary (Last 24 hours) at 03/05/16 1921 Last data filed at 03/05/16 1840  Gross per 24 hour  Intake           1400.5 ml  Output             2750 ml  Net          -1349.5 ml   Filed Weights   03/03/16 0449 03/04/16 0500 03/05/16 0400  Weight: 71.8 kg (158 lb 4.6 oz) 72.5 kg (159 lb 13.3 oz) 70.4 kg (155 lb 3.3 oz)    Examination:  General exam: comfortable  sitting in the chair.  Respiratory system: air entry fair bilateral.  Cardiovascular system: S1 & S2 heard, irregular. No JVD, murmurs, rubs, gallops or clicks. No pedal edema. Gastrointestinal system: Abdomen is nondistended, soft and nontender. No organomegaly or masses felt. Normal bowel sounds heard. Central nervous system: Alert and oriented. No focal neurological deficits. Extremities: Symmetric 5 x 5 power. Skin: No rashes, lesions or ulcers Psychiatry: Judgement and insight appear normal. Mood & affect appropriate.     Data Reviewed: I have personally reviewed following labs and imaging studies  CBC:  Recent Labs Lab 03/02/16 1251 03/03/16 0629 03/04/16 0443  WBC 15.9* 9.4 16.7*  HGB 11.9* 11.3* 11.5*  HCT 38.8 36.6 37.6   MCV 95.6 95.8 95.4  PLT 371 247 240   Basic Metabolic Panel:  Recent Labs Lab 03/02/16 1251 03/03/16 0629 03/04/16 0443 03/04/16 1240  NA 132* 133* 132*  --   K 3.6 4.2 3.6  --   CL 96* 95* 95*  --   CO2 28 31 31   --   GLUCOSE 263* 346* 373* 420*  BUN 25* 21* 27*  --   CREATININE 1.05* 0.87 0.84  --   CALCIUM 8.6* 8.8* 8.8*  --    GFR: Estimated Creatinine Clearance: 55.3 mL/min (by C-G formula based on SCr of 0.84 mg/dL). Liver Function Tests: No results for input(s): AST, ALT, ALKPHOS, BILITOT, PROT, ALBUMIN in the last 168 hours. No results for input(s): LIPASE, AMYLASE in the last 168 hours. No results for input(s): AMMONIA in the last 168 hours. Coagulation Profile: No results for input(s): INR, PROTIME in the last 168 hours. Cardiac Enzymes:  Recent Labs Lab 03/02/16 1251 03/02/16 1541 03/02/16 1907 03/03/16 0055 03/03/16 0634  TROPONINI 0.04* 0.04* 0.03* 0.04* 0.03*   BNP (last 3 results) No results for input(s): PROBNP in the last 8760 hours. HbA1C:  Recent Labs  03/03/16 0639  HGBA1C 9.1*   CBG:  Recent Labs Lab 03/04/16 1624 03/04/16 2146 03/05/16 0728 03/05/16 1113 03/05/16 1657  GLUCAP 340* 234* 212* 311* 217*   Lipid Profile: No results for input(s): CHOL, HDL, LDLCALC, TRIG, CHOLHDL, LDLDIRECT in the last 72 hours. Thyroid Function Tests: No results for input(s): TSH, T4TOTAL, FREET4, T3FREE, THYROIDAB in the last 72 hours. Anemia Panel: No results for input(s): VITAMINB12, FOLATE, FERRITIN, TIBC, IRON, RETICCTPCT in the last 72 hours. Sepsis Labs:  Recent Labs Lab 03/02/16 1251 03/02/16 1541  LATICACIDVEN 1.5 1.4    Recent Results (from the past 240 hour(s))  MRSA PCR Screening     Status: None   Collection Time: 03/02/16  6:24 PM  Result Value Ref Range Status   MRSA by PCR NEGATIVE NEGATIVE Final    Comment:        The GeneXpert MRSA Assay (FDA approved for NASAL specimens only), is one component of a comprehensive  MRSA colonization surveillance program. It is not intended to diagnose MRSA infection nor to guide or monitor treatment for MRSA infections.   Gram stain     Status: None   Collection Time: 03/03/16 12:05 PM  Result Value Ref Range Status   Specimen Description PLEURAL COLLECTED BY DOCTOR  Final   Special Requests BOTTLES DRAWN AEROBIC AND ANAEROBIC  Final   Gram Stain   Final    NO ORGANISMS SEEN WBC PRESENT,BOTH PMN AND MONONUCLEAR CYTOSPIN SMEAR    Report Status 03/03/2016 FINAL  Final  Culture, body fluid-bottle     Status: None (Preliminary result)   Collection Time: 03/03/16  12:05 PM  Result Value Ref Range Status   Specimen Description PLEURAL COLLECTED BY DOCTOR  Final   Special Requests BOTTLES DRAWN AEROBIC AND ANAEROBIC 10CC EACH  Final   Culture NO GROWTH < 24 HOURS  Final   Report Status PENDING  Incomplete         Radiology Studies: No results found.      Scheduled Meds: . apixaban  5 mg Oral BID  . aspirin  81 mg Oral Daily  . feeding supplement (GLUCERNA SHAKE)  237 mL Oral TID BM  . furosemide  40 mg Oral Daily  . insulin aspart  0-20 Units Subcutaneous TID WC  . insulin aspart  0-5 Units Subcutaneous QHS  . insulin aspart  5 Units Subcutaneous TID WC  . insulin glargine  25 Units Subcutaneous BID  . ipratropium-albuterol  3 mL Nebulization Q6H  . levofloxacin  500 mg Oral Daily  . levothyroxine  100 mcg Oral QAC breakfast  . mouth rinse  15 mL Mouth Rinse BID  . metoprolol tartrate  25 mg Oral BID  . pantoprazole  80 mg Oral Daily  . polyethylene glycol  17 g Oral Daily  . potassium chloride SA  20 mEq Oral Daily  . predniSONE  40 mg Oral QAC breakfast  . rosuvastatin  10 mg Oral QHS  . senna-docusate  2 tablet Oral BID  . spironolactone  12.5 mg Oral Daily   Continuous Infusions: . diltiazem (CARDIZEM) infusion 7.5 mg/hr (03/05/16 1313)     LOS: 3 days    Time spent: 35 minutes.     Kathlen ModyAKULA,Appolonia Ackert, MD [facility] Pager  [phone]   If 7PM-7AM, please contact night-coverage [email or web address] Password [email or web address] 03/05/2016, 7:21 PM

## 2016-03-06 DIAGNOSIS — Z794 Long term (current) use of insulin: Secondary | ICD-10-CM

## 2016-03-06 DIAGNOSIS — J90 Pleural effusion, not elsewhere classified: Principal | ICD-10-CM

## 2016-03-06 LAB — GLUCOSE, CAPILLARY
Glucose-Capillary: 259 mg/dL — ABNORMAL HIGH (ref 65–99)
Glucose-Capillary: 308 mg/dL — ABNORMAL HIGH (ref 65–99)
Glucose-Capillary: 290 mg/dL — ABNORMAL HIGH (ref 65–99)
Glucose-Capillary: 262 mg/dL — ABNORMAL HIGH (ref 65–99)

## 2016-03-06 MED ORDER — IPRATROPIUM-ALBUTEROL 0.5-2.5 (3) MG/3ML IN SOLN
3.0000 mL | Freq: Four times a day (QID) | RESPIRATORY_TRACT | Status: DC
  Administered 2016-03-06 – 2016-03-08 (×8): 3 mL via RESPIRATORY_TRACT
  Filled 2016-03-06 (×8): qty 3

## 2016-03-06 MED ORDER — BISACODYL 10 MG RE SUPP
10.0000 mg | Freq: Every day | RECTAL | Status: DC | PRN
Start: 2016-03-06 — End: 2016-03-08

## 2016-03-06 MED ORDER — DILTIAZEM HCL 60 MG PO TABS
90.0000 mg | ORAL_TABLET | Freq: Four times a day (QID) | ORAL | Status: DC
  Administered 2016-03-06 – 2016-03-08 (×8): 90 mg via ORAL
  Filled 2016-03-06 (×8): qty 1

## 2016-03-06 MED ORDER — INSULIN GLARGINE 100 UNIT/ML ~~LOC~~ SOLN
30.0000 [IU] | Freq: Two times a day (BID) | SUBCUTANEOUS | Status: DC
  Administered 2016-03-06 – 2016-03-07 (×2): 30 [IU] via SUBCUTANEOUS
  Filled 2016-03-06 (×4): qty 0.3

## 2016-03-06 MED ORDER — FLEET ENEMA 7-19 GM/118ML RE ENEM: 1.0000 | ENEMA | Freq: Every day | RECTAL | Status: DC | PRN

## 2016-03-06 MED ORDER — OFF THE BEAT BOOK
Freq: Once | Status: AC
  Administered 2016-03-06: 11:00:00
  Filled 2016-03-06: qty 1

## 2016-03-06 NOTE — Progress Notes (Signed)
PROGRESS NOTE    [name] [name]  ZOX:[id number] DOB: 03/27/1940 DOA: 03/02/2016 PCP: Ignatius Specking, MD    Brief Narrative: [name] [name] is a 76 y.o. female with medical history significant of DM, hypothyroidism, HLD, HTN, COPD on home O2, and afib presents with difficulty  Breathing, and non productive cough, associated with afib with RVR.  She was admitted to step down, started on cardizem gtt and pulmonary consulted.   Assessment & Plan:   Principal Problem:   Atrial fibrillation with rapid ventricular response (HCC) Active Problems:   Chronic anticoagulation-CHADs VASc=6   Acute on chronic respiratory failure with hypoxia (HCC)   Uncontrolled type 2 diabetes mellitus with complication (HCC)   COPD (chronic obstructive pulmonary disease) (HCC)   Pressure injury of skin   Pleural effusion on right   Edema of right lower extremity   Acute on chronic respiratory failure with hypoxia; secondary to COPD exacerbation and right large pleural effusion.  Admitted to step down, started on IV steroids, duonebs, levaquin.  IR consulted for thoracentesis and the pleural fluid  About 1.5 lit of yellow serous fluid drained and sent for analysis. Pleural fluid protein is less than 3 g/dl, and gram stain is negative. Pleural fluid cultures have been negative so far. Transitioned  to po steroids as she is not wheezing.  Resume duonebs as needed.  Resume Levaquin to complete the course.  Repeat CXR to evaluate for recurrence of pleural fluid.     Atrial fibrillation with RVR:  She was started on Cardizem gtt for rate control. Resume po lopressor. Change to 90 mg evvery 6 hours and lopressor.   Resume eliquis for anti coagulation.  Probably brought on by increased work of breathing and duonebs.    Uncontrolled DM: hgba1c 9.1 not well controlled.  SSI .  CBG (last 3)   Recent Labs  03/05/16 2102 03/06/16 0730 03/06/16 1109  GLUCAP 280* 259* 308*    Increase lantus to 30 units BID,  added novolog 5 units tidac, and increased SSI to resistant scale.     Chronic diastolic heart failure/ CAD S/P PCI:  Resume lasix, spironolactone, aspirin and statin.   Hypothyroidism: resume synthroid.   Constipation: resume stool softeners. Added dulcolax suppository and fleet enema as needed.   Right leg swelling: venous duplex negative for DVT.   DVT prophylaxis: Eliquis.  Code Status: full code.  Family Communication: none at bedside.  Disposition Plan: possible transfer to telemetry tonight if her rate is controlled.    Consultants:   Pulmonary consult.    Procedures: thoracentesis.    Antimicrobials: levaquin   Subjective: Feels better currently on 3.5 lit of [place] oxygen. No new complaints.   Objective: Vitals:   03/06/16 1000 03/06/16 1100 03/06/16 1110 03/06/16 1149  BP: 124/77 125/66    Pulse: (!) 116 100 (!) 101   Resp: (!) 33 (!) 21 (!) 33   Temp:   97.7 F (36.5 C)   TempSrc:   Oral   SpO2: 97% 93% 93% 96%  Weight:      Height:        Intake/Output Summary (Last 24 hours) at 03/06/16 1324 Last data filed at 03/06/16 0844  Gross per 24 hour  Intake             1080 ml  Output             4100 ml  Net            -3020  ml   Filed Weights   03/04/16 0500 03/05/16 0400 03/06/16 0447  Weight: 72.5 kg (159 lb 13.3 oz) 70.4 kg (155 lb 3.3 oz) 71.8 kg (158 lb 4.6 oz)    Examination:  General exam: comfortable sitting in the chair.  Respiratory system: air entry fair bilateral.  Cardiovascular system: S1 & S2 heard, irregular. No JVD, murmurs, rubs, gallops or clicks. No pedal edema. Gastrointestinal system: Abdomen is nondistended, soft and nontender. No organomegaly or masses felt. Normal bowel sounds heard. Central nervous system: Alert and oriented. No focal neurological deficits. Extremities: Symmetric 5 x 5 power. Skin: No rashes, lesions or ulcers Psychiatry: Judgement and insight appear normal. Mood & affect appropriate.     Data  Reviewed: I have personally reviewed following labs and imaging studies  CBC:  Recent Labs Lab 03/02/16 1251 03/03/16 0629 03/04/16 0443  WBC 15.9* 9.4 16.7*  HGB 11.9* 11.3* 11.5*  HCT 38.8 36.6 37.6  MCV 95.6 95.8 95.4  PLT 371 247 240   Basic Metabolic Panel:  Recent Labs Lab 03/02/16 1251 03/03/16 0629 03/04/16 0443 03/04/16 1240  NA 132* 133* 132*  --   K 3.6 4.2 3.6  --   CL 96* 95* 95*  --   CO2 28 31 31   --   GLUCOSE 263* 346* 373* 420*  BUN 25* 21* 27*  --   CREATININE 1.05* 0.87 0.84  --   CALCIUM 8.6* 8.8* 8.8*  --    GFR: Estimated Creatinine Clearance: 55.8 mL/min (by C-G formula based on SCr of 0.84 mg/dL). Liver Function Tests: No results for input(s): AST, ALT, ALKPHOS, BILITOT, PROT, ALBUMIN in the last 168 hours. No results for input(s): LIPASE, AMYLASE in the last 168 hours. No results for input(s): AMMONIA in the last 168 hours. Coagulation Profile: No results for input(s): INR, PROTIME in the last 168 hours. Cardiac Enzymes:  Recent Labs Lab 03/02/16 1251 03/02/16 1541 03/02/16 1907 03/03/16 0055 03/03/16 0634  TROPONINI 0.04* 0.04* 0.03* 0.04* 0.03*   BNP (last 3 results) No results for input(s): PROBNP in the last 8760 hours. HbA1C: No results for input(s): HGBA1C in the last 72 hours. CBG:  Recent Labs Lab 03/05/16 1113 03/05/16 1657 03/05/16 2102 03/06/16 0730 03/06/16 1109  GLUCAP 311* 217* 280* 259* 308*   Lipid Profile: No results for input(s): CHOL, HDL, LDLCALC, TRIG, CHOLHDL, LDLDIRECT in the last 72 hours. Thyroid Function Tests: No results for input(s): TSH, T4TOTAL, FREET4, T3FREE, THYROIDAB in the last 72 hours. Anemia Panel: No results for input(s): VITAMINB12, FOLATE, FERRITIN, TIBC, IRON, RETICCTPCT in the last 72 hours. Sepsis Labs:  Recent Labs Lab 03/02/16 1251 03/02/16 1541  LATICACIDVEN 1.5 1.4    Recent Results (from the past 240 hour(s))  MRSA PCR Screening     Status: None   Collection  Time: 03/02/16  6:24 PM  Result Value Ref Range Status   MRSA by PCR NEGATIVE NEGATIVE Final    Comment:        The GeneXpert MRSA Assay (FDA approved for NASAL specimens only), is one component of a comprehensive MRSA colonization surveillance program. It is not intended to diagnose MRSA infection nor to guide or monitor treatment for MRSA infections.   Gram stain     Status: None   Collection Time: 03/03/16 12:05 PM  Result Value Ref Range Status   Specimen Description PLEURAL COLLECTED BY DOCTOR  Final   Special Requests BOTTLES DRAWN AEROBIC AND ANAEROBIC  Final   Gram Stain  Final    NO ORGANISMS SEEN WBC PRESENT,BOTH PMN AND MONONUCLEAR CYTOSPIN SMEAR    Report Status 03/03/2016 FINAL  Final  Culture, body fluid-bottle     Status: None (Preliminary result)   Collection Time: 03/03/16 12:05 PM  Result Value Ref Range Status   Specimen Description PLEURAL COLLECTED BY DOCTOR  Final   Special Requests BOTTLES DRAWN AEROBIC AND ANAEROBIC 10CC EACH  Final   Culture NO GROWTH 3 DAYS  Final   Report Status PENDING  Incomplete         Radiology Studies: No results found.      Scheduled Meds: . apixaban  5 mg Oral BID  . aspirin  81 mg Oral Daily  . feeding supplement (GLUCERNA SHAKE)  237 mL Oral TID BM  . furosemide  40 mg Oral Daily  . insulin aspart  0-20 Units Subcutaneous TID WC  . insulin aspart  0-5 Units Subcutaneous QHS  . insulin aspart  5 Units Subcutaneous TID WC  . insulin glargine  25 Units Subcutaneous BID  . ipratropium-albuterol  3 mL Nebulization Q6H WA  . levofloxacin  500 mg Oral Daily  . levothyroxine  100 mcg Oral QAC breakfast  . mouth rinse  15 mL Mouth Rinse BID  . metoprolol tartrate  25 mg Oral BID  . pantoprazole  80 mg Oral Daily  . polyethylene glycol  17 g Oral Daily  . potassium chloride SA  20 mEq Oral Daily  . predniSONE  40 mg Oral QAC breakfast  . rosuvastatin  10 mg Oral QHS  . senna-docusate  2 tablet Oral BID  .  spironolactone  12.5 mg Oral Daily   Continuous Infusions: . diltiazem (CARDIZEM) infusion 10 mg/hr (03/06/16 0647)     LOS: 4 days    Time spent: 35 minutes.     Kathlen Mody, MD [facility] Pager [phone]   If 7PM-7AM, please contact night-coverage [email or web address] Password [email or web address] 03/06/2016, 1:24 PM

## 2016-03-07 ENCOUNTER — Inpatient Hospital Stay (HOSPITAL_COMMUNITY): Payer: Medicare Other

## 2016-03-07 LAB — BASIC METABOLIC PANEL
ANION GAP: 8 (ref 5–15)
BUN: 35 mg/dL — ABNORMAL HIGH (ref 6–20)
CALCIUM: 9.1 mg/dL (ref 8.9–10.3)
CO2: 36 mmol/L — AB (ref 22–32)
Chloride: 93 mmol/L — ABNORMAL LOW (ref 101–111)
Creatinine, Ser: 0.7 mg/dL (ref 0.44–1.00)
GFR calc non Af Amer: >60 mL/min (ref >60)
Glucose, Bld: 87 mg/dL (ref 65–99)
Potassium: 4 mmol/L (ref 3.5–5.1)
Sodium: 137 mmol/L (ref 135–145)

## 2016-03-07 LAB — GLUCOSE, CAPILLARY
GLUCOSE-CAPILLARY: 117 mg/dL — AB (ref 65–99)
GLUCOSE-CAPILLARY: 172 mg/dL — AB (ref 65–99)
Glucose-Capillary: 54 mg/dL — ABNORMAL LOW (ref 65–99)
Glucose-Capillary: 347 mg/dL — ABNORMAL HIGH (ref 65–99)
Glucose-Capillary: 205 mg/dL — ABNORMAL HIGH (ref 65–99)

## 2016-03-07 LAB — CBC
HEMATOCRIT: 38 % (ref 36.0–46.0)
HEMOGLOBIN: 11.5 g/dL — AB (ref 12.0–15.0)
MCH: 29.1 pg (ref 26.0–34.0)
MCHC: 30.3 g/dL (ref 30.0–36.0)
MCV: 96.2 fL (ref 78.0–100.0)
Platelets: 172 10*3/uL (ref 150–400)
RBC: 3.95 MIL/uL (ref 3.87–5.11)
RDW: 18 % — ABNORMAL HIGH (ref 11.5–15.5)
WBC: 18.8 10*3/uL — AB (ref 4.0–10.5)

## 2016-03-07 MED ORDER — METOPROLOL TARTRATE 25 MG PO TABS
25.0000 mg | ORAL_TABLET | Freq: Once | ORAL | Status: AC
  Administered 2016-03-07: 25 mg via ORAL
  Filled 2016-03-07: qty 1

## 2016-03-07 MED ORDER — INSULIN GLARGINE 100 UNIT/ML ~~LOC~~ SOLN
25.0000 [IU] | Freq: Two times a day (BID) | SUBCUTANEOUS | Status: DC
  Administered 2016-03-07 – 2016-03-08 (×2): 25 [IU] via SUBCUTANEOUS
  Filled 2016-03-07 (×4): qty 0.25

## 2016-03-07 MED ORDER — METOPROLOL TARTRATE 50 MG PO TABS
50.0000 mg | ORAL_TABLET | Freq: Two times a day (BID) | ORAL | Status: DC
  Administered 2016-03-07 – 2016-03-08 (×2): 50 mg via ORAL
  Filled 2016-03-07 (×2): qty 1

## 2016-03-07 NOTE — Progress Notes (Signed)
PROGRESS NOTE    [name] [name]  RUE:[id number] DOB: [birth year] DOA: 03/02/2016 PCP: Ignatius Specking, MD    Brief Narrative: [name] [name] is a 76 y.o. female with medical history significant of DM, hypothyroidism, HLD, HTN, COPD on home O2, and afib presents with difficulty  Breathing, and non productive cough, associated with afib with RVR.  She was admitted to step down, started on cardizem gtt and pulmonary consulted.   Assessment & Plan:   Principal Problem:   Atrial fibrillation with rapid ventricular response (HCC) Active Problems:   Chronic anticoagulation-CHADs VASc=6   Acute on chronic respiratory failure with hypoxia (HCC)   Uncontrolled type 2 diabetes mellitus with complication (HCC)   COPD (chronic obstructive pulmonary disease) (HCC)   Pressure injury of skin   Pleural effusion on right   Edema of right lower extremity   Acute on chronic respiratory failure with hypoxia; secondary to COPD exacerbation and right large pleural effusion.  Admitted to step down, started on IV steroids, duonebs, levaquin.  IR consulted for thoracentesis and the pleural fluid  About 1.5 lit of yellow serous fluid drained and sent for analysis. Pleural fluid protein is less than 3 g/dl, and gram stain is negative. Pleural fluid cultures have been negative so far. Transitioned  to po steroids as she is not wheezing.  Resume duonebs as needed.  Resume Levaquin to complete the course.  Repeat CXR to evaluate for recurrence of pleural fluid shows Increasing opacity at the right base in this patient with previous pleural effusion and thoracentesis. Likely associated atelectasis/consolidation. Opacity at the left base also possible atelectasis. Incentive spirometry.     Atrial fibrillation with RVR:  She was started on Cardizem gtt for rate control, transitioned to po cardizem and increased lopressor to 50 mg BID.  Resume eliquis for anti coagulation.  Probably brought on by increased work of  breathing and duonebs.  Improving. Transfer to telemetry tonight.    Uncontrolled DM: hgba1c 9.1 not well controlled.  SSI .  CBG (last 3)   Recent Labs  03/07/16 0732 03/07/16 0825 03/07/16 1116  GLUCAP 54* 117* 347*    One episode of hypoglycemia this am, decreased lantus back to 25 units BID and resume 5 units of novolog tidac and SSI.     Chronic diastolic heart failure/ CAD S/P PCI:  Resume lasix, spironolactone, aspirin and statin.   Hypothyroidism: resume synthroid.   Constipation: resume stool softeners. Added dulcolax suppository and fleet enema as needed.   Right leg swelling: venous duplex negative for DVT.   DVT prophylaxis: Eliquis.  Code Status: full code.  Family Communication: none at bedside.  Disposition Plan: possible transfer to telemetry tonight if her rate is controlled.    Consultants:   Pulmonary consult.    Procedures: thoracentesis.    Antimicrobials: levaquin   Subjective: Feels better currently on 3.5 lit of [place] oxygen. No new complaints.   Objective: Vitals:   03/07/16 0900 03/07/16 1000 03/07/16 1100 03/07/16 1334  BP: (!) 120/52 (!) 103/51    Pulse: (!) 106 (!) 110    Resp: 13 (!) 26    Temp:   97.7 F (36.5 C)   TempSrc:   Oral   SpO2: 96% 97%  96%  Weight:      Height:        Intake/Output Summary (Last 24 hours) at 03/07/16 1542 Last data filed at 03/07/16 1000  Gross per 24 hour  Intake  1065.83 ml  Output             3600 ml  Net         -2534.17 ml   Filed Weights   03/05/16 0400 03/06/16 0447 03/07/16 0500  Weight: 70.4 kg (155 lb 3.3 oz) 71.8 kg (158 lb 4.6 oz) 70.9 kg (156 lb 4.9 oz)    Examination:  General exam: comfortable sitting in the chair.  Respiratory system: air entry fair bilateral.  Cardiovascular system: S1 & S2 heard, irregular. No JVD, murmurs, rubs, gallops or clicks. No pedal edema. Gastrointestinal system: Abdomen is nondistended, soft and nontender. No organomegaly or  masses felt. Normal bowel sounds heard. Central nervous system: Alert and oriented. No focal neurological deficits. Extremities: Symmetric 5 x 5 power. Skin: No rashes, lesions or ulcers Psychiatry: Judgement and insight appear normal. Mood & affect appropriate.     Data Reviewed: I have personally reviewed following labs and imaging studies  CBC:  Recent Labs Lab 03/02/16 1251 03/03/16 0629 03/04/16 0443 03/07/16 0435  WBC 15.9* 9.4 16.7* 18.8*  HGB 11.9* 11.3* 11.5* 11.5*  HCT 38.8 36.6 37.6 38.0  MCV 95.6 95.8 95.4 96.2  PLT 371 247 240 172   Basic Metabolic Panel:  Recent Labs Lab 03/02/16 1251 03/03/16 0629 03/04/16 0443 03/04/16 1240 03/07/16 0435  NA 132* 133* 132*  --  137  K 3.6 4.2 3.6  --  4.0  CL 96* 95* 95*  --  93*  CO2 28 31 31   --  36*  GLUCOSE 263* 346* 373* 420* 87  BUN 25* 21* 27*  --  35*  CREATININE 1.05* 0.87 0.84  --  0.70  CALCIUM 8.6* 8.8* 8.8*  --  9.1   GFR: Estimated Creatinine Clearance: 58.2 mL/min (by C-G formula based on SCr of 0.7 mg/dL). Liver Function Tests: No results for input(s): AST, ALT, ALKPHOS, BILITOT, PROT, ALBUMIN in the last 168 hours. No results for input(s): LIPASE, AMYLASE in the last 168 hours. No results for input(s): AMMONIA in the last 168 hours. Coagulation Profile: No results for input(s): INR, PROTIME in the last 168 hours. Cardiac Enzymes:  Recent Labs Lab 03/02/16 1251 03/02/16 1541 03/02/16 1907 03/03/16 0055 03/03/16 0634  TROPONINI 0.04* 0.04* 0.03* 0.04* 0.03*   BNP (last 3 results) No results for input(s): PROBNP in the last 8760 hours. HbA1C: No results for input(s): HGBA1C in the last 72 hours. CBG:  Recent Labs Lab 03/06/16 1600 03/06/16 2121 03/07/16 0732 03/07/16 0825 03/07/16 1116  GLUCAP 290* 262* 54* 117* 347*   Lipid Profile: No results for input(s): CHOL, HDL, LDLCALC, TRIG, CHOLHDL, LDLDIRECT in the last 72 hours. Thyroid Function Tests: No results for input(s):  TSH, T4TOTAL, FREET4, T3FREE, THYROIDAB in the last 72 hours. Anemia Panel: No results for input(s): VITAMINB12, FOLATE, FERRITIN, TIBC, IRON, RETICCTPCT in the last 72 hours. Sepsis Labs:  Recent Labs Lab 03/02/16 1251 03/02/16 1541  LATICACIDVEN 1.5 1.4    Recent Results (from the past 240 hour(s))  MRSA PCR Screening     Status: None   Collection Time: 03/02/16  6:24 PM  Result Value Ref Range Status   MRSA by PCR NEGATIVE NEGATIVE Final    Comment:        The GeneXpert MRSA Assay (FDA approved for NASAL specimens only), is one component of a comprehensive MRSA colonization surveillance program. It is not intended to diagnose MRSA infection nor to guide or monitor treatment for MRSA infections.   Gram stain  Status: None   Collection Time: 03/03/16 12:05 PM  Result Value Ref Range Status   Specimen Description PLEURAL COLLECTED BY DOCTOR  Final   Special Requests BOTTLES DRAWN AEROBIC AND ANAEROBIC  Final   Gram Stain   Final    NO ORGANISMS SEEN WBC PRESENT,BOTH PMN AND MONONUCLEAR CYTOSPIN SMEAR    Report Status 03/03/2016 FINAL  Final  Culture, body fluid-bottle     Status: None (Preliminary result)   Collection Time: 03/03/16 12:05 PM  Result Value Ref Range Status   Specimen Description PLEURAL COLLECTED BY DOCTOR  Final   Special Requests BOTTLES DRAWN AEROBIC AND ANAEROBIC 10CC EACH  Final   Culture NO GROWTH 4 DAYS  Final   Report Status PENDING  Incomplete         Radiology Studies: Dg Chest Port 1 View  Result Date: 03/07/2016 CLINICAL DATA:  76 year old female with a history of pleural effusion shortness of breath. Thoracentesis 03/03/2016 EXAM: PORTABLE CHEST 1 VIEW COMPARISON:  03/03/2016, 03/02/2016 FINDINGS: Cardiomediastinal silhouette unchanged. Partial obscuration of the right heart border with obscuration the right hemidiaphragm. Increasing opacity at the right base. Partial obscuration of the left hemidiaphragm. Blunting of left  costophrenic angle. No pneumothorax. Calcifications of the aortic arch. No evidence of interlobular septal thickening. IMPRESSION: Increasing opacity at the right base in this patient with previous pleural effusion and thoracentesis. Likely associated atelectasis/consolidation. Opacity at the left base may represent atelectasis. Aortic atherosclerosis. Signed, Yvone NeuJaime S. Loreta AveWagner, DO [facility] Electronically Signed   By: Gilmer MorJaime  Wagner D.O.   On: 03/07/2016 08:30        Scheduled Meds: . apixaban  5 mg Oral BID  . aspirin  81 mg Oral Daily  . diltiazem  90 mg Oral Q6H  . feeding supplement (GLUCERNA SHAKE)  237 mL Oral TID BM  . insulin aspart  0-20 Units Subcutaneous TID WC  . insulin aspart  0-5 Units Subcutaneous QHS  . insulin aspart  5 Units Subcutaneous TID WC  . insulin glargine  25 Units Subcutaneous BID  . ipratropium-albuterol  3 mL Nebulization Q6H WA  . levofloxacin  500 mg Oral Daily  . levothyroxine  100 mcg Oral QAC breakfast  . mouth rinse  15 mL Mouth Rinse BID  . metoprolol tartrate  50 mg Oral BID  . pantoprazole  80 mg Oral Daily  . polyethylene glycol  17 g Oral Daily  . potassium chloride SA  20 mEq Oral Daily  . rosuvastatin  10 mg Oral QHS  . senna-docusate  2 tablet Oral BID  . spironolactone  12.5 mg Oral Daily   Continuous Infusions:    LOS: 5 days    Time spent: 35 minutes.     Kathlen ModyAKULA,Trenden Hazelrigg, MD [facility] Pager [phone]   If 7PM-7AM, please contact night-coverage [email or web address] Password [email or web address] 03/07/2016, 3:42 PM

## 2016-03-07 NOTE — Progress Notes (Signed)
Patient CBG was 54, non symptomatic, gave patient Ensure. Rechecked and was 117

## 2016-03-07 NOTE — Progress Notes (Addendum)
Inpatient Diabetes Program Recommendations  AACE/ADA: New Consensus Statement on Inpatient Glycemic Control (2015)  Target Ranges:  Prepandial:   less than 140 mg/dL      Peak postprandial:   less than 180 mg/dL (1-2 hours)      Critically ill patients:  140 - 180 mg/dL   Lab Results  Component Value Date   GLUCAP 117 (H) 03/07/2016   HGBA1C 9.1 (H) 03/03/2016    Review of Glycemic Control Results for [name] [name], Allahna O (MRN [id number]) as of 03/07/2016 10:50  Ref. Range 03/06/2016 11:09 03/06/2016 16:00 03/06/2016 21:21 03/07/2016 07:32 03/07/2016 08:25  Glucose-Capillary Latest Ref Range: 65 - 99 mg/dL 782308 (H) 956290 (H) 213262 (H) 54 (L) 117 (H)   Inpatient Diabetes Program Recommendations:  Noted last ordered dose of steroids given this am. Noted decrease in Lantus insulin. Will follow.  Thank you, Billy FischerJudy E. Nolah Krenzer, RN, MSN, CDE Inpatient Glycemic Control Team Team Pager [phone] (8am-5pm) 03/07/2016 10:51 AM

## 2016-03-07 NOTE — Progress Notes (Signed)
Subjective: She says she feels better. No new complaints. It has been recommended that she do skilled care facility rehabilitation but she's not sure she wants to do that. I don't think she fully understands because she told me that it's hard for her to drive from [place] to [address] to do rehabilitation. Her heart rate is back up this morning around 1:30. She says she still very weak. She denies any chest pain. She is still short of breath. No nausea or vomiting. No hemoptysis. No abdominal pain  Objective: Vital signs in last 24 hours: Temp:  [97.5 F (36.4 C)-97.8 F (36.6 C)] 97.6 F (36.4 C) (01/08 0400) Pulse Rate:  [82-127] 127 (01/08 0700) Resp:  [16-42] 42 (01/08 0700) BP: (94-140)/(50-104) 101/78 (01/08 0700) SpO2:  [81 %-100 %] 81 % (01/08 0700) Weight:  [70.9 kg (156 lb 4.9 oz)] 70.9 kg (156 lb 4.9 oz) (01/08 0500) Weight change: -0.9 kg (-1 lb 15.7 oz) Last BM Date: 03/06/16  Intake/Output from previous day: 01/07 0701 - 01/08 0700 In: 2200.8 [P.O.:2140; I.V.:60.8] Out: 4900 [Urine:4900]  PHYSICAL EXAM General appearance: alert, cooperative and mild distress Resp: Rhonchi and wheezing bilaterally but much less Cardio: Her heart is irregular with a rate of about 1:30 and she has trace to 1+ edema bilaterally GI: soft, non-tender; bowel sounds normal; no masses,  no organomegaly Extremities: Trace to 1+ edema Skin warm and dry. Mucous membranes are moist  Lab Results:  Results for orders placed or performed during the hospital encounter of 03/02/16 (from the past 48 hour(s))  Glucose, capillary     Status: Abnormal   Collection Time: 03/05/16 11:13 AM  Result Value Ref Range   Glucose-Capillary 311 (H) 65 - 99 mg/dL  Glucose, capillary     Status: Abnormal   Collection Time: 03/05/16  4:57 PM  Result Value Ref Range   Glucose-Capillary 217 (H) 65 - 99 mg/dL  Glucose, capillary     Status: Abnormal   Collection Time: 03/05/16  9:02 PM  Result Value Ref Range    Glucose-Capillary 280 (H) 65 - 99 mg/dL   Comment 1 Notify RN   Glucose, capillary     Status: Abnormal   Collection Time: 03/06/16  7:30 AM  Result Value Ref Range   Glucose-Capillary 259 (H) 65 - 99 mg/dL  Glucose, capillary     Status: Abnormal   Collection Time: 03/06/16 11:09 AM  Result Value Ref Range   Glucose-Capillary 308 (H) 65 - 99 mg/dL  Glucose, capillary     Status: Abnormal   Collection Time: 03/06/16  4:00 PM  Result Value Ref Range   Glucose-Capillary 290 (H) 65 - 99 mg/dL  Glucose, capillary     Status: Abnormal   Collection Time: 03/06/16  9:21 PM  Result Value Ref Range   Glucose-Capillary 262 (H) 65 - 99 mg/dL   Comment 1 Notify RN   CBC     Status: Abnormal   Collection Time: 03/07/16  4:35 AM  Result Value Ref Range   WBC 18.8 (H) 4.0 - 10.5 K/uL   RBC 3.95 3.87 - 5.11 MIL/uL   Hemoglobin 11.5 (L) 12.0 - 15.0 g/dL   HCT 38.0 36.0 - 46.0 %   MCV 96.2 78.0 - 100.0 fL   MCH 29.1 26.0 - 34.0 pg   MCHC 30.3 30.0 - 36.0 g/dL   RDW 18.0 (H) 11.5 - 15.5 %   Platelets 172 150 - 400 K/uL  Basic metabolic panel  Status: Abnormal   Collection Time: 03/07/16  4:35 AM  Result Value Ref Range   Sodium 137 135 - 145 mmol/L   Potassium 4.0 3.5 - 5.1 mmol/L   Chloride 93 (L) 101 - 111 mmol/L   CO2 36 (H) 22 - 32 mmol/L   Glucose, Bld 87 65 - 99 mg/dL   BUN 35 (H) 6 - 20 mg/dL   Creatinine, Ser 0.70 0.44 - 1.00 mg/dL   Calcium 9.1 8.9 - 10.3 mg/dL   GFR calc non Af Amer >60 >60 mL/min   GFR calc Af Amer >60 >60 mL/min    Comment: (NOTE) The eGFR has been calculated using the CKD EPI equation. This calculation has not been validated in all clinical situations. eGFR's persistently <60 mL/min signify possible Chronic Kidney Disease.    Anion gap 8 5 - 15  Glucose, capillary     Status: Abnormal   Collection Time: 03/07/16  7:32 AM  Result Value Ref Range   Glucose-Capillary 54 (L) 65 - 99 mg/dL   Comment 1 Notify RN    Comment 2 Document in Chart      ABGS No results for input(s): PHART, PO2ART, TCO2, HCO3 in the last 72 hours.  Invalid input(s): PCO2 CULTURES Recent Results (from the past 240 hour(s))  MRSA PCR Screening     Status: None   Collection Time: 03/02/16  6:24 PM  Result Value Ref Range Status   MRSA by PCR NEGATIVE NEGATIVE Final    Comment:        The GeneXpert MRSA Assay (FDA approved for NASAL specimens only), is one component of a comprehensive MRSA colonization surveillance program. It is not intended to diagnose MRSA infection nor to guide or monitor treatment for MRSA infections.   Gram stain     Status: None   Collection Time: 03/03/16 12:05 PM  Result Value Ref Range Status   Specimen Description PLEURAL COLLECTED BY DOCTOR  Final   Special Requests BOTTLES DRAWN AEROBIC AND ANAEROBIC  Final   Gram Stain   Final    NO ORGANISMS SEEN WBC PRESENT,BOTH PMN AND MONONUCLEAR CYTOSPIN SMEAR    Report Status 03/03/2016 FINAL  Final  Culture, body fluid-bottle     Status: None (Preliminary result)   Collection Time: 03/03/16 12:05 PM  Result Value Ref Range Status   Specimen Description PLEURAL COLLECTED BY DOCTOR  Final   Special Requests BOTTLES DRAWN AEROBIC AND ANAEROBIC 10CC EACH  Final   Culture NO GROWTH 3 DAYS  Final   Report Status PENDING  Incomplete   Studies/Results: No results found.  Medications:  Prior to Admission:  Prescriptions Prior to Admission  Medication Sig Dispense Refill Last Dose  . albuterol (PROVENTIL HFA;VENTOLIN HFA) 108 (90 Base) MCG/ACT inhaler Inhale 1-2 puffs into the lungs every 6 (six) hours as needed for wheezing or shortness of breath.   03/02/2016 at Unknown time  . albuterol (PROVENTIL) (2.5 MG/3ML) 0.083% nebulizer solution Take 2.5 mg by nebulization every 6 (six) hours as needed for wheezing or shortness of breath.   03/02/2016 at Unknown time  . ALPRAZolam (XANAX) 0.5 MG tablet Take 0.5 mg by mouth 3 (three) times daily as needed for anxiety.   03/01/2016 at  Unknown time  . dexlansoprazole (DEXILANT) 60 MG capsule Take 60 mg by mouth daily.     03/02/2016 at Unknown time  . diltiazem (TIAZAC) 180 MG 24 hr capsule Take 180 mg by mouth 2 (two) times daily.   03/02/2016 at  Unknown time  . ELIQUIS 5 MG TABS tablet TAKE 1 TABLET BY MOUTH TWICE DAILY 60 tablet 5 03/02/2016 at Unknown time  . furosemide (LASIX) 40 MG tablet Take 1 tablet (40 mg total) by mouth daily. 30 tablet 1 03/02/2016 at Unknown time  . glipiZIDE (GLUCOTROL) 10 MG tablet Take 1 tablet (10 mg total) by mouth 2 (two) times daily before a meal. Do not take glipizide if blood sugar falls below 125.   03/02/2016 at Unknown time  . HYDROcodone-acetaminophen (NORCO/VICODIN) 5-325 MG tablet Take 1 tablet by mouth 2 (two) times daily as needed for moderate pain.   unknown  . insulin glargine (LANTUS) 100 UNIT/ML injection Inject 0.15 mLs (15 Units total) into the skin 2 (two) times daily. 10 mL 11 unknown at Unknown time  . levalbuterol (XOPENEX) 0.63 MG/3ML nebulizer solution Take 3 mLs (0.63 mg total) by nebulization every 3 (three) hours as needed for wheezing or shortness of breath. 3 mL 12 03/02/2016 at Unknown time  . levofloxacin (LEVAQUIN) 500 MG tablet Take 500 mg by mouth daily.   03/02/2016 at Unknown time  . levothyroxine (SYNTHROID, LEVOTHROID) 100 MCG tablet Take 100 mcg by mouth daily before breakfast.   03/02/2016 at Unknown time  . meclizine (ANTIVERT) 25 MG tablet Take 12.5 mg by mouth daily as needed for dizziness.   unknown  . metoprolol tartrate (LOPRESSOR) 25 MG tablet Take 25 mg by mouth daily.    03/02/2016 at 0730  . nitroGLYCERIN (NITROSTAT) 0.4 MG SL tablet Place 1 tablet (0.4 mg total) under the tongue every 5 (five) minutes x 3 doses as needed for chest pain. For severe chest pain 25 tablet 3 unknown  . potassium chloride SA (K-DUR,KLOR-CON) 20 MEQ tablet Take 20 mEq by mouth daily.   03/02/2016 at Unknown time  . rosuvastatin (CRESTOR) 10 MG tablet Take 1 tablet (10 mg total) by mouth  daily. (Patient taking differently: Take 10 mg by mouth at bedtime. ) 30 tablet 6 03/01/2016 at Unknown time  . spironolactone (ALDACTONE) 25 MG tablet Take 0.5 tablets (12.5 mg total) by mouth daily. 30 tablet 0 03/02/2016 at Unknown time  . Vitamin D, Ergocalciferol, (DRISDOL) 50000 units CAPS capsule Take 50,000 Units by mouth every 7 (seven) days. Takes on Mondays.   Past Week at Unknown time  . Insulin Syringes, Disposable, U-100 1 ML MISC 15 Units by Does not apply route 2 (two) times daily. 200 each 6 01/04/2016 at Unknown time  . OXYGEN Inhale 2-3 L into the lungs continuous.   01/04/2016 at Unknown time  . predniSONE (DELTASONE) 5 MG tablet Label  & dispense according to the schedule below. 10 Pills PO for 3 days then, 8 Pills PO for 3 days, 6 Pills PO for 3 days, 4 Pills PO for 3 days, 2 Pills PO for 3 days, 1 Pills PO for 3 days, 1/2 Pill  PO for 3 days then STOP. Total 95 pills. (Patient not taking: Reported on 03/02/2016) 95 tablet 0 Completed Course at Unknown time   Scheduled: . apixaban  5 mg Oral BID  . aspirin  81 mg Oral Daily  . diltiazem  90 mg Oral Q6H  . feeding supplement (GLUCERNA SHAKE)  237 mL Oral TID BM  . furosemide  40 mg Oral Daily  . insulin aspart  0-20 Units Subcutaneous TID WC  . insulin aspart  0-5 Units Subcutaneous QHS  . insulin aspart  5 Units Subcutaneous TID WC  . insulin glargine  30  Units Subcutaneous BID  . ipratropium-albuterol  3 mL Nebulization Q6H WA  . levofloxacin  500 mg Oral Daily  . levothyroxine  100 mcg Oral QAC breakfast  . mouth rinse  15 mL Mouth Rinse BID  . metoprolol tartrate  25 mg Oral BID  . pantoprazole  80 mg Oral Daily  . polyethylene glycol  17 g Oral Daily  . potassium chloride SA  20 mEq Oral Daily  . predniSONE  40 mg Oral QAC breakfast  . rosuvastatin  10 mg Oral QHS  . senna-docusate  2 tablet Oral BID  . spironolactone  12.5 mg Oral Daily   Continuous:  DVO:[id number], albuterol, ALPRAZolam, bisacodyl,  HYDROcodone-acetaminophen, meclizine, ondansetron (ZOFRAN) IV, sodium phosphate  Assesment: She was admitted with COPD exacerbation large right pleural effusion atrial fib with rapid ventricular response and acute on chronic hypoxic respiratory failure. She is better. Her heart rate is still a problem despite treatment. Principal Problem:   Atrial fibrillation with rapid ventricular response (HCC) Active Problems:   Chronic anticoagulation-CHADs VASc=6   Acute on chronic respiratory failure with hypoxia (HCC)   Uncontrolled type 2 diabetes mellitus with complication (HCC)   COPD (chronic obstructive pulmonary disease) (HCC)   Pressure injury of skin   Pleural effusion on right   Edema of right lower extremity    Plan: Agree she needs chest x-ray to reevaluate pleural effusion. Continue her other treatments.    LOS: 5 days   Katsumi Wisler L 03/07/2016, 7:41 AM

## 2016-03-08 LAB — GLUCOSE, CAPILLARY
GLUCOSE-CAPILLARY: 217 mg/dL — AB (ref 65–99)
Glucose-Capillary: 169 mg/dL — ABNORMAL HIGH (ref 65–99)

## 2016-03-08 LAB — CULTURE, BODY FLUID W GRAM STAIN -BOTTLE: Culture: NO GROWTH

## 2016-03-08 LAB — CULTURE, BODY FLUID-BOTTLE

## 2016-03-08 MED ORDER — ASPIRIN 81 MG PO CHEW: 81.0000 mg | CHEWABLE_TABLET | Freq: Every day | ORAL | Status: AC

## 2016-03-08 MED ORDER — METOPROLOL TARTRATE 50 MG PO TABS
50.0000 mg | ORAL_TABLET | Freq: Two times a day (BID) | ORAL | Status: AC
Start: 2016-03-08 — End: ?

## 2016-03-08 MED ORDER — PREDNISONE 20 MG PO TABS: 20.0000 mg | ORAL_TABLET | Freq: Every day | ORAL | 0 refills | Status: AC

## 2016-03-08 MED ORDER — FUROSEMIDE 40 MG PO TABS: 40.0000 mg | ORAL_TABLET | Freq: Every day | ORAL | 0 refills | Status: AC

## 2016-03-08 MED ORDER — GLUCERNA SHAKE PO LIQD: 237.0000 mL | Freq: Three times a day (TID) | ORAL | 0 refills | Status: AC

## 2016-03-08 MED ORDER — LEVOFLOXACIN 500 MG PO TABS: 500.0000 mg | ORAL_TABLET | Freq: Every day | ORAL | 0 refills | Status: AC

## 2016-03-08 MED ORDER — HYDROCODONE-ACETAMINOPHEN 5-325 MG PO TABS: 1.0000 | ORAL_TABLET | Freq: Two times a day (BID) | ORAL | 0 refills | Status: AC | PRN

## 2016-03-08 MED ORDER — ALPRAZOLAM 0.5 MG PO TABS: 0.5000 mg | ORAL_TABLET | Freq: Three times a day (TID) | ORAL | 0 refills | Status: AC | PRN

## 2016-03-08 MED ORDER — INSULIN ASPART 100 UNIT/ML ~~LOC~~ SOLN: SUBCUTANEOUS | 11 refills | Status: AC

## 2016-03-08 MED ORDER — INSULIN GLARGINE 100 UNIT/ML ~~LOC~~ SOLN: 25.0000 [IU] | Freq: Two times a day (BID) | SUBCUTANEOUS | 11 refills | Status: AC

## 2016-03-08 NOTE — Care Management Note (Signed)
Case Management Note  Patient Details  Name: [name] [name] MRN: [id number] Date of Birth: 06/22/1940    If discussed at Long Length of Stay Meetings, dates discussed: 03/08/2016   Additional Comments:  Shela Esses, Chrystine OilerSharley Diane, RN 03/08/2016, 10:59 AM

## 2016-03-08 NOTE — Discharge Summary (Addendum)
Physician Discharge Summary  DARNETTE LAMPRON WUJ:[id number] DOB: 1940-10-27 DOA: 03/02/2016  PCP: Ignatius Specking, MD  Admit date: 03/02/2016 Discharge date: 03/08/2016  Admitted From: Home.  Disposition: SNF.  Recommendations for Outpatient Follow-up:  1. Follow up with PCP in 1-2 weeks 2. Please obtain BMP/CBC in one week PLEASE FOLLOW UP WITH CXR in 2 weeks.    Discharge Condition:stable.  CODE STATUS:Full code.  Diet recommendation: Heart Healthy  Brief/Interim Summary: Ariani Seier Jonesis a 76 y.o.femalewith medical history significant of DM, hypothyroidism, HLD, HTN, COPD on home O2, and afib presents with difficulty  Breathing, and non productive cough, associated with afib with RVR.  She was admitted to step down, started on cardizem gtt and pulmonary consulted and recommendations given.   Discharge Diagnoses:  Principal Problem:   Atrial fibrillation with rapid ventricular response (HCC) Active Problems:   Chronic anticoagulation-CHADs VASc=6   Acute on chronic respiratory failure with hypoxia (HCC)   Uncontrolled type 2 diabetes mellitus with complication (HCC)   COPD (chronic obstructive pulmonary disease) (HCC)   Pressure injury of skin   Pleural effusion on right   Edema of right lower extremity  Acute on chronic respiratory failure with hypoxia; secondary to COPD exacerbation and right large pleural effusion.  Admitted to step down, started on IV steroids, duonebs, levaquin.  IR consulted for thoracentesis and the pleural fluid  About 1.5 lit of yellow serous fluid drained and sent for analysis. Pleural fluid protein is less than 3 g/dl, and gram stain is negative. Pleural fluid cultures have been negative so far. Transitioned  to po steroids as she is not wheezing.  Resume duonebs as needed.  Resume Levaquin for 2  More days to complete the course.  Repeat CXR to evaluate for recurrence of pleural fluid shows Increasing opacity at the right base in this patient with  previous pleural effusion and thoracentesis. Likely associated atelectasis/consolidation. Opacity at the left base also possible atelectasis. Incentive spirometry.     Atrial fibrillation with RVR:  She was started on Cardizem gtt for rate control, transitioned to po cardizem and increased lopressor to 50 mg BID.  Resume eliquis for anti coagulation.  Probably brought on by increased work of breathing and duonebs.  Improving.    Uncontrolled DM: hgba1c 9.1 not well controlled.  SSI .  CBG (last 3)   Recent Labs  03/07/16 2118 03/08/16 0808 03/08/16 1134  GLUCAP 205* 169* 217*       One episode of hypoglycemia this am, decreased lantus back to 25 units BID and resume SSI.    Chronic diastolic heart failure/ CAD S/P PCI:  Resume lasix, spironolactone, aspirin and statin.   Hypothyroidism: resume synthroid.   Constipation: resume stool softeners. Added dulcolax suppository and fleet enema as needed.   Right leg swelling: venous duplex negative for DVT.    Discharge Instructions  Discharge Instructions    Diet - low sodium heart healthy    Complete by:  As directed    Discharge instructions    Complete by:  As directed    Follow up with PCP in one week.     Allergies as of 03/08/2016      Reactions   Adalat [nifedipine] Nausea And Vomiting   Claritin-d 12 Hour [loratadine-pseudoephedrine Er] Nausea And Vomiting   Codeine    REACTION: stomach upset   Plavix [clopidogrel Bisulfate] Other (See Comments)   Legs hurt   Wellbutrin [bupropion] Other (See Comments)   nightmares   Ciprofloxacin Nausea  And Vomiting   Metformin And Related Other (See Comments)   Fatigue       Medication List    STOP taking these medications   levalbuterol 0.63 MG/3ML nebulizer solution Commonly known as:  XOPENEX     TAKE these medications   albuterol (2.5 MG/3ML) 0.083% nebulizer solution Commonly known as:  PROVENTIL Take 2.5 mg by nebulization every 6 (six)  hours as needed for wheezing or shortness of breath.   albuterol 108 (90 Base) MCG/ACT inhaler Commonly known as:  PROVENTIL HFA;VENTOLIN HFA Inhale 1-2 puffs into the lungs every 6 (six) hours as needed for wheezing or shortness of breath.   ALPRAZolam 0.5 MG tablet Commonly known as:  XANAX Take 0.5 mg by mouth 3 (three) times daily as needed for anxiety.   aspirin 81 MG chewable tablet Chew 1 tablet (81 mg total) by mouth daily. Start taking on:  03/09/2016   DEXILANT 60 MG capsule Generic drug:  dexlansoprazole Take 60 mg by mouth daily.   diltiazem 180 MG 24 hr capsule Commonly known as:  TIAZAC Take 180 mg by mouth 2 (two) times daily.   ELIQUIS 5 MG Tabs tablet Generic drug:  apixaban TAKE 1 TABLET BY MOUTH TWICE DAILY   feeding supplement (GLUCERNA SHAKE) Liqd Take 237 mLs by mouth 3 (three) times daily between meals.   furosemide 40 MG tablet Commonly known as:  LASIX Take 1 tablet (40 mg total) by mouth daily. Start taking on:  03/10/2016   glipiZIDE 10 MG tablet Commonly known as:  GLUCOTROL Take 1 tablet (10 mg total) by mouth 2 (two) times daily before a meal. Do not take glipizide if blood sugar falls below 125.   HYDROcodone-acetaminophen 5-325 MG tablet Commonly known as:  NORCO/VICODIN Take 1 tablet by mouth 2 (two) times daily as needed for moderate pain.   insulin aspart 100 UNIT/ML injection Commonly known as:  novoLOG CBG 70 - 120: 0 units CBG 121 - 150: 0 units CBG 151 - 200: 0 units CBG 201 - 250: 2 units CBG 251 - 300: 3 units CBG 301 - 350: 4 units CBG 351 - 400: 5 units   insulin glargine 100 UNIT/ML injection Commonly known as:  LANTUS Inject 0.25 mLs (25 Units total) into the skin 2 (two) times daily. What changed:  how much to take   Insulin Syringes (Disposable) U-100 1 ML Misc 15 Units by Does not apply route 2 (two) times daily.   levofloxacin 500 MG tablet Commonly known as:  LEVAQUIN Take 1 tablet (500 mg total) by mouth  daily. Start taking on:  03/10/2016   levothyroxine 100 MCG tablet Commonly known as:  SYNTHROID, LEVOTHROID Take 100 mcg by mouth daily before breakfast.   meclizine 25 MG tablet Commonly known as:  ANTIVERT Take 12.5 mg by mouth daily as needed for dizziness.   metoprolol 50 MG tablet Commonly known as:  LOPRESSOR Take 1 tablet (50 mg total) by mouth 2 (two) times daily. What changed:  medication strength  how much to take  when to take this   nitroGLYCERIN 0.4 MG SL tablet Commonly known as:  NITROSTAT Place 1 tablet (0.4 mg total) under the tongue every 5 (five) minutes x 3 doses as needed for chest pain. For severe chest pain   OXYGEN Inhale 2-3 L into the lungs continuous.   potassium chloride SA 20 MEQ tablet Commonly known as:  K-DUR,KLOR-CON Take 20 mEq by mouth daily.   predniSONE 20 MG tablet Commonly  known as:  DELTASONE Take 1 tablet (20 mg total) by mouth daily with breakfast. Start taking on:  03/09/2016 What changed:  medication strength  how much to take  how to take this  when to take this  additional instructions   rosuvastatin 10 MG tablet Commonly known as:  CRESTOR Take 1 tablet (10 mg total) by mouth daily. What changed:  when to take this   spironolactone 25 MG tablet Commonly known as:  ALDACTONE Take 0.5 tablets (12.5 mg total) by mouth daily.   Vitamin D (Ergocalciferol) 50000 units Caps capsule Commonly known as:  DRISDOL Take 50,000 Units by mouth every 7 (seven) days. Takes on Mondays.       Allergies  Allergen Reactions  . Adalat [Nifedipine] Nausea And Vomiting  . Claritin-D 12 Hour [Loratadine-Pseudoephedrine Er] Nausea And Vomiting  . Codeine     REACTION: stomach upset  . Plavix [Clopidogrel Bisulfate] Other (See Comments)    Legs hurt  . Wellbutrin [Bupropion] Other (See Comments)    nightmares  . Ciprofloxacin Nausea And Vomiting  . Metformin And Related Other (See Comments)    Fatigue       Consultations:  pulmonology   Procedures/Studies: Dg Chest 2 View  Result Date: 03/02/2016 CLINICAL DATA:  COPD.  Worsening shortness of breath. EXAM: CHEST  2 VIEW COMPARISON:  01/09/2016. FINDINGS: Mediastinum hilar structures are unremarkable. Persistent cardiomegaly. Interval significant increase in right-sided pleural effusion. Large pleural effusion is now present. Underlying right lower lung disease cannot be excluded. No pneumothorax. IMPRESSION: 1. Interim significant increase in right-sided pleural effusion. Large right pleural effusion is now present. Underlying right lower lung disease cannot be excluded. 2. Stable cardiomegaly . Electronically Signed   By: Maisie Fus  Register   On: 03/02/2016 13:35   Ct Angio Chest Pe W Or Wo Contrast  Result Date: 03/02/2016 CLINICAL DATA:  Sudden onset of dyspnea and fever. Elevated D-dimer. EXAM: CT ANGIOGRAPHY CHEST WITH CONTRAST TECHNIQUE: Multidetector CT imaging of the chest was performed using the standard protocol during bolus administration of intravenous contrast. Multiplanar CT image reconstructions and MIPs were obtained to evaluate the vascular anatomy. CONTRAST:  100 cc Isovue 370 IV COMPARISON:  CXR 03/02/2016, chest CT 11/25/2015 FINDINGS: Cardiovascular: Heart is top normal in size. Diffuse coronary arteriosclerosis. No pericardial effusion. Aortic atherosclerosis without aneurysm or dissection. No large central pulmonary embolus. Mediastinum/Nodes: Stable 8 mm precarinal mediastinal lymph node with smaller upper paratracheal lymph nodes present. No hilar lymphadenopathy. Trachea is patent as are the mainstem bronchi. No definite esophageal abnormality. Lungs/Pleura: Large right effusion with compressive atelectasis sparing portions of the right upper lobe and right lower lobe. Centrilobular emphysema of the aerated left lung with trace left effusion. No pneumonic consolidation or dominant mass. Upper Abdomen: Nodular thickening of the  left adrenal gland possibly associated with a benign adenoma. No acute abnormality in the upper abdomen. Musculoskeletal: Mid thoracic degenerative disc disease. No acute osseous abnormality nor bone destruction. Review of the MIP images confirms the above findings. IMPRESSION: Large right effusion with compressive atelectasis sparing portions of the right upper and lower lobes. Centrilobular emphysema of the aerated left lung with trace left effusion. Aortic atherosclerosis with coronary arteriosclerosis. No acute pulmonary embolus. Stable nodular thickening of the left adrenal gland possibly associated with a stable adenoma or possibly mild hyperplasia. Electronically Signed   By: Tollie Eth M.D.   On: 03/02/2016 14:42   US Venous Img Lower Unilateral Right  Result Date: 03/03/2016 CLINICAL DATA:  Right  lower extremity swelling for 2 weeks with intermittent pain EXAM: RIGHT LOWER EXTREMITY VENOUS DOPPLER ULTRASOUND TECHNIQUE: Gray-scale sonography with graded compression, as well as color Doppler and duplex ultrasound were performed to evaluate the lower extremity deep venous systems from the level of the common femoral vein and including the common femoral, femoral, profunda femoral, popliteal and calf veins including the posterior tibial, peroneal and gastrocnemius veins when visible. The superficial great saphenous vein was also interrogated. Spectral Doppler was utilized to evaluate flow at rest and with distal augmentation maneuvers in the common femoral, femoral and popliteal veins. COMPARISON:  None. FINDINGS: Contralateral Common Femoral Vein: Respiratory phasicity is normal and symmetric with the symptomatic side. No evidence of thrombus. Normal compressibility. Common Femoral Vein: No evidence of thrombus. Normal compressibility, respiratory phasicity and response to augmentation. Saphenofemoral Junction: No evidence of thrombus. Normal compressibility and flow on color Doppler imaging. Profunda  Femoral Vein: No evidence of thrombus. Normal compressibility and flow on color Doppler imaging. Femoral Vein: No evidence of thrombus. Normal compressibility, respiratory phasicity and response to augmentation. Popliteal Vein: No evidence of thrombus. Normal compressibility, respiratory phasicity and response to augmentation. Calf Veins: No evidence of thrombus. Normal compressibility and flow on color Doppler imaging. Superficial Great Saphenous Vein: No evidence of thrombus. Normal compressibility and flow on color Doppler imaging. Venous Reflux:  None. Other Findings:  Subcutaneous calf and ankle ankle edema noted. IMPRESSION: No evidence of deep venous thrombosis. Electronically Signed   By: Judie PetitM.  Shick M.D.   On: 03/03/2016 15:42   Dg Chest Port 1 View  Result Date: 03/07/2016 CLINICAL DATA:  76 year old female with a history of pleural effusion shortness of breath. Thoracentesis 03/03/2016 EXAM: PORTABLE CHEST 1 VIEW COMPARISON:  03/03/2016, 03/02/2016 FINDINGS: Cardiomediastinal silhouette unchanged. Partial obscuration of the right heart border with obscuration the right hemidiaphragm. Increasing opacity at the right base. Partial obscuration of the left hemidiaphragm. Blunting of left costophrenic angle. No pneumothorax. Calcifications of the aortic arch. No evidence of interlobular septal thickening. IMPRESSION: Increasing opacity at the right base in this patient with previous pleural effusion and thoracentesis. Likely associated atelectasis/consolidation. Opacity at the left base may represent atelectasis. Aortic atherosclerosis. Signed, Yvone NeuJaime S. Loreta AveWagner, DO [facility] Electronically Signed   By: Gilmer MorJaime  Wagner D.O.   On: 03/07/2016 08:30   Dg Chest Port 1 View  Result Date: 03/03/2016 CLINICAL DATA:  Status post right thoracentesis today. EXAM: PORTABLE CHEST 1 VIEW COMPARISON:  CT chest and PA and lateral chest 03/02/2016. FINDINGS: Right  pleural effusion is markedly decreased after thoracentesis. No pneumothorax. Small residual right pleural effusion and basilar atelectasis are noted. Mild atelectasis is present in the left lung base. There is cardiomegaly. Aortic atherosclerosis noted. IMPRESSION: Marked decrease right pleural effusion after thoracentesis. Negative for pneumothorax. Cardiomegaly without edema. Electronically Signed   By: Drusilla Kannerhomas  Dalessio M.D.   On: 03/03/2016 12:27   [place] Thoracentesis Asp Pleural Space W/img Guide  Result Date: 03/03/2016 INDICATION: RIGHT pleural effusion EXAM: ULTRASOUND GUIDED DIAGNOSTIC AND THERAPEUTIC RIGHT THORACENTESIS MEDICATIONS: None. COMPLICATIONS: None immediate. PROCEDURE: Procedure, benefits, and risks of procedure were discussed with patient. Written informed consent for procedure was obtained. Time out protocol followed. Pleural effusion localized by ultrasound at the posterior RIGHT hemithorax. Skin prepped and draped in usual sterile fashion. Skin and soft tissues anesthetized with 10 mL of 1% lidocaine. 8 French thoracentesis catheter placed into the RIGHT pleural space. 1.5 L of clear yellow aspirated by syringe pump. Procedure tolerated  well by patient without immediate complication. FINDINGS: A total of approximately 1.5 L of RIGHT pleural fluid was removed. Samples were sent to the laboratory as requested by the clinical team. IMPRESSION: Successful ultrasound guided RIGHT thoracentesis yielding 1.5 L of pleural fluid. Electronically Signed   By: Ulyses Southward M.D.   On: 03/03/2016 12:28      Subjective: No new complaints.   Discharge Exam: Vitals:   03/08/16 1200 03/08/16 1300  BP: 109/69 (!) 86/45  Pulse: 95 97  Resp: (!) 26 19  Temp: 97.6 F (36.4 C)    Vitals:   03/08/16 1000 03/08/16 1100 03/08/16 1200 03/08/16 1300  BP:   109/69 (!) 86/45  Pulse: (!) 101 99 95 97  Resp: 16 (!) 21 (!) 26 19  Temp:   97.6 F (36.4 C)   TempSrc:   Oral   SpO2: 97% 95% 97% 98%   Weight:      Height:        General: Pt is alert, awake, not in acute distress, on 4 lit of [place] oxygen.  Cardiovascular: irregular,  S1/S2 +, no rubs, no gallops Respiratory: CTA bilaterally, no wheezing, no rhonchi Abdominal: Soft, NT, ND, bowel sounds + Extremities: no edema, no cyanosis    The results of significant diagnostics from this hospitalization (including imaging, microbiology, ancillary and laboratory) are listed below for reference.     Microbiology: Recent Results (from the past 240 hour(s))  MRSA PCR Screening     Status: None   Collection Time: 03/02/16  6:24 PM  Result Value Ref Range Status   MRSA by PCR NEGATIVE NEGATIVE Final    Comment:        The GeneXpert MRSA Assay (FDA approved for NASAL specimens only), is one component of a comprehensive MRSA colonization surveillance program. It is not intended to diagnose MRSA infection nor to guide or monitor treatment for MRSA infections.   Gram stain     Status: None   Collection Time: 03/03/16 12:05 PM  Result Value Ref Range Status   Specimen Description PLEURAL COLLECTED BY DOCTOR  Final   Special Requests BOTTLES DRAWN AEROBIC AND ANAEROBIC  Final   Gram Stain   Final    NO ORGANISMS SEEN WBC PRESENT,BOTH PMN AND MONONUCLEAR CYTOSPIN SMEAR    Report Status 03/03/2016 FINAL  Final  Culture, body fluid-bottle     Status: None   Collection Time: 03/03/16 12:05 PM  Result Value Ref Range Status   Specimen Description PLEURAL COLLECTED BY DOCTOR  Final   Special Requests BOTTLES DRAWN AEROBIC AND ANAEROBIC 10CC EACH  Final   Culture NO GROWTH 5 DAYS  Final   Report Status 03/08/2016 FINAL  Final     Labs: BNP (last 3 results)  Recent Labs  11/25/15 1210 12/09/15 1410 03/02/16 1251  BNP 218.0* 331.0* 263.0*   Basic Metabolic Panel:  Recent Labs Lab 03/02/16 1251 03/03/16 0629 03/04/16 0443 03/04/16 1240 03/07/16 0435  NA 132* 133* 132*  --  137  K 3.6 4.2 3.6  --  4.0  CL 96* 95*  95*  --  93*  CO2 28 31 31   --  36*  GLUCOSE 263* 346* 373* 420* 87  BUN 25* 21* 27*  --  35*  CREATININE 1.05* 0.87 0.84  --  0.70  CALCIUM 8.6* 8.8* 8.8*  --  9.1   Liver Function Tests: No results for input(s): AST, ALT, ALKPHOS, BILITOT, PROT, ALBUMIN in the last 168 hours. No results  for input(s): LIPASE, AMYLASE in the last 168 hours. No results for input(s): AMMONIA in the last 168 hours. CBC:  Recent Labs Lab 03/02/16 1251 03/03/16 0629 03/04/16 0443 03/07/16 0435  WBC 15.9* 9.4 16.7* 18.8*  HGB 11.9* 11.3* 11.5* 11.5*  HCT 38.8 36.6 37.6 38.0  MCV 95.6 95.8 95.4 96.2  PLT 371 247 240 172   Cardiac Enzymes:  Recent Labs Lab 03/02/16 1251 03/02/16 1541 03/02/16 1907 03/03/16 0055 03/03/16 0634  TROPONINI 0.04* 0.04* 0.03* 0.04* 0.03*   BNP: Invalid input(s): POCBNP CBG:  Recent Labs Lab 03/07/16 1116 03/07/16 1618 03/07/16 2118 03/08/16 0808 03/08/16 1134  GLUCAP 347* 172* 205* 169* 217*   D-Dimer No results for input(s): DDIMER in the last 72 hours. Hgb A1c No results for input(s): HGBA1C in the last 72 hours. Lipid Profile No results for input(s): CHOL, HDL, LDLCALC, TRIG, CHOLHDL, LDLDIRECT in the last 72 hours. Thyroid function studies No results for input(s): TSH, T4TOTAL, T3FREE, THYROIDAB in the last 72 hours.  Invalid input(s): FREET3 Anemia work up No results for input(s): VITAMINB12, FOLATE, FERRITIN, TIBC, IRON, RETICCTPCT in the last 72 hours. Urinalysis    Component Value Date/Time   COLORURINE YELLOW 01/05/2016 0611   APPEARANCEUR CLEAR 01/05/2016 0611   LABSPEC 1.025 01/05/2016 0611   PHURINE 6.0 01/05/2016 0611   GLUCOSEU 500 (A) 01/05/2016 0611   HGBUR SMALL (A) 01/05/2016 0611   BILIRUBINUR NEGATIVE 01/05/2016 0611   KETONESUR NEGATIVE 01/05/2016 0611   PROTEINUR 100 (A) 01/05/2016 0611   NITRITE NEGATIVE 01/05/2016 0611   LEUKOCYTESUR NEGATIVE 01/05/2016 0611   Sepsis Labs Invalid input(s): PROCALCITONIN,  WBC,   LACTICIDVEN Microbiology Recent Results (from the past 240 hour(s))  MRSA PCR Screening     Status: None   Collection Time: 03/02/16  6:24 PM  Result Value Ref Range Status   MRSA by PCR NEGATIVE NEGATIVE Final    Comment:        The GeneXpert MRSA Assay (FDA approved for NASAL specimens only), is one component of a comprehensive MRSA colonization surveillance program. It is not intended to diagnose MRSA infection nor to guide or monitor treatment for MRSA infections.   Gram stain     Status: None   Collection Time: 03/03/16 12:05 PM  Result Value Ref Range Status   Specimen Description PLEURAL COLLECTED BY DOCTOR  Final   Special Requests BOTTLES DRAWN AEROBIC AND ANAEROBIC  Final   Gram Stain   Final    NO ORGANISMS SEEN WBC PRESENT,BOTH PMN AND MONONUCLEAR CYTOSPIN SMEAR    Report Status 03/03/2016 FINAL  Final  Culture, body fluid-bottle     Status: None   Collection Time: 03/03/16 12:05 PM  Result Value Ref Range Status   Specimen Description PLEURAL COLLECTED BY DOCTOR  Final   Special Requests BOTTLES DRAWN AEROBIC AND ANAEROBIC 10CC EACH  Final   Culture NO GROWTH 5 DAYS  Final   Report Status 03/08/2016 FINAL  Final     Time coordinating discharge: Over 30 minutes  SIGNED:   Kathlen Mody, MD  [facility] 03/08/2016, 2:52 PM Pager   If 7PM-7AM, please contact night-coverage [email or web address] Password [email or web address]

## 2016-03-08 NOTE — [place] FL2 (Signed)
[place] MEDICAID FL2 LEVEL OF CARE SCREENING TOOL     IDENTIFICATION  Patient Name: [name] [name] Birthdate: [birth year] Sex: female Admission Date (Current Location): 03/02/2016  [place] and [place] Number:  Aaron Edelman [id number] L ([id number]) Facility and Address:  [facility],  [address] [place] [address]      Provider Number: [id number]  Attending Physician Name and Address:  Kathlen Mody, MD  Relative Name and Phone Number:       Current Level of Care: Hospital Recommended Level of Care: Skilled Nursing Facility Prior Approval Number:    Date Approved/Denied:   PASRR Number: [id number] ([id number])  Discharge Plan: SNF    Current Diagnoses: Patient Active Problem List   Diagnosis Date Noted  . Atrial fibrillation with rapid ventricular response (HCC) 03/02/2016  . Pressure injury of skin 03/02/2016  . Pleural effusion on right 03/02/2016  . Edema of right lower extremity 03/02/2016  . 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 HCAP (healthcare-associated pneumonia) 01/05/2016  . COPD (chronic obstructive pulmonary disease) (HCC) 12/09/2015  . Diastolic CHF, acute on chronic (HCC)   . Uncontrolled type 2 diabetes mellitus with complication (HCC)   . Uncontrolled type 2 diabetes mellitus with complication, without long-term current use of insulin (HCC)   . Acute on chronic respiratory failure with hypoxia (HCC)   . CAP (community acquired pneumonia)   . Acute on chronic diastolic CHF (congestive heart failure) (HCC)   . Other specified hypothyroidism   . Chronic anticoagulation-CHADs VASc=6 09/07/2015  . Type 2 diabetes with decreased circulation (HCC) 09/07/2015  . Sinus bradycardia 09/07/2012  . CAROTID ARTERY DISEASE 04/09/2009  . COPD with acute exacerbation (HCC) 04/09/2009  . Dyslipidemia 11/20/2008  . Essential hypertension, benign 11/20/2008  . CAD S/P percutaneous coronary angioplasty 11/20/2008    Orientation RESPIRATION  BLADDER Height & Weight     Situation  O2 (3L) Continent Weight: 152 lb 12.5 oz (69.3 kg) Height:  5\' 3"  (160 cm)  BEHAVIORAL SYMPTOMS/MOOD NEUROLOGICAL BOWEL NUTRITION STATUS      Continent Diet (Heart Healthy/Carb Modified)  AMBULATORY STATUS COMMUNICATION OF NEEDS Skin   Limited Assist Verbally PU Stage and Appropriate Care ([place])                       Personal Care Assistance Level of Assistance  Bathing, Feeding, Dressing Bathing Assistance: Limited assistance Feeding assistance: Independent Dressing Assistance: Limited assistance     Functional Limitations Info  Sight, Hearing, Speech Sight Info: Adequate Hearing Info: Adequate Speech Info: Adequate    SPECIAL CARE FACTORS FREQUENCY  PT (By licensed PT), OT (By licensed OT)     PT Frequency: 5x/week OT Frequency: 3x/week            Contractures Contractures Info: Present    Additional Factors Info  Code Status, Allergies Code Status Info: Full Allergies Info: Adalat, Claritin-d 12 hour, Codeine, Plavix, Wellbutrin, Ciprofloxacin, Metformin and related            Current Medications (03/08/2016):  This is the current hospital active medication list Current Facility-Administered Medications  Medication Dose Route Frequency Provider Last Rate Last Dose  . acetaminophen (TYLENOL) tablet 650 mg  650 mg Oral Q4H PRN Jonah Blue, MD      . albuterol (PROVENTIL) (2.5 MG/3ML) 0.083% nebulizer solution 2.5 mg  2.5 mg Nebulization Q2H PRN Jonah Blue, MD   2.5 mg at 03/06/16 1149  . ALPRAZolam (XANAX) tablet 0.5 mg  0.5 mg Oral TID PRN  Jonah BlueJennifer Yates, MD   0.5 mg at 03/07/16 2151  . apixaban (ELIQUIS) tablet 5 mg  5 mg Oral BID Jonah BlueJennifer Yates, MD   5 mg at 03/08/16 0908  . aspirin chewable tablet 81 mg  81 mg Oral Daily Jonah BlueJennifer Yates, MD   81 mg at 03/08/16 0908  . bisacodyl (DULCOLAX) suppository 10 mg  10 mg Rectal Daily PRN Kathlen ModyVijaya Jaeson Molstad, MD      . diltiazem (CARDIZEM) tablet 90 mg  90 mg Oral Q6H  Kathlen ModyVijaya Braelynn Lupton, MD   90 mg at 03/08/16 1143  . feeding supplement (GLUCERNA SHAKE) (GLUCERNA SHAKE) liquid 237 mL  237 mL Oral TID BM Kathlen ModyVijaya Aundrea Higginbotham, MD   237 mL at 03/08/16 0911  . HYDROcodone-acetaminophen (NORCO/VICODIN) 5-325 MG per tablet 1 tablet  1 tablet Oral BID PRN Jonah BlueJennifer Yates, MD      . insulin aspart (novoLOG) injection 0-20 Units  0-20 Units Subcutaneous TID WC Kathlen ModyVijaya Wilene Pharo, MD   7 Units at 03/08/16 1144  . insulin aspart (novoLOG) injection 0-5 Units  0-5 Units Subcutaneous QHS Jonah BlueJennifer Yates, MD   2 Units at 03/07/16 2152  . insulin aspart (novoLOG) injection 5 Units  5 Units Subcutaneous TID WC Kathlen ModyVijaya Christol Thetford, MD   5 Units at 03/08/16 1143  . insulin glargine (LANTUS) injection 25 Units  25 Units Subcutaneous BID Kathlen ModyVijaya Lennyn Gange, MD   25 Units at 03/08/16 0909  . ipratropium-albuterol (DUONEB) 0.5-2.5 (3) MG/3ML nebulizer solution 3 mL  3 mL Nebulization Q6H WA Kathlen ModyVijaya Hannalee Castor, MD   3 mL at 03/08/16 0849  . levofloxacin (LEVAQUIN) tablet 500 mg  500 mg Oral Daily Jonah BlueJennifer Yates, MD   500 mg at 03/08/16 0908  . levothyroxine (SYNTHROID, LEVOTHROID) tablet 100 mcg  100 mcg Oral QAC breakfast Jonah BlueJennifer Yates, MD   100 mcg at 03/08/16 0824  . meclizine (ANTIVERT) tablet 12.5 mg  12.5 mg Oral Daily PRN Jonah BlueJennifer Yates, MD      . MEDLINE mouth rinse  15 mL Mouth Rinse BID Jonah BlueJennifer Yates, MD   15 mL at 03/08/16 0911  . metoprolol (LOPRESSOR) tablet 50 mg  50 mg Oral BID Kathlen ModyVijaya Braycen Burandt, MD   50 mg at 03/08/16 0908  . ondansetron (ZOFRAN) injection 4 mg  4 mg Intravenous Q6H PRN Jonah BlueJennifer Yates, MD      . pantoprazole (PROTONIX) EC tablet 80 mg  80 mg Oral Daily Jonah BlueJennifer Yates, MD   80 mg at 03/08/16 0908  . polyethylene glycol (MIRALAX / GLYCOLAX) packet 17 g  17 g Oral Daily Kathlen ModyVijaya Nazaret Chea, MD   17 g at 03/08/16 0908  . potassium chloride SA (K-DUR,KLOR-CON) CR tablet 20 mEq  20 mEq Oral Daily Jonah BlueJennifer Yates, MD   20 mEq at 03/08/16 0908  . rosuvastatin (CRESTOR) tablet 10 mg  10 mg Oral QHS Jonah BlueJennifer  Yates, MD   10 mg at 03/07/16 2151  . senna-docusate (Senokot-S) tablet 2 tablet  2 tablet Oral BID Kathlen ModyVijaya Iban Utz, MD   2 tablet at 03/06/16 1026  . sodium phosphate (FLEET) 7-19 GM/118ML enema 1 enema  1 enema Rectal Daily PRN Kathlen ModyVijaya Seraiah Nowack, MD      . spironolactone (ALDACTONE) tablet 12.5 mg  12.5 mg Oral Daily Jonah BlueJennifer Yates, MD   12.5 mg at 03/08/16 0908     Discharge Medications: Please see discharge summary for a list of discharge medications.  Relevant Imaging Results:  Relevant Lab Results:   Additional Information SSN [id number]   Settle, Juleen ChinaHeather D, LCSW

## 2016-03-08 NOTE — Progress Notes (Signed)
Subjective: She says she feels better. She has no new complaints. She says her breathing is better. She denies any chest pain. No other new complaints. No nausea vomiting diarrhea. No hemoptysis.  Objective: Vital signs in last 24 hours: Temp:  [97.4 F (36.3 C)-97.7 F (36.5 C)] 97.7 F (36.5 C) (01/09 0400) Pulse Rate:  [76-121] 90 (01/09 0600) Resp:  [13-32] 17 (01/09 0600) BP: (88-141)/(47-98) 120/68 (01/09 0605) SpO2:  [87 %-99 %] 98 % (01/09 0600) FiO2 (%):  [32 %] 32 % (01/08 2300) Weight:  [69.3 kg (152 lb 12.5 oz)] 69.3 kg (152 lb 12.5 oz) (01/09 0600) Weight change: -1.6 kg (-3 lb 8.4 oz) Last BM Date: 03/06/16  Intake/Output from previous day: 01/08 0701 - 01/09 0700 In: 600 [P.O.:600] Out: 1700 [Urine:1700]  PHYSICAL EXAM General appearance: alert, cooperative and mild distress Resp: She has bilateral rhonchi and has some diminished breath sounds on the right base. Cardio: Heart is irregularly irregular still with a heart rate between 100 and 110 GI: soft, non-tender; bowel sounds normal; no masses,  no organomegaly Extremities: Trace edema of right leg Mucous membranes are moist. Pupils react. Skin warm and dry  Lab Results:  Results for orders placed or performed during the hospital encounter of 03/02/16 (from the past 48 hour(s))  Glucose, capillary     Status: Abnormal   Collection Time: 03/06/16  7:30 AM  Result Value Ref Range   Glucose-Capillary 259 (H) 65 - 99 mg/dL  Glucose, capillary     Status: Abnormal   Collection Time: 03/06/16 11:09 AM  Result Value Ref Range   Glucose-Capillary 308 (H) 65 - 99 mg/dL  Glucose, capillary     Status: Abnormal   Collection Time: 03/06/16  4:00 PM  Result Value Ref Range   Glucose-Capillary 290 (H) 65 - 99 mg/dL  Glucose, capillary     Status: Abnormal   Collection Time: 03/06/16  9:21 PM  Result Value Ref Range   Glucose-Capillary 262 (H) 65 - 99 mg/dL   Comment 1 Notify RN   CBC     Status: Abnormal   Collection Time: 03/07/16  4:35 AM  Result Value Ref Range   WBC 18.8 (H) 4.0 - 10.5 K/uL   RBC 3.95 3.87 - 5.11 MIL/uL   Hemoglobin 11.5 ([name]) 12.0 - 15.0 g/dL   HCT 38.0 36.0 - 46.0 %   MCV 96.2 78.0 - 100.0 fL   MCH 29.1 26.0 - 34.0 pg   MCHC 30.3 30.0 - 36.0 g/dL   RDW 18.0 (H) 11.5 - 15.5 %   Platelets 172 150 - 400 K/uL  Basic metabolic panel     Status: Abnormal   Collection Time: 03/07/16  4:35 AM  Result Value Ref Range   Sodium 137 135 - 145 mmol/[name]   Potassium 4.0 3.5 - 5.1 mmol/[name]   Chloride 93 ([name]) 101 - 111 mmol/[name]   CO2 36 (H) 22 - 32 mmol/[name]   Glucose, Bld 87 65 - 99 mg/dL   BUN 35 (H) 6 - 20 mg/dL   Creatinine, Ser 0.70 0.44 - 1.00 mg/dL   Calcium 9.1 8.9 - 10.3 mg/dL   GFR calc non Af Amer >60 >60 mL/min   GFR calc Af Amer >60 >60 mL/min    Comment: (NOTE) The eGFR has been calculated using the CKD EPI equation. This calculation has not been validated in all clinical situations. eGFR's persistently <60 mL/min signify possible Chronic Kidney Disease.    Anion gap 8 5 -  15  Glucose, capillary     Status: Abnormal   Collection Time: 03/07/16  7:32 AM  Result Value Ref Range   Glucose-Capillary 54 ([name]) 65 - 99 mg/dL   Comment 1 Notify RN    Comment 2 Document in Chart   Glucose, capillary     Status: Abnormal   Collection Time: 03/07/16  8:25 AM  Result Value Ref Range   Glucose-Capillary 117 (H) 65 - 99 mg/dL   Comment 1 Notify RN    Comment 2 Document in Chart   Glucose, capillary     Status: Abnormal   Collection Time: 03/07/16 11:16 AM  Result Value Ref Range   Glucose-Capillary 347 (H) 65 - 99 mg/dL   Comment 1 Notify RN    Comment 2 Document in Chart   Glucose, capillary     Status: Abnormal   Collection Time: 03/07/16  4:18 PM  Result Value Ref Range   Glucose-Capillary 172 (H) 65 - 99 mg/dL  Glucose, capillary     Status: Abnormal   Collection Time: 03/07/16  9:18 PM  Result Value Ref Range   Glucose-Capillary 205 (H) 65 - 99 mg/dL    ABGS No  results for input(s): PHART, PO2ART, TCO2, HCO3 in the last 72 hours.  Invalid input(s): PCO2 CULTURES Recent Results (from the past 240 hour(s))  MRSA PCR Screening     Status: None   Collection Time: 03/02/16  6:24 PM  Result Value Ref Range Status   MRSA by PCR NEGATIVE NEGATIVE Final    Comment:        The GeneXpert MRSA Assay (FDA approved for NASAL specimens only), is one component of a comprehensive MRSA colonization surveillance program. It is not intended to diagnose MRSA infection nor to guide or monitor treatment for MRSA infections.   Gram stain     Status: None   Collection Time: 03/03/16 12:05 PM  Result Value Ref Range Status   Specimen Description PLEURAL COLLECTED BY DOCTOR  Final   Special Requests BOTTLES DRAWN AEROBIC AND ANAEROBIC  Final   Gram Stain   Final    NO ORGANISMS SEEN WBC PRESENT,BOTH PMN AND MONONUCLEAR CYTOSPIN SMEAR    Report Status 03/03/2016 FINAL  Final  Culture, body fluid-bottle     Status: None (Preliminary result)   Collection Time: 03/03/16 12:05 PM  Result Value Ref Range Status   Specimen Description PLEURAL COLLECTED BY DOCTOR  Final   Special Requests BOTTLES DRAWN AEROBIC AND ANAEROBIC 10CC EACH  Final   Culture NO GROWTH 4 DAYS  Final   Report Status PENDING  Incomplete   Studies/Results: Dg Chest Port 1 View  Result Date: 03/07/2016 CLINICAL DATA:  76 year old female with a history of pleural effusion shortness of breath. Thoracentesis 03/03/2016 EXAM: PORTABLE CHEST 1 VIEW COMPARISON:  03/03/2016, 03/02/2016 FINDINGS: Cardiomediastinal silhouette unchanged. Partial obscuration of the right heart border with obscuration the right hemidiaphragm. Increasing opacity at the right base. Partial obscuration of the left hemidiaphragm. Blunting of left costophrenic angle. No pneumothorax. Calcifications of the aortic arch. No evidence of interlobular septal thickening. IMPRESSION: Increasing opacity at the right base in this patient  with previous pleural effusion and thoracentesis. Likely associated atelectasis/consolidation. Opacity at the left base may represent atelectasis. Aortic atherosclerosis. Signed, Dulcy Fanny. Earleen Newport, DO [facility] Electronically Signed   By: Corrie Mckusick D.O.   On: 03/07/2016 08:30    Medications:  Prior to Admission:  Prescriptions Prior to Admission  Medication Sig Dispense Refill Last Dose  . albuterol (PROVENTIL HFA;VENTOLIN HFA) 108 (90 Base) MCG/ACT inhaler Inhale 1-2 puffs into the lungs every 6 (six) hours as needed for wheezing or shortness of breath.   03/02/2016 at Unknown time  . albuterol (PROVENTIL) (2.5 MG/3ML) 0.083% nebulizer solution Take 2.5 mg by nebulization every 6 (six) hours as needed for wheezing or shortness of breath.   03/02/2016 at Unknown time  . ALPRAZolam (XANAX) 0.5 MG tablet Take 0.5 mg by mouth 3 (three) times daily as needed for anxiety.   03/01/2016 at Unknown time  . dexlansoprazole (DEXILANT) 60 MG capsule Take 60 mg by mouth daily.     03/02/2016 at Unknown time  . diltiazem (TIAZAC) 180 MG 24 hr capsule Take 180 mg by mouth 2 (two) times daily.   03/02/2016 at Unknown time  . ELIQUIS 5 MG TABS tablet TAKE 1 TABLET BY MOUTH TWICE DAILY 60 tablet 5 03/02/2016 at Unknown time  . furosemide (LASIX) 40 MG tablet Take 1 tablet (40 mg total) by mouth daily. 30 tablet 1 03/02/2016 at Unknown time  . glipiZIDE (GLUCOTROL) 10 MG tablet Take 1 tablet (10 mg total) by mouth 2 (two) times daily before a meal. Do not take glipizide if blood sugar falls below 125.   03/02/2016 at Unknown time  . HYDROcodone-acetaminophen (NORCO/VICODIN) 5-325 MG tablet Take 1 tablet by mouth 2 (two) times daily as needed for moderate pain.   unknown  . insulin glargine (LANTUS) 100 UNIT/ML injection Inject 0.15 mLs (15 Units total) into the skin 2 (two) times daily. 10 mL 11 unknown at Unknown time  . levalbuterol (XOPENEX) 0.63 MG/3ML nebulizer  solution Take 3 mLs (0.63 mg total) by nebulization every 3 (three) hours as needed for wheezing or shortness of breath. 3 mL 12 03/02/2016 at Unknown time  . levofloxacin (LEVAQUIN) 500 MG tablet Take 500 mg by mouth daily.   03/02/2016 at Unknown time  . levothyroxine (SYNTHROID, LEVOTHROID) 100 MCG tablet Take 100 mcg by mouth daily before breakfast.   03/02/2016 at Unknown time  . meclizine (ANTIVERT) 25 MG tablet Take 12.5 mg by mouth daily as needed for dizziness.   unknown  . metoprolol tartrate (LOPRESSOR) 25 MG tablet Take 25 mg by mouth daily.    03/02/2016 at 0730  . nitroGLYCERIN (NITROSTAT) 0.4 MG SL tablet Place 1 tablet (0.4 mg total) under the tongue every 5 (five) minutes x 3 doses as needed for chest pain. For severe chest pain 25 tablet 3 unknown  . potassium chloride SA (K-DUR,KLOR-CON) 20 MEQ tablet Take 20 mEq by mouth daily.   03/02/2016 at Unknown time  . rosuvastatin (CRESTOR) 10 MG tablet Take 1 tablet (10 mg total) by mouth daily. (Patient taking differently: Take 10 mg by mouth at bedtime. ) 30 tablet 6 03/01/2016 at Unknown time  . spironolactone (ALDACTONE) 25 MG tablet Take 0.5 tablets (12.5 mg total) by mouth daily. 30 tablet 0 03/02/2016 at Unknown time  . Vitamin D, Ergocalciferol, (DRISDOL) 50000 units CAPS capsule Take 50,000 Units by mouth every 7 (seven) days. Takes on Mondays.   Past Week at Unknown time  . Insulin Syringes, Disposable, U-100 1 ML MISC 15 Units by Does not apply route 2 (two) times daily. 200 each 6 01/04/2016 at Unknown time  . OXYGEN Inhale 2-3 [name] into the lungs continuous.   01/04/2016 at Unknown time  . predniSONE (DELTASONE) 5 MG tablet Label  & dispense according to the schedule below. 10 Pills  PO for 3 days then, 8 Pills PO for 3 days, 6 Pills PO for 3 days, 4 Pills PO for 3 days, 2 Pills PO for 3 days, 1 Pills PO for 3 days, 1/2 Pill  PO for 3 days then STOP. Total 95 pills. (Patient not taking: Reported on 03/02/2016) 95 tablet 0 Completed Course at Unknown  time   Scheduled: . apixaban  5 mg Oral BID  . aspirin  81 mg Oral Daily  . diltiazem  90 mg Oral Q6H  . feeding supplement (GLUCERNA SHAKE)  237 mL Oral TID BM  . insulin aspart  0-20 Units Subcutaneous TID WC  . insulin aspart  0-5 Units Subcutaneous QHS  . insulin aspart  5 Units Subcutaneous TID WC  . insulin glargine  25 Units Subcutaneous BID  . ipratropium-albuterol  3 mL Nebulization Q6H WA  . levofloxacin  500 mg Oral Daily  . levothyroxine  100 mcg Oral QAC breakfast  . mouth rinse  15 mL Mouth Rinse BID  . metoprolol tartrate  50 mg Oral BID  . pantoprazole  80 mg Oral Daily  . polyethylene glycol  17 g Oral Daily  . potassium chloride SA  20 mEq Oral Daily  . rosuvastatin  10 mg Oral QHS  . senna-docusate  2 tablet Oral BID  . spironolactone  12.5 mg Oral Daily   Continuous:  PNT:[id number], albuterol, ALPRAZolam, bisacodyl, HYDROcodone-acetaminophen, meclizine, ondansetron (ZOFRAN) IV, sodium phosphate  Assesment: She was admitted with atrial fibrillation with rapid ventricular response. She has COPD exacerbation and had a large right pleural effusion. She had repeat chest x-ray yesterday which I have personally reviewed and she still has some fluid on the right but it is much better. She looks like she has some atelectasis as well. Her COPD is improving. She still has elevated heart rate. She is very weak. Principal Problem:   Atrial fibrillation with rapid ventricular response (HCC) Active Problems:   Chronic anticoagulation-CHADs VASc=6   Acute on chronic respiratory failure with hypoxia (HCC)   Uncontrolled type 2 diabetes mellitus with complication (HCC)   COPD (chronic obstructive pulmonary disease) (HCC)   Pressure injury of skin   Pleural effusion on right   Edema of right lower extremity    Plan: Add incentive spirometry. Continue other treatments. She says she's going to go to rehabilitation at [facility] but with further discussion it sounds  like she's really talking about outpatient physical therapy. She still doesn't seem to quite understand rehabilitation.    LOS: 6 days   [name] [name] 03/08/2016, 7:21 AM

## 2016-03-08 NOTE — Progress Notes (Signed)
Physical Therapy Treatment Patient Details Name: [name] [name] MRN: [id number] DOB: 06/30/1940 Today's Date: 03/08/2016    History of Present Illness 76 y.o. female with medical history significant of DM, hypothyroidism, HLD, HTN, COPD on home O2, and afib presenting because "I couldn't breathe."  Has had several spells in the last week. Laid down but had to get up today because it was getting worse and worse.  EMS checked O2 sats and it was 90%.  +cough - non productive.  After arrival in the ER, was able to get up 2 good chunks of mucus that were medium color.  Dx: Afib with RVR, pleural effusion s/p US guided R thoracentesis on 03/03/2016, HCAP, and COPD exacerbation    PT Comments    Pt sitting in chair and willing to participate with therapist today.  No reports of pain.  Pt able to demonstrate safe mechanics with minimal cueing for HHA with sit<>stand for safety.  Increased gait distance to 5265ft with min guard ambulating with RW, pt limited by fatigue at EOS.  O2 sat range from 87-93% during session.  No reports of pain through session, pt left in chair at EOS.    Follow Up Recommendations        Equipment Recommendations       Recommendations for Other Services       Precautions / Restrictions Precautions Precautions: Fall Precaution Comments: due to immobility and recent hospitalizations.  Restrictions Weight Bearing Restrictions: No    Mobility  Bed Mobility               General bed mobility comments: pt sitting in chair upon entrance  Transfers Overall transfer level: Modified independent Equipment used: Rolling walker (2 wheeled) Transfers: Sit to/from Stand Sit to Stand: Min guard         General transfer comment: cueing for hand placement with sit to stand  Ambulation/Gait Ambulation/Gait assistance: Min guard Ambulation Distance (Feet): 65 Feet Assistive device: Rolling walker (2 wheeled) Gait Pattern/deviations: Step-to pattern   Gait velocity  interpretation: <1.8 ft/sec, indicative of risk for recurrent falls General Gait Details: Gait distance limited due to fatigue.  O2 sat range from 93-87% with 4L O2 A   Stairs            Wheelchair Mobility    Modified Rankin (Stroke Patients Only)       Balance                                    Cognition Arousal/Alertness: Awake/alert Behavior During Therapy: WFL for tasks assessed/performed Overall Cognitive Status: Within Functional Limits for tasks assessed                      Exercises      General Comments        Pertinent Vitals/Pain Pain Assessment: No/denies pain    Home Living                      Prior Function            PT Goals (current goals can now be found in the care plan section)      Frequency           PT Plan Current plan remains appropriate    Co-evaluation             End of Session Equipment Utilized  During Treatment: Gait belt;Oxygen Activity Tolerance: Patient limited by fatigue Patient left: in chair;with call bell/phone within reach     Time: 1400-1418 PT Time Calculation (min) (ACUTE ONLY): 18 min  Charges:  $Gait Training: 8-22 mins $Therapeutic Activity: 8-22 mins                    G Codes:     Becky Sax, LPTA; CBIS [phone] Juel Burrow 03/08/2016, 2:25 PM

## 2016-03-08 NOTE — Progress Notes (Addendum)
Daughter here to find out about possible placement or discharge to home. Contacted CM for patient as she was suppose to come talk with them at bedside so the patient would be included. Family member was told the CM was too busy and if she needed to speak to her in person she would have to come to her office. Family and patient were very disappointed in the way they were treated.

## 2016-03-08 NOTE — Progress Notes (Signed)
[facility] All pertinent data faxed to facility and transport has been called. IV has been removed and patient is dressed and ready to go when transport arrives.

## 2016-03-08 NOTE — Clinical Social Work Placement (Signed)
   CLINICAL SOCIAL WORK PLACEMENT  NOTE  Date:  03/08/2016  Patient Details  Name: [name] [name] Stellmach MRN: [id number] Date of Birth: 05/25/1940  Clinical Social Work is seeking post-discharge placement for this patient at the Skilled  Nursing Facility level of care (*CSW will initial, date and re-position this form in  chart as items are completed):  Yes   Patient/family provided with [facility] Clinical Social Work Department's list of facilities offering this level of care within the geographic area requested by the patient (or if unable, by the patient's family).  Yes   Patient/family informed of their freedom to choose among providers that offer the needed level of care, that participate in Medicare, Medicaid or managed care program needed by the patient, have an available bed and are willing to accept the patient.  Yes   Patient/family informed of [facility]'s ownership interest in [facility] and [facility], as well as of the fact that they are under no obligation to receive care at these facilities.  PASRR submitted to EDS on 03/08/16     PASRR number received on 03/08/16     Existing PASRR number confirmed on       FL2 transmitted to all facilities in geographic area requested by pt/family on 03/08/16     FL2 transmitted to all facilities within larger geographic area on       Patient informed that his/her managed care company has contracts with or will negotiate with certain facilities, including the following:        Yes   Patient/family informed of bed offers received.  [facility]     Physician recommends and patient chooses bed at      [facility] on 03/08/16.  Patient to be transferred to facility by [facility]     Patient family notified on 03/08/16 of transfer.  Name of family member notified:  Mrs. Alverson, daughter     PHYSICIAN       Additional Comment: CSW signing  off.   _______________________________________________ Annice NeedySettle, Zarielle Cea D, LCSW 03/08/2016, 3:28 PM

## 2016-03-08 NOTE — Clinical Social Work Note (Signed)
CSW received a call from patient's nurse requesting that patient CSW come and speak with patient/family regarding SNF. CSW advised that she could not come at this moment and that it would be some time before she was able to come to the unit. CSW spoke with patient's daughter, [name] [name], and advised that if she needed to meet immediately, she could come to [place] office. [name] [name] was agreeable.   CSW met with patient's daughter, [name] [name], in [place] office at approximately 11:15. [name] [name] advised that at baseline, patient ambulates with a walker and lives with her son.  [name] [name] lives next door. She stated that patient requires some assistance with ADLs and that [facility] comes out 2-3 times per week.   CSW provided SNF list. [name] [name] advised that she would speak with patient and contact CSW. Mr. [name] contacted CSW and advised that patient wanted to go to [facility]. CSW requested additional facilities and family declined. CSW discussed that if patient discharged and [facility] could not make bed offer, she would have to go home at discharge. [name] [name] advised that it would be most beneficial if patient discharged tomorrow as she had a lot to do today. CSW advised that discharge would be determined by doctor.     Jayten Gabbard, Clydene Pugh, LCSW

## 2016-03-16 ENCOUNTER — Ambulatory Visit: Payer: Medicare Other | Admitting: Cardiology

## 2016-03-24 ENCOUNTER — Telehealth: Payer: Self-pay | Admitting: Cardiology

## 2016-03-24 NOTE — Telephone Encounter (Signed)
Ms Ulysees Barnslverson walked in requesting to see any doctor.  Her [facility] and [facility], but [facility].  [facility] told her that [facility].  Daughter is very upset and would like to see if Dr Diona BrownerMcDowell can help in anyway in getting her mom [facility].   Does not want [facility] since she is a patient here.

## 2016-03-24 NOTE — Telephone Encounter (Signed)
Returned call to daughter.  Explained to her that our doctors do not see patient's at [facility] and have no say in treating them while they are there.  That is why we usually suggest [facility] or [facility] as a first choice.  Daughter stated that her sats were so low that they took her to closest facility.  Informed her that was the correct choice anyway in an emergent situation.  Suggested she speak with the nursing supervisor to voice her concerns.  She verbalized understanding.

## 2016-03-31 DEATH — deceased

## 2016-05-11 ENCOUNTER — Ambulatory Visit: Payer: Medicare Other | Admitting: Cardiology

## 2018-01-22 IMAGING — CT CT CHEST W/ CM
2 of 3 series · 15 of 36 positions shown, 18 images · IV contrast (iopamidol)
Comparison: Chest x-ray of 11/25/2015 and 09/07/2015

CLINICAL DATA: Abdominal chest x-ray, wheezing, low oxygen
saturation

EXAM:
CT CHEST WITH CONTRAST
TECHNIQUE: Multidetector CT imaging of the chest was performed during
intravenous contrast administration.
CONTRAST:  75mL [id number] IOPAMIDOL (4539NL-VSS) INJECTION 61%

[Series 2: axial st · axial · 0.71mm/px · z∈[+878,+1120]mm · 12 of 143 slices shown, 15 images]
[im 11/143  mediastinal]
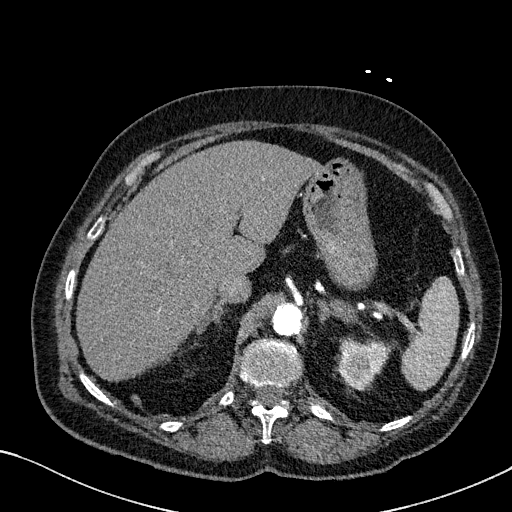
[im 11/143  lung]
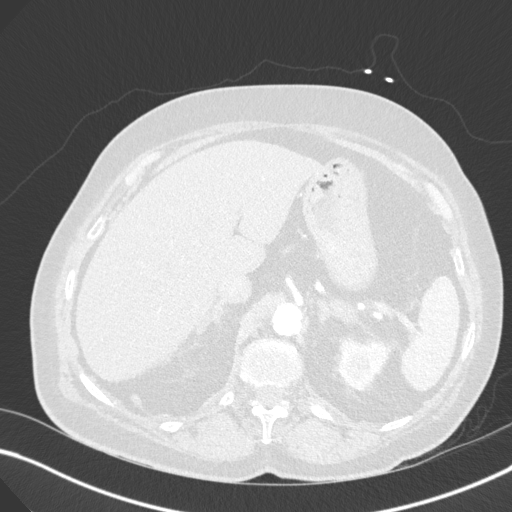
[im 22/143  lung]
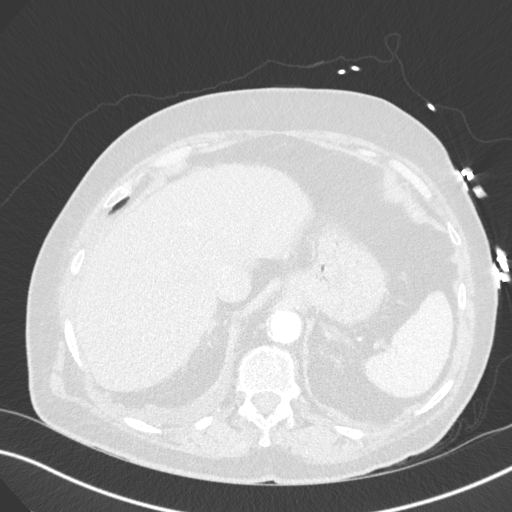
[im 32/143  lung]
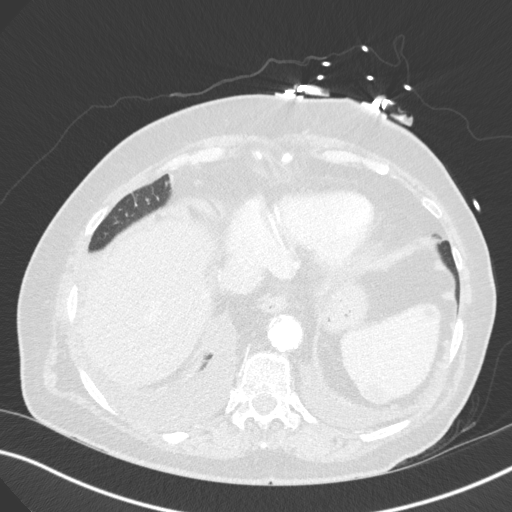
[im 43/143  lung]
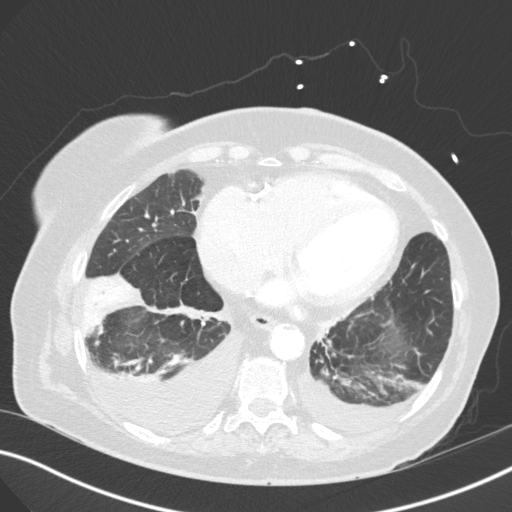
[im 53/143  mediastinal]
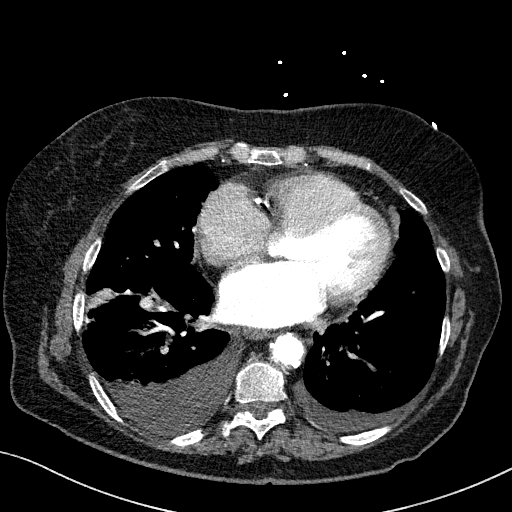
[im 53/143  lung]
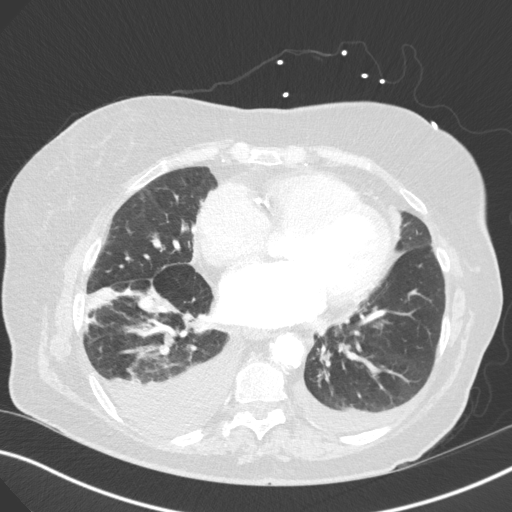
[im 64/143  lung]
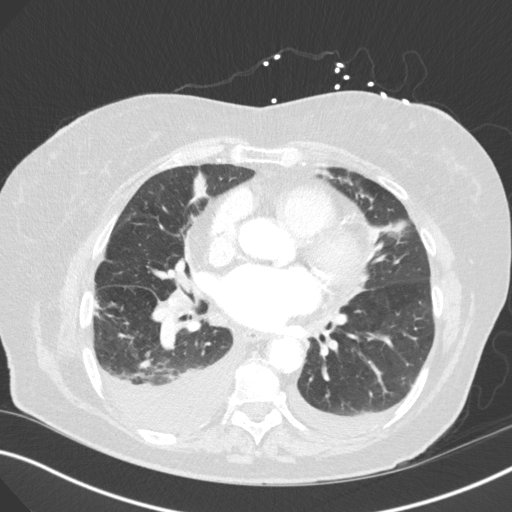
[im 79/143  lung]
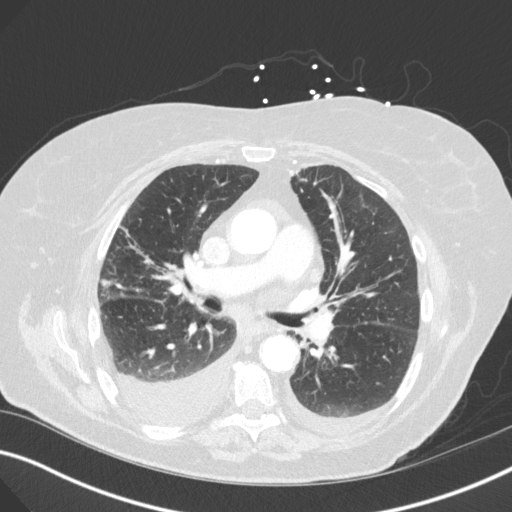
[im 90/143  lung]
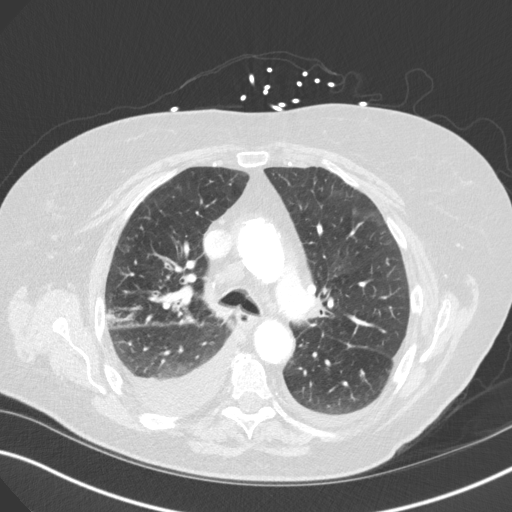
[im 100/143  mediastinal]
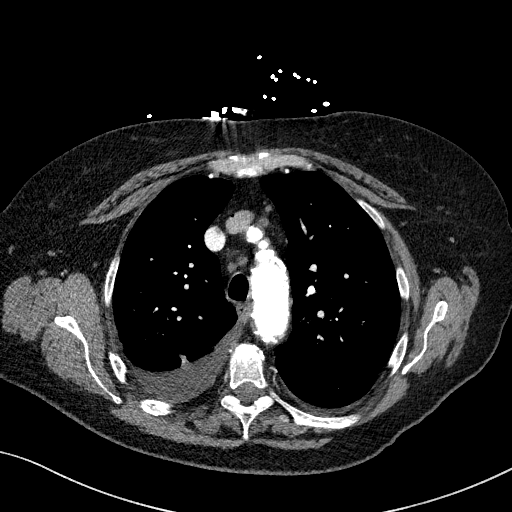
[im 100/143  lung]
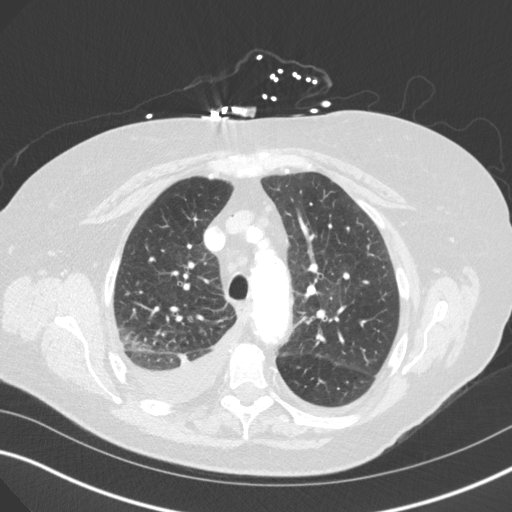
[im 111/143  lung]
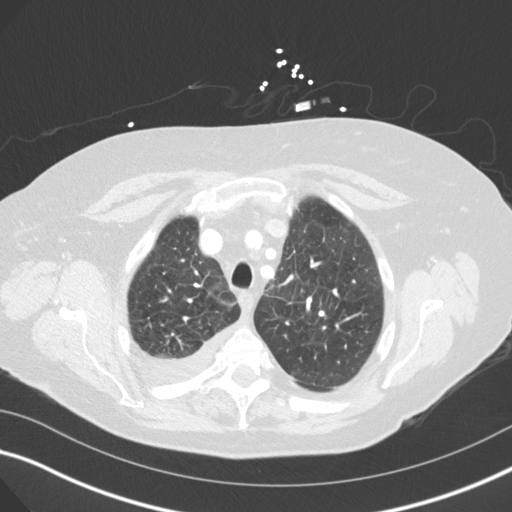
[im 121/143  lung]
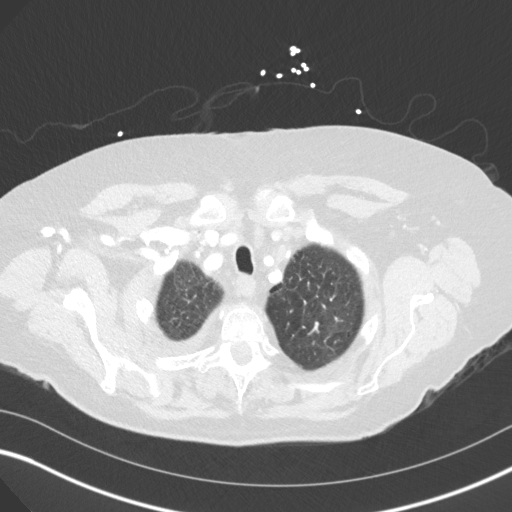
[im 132/143  lung]
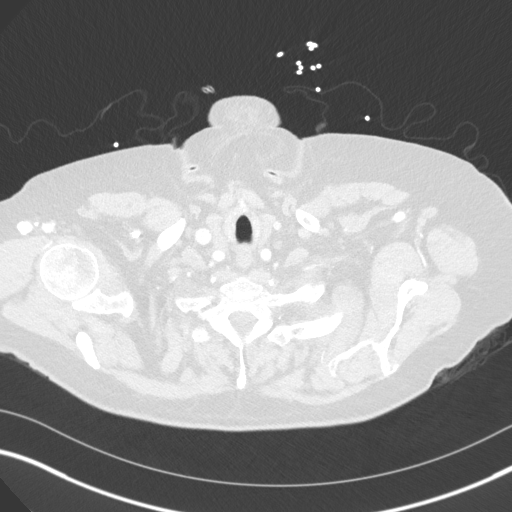

[Series 5: coronal · coronal · 0.60mm/px · 3 of 134 slices shown]
[im 27/134  lung]
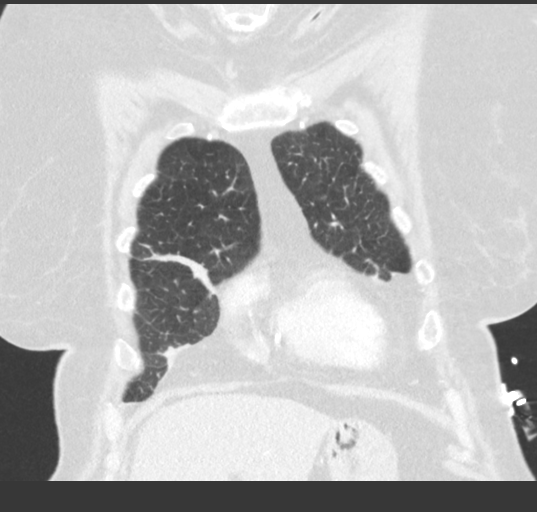
[im 54/134  lung]
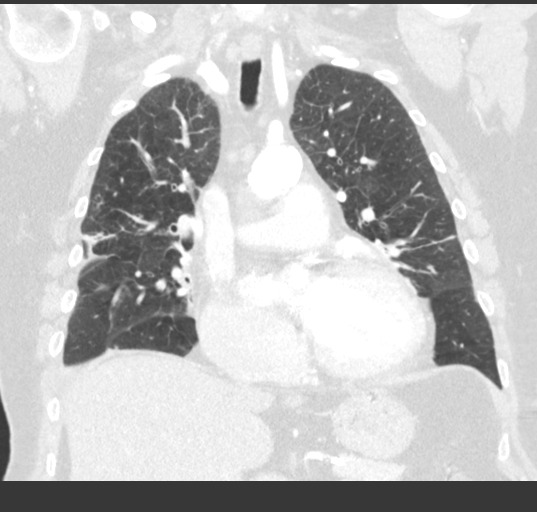
[im 80/134  lung]
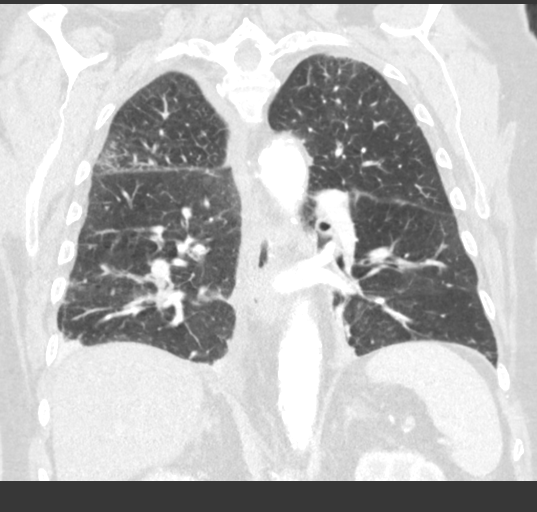

[15 of 36 positions shown; findings below may reference images not displayed]

FINDINGS: Cardiovascular: The heart is mildly enlarged. There are diffuse
coronary artery calcifications present. No pericardial effusion is
seen. The mid ascending thoracic aorta measures 32 mm in diameter.
The pulmonary arteries opacify with no acute abnormality.

Mediastinum/Nodes: There are somewhat prominent mediastinal lymph
nodes present. A pretracheal lymph node measures 8 mm in short axis
diameter on image 48 series 2. A precarinal lymph node measures 16
mm in diameter on image 56. No definite hilar adenopathy is seen.

Lungs/Pleura: There are bilateral pleural effusions present right
greater than left. Compressive atelectasis is noted primarily in the
lower lobes right greater than left. Pneumonia cannot be excluded
particularly in the right lower lobe anterolaterally. No suspicious
lung nodule or mass is seen. Linear areas of atelectasis or scarring
in noted in the right middle lobe and lingula as well.

Upper Abdomen: A probable left adrenal adenoma is present.
Low-attenuation splenic lesions are present which may represent
incidental cysts or hemangiomas but are difficult to assess on this
CT of the chest.

Musculoskeletal: No compression deformity of the thoracic spine is
seen. The sternum is intact.
IMPRESSION: 1. Severe thoracic aortic atherosclerosis diffusely.
2. Diffuse coronary artery calcifications.
3. Slightly prominent mediastinal lymph nodes. Possibly inflammatory
or infectious, but a neoplastic process cannot be excluded.
4. Probable compressive atelectasis right greater than left at the
lung bases, but pneumonia cannot be excluded.
5. Low-attenuation splenic lesions may represent cysts or
hemangiomas but are not well assessed on this CT of the chest.
6. Bilateral pleural effusions right greater than left.

## 2018-01-22 IMAGING — DX DG CHEST 2V
2 series · 2 of 2 positions shown · non-contrast
Comparison: 09/07/2015 and back to 01/12/2015.

CLINICAL DATA: Short of breath for 1 month.  Worse this morning.

EXAM:
CHEST  2 VIEW

[chest pa]
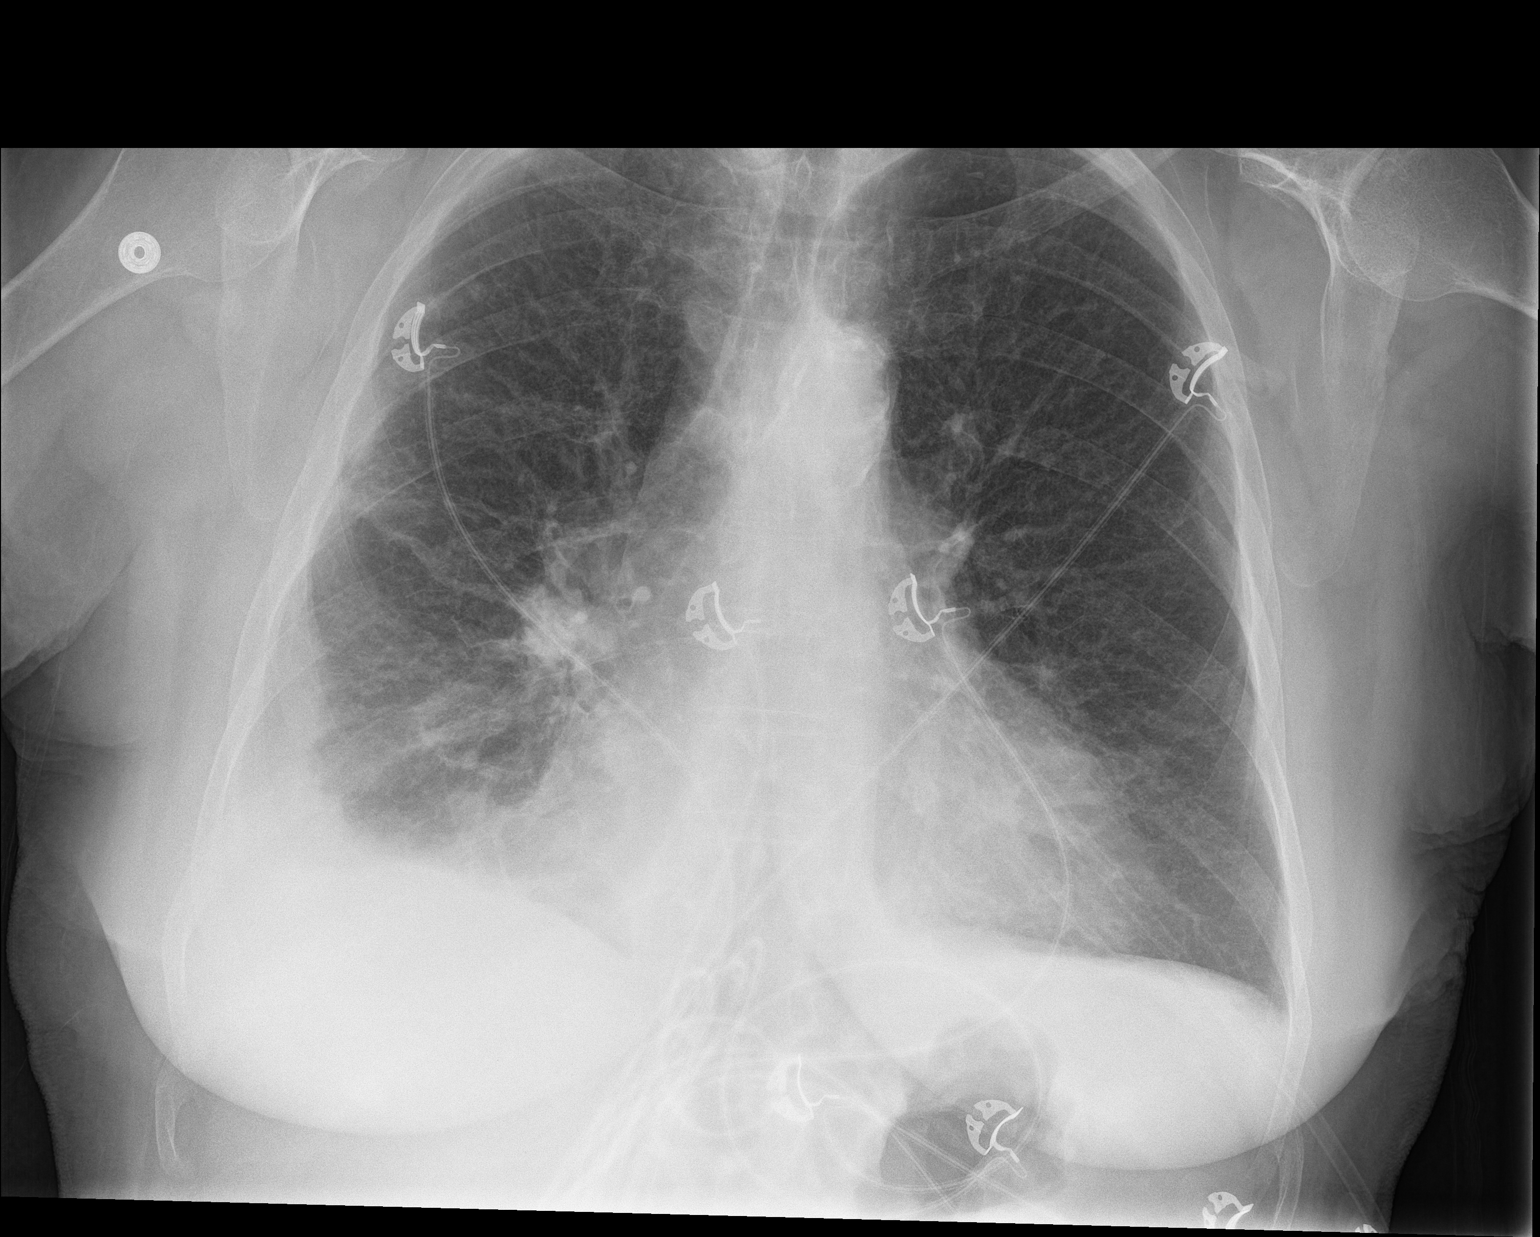

[chest lat]
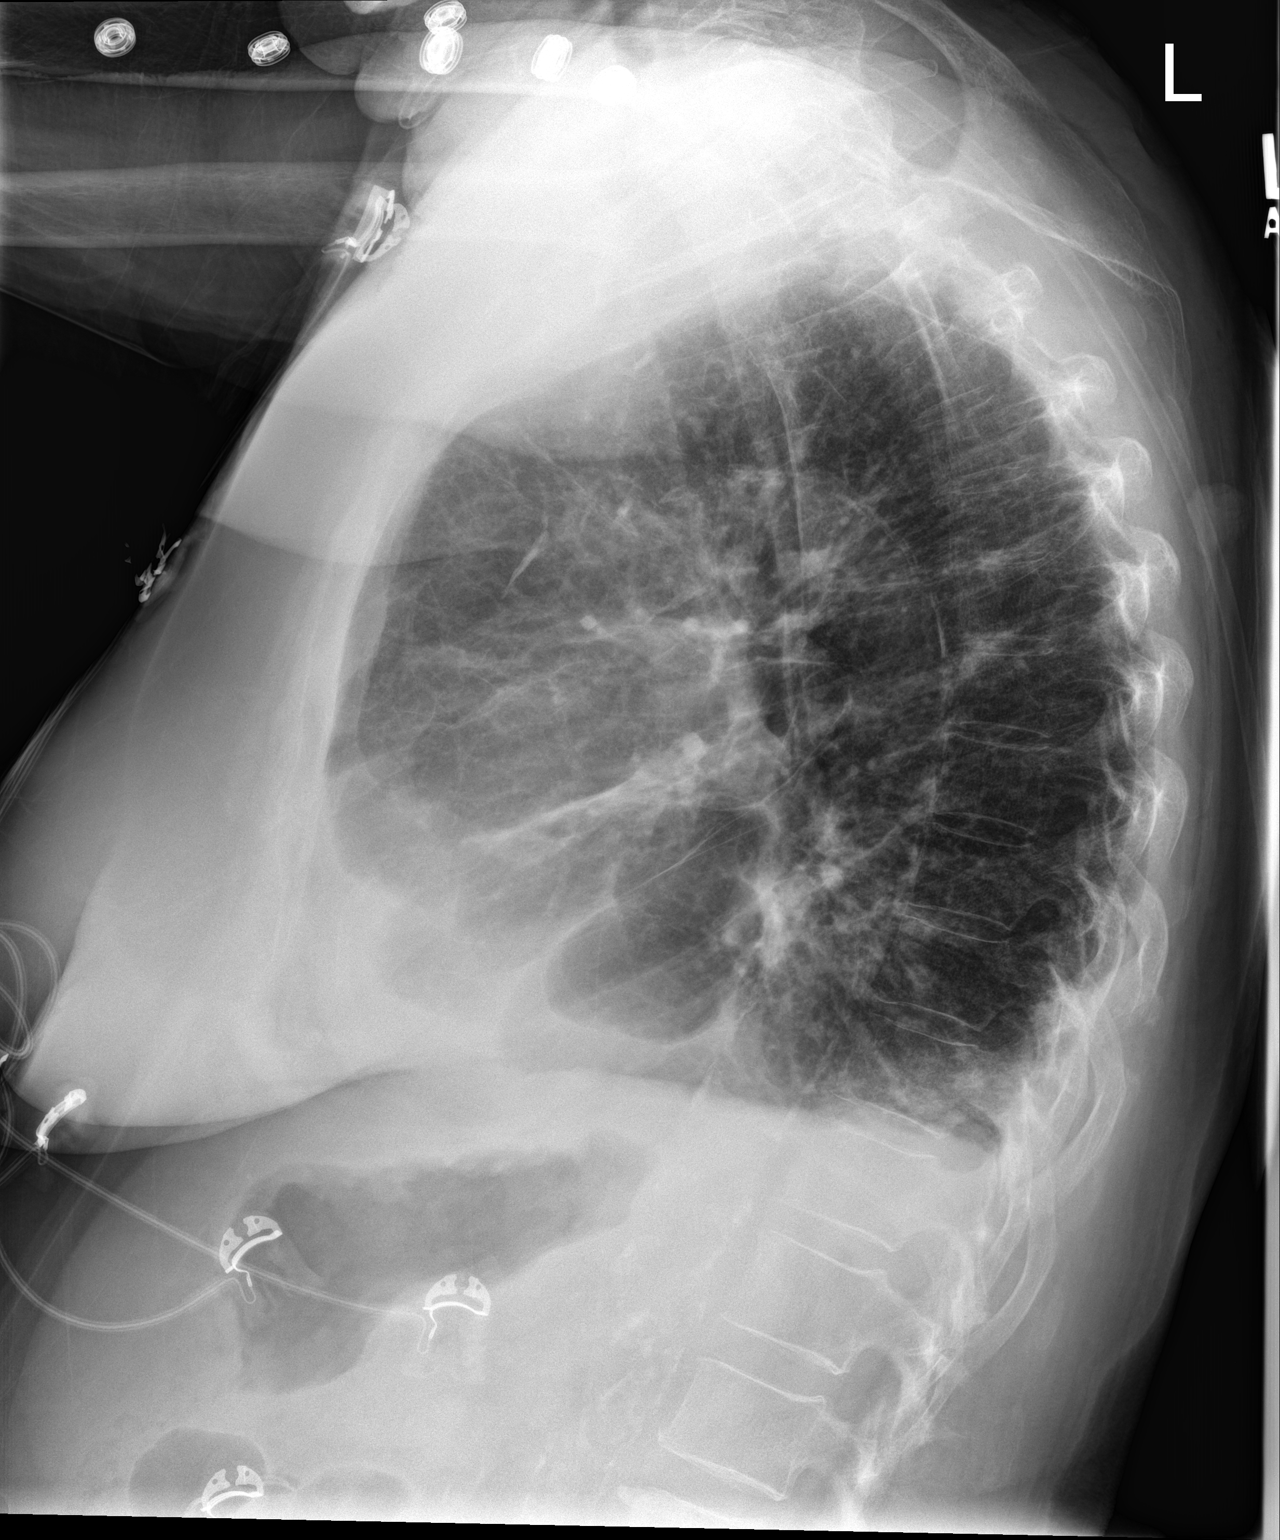

[2 of 2 positions shown; findings below may reference images not displayed]

FINDINGS: Lateral view degraded by patient arm position. Midline trachea. Mild
cardiomegaly. Atherosclerosis in the transverse aorta. Small right
pleural effusion is similar to on the prior exam. No pneumothorax.
No left-sided pleural fluid. Patchy right base airspace disease is
slightly decreased since the prior. there may be inferior right
upper lobe pulmonary opacity. Clear left lung. Pulmonary
interstitial thickening is lower lobe predominant.
IMPRESSION: 1. Since 09/07/2015, persistent or recurrent right-sided pleural
fluid with improved right base airspace disease. Possible inferior
right upper lobe airspace disease. If the patient has infectious
symptoms, this could represent recurrent or residual pneumonia. If
this is the case, then follow-up radiographs are recommended in 3-4
weeks after appropriate antibiotic therapy to confirm resolution.
Especially if there are not infectious symptoms, an underlying
obstructive lesion would be a concern and CT (ideally
contrast-enhanced) suggested.
2.  Aortic atherosclerosis.
3. Peribronchial thickening which may relate to chronic bronchitis
or smoking.
